# Patient Record
Sex: Female | Born: 1970
Health system: Southern US, Community
[De-identification: ages and names within clinical notes are randomized; demographics above are authoritative.]

## PROBLEM LIST (undated history)

## (undated) ENCOUNTER — Ambulatory Visit: Admission: EM | Source: Home / Self Care

## (undated) ENCOUNTER — Ambulatory Visit: Admission: EM | Payer: 59 | Source: Home / Self Care

## (undated) DIAGNOSIS — J45909 Unspecified asthma, uncomplicated: Secondary | ICD-10-CM

## (undated) DIAGNOSIS — E079 Disorder of thyroid, unspecified: Secondary | ICD-10-CM

## (undated) DIAGNOSIS — M1990 Unspecified osteoarthritis, unspecified site: Secondary | ICD-10-CM

## (undated) DIAGNOSIS — I10 Essential (primary) hypertension: Secondary | ICD-10-CM

## (undated) DIAGNOSIS — J449 Chronic obstructive pulmonary disease, unspecified: Secondary | ICD-10-CM

## (undated) DIAGNOSIS — F32A Depression, unspecified: Secondary | ICD-10-CM

## (undated) DIAGNOSIS — J439 Emphysema, unspecified: Secondary | ICD-10-CM

## (undated) DIAGNOSIS — R7303 Prediabetes: Secondary | ICD-10-CM

## (undated) DIAGNOSIS — E119 Type 2 diabetes mellitus without complications: Secondary | ICD-10-CM

## (undated) DIAGNOSIS — M329 Systemic lupus erythematosus, unspecified: Secondary | ICD-10-CM

## (undated) DIAGNOSIS — Z803 Family history of malignant neoplasm of breast: Secondary | ICD-10-CM

## (undated) DIAGNOSIS — IMO0002 Reserved for concepts with insufficient information to code with codable children: Secondary | ICD-10-CM

## (undated) DIAGNOSIS — M797 Fibromyalgia: Secondary | ICD-10-CM

## (undated) HISTORY — DX: Unspecified asthma, uncomplicated: J45.909

## (undated) HISTORY — DX: Fibromyalgia: M79.7

## (undated) HISTORY — DX: Type 2 diabetes mellitus without complications: E11.9

## (undated) HISTORY — DX: Emphysema, unspecified: J43.9

## (undated) HISTORY — DX: Depression, unspecified: F32.A

## (undated) HISTORY — PX: MOUTH SURGERY: SHX715

## (undated) HISTORY — DX: Family history of malignant neoplasm of breast: Z80.3

---

## 2015-10-11 DIAGNOSIS — E559 Vitamin D deficiency, unspecified: Secondary | ICD-10-CM | POA: Insufficient documentation

## 2018-03-13 DIAGNOSIS — M5412 Radiculopathy, cervical region: Secondary | ICD-10-CM | POA: Insufficient documentation

## 2019-02-10 DIAGNOSIS — M0579 Rheumatoid arthritis with rheumatoid factor of multiple sites without organ or systems involvement: Secondary | ICD-10-CM | POA: Insufficient documentation

## 2019-02-10 DIAGNOSIS — M797 Fibromyalgia: Secondary | ICD-10-CM | POA: Insufficient documentation

## 2019-02-10 DIAGNOSIS — M359 Systemic involvement of connective tissue, unspecified: Secondary | ICD-10-CM | POA: Insufficient documentation

## 2019-02-14 DIAGNOSIS — Z79899 Other long term (current) drug therapy: Secondary | ICD-10-CM | POA: Insufficient documentation

## 2019-04-29 ENCOUNTER — Emergency Department
Admission: EM | Admit: 2019-04-29 | Discharge: 2019-04-29 | Disposition: A | Discharge status: Home / Self Care | Payer: Medicare Other | Attending: Student in an Organized Health Care Education/Training Program | Admitting: Student in an Organized Health Care Education/Training Program

## 2019-04-29 ENCOUNTER — Other Ambulatory Visit: Payer: Self-pay

## 2019-04-29 ENCOUNTER — Encounter: Payer: Self-pay | Admitting: Emergency Medicine

## 2019-04-29 ENCOUNTER — Emergency Department: Payer: Medicare Other

## 2019-04-29 DIAGNOSIS — I10 Essential (primary) hypertension: Secondary | ICD-10-CM

## 2019-04-29 DIAGNOSIS — M9981 Other biomechanical lesions of cervical region: Secondary | ICD-10-CM | POA: Insufficient documentation

## 2019-04-29 DIAGNOSIS — J449 Chronic obstructive pulmonary disease, unspecified: Secondary | ICD-10-CM | POA: Diagnosis not present

## 2019-04-29 DIAGNOSIS — M79602 Pain in left arm: Secondary | ICD-10-CM | POA: Diagnosis not present

## 2019-04-29 DIAGNOSIS — F17200 Nicotine dependence, unspecified, uncomplicated: Secondary | ICD-10-CM | POA: Diagnosis not present

## 2019-04-29 DIAGNOSIS — R202 Paresthesia of skin: Secondary | ICD-10-CM | POA: Insufficient documentation

## 2019-04-29 DIAGNOSIS — M4802 Spinal stenosis, cervical region: Secondary | ICD-10-CM

## 2019-04-29 HISTORY — DX: Disorder of thyroid, unspecified: E07.9

## 2019-04-29 HISTORY — DX: Unspecified osteoarthritis, unspecified site: M19.90

## 2019-04-29 HISTORY — DX: Chronic obstructive pulmonary disease, unspecified: J44.9

## 2019-04-29 HISTORY — DX: Systemic lupus erythematosus, unspecified: M32.9

## 2019-04-29 HISTORY — DX: Reserved for concepts with insufficient information to code with codable children: IMO0002

## 2019-04-29 HISTORY — DX: Essential (primary) hypertension: I10

## 2019-04-29 LAB — CBC
HCT: 42.8 % (ref 36.0–46.0)
Hemoglobin: 13.9 g/dL (ref 12.0–15.0)
MCH: 30.8 pg (ref 26.0–34.0)
MCHC: 32.5 g/dL (ref 30.0–36.0)
MCV: 94.9 fL (ref 80.0–100.0)
Platelets: 345 10*3/uL (ref 150–400)
RBC: 4.51 MIL/uL (ref 3.87–5.11)
RDW: 14.1 % (ref 11.5–15.5)
WBC: 7.3 10*3/uL (ref 4.0–10.5)
nRBC: 0 % (ref 0.0–0.2)

## 2019-04-29 LAB — BASIC METABOLIC PANEL
Anion gap: 7 (ref 5–15)
BUN: 14 mg/dL (ref 6–20)
CO2: 24 mmol/L (ref 22–32)
Calcium: 9.1 mg/dL (ref 8.9–10.3)
Chloride: 109 mmol/L (ref 98–111)
Creatinine, Ser: 0.34 mg/dL — ABNORMAL LOW (ref 0.44–1.00)
GFR calc Af Amer: >60 mL/min (ref >60)
GFR calc non Af Amer: >60 mL/min (ref >60)
Glucose, Bld: 120 mg/dL — ABNORMAL HIGH (ref 70–99)
Potassium: 3.4 mmol/L — ABNORMAL LOW (ref 3.5–5.1)
Sodium: 140 mmol/L (ref 135–145)

## 2019-04-29 LAB — TROPONIN I (HIGH SENSITIVITY): Troponin I (High Sensitivity): 4 ng/L (ref <18)

## 2019-04-29 MED ORDER — SODIUM CHLORIDE 0.9% FLUSH: 3.0000 mL | Freq: Once | INTRAVENOUS | Status: DC

## 2019-04-29 MED ORDER — PREDNISONE 10 MG PO TABS: ORAL_TABLET | ORAL | 0 refills | Status: DC

## 2019-04-29 MED ORDER — LORAZEPAM 1 MG PO TABS
1.0000 mg | ORAL_TABLET | Freq: Once | ORAL | Status: AC
  Administered 2019-04-29: 1 mg via ORAL
  Filled 2019-04-29: qty 1

## 2019-04-29 MED ORDER — LORAZEPAM 1 MG PO TABS: 1.0000 mg | ORAL_TABLET | Freq: Three times a day (TID) | ORAL | 0 refills | Status: AC | PRN

## 2019-04-29 NOTE — ED Provider Notes (Signed)
[facility] Provider Note    First MD Initiated Contact with Patient 04/29/19 1143     (approximate)  I have reviewed the triage vital signs and the nursing notes.   HISTORY  Chief Complaint Hypertension    HPI Catherine Giles is a 48 y.o. female the below listed past medical history presents the ER for complaint and concern over elevated blood pressure as well as tingling and pain in her left hand that is been there for over 10 months.  States that she intermittently has some neck pain.  States she was recently started on losartan and has been having intermittent elevated blood pressure levels which is also concerning her..  She is also admitting to being very stressed out just recently having moved from [place]  And feels overwhelmed by her chronic medical conditions and adjusting to new home..  She denies any chest pain or pressure.  Does have some nausea from time to time.  Denies any abdominal pain.  She  Is established with rheumatology.    Past Medical History:  Diagnosis Date  . Arthritis    RA  . COPD (chronic obstructive pulmonary disease) ([place])   . Hypertension   . Lupus ([place])   . Thyroid disease    No family history on file. History reviewed. No pertinent surgical history. There are no active problems to display for this patient.     Prior to Admission medications   Medication Sig Start Date End Date Taking? Authorizing Provider  LORazepam (ATIVAN) 1 MG tablet Take 1 tablet (1 mg total) by mouth every 8 (eight) hours as needed for anxiety. 04/29/19 04/28/20  Merlyn Lot, MD  predniSONE (DELTASONE) 10 MG tablet Day 1-2: Take 50mg (5 pills) Day 3-4: Take 40mg (4) Day 5-6: 30mg (3) Day 7-8: 20mg (2) Day 9:10mg (1 04/29/19   Merlyn Lot, MD    Allergies Penicillins and Shellfish allergy    Social History Social History   Tobacco Use  . Smoking status: Current Every Day Smoker  . Smokeless tobacco: Never Used   Substance Use Topics  . Alcohol use: Not on file  . Drug use: Not on file    Review of Systems Patient denies headaches, rhinorrhea, blurry vision, numbness, shortness of breath, chest pain, edema, cough, abdominal pain, nausea, vomiting, diarrhea, dysuria, fevers, rashes or hallucinations unless otherwise stated above in HPI. ____________________________________________   PHYSICAL EXAM:  VITAL SIGNS: Vitals:   04/29/19 1030  BP: (!) 133/98  Pulse: 90  Resp: 18  Temp: 98.6 F (37 C)  SpO2: 99%    Constitutional: Alert and oriented.  Eyes: Conjunctivae are normal.  Head: Atraumatic. Nose: No congestion/rhinnorhea. Mouth/Throat: Mucous membranes are moist.   Neck: No stridor. Painless ROM.  Cardiovascular: Normal rate, regular rhythm. Grossly normal heart sounds.  Good peripheral circulation in all four extremities Respiratory: Normal respiratory effort.  No retractions. Lungs CTAB. Gastrointestinal: Soft and nontender. No distention. No abdominal bruits. No CVA tenderness. Genitourinary:  Musculoskeletal: No lower extremity tenderness nor edema.  No joint effusions. Neurologic:  CN- intact.  No facial droop, Normal FNF.  Normal heel to shin.  slightly decreased grip strenght on the left.Sensation intact bilaterally. Normal speech and language. No gross focal neurologic deficits are appreciated. No gait instability. Skin:  Skin is warm, dry and intact. No rash noted. Psychiatric: tearful and anxious appearing. Speech and behavior are normal.  ____________________________________________   LABS (all labs ordered are listed, but only abnormal results are displayed)  Results for orders  placed or performed during the Giles encounter of 04/29/19 (from the past 24 hour(s))  Basic metabolic panel     Status: Abnormal   Collection Time: 04/29/19 10:36 AM  Result Value Ref Range   Sodium 140 135 - 145 mmol/L   Potassium 3.4 (L) 3.5 - 5.1 mmol/L   Chloride 109 98 - 111 mmol/L    CO2 24 22 - 32 mmol/L   Glucose, Bld 120 (H) 70 - 99 mg/dL   BUN 14 6 - 20 mg/dL   Creatinine, Ser 0.34 (L) 0.44 - 1.00 mg/dL   Calcium 9.1 8.9 - 10.3 mg/dL   GFR calc non Af Amer >60 >60 mL/min   GFR calc Af Amer >60 >60 mL/min   Anion gap 7 5 - 15  CBC     Status: None   Collection Time: 04/29/19 10:36 AM  Result Value Ref Range   WBC 7.3 4.0 - 10.5 K/uL   RBC 4.51 3.87 - 5.11 MIL/uL   Hemoglobin 13.9 12.0 - 15.0 g/dL   HCT 42.8 36.0 - 46.0 %   MCV 94.9 80.0 - 100.0 fL   MCH 30.8 26.0 - 34.0 pg   MCHC 32.5 30.0 - 36.0 g/dL   RDW 14.1 11.5 - 15.5 %   Platelets 345 150 - 400 K/uL   nRBC 0.0 0.0 - 0.2 %  Troponin I (High Sensitivity)     Status: None   Collection Time: 04/29/19 10:36 AM  Result Value Ref Range   Troponin I (High Sensitivity) 4 <18 ng/L   ____________________________________________  EKG My review and personal interpretation at Time: 10:37   Indication: htn  Rate: 90  Rhythm: sinus Axis: normal Other: normal intervals, no stemi ____________________________________________  RADIOLOGY  I personally reviewed all radiographic images ordered to evaluate for the above acute complaints and reviewed radiology reports and findings.  These findings were personally discussed with the patient.  Please see medical record for radiology report.  ____________________________________________   PROCEDURES  Procedure(s) performed:  Procedures    Critical Care performed: no ____________________________________________   INITIAL IMPRESSION / ASSESSMENT AND PLAN / ED COURSE  Pertinent labs & imaging results that were available during my care of the patient were reviewed by me and considered in my medical decision making (see chart for details).   DDX: Radiculopathy, cervical strain, fibromyalgia, ACS, dissection, arthritis, hypertensive urgency, anxiety  Catherine Giles is a 48 y.o. who presents to the ED with symptoms as described above.  Patient well-appearing in  no acute distress but is very anxious and tearful.  Seems to be having quite a great deal of underlying stress at home.  EKG does not show any evidence of acute ischemia and her troponin is negative.  The symptoms all seem to be quite chronic in nature reporting symptoms started over 10 months ago but does not have a local PCP or primary care.  Is not consistent with dissection.  Given the radicular nature will order CT imaging to exclude any evidence of fracture as she did have intermittent neck pain and also evaluate for any significant stenosis.  Symptoms are otherwise fairly mild and would seem appropriate for outpatient follow-up assuming CT is reassuring.  Clinical Course as of Apr 29 1315  Tue Apr 29, 2019  1305 Reassessed.  Discussed the findings of imaging and results of her blood work.  Given duration of symptoms I believe she is appropriate for further work-up as an outpatient.  Does not have any evidence of acute  pathology.  We discussed signs and symptoms for which she should return to the ER.  Discussed appropriate follow-up.  Will start on steroid taper.  Had significant improvement with Ativan therefore will give small prescription for this.   [PR]    Clinical Course User Index [PR] Merlyn Lot, MD    The patient was evaluated in Emergency Department today for the symptoms described in the history of present illness. He/she was evaluated in the context of the global COVID-19 pandemic, which necessitated consideration that the patient might be at risk for infection with the SARS-CoV-2 virus that causes COVID-19. Institutional protocols and algorithms that pertain to the evaluation of patients at risk for COVID-19 are in a state of rapid change based on information released by regulatory bodies including the CDC and federal and state organizations. These policies and algorithms were followed during the patient's care in the ED.   As part of my medical decision making, I reviewed the  following data within the [facility] notes reviewed and incorporated, Labs reviewed, notes from prior ED visits and [place] Controlled Substance Database   ____________________________________________   FINAL CLINICAL IMPRESSION(S) / ED DIAGNOSES  Final diagnoses:  Neural foraminal stenosis of cervical spine  Hypertension, unspecified type  Left arm pain      NEW MEDICATIONS STARTED DURING THIS VISIT:  New Prescriptions   LORAZEPAM (ATIVAN) 1 MG TABLET    Take 1 tablet (1 mg total) by mouth every 8 (eight) hours as needed for anxiety.   PREDNISONE (DELTASONE) 10 MG TABLET    Day 1-2: Take 50mg (5 pills) Day 3-4: Take 40mg (4) Day 5-6: 30mg (3) Day 7-8: 20mg (2) Day 9:10mg (1     Note:  This document was prepared using Dragon voice recognition software and may include unintentional dictation errors.    Merlyn Lot, MD 04/29/19 1316

## 2019-04-29 NOTE — ED Triage Notes (Signed)
C/O hypertension 'for months"  Currently takes BP medication.  Last taken at 0500.   Patient is anxious.  AAOx3.  MAE equally and strong.    Also c/o left hand numbness and tingling x several years.

## 2019-04-29 NOTE — ED Notes (Signed)
Pt a/o, NAD. In 1H

## 2019-05-05 DIAGNOSIS — G8929 Other chronic pain: Secondary | ICD-10-CM | POA: Insufficient documentation

## 2019-05-05 DIAGNOSIS — M4722 Other spondylosis with radiculopathy, cervical region: Secondary | ICD-10-CM | POA: Insufficient documentation

## 2019-05-05 DIAGNOSIS — R200 Anesthesia of skin: Secondary | ICD-10-CM | POA: Insufficient documentation

## 2019-05-05 DIAGNOSIS — R202 Paresthesia of skin: Secondary | ICD-10-CM | POA: Insufficient documentation

## 2019-05-20 NOTE — Progress Notes (Signed)
Patient not available in online evisit platform at appt time, or for 20 minutes afterward.  Not able to reach her by phone either. No show.

## 2019-05-21 ENCOUNTER — Encounter: Payer: Medicare Other | Admitting: Family Medicine

## 2019-05-21 ENCOUNTER — Other Ambulatory Visit: Payer: Self-pay

## 2019-05-21 DIAGNOSIS — Z91199 Patient's noncompliance with other medical treatment and regimen due to unspecified reason: Secondary | ICD-10-CM

## 2019-05-21 DIAGNOSIS — Z5329 Procedure and treatment not carried out because of patient's decision for other reasons: Secondary | ICD-10-CM

## 2019-05-22 ENCOUNTER — Inpatient Hospital Stay: Payer: Medicare Other | Attending: Oncology | Admitting: Oncology

## 2019-05-28 ENCOUNTER — Encounter: Payer: Self-pay | Admitting: Oncology

## 2019-05-28 ENCOUNTER — Inpatient Hospital Stay: Payer: Medicare Other | Attending: Oncology | Admitting: Oncology

## 2019-05-28 ENCOUNTER — Other Ambulatory Visit: Payer: Self-pay

## 2019-05-28 ENCOUNTER — Inpatient Hospital Stay: Payer: Medicare Other

## 2019-05-28 VITALS — BP 160/104 | HR 80 | Temp 98.4°F | Resp 18 | Wt 167.1 lb

## 2019-05-28 DIAGNOSIS — Z809 Family history of malignant neoplasm, unspecified: Secondary | ICD-10-CM | POA: Diagnosis not present

## 2019-05-28 DIAGNOSIS — R1310 Dysphagia, unspecified: Secondary | ICD-10-CM | POA: Insufficient documentation

## 2019-05-28 DIAGNOSIS — M1990 Unspecified osteoarthritis, unspecified site: Secondary | ICD-10-CM

## 2019-05-28 DIAGNOSIS — M059 Rheumatoid arthritis with rheumatoid factor, unspecified: Secondary | ICD-10-CM | POA: Diagnosis not present

## 2019-05-28 DIAGNOSIS — Z880 Allergy status to penicillin: Secondary | ICD-10-CM | POA: Diagnosis not present

## 2019-05-28 DIAGNOSIS — R5382 Chronic fatigue, unspecified: Secondary | ICD-10-CM | POA: Insufficient documentation

## 2019-05-28 DIAGNOSIS — R779 Abnormality of plasma protein, unspecified: Secondary | ICD-10-CM | POA: Insufficient documentation

## 2019-05-28 DIAGNOSIS — Z801 Family history of malignant neoplasm of trachea, bronchus and lung: Secondary | ICD-10-CM | POA: Diagnosis not present

## 2019-05-28 DIAGNOSIS — Z833 Family history of diabetes mellitus: Secondary | ICD-10-CM | POA: Diagnosis not present

## 2019-05-28 DIAGNOSIS — M4050 Lordosis, unspecified, site unspecified: Secondary | ICD-10-CM | POA: Diagnosis not present

## 2019-05-28 DIAGNOSIS — I10 Essential (primary) hypertension: Secondary | ICD-10-CM | POA: Insufficient documentation

## 2019-05-28 DIAGNOSIS — Z79899 Other long term (current) drug therapy: Secondary | ICD-10-CM

## 2019-05-28 DIAGNOSIS — M542 Cervicalgia: Secondary | ICD-10-CM | POA: Insufficient documentation

## 2019-05-28 DIAGNOSIS — M2578 Osteophyte, vertebrae: Secondary | ICD-10-CM | POA: Insufficient documentation

## 2019-05-28 DIAGNOSIS — M2550 Pain in unspecified joint: Secondary | ICD-10-CM | POA: Insufficient documentation

## 2019-05-28 DIAGNOSIS — F17210 Nicotine dependence, cigarettes, uncomplicated: Secondary | ICD-10-CM | POA: Diagnosis not present

## 2019-05-28 DIAGNOSIS — D472 Monoclonal gammopathy: Secondary | ICD-10-CM | POA: Diagnosis not present

## 2019-05-28 DIAGNOSIS — Z8041 Family history of malignant neoplasm of ovary: Secondary | ICD-10-CM

## 2019-05-28 DIAGNOSIS — M797 Fibromyalgia: Secondary | ICD-10-CM

## 2019-05-28 DIAGNOSIS — R200 Anesthesia of skin: Secondary | ICD-10-CM | POA: Diagnosis not present

## 2019-05-28 DIAGNOSIS — Z8249 Family history of ischemic heart disease and other diseases of the circulatory system: Secondary | ICD-10-CM | POA: Diagnosis not present

## 2019-05-28 DIAGNOSIS — M069 Rheumatoid arthritis, unspecified: Secondary | ICD-10-CM | POA: Diagnosis not present

## 2019-05-28 DIAGNOSIS — R202 Paresthesia of skin: Secondary | ICD-10-CM

## 2019-05-28 NOTE — Progress Notes (Signed)
New patient in for abnormal plasma proteins.

## 2019-05-29 LAB — KAPPA/LAMBDA LIGHT CHAINS
Kappa free light chain: 14.4 mg/L (ref 3.3–19.4)
Kappa, lambda light chain ratio: 1.48 (ref 0.26–1.65)
Lambda free light chains: 9.7 mg/L (ref 5.7–26.3)

## 2019-05-29 LAB — BETA 2 MICROGLOBULIN, SERUM: Beta-2 Microglobulin: 1 mg/L (ref 0.6–2.4)

## 2019-05-29 NOTE — Progress Notes (Signed)
Hematology/Oncology Consult note [facility] [phone] [phone]   Patient Care Team: Patient, No Pcp Per as PCP - General (General Practice)  REFERRING PROVIDER: Behalal-Bock, Christele*  CHIEF COMPLAINTS/REASON FOR VISIT:  Evaluation of abnormalities of plasm protein.   HISTORY OF PRESENTING ILLNESS:  Catherine Giles is a  48 y.o.  female with PMH listed below was seen in consultation at the request of  Behalal-Bock, [place]ille*  for evaluation of abnormalities of plasm protein She followed up with Dr.Bock for rheumatoid arthritis, fibromyalgia, She has elevated CCP 62, elevated rheumatoid factor 123.  She was advised to start [place].   Negative CCP, ANA, normal C3,  Work up showed abnormal protein electrophoresis, IgA monoclonal protein with light chain. Normal urine protein electrophoresis. She was referred to heme-onc for further evaluation.  She reports having ongoing pain everywhere. She reports having ongoing pain everywhere.  Moved to [place] this year. Single mom, lives with kids.    Review of Systems  Constitutional: Positive for fatigue. Negative for appetite change, chills, fever and unexpected weight change.  HENT:   Negative for hearing loss and voice change.   Eyes: Negative for eye problems.  Respiratory: Negative for chest tightness, cough and shortness of breath.   Cardiovascular: Negative for chest pain.  Gastrointestinal: Negative for abdominal distention, abdominal pain and blood in stool.  Endocrine: Negative for hot flashes.  Genitourinary: Negative for difficulty urinating and frequency.   Musculoskeletal: Positive for arthralgias and myalgias.  Skin: Negative for itching and rash.  Neurological: Negative for extremity weakness.  Hematological: Negative for adenopathy.  Psychiatric/Behavioral: Negative for confusion and depression. The patient is nervous/anxious.     MEDICAL HISTORY:  Past Medical History:   Diagnosis Date  . Arthritis    RA  . COPD (chronic obstructive pulmonary disease) ([place])   . Emphysema of lung ([place])   . Hypertension   . Lupus ([place])   . Thyroid disease     SURGICAL HISTORY: History reviewed. No pertinent surgical history.  SOCIAL HISTORY: Social History   Socioeconomic History  . Marital status: Divorced    Spouse name: Not on file  . Number of children: Not on file  . Years of education: Not on file  . Highest education level: Not on file  Occupational History  . Not on file  Social Needs  . Financial resource strain: Not on file  . Food insecurity    Worry: Not on file    Inability: Not on file  . Transportation needs    Medical: Not on file    Non-medical: Not on file  Tobacco Use  . Smoking status: Current Every Day Smoker    Packs/day: 1.00    Years: 34.00    Pack years: 34.00    Types: Cigarettes  . Smokeless tobacco: Never Used  Substance and Sexual Activity  . Alcohol use: Yes  . Drug use: Never  . Sexual activity: Yes  Lifestyle  . Physical activity    Days per week: Not on file    Minutes per session: Not on file  . Stress: Not on file  Relationships  . Social Herbalist on phone: Not on file    Gets together: Not on file    Attends religious service: Not on file    Active member of club or organization: Not on file    Attends meetings of clubs or organizations: Not on file    Relationship status: Not on file  .  Intimate partner violence    Fear of current or ex partner: Not on file    Emotionally abused: Not on file    Physically abused: Not on file    Forced sexual activity: Not on file  Other Topics Concern  . Not on file  Social History Narrative  . Not on file    FAMILY HISTORY: Family History  Problem Relation Age of Onset  . Cancer Mother   . Lung cancer Mother   . Diabetes Mother   . Hypertension Father   . Cancer Maternal Aunt   . Ovarian cancer Maternal Aunt   . Diabetes Maternal Aunt   .  Diabetes Maternal Uncle   . Hypertension Maternal Grandmother   . Diabetes Maternal Grandmother     ALLERGIES:  is allergic to penicillins and shellfish allergy.  MEDICATIONS:  Current Outpatient Medications  Medication Sig Dispense Refill  . albuterol (VENTOLIN HFA) 108 (90 Base) MCG/ACT inhaler Inhale into the lungs.    . cyclobenzaprine (FLEXERIL) 10 MG tablet Take by mouth.    . hydroxychloroquine (PLAQUENIL) 200 MG tablet 1 tab BID, 90 days, #180 tabs    . ibuprofen (ADVIL) 600 MG tablet Take by mouth.    . leflunomide (ARAVA) 20 MG tablet Take by mouth.    . levothyroxine (SYNTHROID) 50 MCG tablet Take by mouth.    Marland Kitchen LORazepam (ATIVAN) 1 MG tablet Take 1 tablet (1 mg total) by mouth every 8 (eight) hours as needed for anxiety. 10 tablet 0  . losartan (COZAAR) 100 MG tablet Take by mouth.    . predniSONE (DELTASONE) 20 MG tablet Take 20 mg by mouth daily with breakfast.    . tiZANidine (ZANAFLEX) 4 MG tablet Take by mouth.    . umeclidinium-vilanterol (ANORO ELLIPTA) 62.5-25 MCG/INH AEPB Inhale into the lungs.     No current facility-administered medications for this visit.      PHYSICAL EXAMINATION: ECOG PERFORMANCE STATUS: 1 - Symptomatic but completely ambulatory Vitals:   05/28/19 1523  BP: (!) 160/104  Pulse: 80  Resp: 18  Temp: 98.4 F (36.9 C)  SpO2: 97%   Filed Weights   05/28/19 1523  Weight: 167 lb 1.6 oz (75.8 kg)    Physical Exam Constitutional:      General: She is not in acute distress. HENT:     Head: Normocephalic and atraumatic.  Eyes:     General: No scleral icterus.    Pupils: Pupils are equal, round, and reactive to light.  Neck:     Musculoskeletal: Normal range of motion and neck supple.  Cardiovascular:     Rate and Rhythm: Normal rate and regular rhythm.     Heart sounds: Normal heart sounds.  Pulmonary:     Effort: Pulmonary effort is normal. No respiratory distress.     Breath sounds: No wheezing.  Abdominal:     General: Bowel  sounds are normal. There is no distension.     Palpations: Abdomen is soft. There is no mass.     Tenderness: There is no abdominal tenderness.  Musculoskeletal: Normal range of motion.        General: No deformity.  Skin:    General: Skin is warm and dry.     Findings: No erythema or rash.  Neurological:     Mental Status: She is alert and oriented to person, place, and time.     Cranial Nerves: No cranial nerve deficit.     Coordination: Coordination normal.  Psychiatric:  Behavior: Behavior normal.        Thought Content: Thought content normal.     LABORATORY DATA:  I have reviewed the data as listed Lab Results  Component Value Date   WBC 7.3 04/29/2019   HGB 13.9 04/29/2019   HCT 42.8 04/29/2019   MCV 94.9 04/29/2019   PLT 345 04/29/2019   Recent Labs    04/29/19 1036  NA 140  K 3.4*  CL 109  CO2 24  GLUCOSE 120*  BUN 14  CREATININE 0.34*  CALCIUM 9.1  GFRNONAA >60  GFRAA >60   Iron/TIBC/Ferritin/ %Sat No results found for: IRON, TIBC, FERRITIN, IRONPCTSAT    RADIOGRAPHIC STUDIES: I have personally reviewed the radiological images as listed and agreed with the findings in the report. Dg Chest 2 View  Result Date: 04/29/2019 CLINICAL DATA:  Hypertension EXAM: CHEST - 2 VIEW COMPARISON:  None. FINDINGS: The heart size and mediastinal contours are within normal limits. Both lungs are clear. The visualized skeletal structures are unremarkable. IMPRESSION: No active cardiopulmonary disease. Electronically Signed   By: Kathreen Devoid   On: 04/29/2019 11:18   Ct Cervical Spine Wo Contrast  Result Date: 04/29/2019 CLINICAL DATA:  Neck pain and left hand tingling with some numbness at times. EXAM: CT CERVICAL SPINE WITHOUT CONTRAST TECHNIQUE: Multidetector CT imaging of the cervical spine was performed without intravenous contrast. Multiplanar CT image reconstructions were also generated. COMPARISON:  None. FINDINGS: Alignment: Reversal of cervical lordosis. Skull  base and vertebrae: No acute fracture. No primary bone lesion or focal pathologic process. Soft tissues and spinal canal: No prevertebral fluid or swelling. No visible canal hematoma. Disc levels: Mild multilevel osteoarthritic changes with disc space narrowing, remodeling of vertebral bodies and small disc osteophyte complexes. Mild bilateral osseous neural foraminal narrowing at C4-C5, C5-C6 and C6-C7. Upper chest: Mild paraseptal emphysema in the right lung apex. Other: None. IMPRESSION: 1. No evidence of acute traumatic injury to the cervical spine. 2. Reversal of cervical lordosis, likely positional. 3. Mild multilevel osteoarthritic changes with mild bilateral osseous neural foraminal narrowing at C4-C5, C5-C6 and C6-C7. Electronically Signed   By: Fidela Salisbury M.D.   On: 04/29/2019 12:35     ASSESSMENT & PLAN:  1. Abnormal plasma protein test    Labs are reviewed and discussed with patient. No M protein detected. IFE showed monoclonal IgA protein. ? MGUS, need to confirm. I suggest to repeat multiple myeloma panel with serum protein electrolyte pheresis, light chain ratio, beta 2 microglobulin. Patient does not have primary care provider.  Will refer her to establish care with PCP.  Orders Placed This Encounter  Procedures  . Kappa/lambda light chains    Standing Status:   Future    Number of Occurrences:   1    Standing Expiration Date:   05/27/2020  . Beta 2 microglobulin, serum    Standing Status:   Future    Number of Occurrences:   1    Standing Expiration Date:   05/27/2020  . Multiple Myeloma Panel (SPEP&IFE w/QIG)    Standing Status:   Future    Number of Occurrences:   1    Standing Expiration Date:   05/27/2020    All questions were answered. The patient knows to call the clinic with any problems questions or concerns.  cc Behalal-Bock, Christele*    Return of visit:  Thank you for this kind referral and the opportunity to participate in the care of this patient.  A copy of  today's note is routed to referring provider  Total face to face encounter time for this patient visit was 45 min. >50% of the time was  spent in counseling and coordination of care.    Earlie Server, MD, PhD Hematology Oncology [facility] at [facility] Pager- [phone] 05/29/2019

## 2019-05-30 LAB — MULTIPLE MYELOMA PANEL, SERUM
Albumin SerPl Elph-Mcnc: 3.6 g/dL (ref 2.9–4.4)
Albumin/Glob SerPl: 1.3 (ref 0.7–1.7)
Alpha 1: 0.2 g/dL (ref 0.0–0.4)
Alpha2 Glob SerPl Elph-Mcnc: 0.8 g/dL (ref 0.4–1.0)
B-Globulin SerPl Elph-Mcnc: 1.2 g/dL (ref 0.7–1.3)
Gamma Glob SerPl Elph-Mcnc: 0.8 g/dL (ref 0.4–1.8)
Globulin, Total: 3 g/dL (ref 2.2–3.9)
IgA: 350 mg/dL (ref 87–352)
IgG (Immunoglobin G), Serum: 908 mg/dL (ref 586–1602)
IgM (Immunoglobulin M), Srm: 65 mg/dL (ref 26–217)
Total Protein ELP: 6.6 g/dL (ref 6.0–8.5)

## 2019-06-13 ENCOUNTER — Encounter: Payer: Self-pay | Admitting: Oncology

## 2019-06-13 ENCOUNTER — Inpatient Hospital Stay (HOSPITAL_BASED_OUTPATIENT_CLINIC_OR_DEPARTMENT_OTHER): Payer: Medicare Other | Admitting: Oncology

## 2019-06-13 ENCOUNTER — Ambulatory Visit: Payer: Medicare Other | Admitting: Oncology

## 2019-06-13 ENCOUNTER — Other Ambulatory Visit: Payer: Self-pay

## 2019-06-13 VITALS — BP 135/87 | HR 90 | Temp 96.9°F | Wt 164.1 lb

## 2019-06-13 DIAGNOSIS — M059 Rheumatoid arthritis with rheumatoid factor, unspecified: Secondary | ICD-10-CM | POA: Diagnosis not present

## 2019-06-13 DIAGNOSIS — Z0000 Encounter for general adult medical examination without abnormal findings: Secondary | ICD-10-CM | POA: Diagnosis not present

## 2019-06-13 DIAGNOSIS — D472 Monoclonal gammopathy: Secondary | ICD-10-CM | POA: Diagnosis not present

## 2019-06-13 DIAGNOSIS — R1310 Dysphagia, unspecified: Secondary | ICD-10-CM

## 2019-06-13 NOTE — Progress Notes (Signed)
Hematology/Oncology Consult note [facility] Telephone:([phone] [phone]   Patient Care Team: Patient, No Pcp Per as PCP - General (General Practice)  REFERRING PROVIDER: Behalal-Bock, Christele*  CHIEF COMPLAINTS/REASON FOR VISIT:  Evaluation of abnormalities of plasm protein.   HISTORY OF PRESENTING ILLNESS:  Catherine Giles is a  48 y.o.  female with PMH listed below was seen in consultation at the request of  Behalal-Bock, [place]*  for evaluation of abnormalities of plasm protein She followed up with Dr.Bock for rheumatoid arthritis, fibromyalgia, She has elevated CCP 62, elevated rheumatoid factor 123.  She was advised to start J[place]uld.   Negative CCP, ANA, normal C3,  Work up showed abnormal protein electrophoresis, IgA monoclonal protein with light chain. Normal urine protein electrophoresis. She was referred to heme-onc for further evaluation.  She reports having ongoing pain everywhere. She reports having ongoing pain everywhere.  Moved to [place] this year. Single mom, lives with kids.   INTERVAL HISTORY Catherine Giles is a 48 y.o. female who has above history reviewed by me today presents for follow up visit for management of abnormal protein electrophoresis results. Problems and complaints are listed below: Chronic fatigue, joint pain and muscle pain, unchanged. She also reported having pain when she swallows, mid chest spasm pain.  No exacerbating or alleviating factors. She desires to establish care with primary care physician in [place] and we referred her at last visit.  She reports that she lost her contact number and has not called.   Review of Systems  Constitutional: Positive for fatigue. Negative for appetite change, chills, fever and unexpected weight change.  HENT:   Negative for hearing loss and voice change.   Eyes: Negative for eye problems.  Respiratory: Negative for chest tightness, cough and shortness of breath.    Cardiovascular: Negative for chest pain.  Gastrointestinal: Negative for abdominal distention, abdominal pain and blood in stool.  Endocrine: Negative for hot flashes.  Genitourinary: Negative for difficulty urinating and frequency.   Musculoskeletal: Positive for arthralgias and myalgias.  Skin: Negative for itching and rash.  Neurological: Negative for extremity weakness.  Hematological: Negative for adenopathy.  Psychiatric/Behavioral: Negative for confusion and depression. The patient is nervous/anxious.     MEDICAL HISTORY:  Past Medical History:  Diagnosis Date  . Arthritis    RA  . COPD (chronic obstructive pulmonary disease) ([place])   . Emphysema of lung ([place])   . Hypertension   . Lupus ([place])   . Thyroid disease     SURGICAL HISTORY: History reviewed. No pertinent surgical history.  SOCIAL HISTORY: Social History   Socioeconomic History  . Marital status: Divorced    Spouse name: Not on file  . Number of children: Not on file  . Years of education: Not on file  . Highest education level: Not on file  Occupational History  . Not on file  Social Needs  . Financial resource strain: Not on file  . Food insecurity    Worry: Not on file    Inability: Not on file  . Transportation needs    Medical: Not on file    Non-medical: Not on file  Tobacco Use  . Smoking status: Current Every Day Smoker    Packs/day: 1.00    Years: 34.00    Pack years: 34.00    Types: Cigarettes  . Smokeless tobacco: Never Used  Substance and Sexual Activity  . Alcohol use: Yes  . Drug use: Never  . Sexual activity: Yes  Lifestyle  .  Physical activity    Days per week: Not on file    Minutes per session: Not on file  . Stress: Not on file  Relationships  . Social Herbalist on phone: Not on file    Gets together: Not on file    Attends religious service: Not on file    Active member of club or organization: Not on file    Attends meetings of clubs or organizations:  Not on file    Relationship status: Not on file  . Intimate partner violence    Fear of current or ex partner: Not on file    Emotionally abused: Not on file    Physically abused: Not on file    Forced sexual activity: Not on file  Other Topics Concern  . Not on file  Social History Narrative  . Not on file    FAMILY HISTORY: Family History  Problem Relation Age of Onset  . Cancer Mother   . Lung cancer Mother   . Diabetes Mother   . Hypertension Father   . Cancer Maternal Aunt   . Ovarian cancer Maternal Aunt   . Diabetes Maternal Aunt   . Diabetes Maternal Uncle   . Hypertension Maternal Grandmother   . Diabetes Maternal Grandmother     ALLERGIES:  is allergic to penicillins and shellfish allergy.  MEDICATIONS:  Current Outpatient Medications  Medication Sig Dispense Refill  . albuterol (VENTOLIN HFA) 108 (90 Base) MCG/ACT inhaler Inhale into the lungs.    . cyclobenzaprine (FLEXERIL) 10 MG tablet Take by mouth.    . hydroxychloroquine (PLAQUENIL) 200 MG tablet 1 tab BID, 90 days, #180 tabs    . ibuprofen (ADVIL) 600 MG tablet Take by mouth.    . leflunomide (ARAVA) 20 MG tablet Take by mouth.    . levothyroxine (SYNTHROID) 50 MCG tablet Take by mouth.    Marland Kitchen LORazepam (ATIVAN) 1 MG tablet Take 1 tablet (1 mg total) by mouth every 8 (eight) hours as needed for anxiety. 10 tablet 0  . losartan (COZAAR) 100 MG tablet Take by mouth.    . predniSONE (DELTASONE) 20 MG tablet Take 20 mg by mouth daily with breakfast.    . tiZANidine (ZANAFLEX) 4 MG tablet Take by mouth.    . umeclidinium-vilanterol (ANORO ELLIPTA) 62.5-25 MCG/INH AEPB Inhale into the lungs.     No current facility-administered medications for this visit.      PHYSICAL EXAMINATION: ECOG PERFORMANCE STATUS: 1 - Symptomatic but completely ambulatory Vitals:   06/13/19 1503  BP: 135/87  Pulse: 90  Temp: (!) 96.9 F (36.1 C)   Filed Weights   06/13/19 1503  Weight: 164 lb 2 oz (74.4 kg)    Physical  Exam Constitutional:      General: She is not in acute distress. HENT:     Head: Normocephalic and atraumatic.  Eyes:     General: No scleral icterus.    Pupils: Pupils are equal, round, and reactive to light.  Neck:     Musculoskeletal: Normal range of motion and neck supple.  Cardiovascular:     Rate and Rhythm: Normal rate and regular rhythm.     Heart sounds: Normal heart sounds.  Pulmonary:     Effort: Pulmonary effort is normal. No respiratory distress.     Breath sounds: No wheezing.  Abdominal:     General: Bowel sounds are normal. There is no distension.     Palpations: Abdomen is soft. There is no  mass.     Tenderness: There is no abdominal tenderness.  Musculoskeletal: Normal range of motion.        General: No deformity.  Skin:    General: Skin is warm and dry.     Findings: No erythema or rash.  Neurological:     Mental Status: She is alert and oriented to person, place, and time.     Cranial Nerves: No cranial nerve deficit.     Coordination: Coordination normal.  Psychiatric:        Behavior: Behavior normal.        Thought Content: Thought content normal.     LABORATORY DATA:  I have reviewed the data as listed Lab Results  Component Value Date   WBC 7.3 04/29/2019   HGB 13.9 04/29/2019   HCT 42.8 04/29/2019   MCV 94.9 04/29/2019   PLT 345 04/29/2019   Recent Labs    04/29/19 1036  NA 140  K 3.4*  CL 109  CO2 24  GLUCOSE 120*  BUN 14  CREATININE 0.34*  CALCIUM 9.1  GFRNONAA >60  GFRAA >60   Iron/TIBC/Ferritin/ %Sat No results found for: IRON, TIBC, FERRITIN, IRONPCTSAT    RADIOGRAPHIC STUDIES: I have personally reviewed the radiological images as listed and agreed with the findings in the report. Dg Chest 2 View  Result Date: 04/29/2019 CLINICAL DATA:  Hypertension EXAM: CHEST - 2 VIEW COMPARISON:  None. FINDINGS: The heart size and mediastinal contours are within normal limits. Both lungs are clear. The visualized skeletal structures  are unremarkable. IMPRESSION: No active cardiopulmonary disease. Electronically Signed   By: Kathreen Devoid   On: 04/29/2019 11:18   Ct Cervical Spine Wo Contrast  Result Date: 04/29/2019 CLINICAL DATA:  Neck pain and left hand tingling with some numbness at times. EXAM: CT CERVICAL SPINE WITHOUT CONTRAST TECHNIQUE: Multidetector CT imaging of the cervical spine was performed without intravenous contrast. Multiplanar CT image reconstructions were also generated. COMPARISON:  None. FINDINGS: Alignment: Reversal of cervical lordosis. Skull base and vertebrae: No acute fracture. No primary bone lesion or focal pathologic process. Soft tissues and spinal canal: No prevertebral fluid or swelling. No visible canal hematoma. Disc levels: Mild multilevel osteoarthritic changes with disc space narrowing, remodeling of vertebral bodies and small disc osteophyte complexes. Mild bilateral osseous neural foraminal narrowing at C4-C5, C5-C6 and C6-C7. Upper chest: Mild paraseptal emphysema in the right lung apex. Other: None. IMPRESSION: 1. No evidence of acute traumatic injury to the cervical spine. 2. Reversal of cervical lordosis, likely positional. 3. Mild multilevel osteoarthritic changes with mild bilateral osseous neural foraminal narrowing at C4-C5, C5-C6 and C6-C7. Electronically Signed   By: Fidela Salisbury M.D.   On: 04/29/2019 12:35     ASSESSMENT & PLAN:  1. IgA monoclonal gammopathy of uncertain significance   2. Odynophagia   3. Rheumatoid arthritis with positive rheumatoid factor, involving unspecified site ([place])   4. Routine adult health maintenance    Labs reviewed and discussed with patient again. Confirmed monoclonal IgA protein with kappa light chain restriction. No detectable M protein.  Free light chain ratio is also normal. Possible very early phase of MGUS.  Other etiology includes lymphoma. Recommend continue observation. Repeat blood work in 1 year.  Patient does not have primary  care provider.  Will refer her to establish care with PCP. PCP information was provided to patient again.  Odynophagia, etiology unknown.  Refer to GI for further evaluation. She is 48 years old, need to also start  with screening colonoscopy.  Refer to GI Rheumatoid arthritis, continue follow-up with rheumatology.  Currently taking Plaquenil and Arava.  Fibromyalgia, on Cymbalta.  Managed by primary care physician.  Orders Placed This Encounter  Procedures  . CBC with Differential/Platelet    Standing Status:   Future    Standing Expiration Date:   06/12/2020  . Comprehensive metabolic panel    Standing Status:   Future    Standing Expiration Date:   06/12/2020  . Kappa/lambda light chains    Standing Status:   Future    Standing Expiration Date:   06/12/2020  . Multiple Myeloma Panel (SPEP&IFE w/QIG)    Standing Status:   Future    Standing Expiration Date:   06/12/2020  . Ambulatory referral to Gastroenterology    Referral Priority:   Routine    Referral Type:   Consultation    Referral Reason:   Specialty Services Required    Referred to Provider:   Jonathon Bellows, MD    Number of Visits Requested:   1    All questions were answered. The patient knows to call the clinic with any problems questions or concerns.  cc Behalal-Bock, Christele*    Return of visit: 1 year  Earlie Server, MD, PhD Hematology Oncology [facility] at [facility] Pager- [phone] 06/13/2019

## 2019-06-13 NOTE — Progress Notes (Signed)
Patient here today to follow up on initial consult visit and labs from 2 weeks ago.

## 2019-06-27 ENCOUNTER — Ambulatory Visit: Payer: Medicare Other | Admitting: Family Medicine

## 2019-07-09 ENCOUNTER — Other Ambulatory Visit: Payer: Self-pay | Admitting: Internal Medicine

## 2019-07-09 DIAGNOSIS — M5416 Radiculopathy, lumbar region: Secondary | ICD-10-CM

## 2019-07-16 ENCOUNTER — Ambulatory Visit: Payer: Medicare Other | Admitting: Family Medicine

## 2019-07-18 ENCOUNTER — Ambulatory Visit: Admission: RE | Admit: 2019-07-18 | Payer: Medicare Other | Source: Ambulatory Visit

## 2019-07-27 ENCOUNTER — Other Ambulatory Visit: Payer: Self-pay

## 2019-07-27 ENCOUNTER — Ambulatory Visit
Admission: RE | Admit: 2019-07-27 | Discharge: 2019-07-27 | Disposition: A | Discharge status: Home / Self Care | Payer: Medicare Other | Source: Ambulatory Visit | Attending: Internal Medicine | Admitting: Internal Medicine

## 2019-07-27 DIAGNOSIS — M5416 Radiculopathy, lumbar region: Secondary | ICD-10-CM | POA: Diagnosis not present

## 2019-07-31 ENCOUNTER — Ambulatory Visit: Payer: Medicare Other | Admitting: Gastroenterology

## 2019-07-31 NOTE — Progress Notes (Deleted)
Jonathon Bellows MD, MRCP(U.K) [address]  [place], [place] [address]  Main: [phone]  Fax: [phone]   Gastroenterology Consultation  Referring Provider:     Earlie Server, MD Primary Care Physician:  Patient, No Pcp Per Primary Gastroenterologist:  Dr. Jonathon Bellows  Reason for Consultation:     Odynophagia        HPI:   Catherine Giles is a 48 y.o. y/o female referred for odynophagia    Past Medical History:  Diagnosis Date  . Arthritis    RA  . COPD (chronic obstructive pulmonary disease) ([place])   . Emphysema of lung ([place])   . Hypertension   . Lupus ([place])   . Thyroid disease     No past surgical history on file.  Prior to Admission medications   Medication Sig Start Date End Date Taking? Authorizing Provider  albuterol (VENTOLIN HFA) 108 (90 Base) MCG/ACT inhaler Inhale into the lungs. 02/12/19 02/12/20  [provider]  cyclobenzaprine (FLEXERIL) 10 MG tablet Take by mouth.    [provider]  hydroxychloroquine (PLAQUENIL) 200 MG tablet 1 tab BID, 90 days, #180 tabs 02/14/19   [provider]  ibuprofen (ADVIL) 600 MG tablet Take by mouth.    [provider]  leflunomide (ARAVA) 20 MG tablet Take by mouth. 05/05/19 05/04/20  [provider]  levothyroxine (SYNTHROID) 50 MCG tablet Take by mouth.    [provider]  LORazepam (ATIVAN) 1 MG tablet Take 1 tablet (1 mg total) by mouth every 8 (eight) hours as needed for anxiety. 04/29/19 04/28/20  Merlyn Lot, MD  losartan (COZAAR) 100 MG tablet Take by mouth.    [provider]  predniSONE (DELTASONE) 20 MG tablet Take 20 mg by mouth daily with breakfast.    [provider]  tiZANidine (ZANAFLEX) 4 MG tablet Take by mouth.    [provider]  umeclidinium-vilanterol (ANORO ELLIPTA) 62.5-25 MCG/INH AEPB Inhale into the lungs. 02/12/19   [provider]    Family History  Problem Relation Age of Onset  . Cancer Mother    . Lung cancer Mother   . Diabetes Mother   . Hypertension Father   . Cancer Maternal Aunt   . Ovarian cancer Maternal Aunt   . Diabetes Maternal Aunt   . Diabetes Maternal Uncle   . Hypertension Maternal Grandmother   . Diabetes Maternal Grandmother      Social History   Tobacco Use  . Smoking status: Current Every Day Smoker    Packs/day: 1.00    Years: 34.00    Pack years: 34.00    Types: Cigarettes  . Smokeless tobacco: Never Used  Substance Use Topics  . Alcohol use: Yes  . Drug use: Never    Allergies as of 07/31/2019 - Review Complete 06/13/2019  Allergen Reaction Noted  . Penicillins  04/29/2019  . Shellfish allergy  04/29/2019    Review of Systems:    All systems reviewed and negative except where noted in HPI.   Physical Exam:  There were no vitals taken for this visit. No LMP recorded. (Menstrual status: Perimenopausal). Psych:  Alert and cooperative. Normal mood and affect. General:   Alert,  Well-developed, well-nourished, pleasant and cooperative in NAD Head:  Normocephalic and atraumatic. Eyes:  Sclera clear, no icterus.   Conjunctiva pink. Ears:  Normal auditory acuity. Nose:  No deformity, discharge, or lesions. Mouth:  No deformity or lesions,oropharynx pink & moist. Neck:  Supple; no masses or  thyromegaly. Lungs:  Respirations even and unlabored.  Clear throughout to auscultation.   No wheezes, crackles, or rhonchi. No acute distress. Heart:  Regular rate and rhythm; no murmurs, clicks, rubs, or gallops. Abdomen:  Normal bowel sounds.  No bruits.  Soft, non-tender and non-distended without masses, hepatosplenomegaly or hernias noted.  No guarding or rebound tenderness.    Neurologic:  Alert and oriented x3;  grossly normal neurologically. Skin:  Intact without significant lesions or rashes. No jaundice. Lymph Nodes:  No significant cervical adenopathy. Psych:  Alert and cooperative. Normal mood and affect.  Imaging Studies: Mr Lumbar Spine Wo  Contrast  Result Date: 07/28/2019 CLINICAL DATA:  Low back pain radiating to the left hip and left leg EXAM: MRI LUMBAR SPINE WITHOUT CONTRAST TECHNIQUE: Multiplanar, multisequence MR imaging of the lumbar spine was performed. No intravenous contrast was administered. COMPARISON:  None. FINDINGS: Segmentation:  Standard. Alignment:  Grade 1 L5-S1 anterolisthesis Vertebrae:  No fracture, evidence of discitis, or bone lesion. Conus medullaris and cauda equina: Conus extends to the L2 level. Conus and cauda equina appear normal. Paraspinal and other soft tissues: Negative. Disc levels: The T12-L5 disc spaces are normal. L5-S1: Moderate facet hypertrophy with small left subarticular disc protrusion that narrows the left lateral recess and causes mild posterior displacement of the descending left S1 nerve root. No central spinal canal stenosis or neural foraminal stenosis. IMPRESSION: 1. L5-S1 left subarticular disc protrusion narrowing the left lateral recess and causing mild posterior displacement of the descending left S1 nerve root. Correlate for left S1 radiculopathy. 2. Otherwise normal lumbar spine. Electronically Signed   By: Ulyses Jarred M.D.   On: 07/28/2019 03:08    Assessment and Plan:   Catherine Giles is a 48 y.o. y/o female has been referred for odynophagia  Follow up in ***  Dr Jonathon Bellows MD,MRCP(U.K)

## 2019-08-14 DIAGNOSIS — E042 Nontoxic multinodular goiter: Secondary | ICD-10-CM | POA: Insufficient documentation

## 2019-08-26 DIAGNOSIS — M5136 Other intervertebral disc degeneration, lumbar region: Secondary | ICD-10-CM | POA: Insufficient documentation

## 2019-09-17 ENCOUNTER — Ambulatory Visit: Payer: Medicare Other | Admitting: Gastroenterology

## 2019-09-22 ENCOUNTER — Encounter: Payer: Self-pay | Admitting: Gastroenterology

## 2019-11-05 ENCOUNTER — Ambulatory Visit: Payer: Medicare Other | Admitting: Family Medicine

## 2019-11-07 ENCOUNTER — Other Ambulatory Visit: Payer: Self-pay

## 2019-11-07 ENCOUNTER — Emergency Department
Admission: EM | Admit: 2019-11-07 | Discharge: 2019-11-07 | Disposition: A | Discharge status: Home / Self Care | Payer: Medicare Other | Attending: Emergency Medicine | Admitting: Emergency Medicine

## 2019-11-07 DIAGNOSIS — Z5321 Procedure and treatment not carried out due to patient leaving prior to being seen by health care provider: Secondary | ICD-10-CM | POA: Insufficient documentation

## 2019-11-07 DIAGNOSIS — R109 Unspecified abdominal pain: Secondary | ICD-10-CM | POA: Diagnosis not present

## 2019-11-07 DIAGNOSIS — R112 Nausea with vomiting, unspecified: Secondary | ICD-10-CM | POA: Diagnosis not present

## 2019-11-07 LAB — CBC
HCT: 41.3 % (ref 36.0–46.0)
Hemoglobin: 13.4 g/dL (ref 12.0–15.0)
MCH: 31.1 pg (ref 26.0–34.0)
MCHC: 32.4 g/dL (ref 30.0–36.0)
MCV: 95.8 fL (ref 80.0–100.0)
Platelets: 352 10*3/uL (ref 150–400)
RBC: 4.31 MIL/uL (ref 3.87–5.11)
RDW: 14.5 % (ref 11.5–15.5)
WBC: 10.5 10*3/uL (ref 4.0–10.5)
nRBC: 0 % (ref 0.0–0.2)

## 2019-11-07 LAB — COMPREHENSIVE METABOLIC PANEL
ALT: 17 U/L (ref 0–44)
AST: 16 U/L (ref 15–41)
Albumin: 3.6 g/dL (ref 3.5–5.0)
Alkaline Phosphatase: 66 U/L (ref 38–126)
Anion gap: 6 (ref 5–15)
BUN: 16 mg/dL (ref 6–20)
CO2: 24 mmol/L (ref 22–32)
Calcium: 8.4 mg/dL — ABNORMAL LOW (ref 8.9–10.3)
Chloride: 111 mmol/L (ref 98–111)
Creatinine, Ser: 0.55 mg/dL (ref 0.44–1.00)
GFR calc Af Amer: >60 mL/min (ref >60)
GFR calc non Af Amer: >60 mL/min (ref >60)
Glucose, Bld: 137 mg/dL — ABNORMAL HIGH (ref 70–99)
Potassium: 3.8 mmol/L (ref 3.5–5.1)
Sodium: 141 mmol/L (ref 135–145)
Total Bilirubin: 0.4 mg/dL (ref 0.3–1.2)
Total Protein: 7.2 g/dL (ref 6.5–8.1)

## 2019-11-07 LAB — LIPASE, BLOOD: Lipase: 23 U/L (ref 11–51)

## 2019-11-07 LAB — TROPONIN I (HIGH SENSITIVITY): Troponin I (High Sensitivity): 2 ng/L (ref <18)

## 2019-11-07 MED ORDER — ONDANSETRON 4 MG PO TBDP
4.0000 mg | ORAL_TABLET | Freq: Once | ORAL | Status: AC | PRN
  Administered 2019-11-07: 4 mg via ORAL

## 2019-11-07 MED ORDER — ONDANSETRON 4 MG PO TBDP
ORAL_TABLET | ORAL | Status: AC
  Filled 2019-11-07: qty 1

## 2019-11-07 MED ORDER — ONDANSETRON HCL 4 MG/2ML IJ SOLN
4.0000 mg | Freq: Once | INTRAMUSCULAR | Status: AC
  Administered 2019-11-07: 4 mg via INTRAVENOUS
  Filled 2019-11-07: qty 2

## 2019-11-07 NOTE — ED Notes (Signed)
Pt offered GI cocktail for pain, pt refused.

## 2019-11-07 NOTE — ED Notes (Signed)
Pt to ED lobby via w/c, brought in by EMS from home; c/o abd pain after eating chinese food for dinner accomp by N/V

## 2019-11-07 NOTE — ED Notes (Signed)
Pt ambulatory to STAT desk to inquire over wait time, no difficulty or distress noted; updated on such and voices understanding

## 2019-11-07 NOTE — ED Notes (Addendum)
Pt stated that she is "being ignored" because she is not getting pain meds.  Pt is being loud and disruptive at this moment.  Pt feels that she is being treated unfairly compared to the rest of the pts.  Says that "others are going back before me" referring to those going back for triage.  This tech has explained that there are no available beds at this time, and the first nurse has been made aware of her pain

## 2019-11-07 NOTE — ED Notes (Signed)
After re-eval of patient , medicated pt with anti-nausea med Zofran , pt has now decided to leave with out seeing a EDP. Pt does state she is feeling better and does not wish to stay any longer.  Pt is calm /cooperative , no signs of distress

## 2019-11-07 NOTE — ED Triage Notes (Signed)
Pt in with co right sided pain that started today after eating. Pt states radiates from epigastric area to right abd. Pt did not take any otc meds at home. Pt denies any n.v.d. or dysuria.

## 2019-11-07 NOTE — ED Notes (Signed)
Pt ambulatory to STAT desk without difficulty or distress noted; pt is upset over wait time; pt informed that she will go to an exam room as soon as one is available; pt cont to be upset st "you gotta walk in here and act like you crazy in order to get straight back to a room!"

## 2019-11-07 NOTE — ED Notes (Signed)
Pt resting quietly in lobby with no distress noted

## 2019-12-02 ENCOUNTER — Ambulatory Visit: Payer: Self-pay | Admitting: *Deleted

## 2019-12-02 ENCOUNTER — Ambulatory Visit: Payer: Medicare Other

## 2019-12-02 ENCOUNTER — Other Ambulatory Visit: Payer: Self-pay

## 2019-12-02 ENCOUNTER — Ambulatory Visit
Admission: EM | Admit: 2019-12-02 | Discharge: 2019-12-02 | Disposition: A | Discharge status: Home / Self Care | Payer: Medicare Other | Attending: Family Medicine | Admitting: Family Medicine

## 2019-12-02 DIAGNOSIS — J069 Acute upper respiratory infection, unspecified: Secondary | ICD-10-CM | POA: Diagnosis not present

## 2019-12-02 DIAGNOSIS — Z7189 Other specified counseling: Secondary | ICD-10-CM | POA: Diagnosis present

## 2019-12-02 DIAGNOSIS — Z20822 Contact with and (suspected) exposure to covid-19: Secondary | ICD-10-CM | POA: Diagnosis present

## 2019-12-02 MED ORDER — HYDROCOD POLST-CPM POLST ER 10-8 MG/5ML PO SUER: 5.0000 mL | Freq: Two times a day (BID) | ORAL | 0 refills | Status: DC | PRN

## 2019-12-02 MED ORDER — IPRATROPIUM-ALBUTEROL 0.5-2.5 (3) MG/3ML IN SOLN: 3.0000 mL | Freq: Four times a day (QID) | RESPIRATORY_TRACT | 0 refills | Status: DC | PRN

## 2019-12-02 NOTE — ED Triage Notes (Signed)
Patient complains of cough, congestion, chills, body aches x 4 days.

## 2019-12-02 NOTE — Telephone Encounter (Signed)
Patient is calling for NP appointment- patient states he BP keeps going up- she has BP medication- but when her BP gets normal she stops it because she is afraid her BP is going to bottom out. Explained to patient- the BP medication bring her BP down to normal level and will keep it there if she continues to take it- she can't keep stopping and starting. Patient is also coughing very bad and she states EMS can and gave her breathing treatments last night. Advised patient to go to UC today and then call office tomorrow to schedule a NP/UC follow up appointment.  Reason for Disposition . Systolic BP  >= 0000000 OR Diastolic >= [id number]  Answer Assessment - Initial Assessment Questions 1. BLOOD PRESSURE: "What is the blood pressure?" "Did you take at least two measurements 5 minutes apart?"     168/100 P 77, 113/100 P 91 2. ONSET: "When did you take your blood pressure?"     1:55 3. HOW: "How did you obtain the blood pressure?" (e.g., visiting nurse, automatic home BP monitor)     automatic cuff- arm 4. HISTORY: "Do you have a history of high blood pressure?"     yes 5. MEDICATIONS: "Are you taking any medications for blood pressure?" "Have you missed any doses recently?"     Yes, yes- patient takes irregularly 6. OTHER SYMPTOMS: "Do you have any symptoms?" (e.g., headache, chest pain, blurred vision, difficulty breathing, weakness)     Patient gets headaches and nausea 7. PREGNANCY: "Is there any chance you are pregnant?" "When was your last menstrual period?"     n/a  Protocols used: HIGH BLOOD PRESSURE-A-AH

## 2019-12-02 NOTE — Discharge Instructions (Addendum)
It was very nice seeing you today in clinic. Thank you for entrusting me with your care.   Rest and stay HYDRATED. Water and electrolyte containing beverages (Gatorade, Pedialyte) are best to prevent dehydration and electrolyte abnormalities.   May use Tylenol and/or Ibuprofen as needed for pain/fever.   You were tested for SARS-CoV-2 (novel coronavirus) today. Testing is performed by an outside lab ([facility]) and has variable turn around times ranging between 2-5 days. Current recommendations from the the [facility] and [facility] DHHS require that you remain at home until negative test results are have been received. In the event that your test results are positive, you will be contacted with further directives. These measures are being implemented out of an abundance of caution to prevent transmission and spread during the current SARS-CoV-2 pandemic.  Make arrangements to follow up with your regular doctor in 1 week for re-evaluation if not improving. If your symptoms/condition worsens, please seek follow up care either here or in the ER. Please remember, our [facility] providers are "right here with you" when you need [place].   Again, it was my pleasure to take care of you today. Thank you for choosing our clinic. I hope that you start to feel better quickly.   Honor Loh, MSN, APRN, FNP-C, CEN Advanced Practice Provider [facility]

## 2019-12-03 LAB — NOVEL CORONAVIRUS, NAA (HOSP ORDER, SEND-OUT TO REF LAB; TAT 18-24 HRS): SARS-CoV-2, NAA: NOT DETECTED

## 2019-12-04 NOTE — Telephone Encounter (Signed)
LVM for pt to call back to schedule NP appt.

## 2019-12-04 NOTE — ED Provider Notes (Signed)
[place], [place]   Name: Catherine Giles DOB: September 24, 1971 MRN: [id number] CSN: [id number] PCP: Patient, No Pcp Per  Arrival date and time:  12/02/19 1638  Chief Complaint:  Cough   NOTE: Prior to seeing the patient today, I have reviewed the triage nursing documentation and vital signs. Clinical staff has updated patient's PMH/PSHx, current medication list, and drug allergies/intolerances to ensure comprehensive history available to assist in medical decision making.   History:   HPI: Catherine Giles is a 49 y.o. female who presents today with complaints of fatigue, cough, chills, shortness of breath, and a generalized headache that started approximately 4 days ago. Patient denies fevers. Cough has been non-productive with (+) associated wheezing. PMH (+) for asthma and COPD. Patient is a daily smoker. She is having pleuritic chest pain associated with her forceful cough; pain mainly posterior aspect of LEFT chest wall. EMS was called to her home last night due to severe symptoms. She was given a Duoneb SVN, which markedly improved her symptoms. She was no transported to the hospital.  She denies that she has experienced any nausea, vomiting, diarrhea, or abdominal pain. She is eating and drinking well. Patient denies any perceived alterations to her sense of taste or smell. Patient presents out of concerns for her personal health after being exposed to several family members who are ill; no one has tested positive for SARS-CoV-2. She presents today with her two daughters who are will with a similar symptom constellation. She has not been tested for SARS-CoV-2 (novel coronavirus) in the past 14 days; last tested negative about a month per her report. Patient has been vaccinated for influenza this season. In efforts to conservatively manage her symptoms at home, the patient notes that she has used her prescribed inhalers, hot teas, and cough drops, which have only minimally helped to improve her symptoms.  Of  note, patient is on hydroxychlorquine and daily prednisone for her rheumatoid arthritis diagnosis.   Past Medical History:  Diagnosis Date  . Arthritis    RA  . COPD (chronic obstructive pulmonary disease) ([place])   . Emphysema of lung ([place])   . Hypertension   . Lupus ([place])   . Thyroid disease     History reviewed. No pertinent surgical history.  Family History  Problem Relation Age of Onset  . Cancer Mother   . Lung cancer Mother   . Diabetes Mother   . Hypertension Father   . Cancer Maternal Aunt   . Ovarian cancer Maternal Aunt   . Diabetes Maternal Aunt   . Diabetes Maternal Uncle   . Hypertension Maternal Grandmother   . Diabetes Maternal Grandmother     Social History   Tobacco Use  . Smoking status: Current Every Day Smoker    Packs/day: 1.00    Years: 34.00    Pack years: 34.00    Types: Cigarettes  . Smokeless tobacco: Never Used  Substance Use Topics  . Alcohol use: Yes  . Drug use: Never    Patient Active Problem List   Diagnosis Date Noted  . Chronic left hip pain 05/05/2019  . Numbness and tingling in left arm 05/05/2019  . Osteoarthritis of spine with radiculopathy, cervical region 05/05/2019  . Encounter for long-term (current) use of high-risk medication 02/14/2019  . Fibromyalgia 02/10/2019  . Rheumatoid arthritis involving multiple sites with positive rheumatoid factor ([place]) 02/10/2019  . Undifferentiated connective tissue disease ([place]) 02/10/2019    Home Medications:    Current Meds  Medication Sig  .  albuterol (VENTOLIN HFA) 108 (90 Base) MCG/ACT inhaler Inhale into the lungs.  . cyclobenzaprine (FLEXERIL) 10 MG tablet Take by mouth.  . hydroxychloroquine (PLAQUENIL) 200 MG tablet 1 tab BID, 90 days, #180 tabs  . ibuprofen (ADVIL) 600 MG tablet Take by mouth.  . leflunomide (ARAVA) 20 MG tablet Take by mouth.  . levothyroxine (SYNTHROID) 50 MCG tablet Take by mouth.  Marland Kitchen LORazepam (ATIVAN) 1 MG tablet Take 1 tablet (1 mg total) by mouth  every 8 (eight) hours as needed for anxiety.  . predniSONE (DELTASONE) 20 MG tablet Take 20 mg by mouth daily with breakfast.  . tiZANidine (ZANAFLEX) 4 MG tablet Take by mouth.  . umeclidinium-vilanterol (ANORO ELLIPTA) 62.5-25 MCG/INH AEPB Inhale into the lungs.    Allergies:   Penicillins and Shellfish allergy  Review of Systems (ROS): Review of Systems  Constitutional: Positive for fatigue. Negative for fever.  HENT: Negative for congestion, ear pain, postnasal drip, rhinorrhea, sinus pressure, sinus pain, sneezing and sore throat.   Eyes: Negative for pain, discharge and redness.  Respiratory: Positive for cough, chest tightness, shortness of breath and wheezing.        PMH (+) asthma and COPD. (+) daily smoker.  Cardiovascular: Positive for chest pain (pleuritic). Negative for palpitations.  Gastrointestinal: Negative for abdominal pain, diarrhea, nausea and vomiting.  Musculoskeletal: Negative for arthralgias, back pain, myalgias and neck pain.  Skin: Negative for color change, pallor and rash.  Neurological: Positive for headaches. Negative for dizziness, syncope and weakness.  Hematological: Negative for adenopathy.  Psychiatric/Behavioral: The patient is nervous/anxious.      Vital Signs: Today's Vitals   12/02/19 1705 12/02/19 1706 12/02/19 1709 12/02/19 1816  BP:   116/86   Pulse:   90   Resp:   16   Temp:   98.2 F (36.8 C)   TempSrc:   Oral   SpO2:   98%   Weight:  167 lb 1.7 oz (75.8 kg)    Height:  5\' 3"  (1.6 m)    PainSc: 8    8     Physical Exam: Physical Exam  Constitutional: She is oriented to person, place, and time and well-developed, well-nourished, and in no distress.  HENT:  Head: Normocephalic and atraumatic.  Right Ear: Tympanic membrane normal.  Left Ear: Tympanic membrane normal.  Nose: Nose normal.  Mouth/Throat: Uvula is midline, oropharynx is clear and moist and mucous membranes are normal.  Eyes: Pupils are equal, round, and reactive  to light.  Neck: No tracheal deviation present.  Cardiovascular: Normal rate, regular rhythm, normal heart sounds and intact distal pulses.  Pulmonary/Chest: Effort normal. She has wheezes (very mild (faint); scattered expiratory). She has rhonchi (upper airways; clears with cough). She exhibits tenderness (LEFT posterior).  Significant cough noted in clinic. No SOB or increased WOB. No distress. Able to speak in complete sentences without difficulties. SPO2 98% on RA.  Musculoskeletal:     Cervical back: Normal range of motion and neck supple.  Lymphadenopathy:    She has no cervical adenopathy.  Neurological: She is alert and oriented to person, place, and time. Gait normal.  Skin: Skin is warm and dry. No rash noted. She is not diaphoretic.  Psychiatric: Mood, memory, affect and judgment normal.  Nursing note and vitals reviewed.   Urgent Care Treatments / Results:   Orders Placed This Encounter  Procedures  . Novel Coronavirus, NAA (Hosp order, Send-out to Ref Lab; TAT 18-24 hrs  . DG Chest 2 View  LABS: PLEASE NOTE: all labs that were ordered this encounter are listed, however only abnormal results are displayed. Labs Reviewed  NOVEL CORONAVIRUS, NAA (HOSP ORDER, SEND-OUT TO REF LAB; TAT 18-24 HRS)    EKG: -None  RADIOLOGY: DG Chest 2 View  Result Date: 12/02/2019 CLINICAL DATA:  Cough, shortness of breath EXAM: CHEST - 2 VIEW COMPARISON:  04/29/2019 FINDINGS: Linear scarring at the left base. Right lung clear. Heart is normal size. No effusions. No acute bony abnormality. IMPRESSION: No active cardiopulmonary disease. Electronically Signed   By: Rolm Baptise M.D.   On: 12/02/2019 17:52    PROCEDURES: Procedures  MEDICATIONS RECEIVED THIS VISIT: Medications - No data to display  PERTINENT CLINICAL COURSE NOTES/UPDATES:   Initial Impression / Assessment and Plan / Urgent Care Course:  Pertinent labs & imaging results that were available during my care of the  patient were personally reviewed by me and considered in my medical decision making (see lab/imaging section of note for values and interpretations).  Catherine Giles is a 49 y.o. female who presents to [facility] today with complaints of Cough  Patient overall well appearing and in no acute distress today in clinic. Presenting symptoms (see HPI) and exam as documented above. She presents with symptoms associated with SARS-CoV-2 (novel coronavirus). Discussed typical symptom constellation. Reviewed potential for infection and need for testing. Patient amenable to being tested. SARS-CoV-2 swab collected by certified clinical staff. Discussed variable turn around times associated with testing, as swabs are being processed at [facility], and have been taking between 24-48 hours to come back. She was advised to self quarantine, per [facility] DHHS guidelines, until negative results received. These measures are being implemented out of an abundance of caution to prevent transmission and spread during the current SARS-CoV-2 pandemic.  Radiographs of the chest performed today revealed no acute cardiopulmonary process; no evidence of peribronchial thickening, areas of consolidation, or focal infiltrates. Presenting symptoms consistent with acute viral illness. Until ruled out with confirmatory lab testing, SARS-CoV-2 remains part of the differential. Her testing is pending at this time. I discussed with her that her symptoms are felt to be viral in nature, thus antibiotics would not offer her any relief or improve her symptoms any faster than conservative symptomatic management. Will send in prescriptions for DuoNeb SVN solutions for patient to use in place of her Albuterol SVN at home. She will continue her Albuterol MDI on a PRN basis. Cough is significant and preventing sleep. Will send in a supply of Tussionex for PRN use. She was educated on the indications and associated side effects of this medication. Discussed  supportive care measures at home during acute phase of illness. Patient to rest as much as possible. She was encouraged to ensure adequate hydration (water and ORS) to prevent dehydration and electrolyte derangements. Patient may use APAP and/or IBU on an as needed basis for pain/fever.    Discussed follow up with primary care physician in 1 week for re-evaluation. I have reviewed the follow up and strict return precautions for any new or worsening symptoms. Patient is aware of symptoms that would be deemed urgent/emergent, and would thus require further evaluation either here or in the emergency department. At the time of discharge, she verbalized understanding and consent with the discharge plan as it was reviewed with her. All questions were fielded by provider and/or clinic staff prior to patient discharge.    Final Clinical Impressions / Urgent Care Diagnoses:   Final diagnoses:  Viral URI with  cough  Encounter for laboratory testing for COVID-19 virus  Advice given about COVID-19 virus infection    New Prescriptions:  [place] Controlled Substance Registry consulted? Not Applicable  Meds ordered this encounter  Medications  . ipratropium-albuterol (DUONEB) 0.5-2.5 (3) MG/3ML SOLN    Sig: Take 3 mLs by nebulization every 6 (six) hours as needed.    Dispense:  75 mL    Refill:  0  . chlorpheniramine-HYDROcodone (TUSSIONEX PENNKINETIC ER) 10-8 MG/5ML SUER    Sig: Take 5 mLs by mouth every 12 (twelve) hours as needed for cough. Will causes drowsiness; NO DRIVING.    Dispense:  70 mL    Refill:  0    Recommended Follow up Care:  Patient encouraged to follow up with the following provider within the specified time frame, or sooner as dictated by the severity of her symptoms. As always, she was instructed that for any urgent/emergent care needs, she should seek care either here or in the emergency department for more immediate evaluation.  Follow-up Information    PCP In 1 week.   Why:  General reassessment of symptoms if not improving        NOTE: This note was prepared using Lobbyist along with smaller Company secretary. Despite my best ability to proofread, there is the potential that transcriptional errors may still occur from this process, and are completely unintentional.     Karen Kitchens, NP 12/04/19 1130

## 2019-12-08 ENCOUNTER — Emergency Department: Payer: Medicare Other

## 2019-12-08 ENCOUNTER — Encounter: Payer: Self-pay | Admitting: Emergency Medicine

## 2019-12-08 ENCOUNTER — Emergency Department
Admission: EM | Admit: 2019-12-08 | Discharge: 2019-12-08 | Disposition: A | Discharge status: Home / Self Care | Payer: Medicare Other | Attending: Emergency Medicine | Admitting: Emergency Medicine

## 2019-12-08 ENCOUNTER — Other Ambulatory Visit: Payer: Self-pay

## 2019-12-08 DIAGNOSIS — L03114 Cellulitis of left upper limb: Secondary | ICD-10-CM

## 2019-12-08 DIAGNOSIS — F17210 Nicotine dependence, cigarettes, uncomplicated: Secondary | ICD-10-CM | POA: Insufficient documentation

## 2019-12-08 DIAGNOSIS — M79602 Pain in left arm: Secondary | ICD-10-CM | POA: Diagnosis present

## 2019-12-08 DIAGNOSIS — J449 Chronic obstructive pulmonary disease, unspecified: Secondary | ICD-10-CM | POA: Insufficient documentation

## 2019-12-08 DIAGNOSIS — I10 Essential (primary) hypertension: Secondary | ICD-10-CM | POA: Insufficient documentation

## 2019-12-08 DIAGNOSIS — M069 Rheumatoid arthritis, unspecified: Secondary | ICD-10-CM | POA: Insufficient documentation

## 2019-12-08 DIAGNOSIS — Z79899 Other long term (current) drug therapy: Secondary | ICD-10-CM | POA: Insufficient documentation

## 2019-12-08 LAB — PROTIME-INR
INR: 1 (ref 0.8–1.2)
Prothrombin Time: 13.3 seconds (ref 11.4–15.2)

## 2019-12-08 LAB — CBC WITH DIFFERENTIAL/PLATELET
Abs Immature Granulocytes: 0.04 10*3/uL (ref 0.00–0.07)
Basophils Absolute: 0.1 10*3/uL (ref 0.0–0.1)
Basophils Relative: 1 %
Eosinophils Absolute: 0.1 10*3/uL (ref 0.0–0.5)
Eosinophils Relative: 1 %
HCT: 42.4 % (ref 36.0–46.0)
Hemoglobin: 14 g/dL (ref 12.0–15.0)
Immature Granulocytes: 0 %
Lymphocytes Relative: 48 %
Lymphs Abs: 6.2 10*3/uL — ABNORMAL HIGH (ref 0.7–4.0)
MCH: 31.4 pg (ref 26.0–34.0)
MCHC: 33 g/dL (ref 30.0–36.0)
MCV: 95.1 fL (ref 80.0–100.0)
Monocytes Absolute: 0.7 10*3/uL (ref 0.1–1.0)
Monocytes Relative: 5 %
Neutro Abs: 5.8 10*3/uL (ref 1.7–7.7)
Neutrophils Relative %: 45 %
Platelets: 418 10*3/uL — ABNORMAL HIGH (ref 150–400)
RBC: 4.46 MIL/uL (ref 3.87–5.11)
RDW: 13.9 % (ref 11.5–15.5)
WBC: 12.9 10*3/uL — ABNORMAL HIGH (ref 4.0–10.5)
nRBC: 0 % (ref 0.0–0.2)

## 2019-12-08 LAB — BASIC METABOLIC PANEL
Anion gap: 9 (ref 5–15)
BUN: 10 mg/dL (ref 6–20)
CO2: 25 mmol/L (ref 22–32)
Calcium: 8.7 mg/dL — ABNORMAL LOW (ref 8.9–10.3)
Chloride: 105 mmol/L (ref 98–111)
Creatinine, Ser: 0.47 mg/dL (ref 0.44–1.00)
GFR calc Af Amer: >60 mL/min (ref >60)
GFR calc non Af Amer: >60 mL/min (ref >60)
Glucose, Bld: 96 mg/dL (ref 70–99)
Potassium: 3.6 mmol/L (ref 3.5–5.1)
Sodium: 139 mmol/L (ref 135–145)

## 2019-12-08 LAB — APTT: aPTT: 27 seconds (ref 24–36)

## 2019-12-08 MED ORDER — LORAZEPAM 2 MG/ML IJ SOLN
2.0000 mg | Freq: Once | INTRAMUSCULAR | Status: AC
  Administered 2019-12-08: 2 mg via INTRAVENOUS
  Filled 2019-12-08: qty 1

## 2019-12-08 MED ORDER — MORPHINE SULFATE (PF) 4 MG/ML IV SOLN
4.0000 mg | Freq: Once | INTRAVENOUS | Status: AC
  Administered 2019-12-08: 4 mg via INTRAVENOUS
  Filled 2019-12-08: qty 1

## 2019-12-08 MED ORDER — HYDROMORPHONE HCL 1 MG/ML IJ SOLN
1.0000 mg | Freq: Once | INTRAMUSCULAR | Status: AC
  Administered 2019-12-08: 1 mg via INTRAVENOUS
  Filled 2019-12-08: qty 1

## 2019-12-08 MED ORDER — DOXYCYCLINE HYCLATE 100 MG PO CAPS: 100.0000 mg | ORAL_CAPSULE | Freq: Two times a day (BID) | ORAL | 0 refills | Status: AC

## 2019-12-08 MED ORDER — PREGABALIN 75 MG PO CAPS
75.0000 mg | ORAL_CAPSULE | ORAL | Status: AC
  Administered 2019-12-08: 09:00:00 75 mg via ORAL
  Filled 2019-12-08: qty 1

## 2019-12-08 NOTE — ED Triage Notes (Signed)
Patient states she had a dental procedure done on Friday at the office and had an IV in her left hand and states she told them it hurt and didn't feel right and states it has hurt and she has been in pain since then and the pain and burning has only gotten worse. Patient states it is a 10/10 and states she can barely move it now. No issues prior to Friday.

## 2019-12-08 NOTE — ED Notes (Signed)
Pt given crackers and gingerale.

## 2019-12-08 NOTE — ED Provider Notes (Signed)
[facility] Emergency Department Provider Note   ____________________________________________   First MD Initiated Contact with Patient 12/08/19 [phone]     (approximate)  I have reviewed the triage vital signs and the nursing notes.   HISTORY  Chief Complaint Arm Pain    HPI Catherine Giles is a 49 y.o. female with past medical history of hypertension, lupus, COPD, rheumatoid arthritis, and chronic pain presents to the ED complaining of left arm pain.  Patient reports that she had a dental extraction performed 3 days ago at her dentist office and had an IV placed in her left hand for the procedure.  She reports pain and discomfort with IV placement, has continued to have worsening pain spreading up her left arm since then.  She states her left arm feels swollen with pain over her dorsal left hand and extending into her forearm.  She has not noticed any overlying redness or rash, denies any fevers or chills.  She denies any history of DVT/PE, is not anticoagulated.  She has been taking oxycodone at home without relief, denies prior issues with this left arm.        Past Medical History:  Diagnosis Date  . Arthritis    RA  . COPD (chronic obstructive pulmonary disease) ([place])   . Emphysema of lung ([place])   . Hypertension   . Lupus ([place])   . Thyroid disease     Patient Active Problem List   Diagnosis Date Noted  . Chronic left hip pain 05/05/2019  . Numbness and tingling in left arm 05/05/2019  . Osteoarthritis of spine with radiculopathy, cervical region 05/05/2019  . Encounter for long-term (current) use of high-risk medication 02/14/2019  . Fibromyalgia 02/10/2019  . Rheumatoid arthritis involving multiple sites with positive rheumatoid factor ([place]) 02/10/2019  . Undifferentiated connective tissue disease ([place]) 02/10/2019    History reviewed. No pertinent surgical history.  Prior to Admission medications   Medication Sig Start Date End Date Taking?  Authorizing Provider  albuterol (VENTOLIN HFA) 108 (90 Base) MCG/ACT inhaler Inhale into the lungs. 02/12/19 02/12/20  [provider]  chlorpheniramine-HYDROcodone (TUSSIONEX PENNKINETIC ER) 10-8 MG/5ML SUER Take 5 mLs by mouth every 12 (twelve) hours as needed for cough. Will causes drowsiness; NO DRIVING. [id number]   Karen Kitchens, NP  cyclobenzaprine (FLEXERIL) 10 MG tablet Take by mouth.    [provider]  doxycycline (VIBRAMYCIN) 100 MG capsule Take 1 capsule (100 mg total) by mouth 2 (two) times daily for 7 days. 12/08/19 12/15/19  Blake Divine, MD  hydroxychloroquine (PLAQUENIL) 200 MG tablet 1 tab BID, 90 days, #180 tabs 02/14/19   [provider]  ibuprofen (ADVIL) 600 MG tablet Take by mouth.    [provider]  ipratropium-albuterol (DUONEB) 0.5-2.5 (3) MG/3ML SOLN Take 3 mLs by nebulization every 6 (six) hours as needed. 12/02/19   Karen Kitchens, NP  leflunomide (ARAVA) 20 MG tablet Take by mouth. 05/05/19 05/04/20  [provider]  levothyroxine (SYNTHROID) 50 MCG tablet Take by mouth.    [provider]  LORazepam (ATIVAN) 1 MG tablet Take 1 tablet (1 mg total) by mouth every 8 (eight) hours as needed for anxiety. 04/29/19 04/28/20  Merlyn Lot, MD  losartan (COZAAR) 100 MG tablet Take by mouth.    [provider]  predniSONE (DELTASONE) 20 MG tablet Take 20 mg by mouth daily with breakfast.    [provider]  tiZANidine (ZANAFLEX) 4 MG tablet Take by mouth.  [provider]  umeclidinium-vilanterol (ANORO ELLIPTA) 62.5-25 MCG/INH AEPB Inhale into the lungs. 02/12/19   [provider]    Allergies Penicillins and Shellfish allergy  Family History  Problem Relation Age of Onset  . Cancer Mother   . Lung cancer Mother   . Diabetes Mother   . Hypertension Father   . Cancer Maternal Aunt   . Ovarian cancer Maternal Aunt   . Diabetes Maternal Aunt   . Diabetes Maternal Uncle   . Hypertension  Maternal Grandmother   . Diabetes Maternal Grandmother     Social History Social History   Tobacco Use  . Smoking status: Current Every Day Smoker    Packs/day: 1.00    Years: 34.00    Pack years: 34.00    Types: Cigarettes  . Smokeless tobacco: Never Used  Substance Use Topics  . Alcohol use: Yes  . Drug use: Never    Review of Systems  Constitutional: No fever/chills Eyes: No visual changes. ENT: No sore throat. Cardiovascular: Denies chest pain. Respiratory: Denies shortness of breath. Gastrointestinal: No abdominal pain.  No nausea, no vomiting.  No diarrhea.  No constipation. Genitourinary: Negative for dysuria. Musculoskeletal: Negative for back pain.  Positive for left arm pain. Skin: Negative for rash. Neurological: Negative for headaches, focal weakness or numbness.  ____________________________________________   PHYSICAL EXAM:  VITAL SIGNS: ED Triage Vitals  Enc Vitals Group     BP 12/08/19 0549 (!) 144/120     Pulse Rate 12/08/19 0549 (!) 110     Resp 12/08/19 0549 18     Temp 12/08/19 0549 98.3 F (36.8 C)     Temp src --      SpO2 12/08/19 0549 95 %     Weight 12/08/19 0550 166 lb (75.3 kg)     Height 12/08/19 0550 5\' 3"  (1.6 m)     Head Circumference --      Peak Flow --      Pain Score 12/08/19 0550 10     Pain Loc --      Pain Edu? --      Excl. in [place]? --     Constitutional: Alert and oriented. Eyes: Conjunctivae are normal. Head: Atraumatic. Nose: No congestion/rhinnorhea. Mouth/Throat: Mucous membranes are moist. Neck: Normal ROM Cardiovascular: Normal rate, regular rhythm. Grossly normal heart sounds. Respiratory: Normal respiratory effort.  No retractions. Lungs CTAB. Gastrointestinal: Soft and nontender. No distention. Genitourinary: deferred Musculoskeletal: No lower extremity tenderness nor edema.  Diffuse tenderness to distal left arm with mild edema to dorsum of left hand, no significant erythema or warmth.  2+ radial pulses  bilaterally, cap refill less than 2 seconds in digits. Neurologic:  Normal speech and language. No gross focal neurologic deficits are appreciated. Skin:  Skin is warm, dry and intact. No rash noted. Psychiatric: Mood and affect are normal. Speech and behavior are normal.  ____________________________________________   LABS (all labs ordered are listed, but only abnormal results are displayed)  Labs Reviewed  CBC WITH DIFFERENTIAL/PLATELET - Abnormal; Notable for the following components:      Result Value   WBC 12.9 (*)    Platelets 418 (*)    All other components within normal limits     PROCEDURES  Procedure(s) performed (including Critical Care):  Procedures   ____________________________________________   INITIAL IMPRESSION / ASSESSMENT AND PLAN / ED COURSE       49 year old female with history of lupus and rheumatoid arthritis presents to the ED complaining of pain  and swelling in her distal left arm ever since she had an IV placed for dental procedure 3 days ago.  She appears neurovascularly intact to her left upper extremity with strong pulse and good cap refill.  She primarily has tenderness over the dorsum of her left hand with some mild edema but no evidence of significant cellulitis.  We will further assess with ultrasound for possible DVT versus superficial thrombophlebitis.  Arthritis seems less likely given the location of her pain, range of motion is intact and I doubt septic arthritis.  Patient turned over to oncoming provider pending lab results and ultrasound.      ____________________________________________   FINAL CLINICAL IMPRESSION(S) / ED DIAGNOSES  Final diagnoses:  Left arm pain  Cellulitis of left upper extremity     ED Discharge Orders         Ordered    doxycycline (VIBRAMYCIN) 100 MG capsule  2 times daily     12/08/19 [id number]           Note:  This document was prepared using Dragon voice recognition software and may include  unintentional dictation errors.   Blake Divine, MD 12/08/19 [phone]

## 2019-12-08 NOTE — ED Provider Notes (Addendum)
Procedures ----------------------------------------- 8:41 AM on 12/08/2019 -----------------------------------------  Assumed care from Dr. Charna Archer awaiting ultrasound of left upper extremity.  Unfortunately patient was unable to tolerate the study due to her left upper extremity pain.  I reassessed, she complains of severe pain in the left dorsal hand that radiates up to the elbow.  There is very slight discoloration within 1 cm of an apparent IV puncture site from her recent dental procedure.  She confirms again that the pain all started after the dental procedure and IV and that she could tell when the IV was inserted that "something was not right."  Normal radial and ulnar pulses.  Normal capillary refill and skin color.  Warm hand and fingers.  Compartments soft.  No crepitus.  This seems to be neuropathic pain, possibly from a cutaneous nerve that could have been encountered with peripheral IV insertion.  I will give Ativan and Lyrica to help with neuropathic pain and calming her nerves to hopefully facilitate the ultrasound study.  I do not think she has compartment syndrome necrotizing fasciitis osteomyelitis abscess arterial occlusion severe carpal tunnel or stroke.  Clinical Course as of Dec 07 1513  Mon Dec 08, 2019  1048 Still complaining of 7/10 pain after Ativan and Lyrica though she does feel more calm and relaxed.  I will give a milligram of IV Dilaudid to facilitate the ultrasound study.   [PS]    Clinical Course User Index [PS] Carrie Mew, MD    ----------------------------------------- 12:36 PM on 12/08/2019 -----------------------------------------  Ultrasound normal.  Vital signs unremarkable.  Patient is calm.  Stable for discharge.      Carrie Mew, MD 12/08/19 [place]    Carrie Mew, MD 12/08/19 1515

## 2019-12-08 NOTE — ED Notes (Signed)
Introduced self to pt. Pt with 10/10 pain, tears, rocking. Dr Joni Fears notified. meds ordered.

## 2019-12-08 NOTE — ED Notes (Signed)
Pt sleeping. 

## 2019-12-08 NOTE — Discharge Instructions (Addendum)
Your arm ultrasound was normal.  Take doxycycline as prescribed to protect against skin infection. Use a gentle heating pad or warm compresses intermittently on your left hand, which can alleviate pain and speed up healing.

## 2019-12-08 NOTE — ED Notes (Signed)
ED Provider at bedside. 

## 2019-12-09 ENCOUNTER — Telehealth: Payer: Self-pay | Admitting: Medical Oncology

## 2019-12-09 NOTE — Telephone Encounter (Signed)
Pt lost RX of doxycycline, [facility] called to obtain verbal. This RN gave verbal to pharmacist. Doxy 100mg  cap twice daily x 7 days.

## 2019-12-09 NOTE — Telephone Encounter (Signed)
LVM for pt to call back.

## 2019-12-13 ENCOUNTER — Encounter: Payer: Self-pay | Admitting: Nurse Practitioner

## 2019-12-13 DIAGNOSIS — E118 Type 2 diabetes mellitus with unspecified complications: Secondary | ICD-10-CM | POA: Insufficient documentation

## 2019-12-13 DIAGNOSIS — I10 Essential (primary) hypertension: Secondary | ICD-10-CM | POA: Insufficient documentation

## 2019-12-13 DIAGNOSIS — E669 Obesity, unspecified: Secondary | ICD-10-CM | POA: Insufficient documentation

## 2019-12-13 DIAGNOSIS — R7309 Other abnormal glucose: Secondary | ICD-10-CM | POA: Insufficient documentation

## 2019-12-13 DIAGNOSIS — J432 Centrilobular emphysema: Secondary | ICD-10-CM | POA: Insufficient documentation

## 2019-12-13 DIAGNOSIS — I152 Hypertension secondary to endocrine disorders: Secondary | ICD-10-CM | POA: Insufficient documentation

## 2019-12-13 DIAGNOSIS — E039 Hypothyroidism, unspecified: Secondary | ICD-10-CM | POA: Insufficient documentation

## 2019-12-13 DIAGNOSIS — E1169 Type 2 diabetes mellitus with other specified complication: Secondary | ICD-10-CM | POA: Insufficient documentation

## 2019-12-13 DIAGNOSIS — E063 Autoimmune thyroiditis: Secondary | ICD-10-CM | POA: Insufficient documentation

## 2019-12-13 DIAGNOSIS — E1159 Type 2 diabetes mellitus with other circulatory complications: Secondary | ICD-10-CM | POA: Insufficient documentation

## 2019-12-16 ENCOUNTER — Encounter: Payer: Self-pay | Admitting: Nurse Practitioner

## 2019-12-16 ENCOUNTER — Other Ambulatory Visit: Payer: Self-pay

## 2019-12-16 ENCOUNTER — Ambulatory Visit (INDEPENDENT_AMBULATORY_CARE_PROVIDER_SITE_OTHER): Payer: Medicare Other | Admitting: Nurse Practitioner

## 2019-12-16 VITALS — BP 131/85 | HR 96 | Temp 98.1°F | Ht 62.0 in | Wt 170.4 lb

## 2019-12-16 DIAGNOSIS — M79642 Pain in left hand: Secondary | ICD-10-CM | POA: Diagnosis not present

## 2019-12-16 MED ORDER — GABAPENTIN 300 MG PO CAPS: 300.0000 mg | ORAL_CAPSULE | Freq: Three times a day (TID) | ORAL | 3 refills | Status: DC

## 2019-12-16 MED ORDER — KETOROLAC TROMETHAMINE 60 MG/2ML IM SOLN
60.0000 mg | Freq: Once | INTRAMUSCULAR | Status: AC
  Administered 2019-12-16: 60 mg via INTRAMUSCULAR

## 2019-12-16 NOTE — Patient Instructions (Signed)
Hand Pain Many things can cause hand pain. Some common causes are:  An injury.  Repeating the same movement with your hand over and over (overuse).  Osteoporosis.  Arthritis.  Lumps in the tendons or joints of the hand and wrist (ganglion cysts).  Nerve compression syndromes (carpal tunnel syndrome).  Inflammation of the tendons (tendinitis).  Infection. Follow these instructions at home: Pay attention to any changes in your symptoms. Take these actions to help with your discomfort: Managing pain, stiffness, and swelling   Take over-the-counter and prescription medicines only as told by your health care provider.  Wear a hand splint or support as told by your health care provider.  If directed, put ice on the affected area: ? Put ice in a plastic bag. ? Place a towel between your skin and the bag. ? Leave the ice on for 20 minutes, 2-3 times a day. Activity  Take breaks from repetitive activity often.  Avoid activities that make your pain worse.  Minimize stress on your hands and wrists as much as possible.  Do stretches or exercises as told by your health care provider.  Do not do activities that make your pain worse. Contact a health care provider if:  Your pain does not get better after a few days of self-care.  Your pain gets worse.  Your pain affects your ability to do your daily activities. Get help right away if:  Your hand becomes warm, red, or swollen.  Your hand is numb or tingling.  Your hand is extremely swollen or deformed.  Your hand or fingers turn white or blue.  You cannot move your hand, wrist, or fingers. Summary  Many things can cause hand pain.  Contact your health care provider if your pain does not get better after a few days of self care.  Minimize stress on your hands and wrists as much as possible.  Do not do activities that make your pain worse. This information is not intended to replace advice given to you by your  health care provider. Make sure you discuss any questions you have with your health care provider. Document Revised: 07/05/2018 Document Reviewed: 07/05/2018 Elsevier Patient Education  2020 [employer] Inc[employer]  

## 2019-12-16 NOTE — Assessment & Plan Note (Addendum)
Post IV infiltration, ongoing.  Significant pain and decreased ROM.  Recent doppler negative for DVT.  Continue Doxycycline as ordered in ER, continue until complete.  Toradol in office today.  Pain appears neuropathic in nature, will increase her Gabapentin to 300 MG TID (current 200 MG BID), reduce back down one pain improves.  Obtain CBC and CMP + uric acid level today. Urgent referral to ortho for hand specialist, would benefit from further assessment and recommendations.  Consider physical therapy if not ordered by ortho.  Return in 2 weeks for follow-up and physical.

## 2019-12-16 NOTE — Progress Notes (Signed)
New Patient Office Visit  Subjective:  Patient ID: Catherine Giles, female    DOB: 1970/12/09  Age: 49 y.o. MRN: [id number]  CC:  Chief Complaint  Patient presents with  . Establish Care    Left hand pain from an infiltrated IV    HPI [name] presents for new patient visit to establish care.  Introduced to Designer, jewellery role and practice setting.  All questions answered.  Presents today for main concern of left hand pain -- due to current pain level will address this today and chronic diseases next visit, which she agrees with.  IV INFILTRATION LEFT HAND: Went for dental procedure 12/05/2019, oral surgeon in [place].  Presented for tooth extraction.  She reports telling them her hand hurt prior to procedure but she reports they continued to use IV, has had pain in hand ever since.  Was seen in ER on 12/08/2019 and doppler ultrasound performed with no DVT noted.  Labs showed mild elevation in WBC at 12.9, normal neutrophil. Was provided Lyrica in ER and sent home with Doxycycline, which she is still taking.  Continues to have ongoing pain to dorsal aspect hand and decreased mobility, describes this as her hand being on fire into her fingers.  She is right hand dominant. At baseline has underlying RA, Lupus, and fibromyalgia.  Is followed by rheumatology, last seen on 08/26/2019 by Dr. Annalee Genta.  Currently on multiple medications for this to include: Plaquenil, Arava, Tizanidine, Flexeril, Prednisone, and Gabapentin. Duration: weeks Involved hand: left Mechanism of injury: trauma Location: diffuse Onset: gradual Severity: 10/10  Quality: burning, shooting and stabbing, burning like a torch going through her hand Frequency: constant Radiation: no Aggravating factors:  Alleviating factors:  Treatments attempted:  Relief with NSAIDs?: takes 800 MG Ibuprofen for pain Weakness: yes Numbness: yes Redness: no Swelling:no Bruising: no Fevers: no   Past Medical History:    Diagnosis Date  . Arthritis    RA  . COPD (chronic obstructive pulmonary disease) ([place])   . Emphysema of lung ([place])   . Hypertension   . Lupus ([place])   . Thyroid disease     History reviewed. No pertinent surgical history.  Family History  Problem Relation Age of Onset  . Cancer Mother   . Lung cancer Mother   . Diabetes Mother   . Hypertension Father   . Cancer Maternal Aunt   . Ovarian cancer Maternal Aunt   . Diabetes Maternal Aunt   . Diabetes Maternal Uncle   . Hypertension Maternal Grandmother   . Diabetes Maternal Grandmother     Social History   Socioeconomic History  . Marital status: Divorced    Spouse name: Not on file  . Number of children: Not on file  . Years of education: Not on file  . Highest education level: Not on file  Occupational History  . Not on file  Tobacco Use  . Smoking status: Current Every Day Smoker    Packs/day: 1.00    Years: 34.00    Pack years: 34.00    Types: Cigarettes  . Smokeless tobacco: Never Used  Substance and Sexual Activity  . Alcohol use: Yes  . Drug use: Never  . Sexual activity: Yes  Other Topics Concern  . Not on file  Social History Narrative  . Not on file   Social Determinants of Health   Financial Resource Strain:   . Difficulty of Paying Living Expenses: Not on file  Food Insecurity:   . Worried About  Running Out of Food in the Last Year: Not on file  . Ran Out of Food in the Last Year: Not on file  Transportation Needs:   . Lack of Transportation (Medical): Not on file  . Lack of Transportation (Non-Medical): Not on file  Physical Activity:   . Days of Exercise per Week: Not on file  . Minutes of Exercise per Session: Not on file  Stress:   . Feeling of Stress : Not on file  Social Connections:   . Frequency of Communication with Friends and Family: Not on file  . Frequency of Social Gatherings with Friends and Family: Not on file  . Attends Religious Services: Not on file  . Active Member  of Clubs or Organizations: Not on file  . Attends Archivist Meetings: Not on file  . Marital Status: Not on file  Intimate Partner Violence:   . Fear of Current or Ex-Partner: Not on file  . Emotionally Abused: Not on file  . Physically Abused: Not on file  . Sexually Abused: Not on file    ROS Review of Systems  Constitutional: Negative for activity change, appetite change, diaphoresis, fatigue and fever.  Respiratory: Negative for cough, chest tightness, shortness of breath and wheezing.   Cardiovascular: Negative for chest pain, palpitations and leg swelling.  Musculoskeletal: Positive for arthralgias.  Psychiatric/Behavioral: Negative.     Objective:   Today's Vitals: BP 131/85 (BP Location: Right Arm, Patient Position: Sitting, Cuff Size: Normal)   Pulse 96   Temp 98.1 F (36.7 C) (Oral)   Ht 5\' 2"  (1.575 m)   Wt 170 lb 6.4 oz (77.3 kg)   SpO2 94%   BMI 31.17 kg/m   Physical Exam Vitals and nursing note reviewed.  Constitutional:      General: She is awake. She is not in acute distress.    Appearance: She is well-developed and overweight. She is not ill-appearing.     Comments: Very tearful and holding left hand in right hand.  HENT:     Head: Normocephalic.     Right Ear: Hearing normal.     Left Ear: Hearing normal.  Eyes:     General: Lids are normal.        Right eye: No discharge.        Left eye: No discharge.     Conjunctiva/sclera: Conjunctivae normal.     Pupils: Pupils are equal, round, and reactive to light.  Neck:     Vascular: No carotid bruit.  Cardiovascular:     Rate and Rhythm: Normal rate and regular rhythm.     Heart sounds: Normal heart sounds. No murmur. No gallop.   Pulmonary:     Effort: Pulmonary effort is normal. No accessory muscle usage or respiratory distress.     Breath sounds: Normal breath sounds.  Abdominal:     General: Bowel sounds are normal.     Palpations: Abdomen is soft.  Musculoskeletal:     Right hand:  Normal.     Left hand: Swelling (mild to dorsal aspect) and tenderness present. No deformity or lacerations. Decreased range of motion. Decreased strength of finger abduction, thumb/finger opposition and wrist extension. Normal sensation. There is no disruption of two-point discrimination. Normal capillary refill. Normal pulse.     Cervical back: Normal range of motion and neck supple.     Right lower leg: No edema.     Left lower leg: No edema.     Comments: Decreased ROM of  left hand with decrease strength and mild tenderness to palpation noted.  No warmth or erythema.  Small pinpoint healed IV insertion point to lateral aspect.    Skin:    General: Skin is warm and dry.  Neurological:     Mental Status: She is alert and oriented to person, place, and time.  Psychiatric:        Attention and Perception: Attention normal.        Mood and Affect: Affect is tearful.        Speech: Speech normal.        Behavior: Behavior normal. Behavior is cooperative.     Assessment & Plan:   Problem List Items Addressed This Visit      Other   Pain of left hand - Primary    Post IV infiltration, ongoing.  Significant pain and decreased ROM.  Recent doppler negative for DVT.  Continue Doxycycline as ordered in ER, continue until complete.  Toradol in office today.  Pain appears neuropathic in nature, will increase her Gabapentin to 300 MG TID (current 200 MG BID), reduce back down one pain improves.  Obtain CBC and CMP + uric acid level today. Urgent referral to ortho for hand specialist, would benefit from further assessment and recommendations.  Consider physical therapy if not ordered by ortho.  Return in 2 weeks for follow-up and physical.      Relevant Orders   CBC with Differential/Platelet   Comprehensive metabolic panel   Ambulatory referral to Orthopedics   Uric acid      Outpatient Encounter Medications as of 12/16/2019  Medication Sig  . albuterol (VENTOLIN HFA) 108 (90 Base) MCG/ACT  inhaler Inhale into the lungs.  . busPIRone (BUSPAR) 10 MG tablet Take by mouth.  . chlorpheniramine-HYDROcodone (TUSSIONEX PENNKINETIC ER) 10-8 MG/5ML SUER Take 5 mLs by mouth every 12 (twelve) hours as needed for cough. Will causes drowsiness; NO DRIVING.  . cyclobenzaprine (FLEXERIL) 10 MG tablet Take by mouth.  . DOXYCYCLINE PO Take 100 mg by mouth 2 (two) times daily.  . fluticasone furoate-vilanterol (BREO ELLIPTA) 200-25 MCG/INH AEPB Inhale into the lungs.  . hydroxychloroquine (PLAQUENIL) 200 MG tablet 1 tab BID, 90 days, #180 tabs  . ibuprofen (ADVIL) 600 MG tablet Take by mouth.  Marland Kitchen ipratropium-albuterol (DUONEB) 0.5-2.5 (3) MG/3ML SOLN Take 3 mLs by nebulization every 6 (six) hours as needed.  . leflunomide (ARAVA) 20 MG tablet Take by mouth.  . levothyroxine (SYNTHROID) 50 MCG tablet Take by mouth.  Marland Kitchen LORazepam (ATIVAN) 1 MG tablet Take 1 tablet (1 mg total) by mouth every 8 (eight) hours as needed for anxiety.  Marland Kitchen losartan (COZAAR) 100 MG tablet Take by mouth.  . predniSONE (DELTASONE) 20 MG tablet Take 20 mg by mouth daily with breakfast.  . tiZANidine (ZANAFLEX) 4 MG tablet Take by mouth.  . umeclidinium-vilanterol (ANORO ELLIPTA) 62.5-25 MCG/INH AEPB Inhale into the lungs.  . [DISCONTINUED] gabapentin (NEURONTIN) 100 MG capsule TAKE 2 CAPSULES BY MOUTH TWICE DAILY  . gabapentin (NEURONTIN) 300 MG capsule Take 1 capsule (300 mg total) by mouth 3 (three) times daily.  . [EXPIRED] ketorolac (TORADOL) injection 60 mg    No facility-administered encounter medications on file as of 12/16/2019.    Follow-up: Return in about 2 weeks (around 12/30/2019) for Annual physical.   Venita Lick, NP

## 2019-12-17 DIAGNOSIS — G90519 Complex regional pain syndrome I of unspecified upper limb: Secondary | ICD-10-CM | POA: Insufficient documentation

## 2019-12-17 LAB — COMPREHENSIVE METABOLIC PANEL
ALT: 29 IU/L (ref 0–32)
AST: 26 IU/L (ref 0–40)
Albumin/Globulin Ratio: 1.6 (ref 1.2–2.2)
Albumin: 4.2 g/dL (ref 3.8–4.8)
Alkaline Phosphatase: 86 IU/L (ref 39–117)
BUN/Creatinine Ratio: 26 — ABNORMAL HIGH (ref 9–23)
BUN: 17 mg/dL (ref 6–24)
Bilirubin Total: 0.2 mg/dL (ref 0.0–1.2)
CO2: 21 mmol/L (ref 20–29)
Calcium: 9.7 mg/dL (ref 8.7–10.2)
Chloride: 105 mmol/L (ref 96–106)
Creatinine, Ser: 0.66 mg/dL (ref 0.57–1.00)
GFR calc Af Amer: 119 mL/min/{1.73_m2} (ref >59)
GFR calc non Af Amer: 103 mL/min/{1.73_m2} (ref >59)
Globulin, Total: 2.7 g/dL (ref 1.5–4.5)
Glucose: 79 mg/dL (ref 65–99)
Potassium: 4.3 mmol/L (ref 3.5–5.2)
Sodium: 141 mmol/L (ref 134–144)
Total Protein: 6.9 g/dL (ref 6.0–8.5)

## 2019-12-17 LAB — CBC WITH DIFFERENTIAL/PLATELET
Basophils Absolute: 0.1 10*3/uL (ref 0.0–0.2)
Basos: 1 %
EOS (ABSOLUTE): 0.2 10*3/uL (ref 0.0–0.4)
Eos: 1 %
Hematocrit: 44.2 % (ref 34.0–46.6)
Hemoglobin: 14.8 g/dL (ref 11.1–15.9)
Immature Grans (Abs): 0 10*3/uL (ref 0.0–0.1)
Immature Granulocytes: 0 %
Lymphocytes Absolute: 6.2 10*3/uL — ABNORMAL HIGH (ref 0.7–3.1)
Lymphs: 51 %
MCH: 31.6 pg (ref 26.6–33.0)
MCHC: 33.5 g/dL (ref 31.5–35.7)
MCV: 94 fL (ref 79–97)
Monocytes Absolute: 0.9 10*3/uL (ref 0.1–0.9)
Monocytes: 8 %
Neutrophils Absolute: 4.8 10*3/uL (ref 1.4–7.0)
Neutrophils: 39 %
Platelets: 437 10*3/uL (ref 150–450)
RBC: 4.69 x10E6/uL (ref 3.77–5.28)
RDW: 12.6 % (ref 11.7–15.4)
WBC: 12.2 10*3/uL — ABNORMAL HIGH (ref 3.4–10.8)

## 2019-12-17 LAB — URIC ACID: Uric Acid: 4.8 mg/dL (ref 2.6–6.2)

## 2019-12-17 NOTE — Progress Notes (Signed)
Good morning.  Please let Catherine Giles know her labs have returned.  CBC continues to show mild increase in white blood cell, but downward trend.  Continue the antibiotic ER prescribed until complete.  All other labs, including kidney and liver function, are in normal range.  When is she scheduled to see hand specialist?  Tell her to keep me up to date on changes or any needs.  Have a good day!!

## 2019-12-29 ENCOUNTER — Ambulatory Visit: Payer: Medicare Other | Admitting: Nurse Practitioner

## 2020-01-05 ENCOUNTER — Ambulatory Visit: Payer: Medicare Other | Admitting: Nurse Practitioner

## 2020-01-05 ENCOUNTER — Telehealth (INDEPENDENT_AMBULATORY_CARE_PROVIDER_SITE_OTHER): Payer: Medicare Other | Admitting: Nurse Practitioner

## 2020-01-05 ENCOUNTER — Encounter: Payer: Self-pay | Admitting: Nurse Practitioner

## 2020-01-05 VITALS — BP 168/99 | HR 93 | Ht 62.0 in | Wt 170.0 lb

## 2020-01-05 DIAGNOSIS — M0579 Rheumatoid arthritis with rheumatoid factor of multiple sites without organ or systems involvement: Secondary | ICD-10-CM | POA: Diagnosis not present

## 2020-01-05 DIAGNOSIS — F5104 Psychophysiologic insomnia: Secondary | ICD-10-CM

## 2020-01-05 DIAGNOSIS — I10 Essential (primary) hypertension: Secondary | ICD-10-CM

## 2020-01-05 DIAGNOSIS — M79642 Pain in left hand: Secondary | ICD-10-CM | POA: Diagnosis not present

## 2020-01-05 DIAGNOSIS — M359 Systemic involvement of connective tissue, unspecified: Secondary | ICD-10-CM | POA: Diagnosis not present

## 2020-01-05 DIAGNOSIS — Z13220 Encounter for screening for lipoid disorders: Secondary | ICD-10-CM

## 2020-01-05 DIAGNOSIS — L84 Corns and callosities: Secondary | ICD-10-CM

## 2020-01-05 DIAGNOSIS — E039 Hypothyroidism, unspecified: Secondary | ICD-10-CM

## 2020-01-05 DIAGNOSIS — E559 Vitamin D deficiency, unspecified: Secondary | ICD-10-CM

## 2020-01-05 DIAGNOSIS — R7309 Other abnormal glucose: Secondary | ICD-10-CM

## 2020-01-05 DIAGNOSIS — G4700 Insomnia, unspecified: Secondary | ICD-10-CM | POA: Insufficient documentation

## 2020-01-05 MED ORDER — TRAZODONE HCL 50 MG PO TABS: 50.0000 mg | ORAL_TABLET | Freq: Every day | ORAL | 3 refills | Status: DC

## 2020-01-05 NOTE — Assessment & Plan Note (Addendum)
Recheck this on outpatient labs. Initiate treatment as needed.

## 2020-01-05 NOTE — Assessment & Plan Note (Signed)
Ongoing, continue collaboration with ortho and pain management.  Encouraged patient not to miss upcoming pain management visit, as this will be beneficial for overall pain.

## 2020-01-05 NOTE — Assessment & Plan Note (Addendum)
Chronic, ongoing.  Elevation today noted, but patient very anxious and in pain.  Continue Losartan 100 MG daily and adjust regimen as needed.  Will obtain outpatient labs.  Consider addition of Amlodipine if ongoing elevations once pain under control, reassess after pain management visits.  Return in 3 weeks.

## 2020-01-05 NOTE — Progress Notes (Signed)
BP (!) 168/99   Pulse 93   Ht 5\' 2"  (1.575 m)   Wt 170 lb (77.1 kg)   BMI 31.09 kg/m    Subjective:    Patient ID: Catherine Giles, female    DOB: January 26, 1971, 49 y.o.   MRN: [id number]  HPI: Catherine Giles is a 49 y.o. female  Chief Complaint  Patient presents with  . Referral    to dermatogy and podiatry   . Labs Only    pt requested blood work to check sugars and vitamin D    . This visit was completed via telephone due to the restrictions of the COVID-19 pandemic. All issues as above were discussed and addressed but no physical exam was performed. If it was felt that the patient should be evaluated in the office, they were directed there. The patient verbally consented to this visit. Patient was unable to complete an audio/visual visit due to Technical difficulties,Lack of internet. Due to the catastrophic nature of the COVID-19 pandemic, this visit was done through audio contact only. . Location of the patient: home . Location of the provider: work . Those involved with this call:  . Provider: Marnee Guarneri, DNP . CMA: Yvonna Alanis, CMA . Front Desk/Registration: Don Perking  . Time spent on call: 15 minutes on the phone discussing health concerns. 10 minutes total spent in review of patient's record and preparation of their chart. I verified patient identity using two factors (patient name and date of birth). Patient consents verbally to being seen via telemedicine visit today.    PAIN IN LEFT HAND Went for dental procedure 12/05/2019, oral surgeon in [place].  Presented for tooth extraction.  She saw orthopedics and was to see pain clinic this morning at 0800, but missed appointment and is rescheduled for Wednesday. Currently taking Gabapentin, but not beneficial.  Continues to have ongoing pain to dorsal aspect hand and decreased mobility, describes this as her hand being on fire into her fingers.  She is right hand dominant.  Can not perform any ADLs -- burning  pain.  Reports he mental health has been affected by the pain and can not sleep.  Has Buspar on her medication list, takes 10 MG daily. At baseline has underlying RA, Lupus, and fibromyalgia.  Is followed by rheumatology, last seen on 08/26/2019 by Dr. Annalee Genta.  Currently on multiple medications for this to include: Plaquenil, Arava, Tizanidine, Flexeril, Prednisone, and Gabapentin. Duration: weeks Involved hand: left Mechanism of injury: trauma Location: diffuse Onset: gradual Severity: 10/10  Quality: burning, shooting and stabbing, burning like a torch going through her hand Frequency: constant Radiation: no Aggravating factors:  Alleviating factors:  Treatments attempted:  Relief with NSAIDs?: takes 800 MG Ibuprofen for pain Weakness: yes Numbness: yes Redness: no Swelling:no Bruising: no Fevers: no  REFERRAL NEEDS AND LABS: She reports she needs a referral to dermatology due to skin issues with lupus + podiatry for a callus on right foot that is hurting her, has been present for years and she has not been able to treat it at home.  She missed physical exam visit last week, reports she had called office to confirm but was told she did not have an appointment, would like labs obtain and is agreeable to a physical in a few weeks.  Relevant past medical, surgical, family and social history reviewed and updated as indicated. Interim medical history since our last visit reviewed. Allergies and medications reviewed and updated.  Review of Systems  Constitutional: Negative for  activity change, appetite change, diaphoresis, fatigue and fever.  Respiratory: Negative for cough, chest tightness, shortness of breath and wheezing.   Cardiovascular: Negative for chest pain, palpitations and leg swelling.  Gastrointestinal: Negative.   Musculoskeletal: Positive for arthralgias.  Neurological: Negative.   Psychiatric/Behavioral: Positive for decreased concentration and sleep disturbance.  Negative for self-injury and suicidal ideas. The patient is nervous/anxious.     Per HPI unless specifically indicated above     Objective:    BP (!) 168/99   Pulse 93   Ht 5\' 2"  (1.575 m)   Wt 170 lb (77.1 kg)   BMI 31.09 kg/m   Wt Readings from Last 3 Encounters:  01/05/20 170 lb (77.1 kg)  12/16/19 170 lb 6.4 oz (77.3 kg)  12/08/19 166 lb (75.3 kg)    Physical Exam   Unable to perform due to telephone visit only  Results for orders placed or performed in visit on 12/16/19  CBC with Differential/Platelet  Result Value Ref Range   WBC 12.2 (H) 3.4 - 10.8 x10E3/uL   RBC 4.69 3.77 - 5.28 x10E6/uL   Hemoglobin 14.8 11.1 - 15.9 g/dL   Hematocrit 44.2 34.0 - 46.6 %   MCV 94 79 - 97 fL   MCH 31.6 26.6 - 33.0 pg   MCHC 33.5 31.5 - 35.7 g/dL   RDW 12.6 11.7 - 15.4 %   Platelets 437 150 - 450 x10E3/uL   Neutrophils 39 Not Estab. %   Lymphs 51 Not Estab. %   Monocytes 8 Not Estab. %   Eos 1 Not Estab. %   Basos 1 Not Estab. %   Neutrophils Absolute 4.8 1.4 - 7.0 x10E3/uL   Lymphocytes Absolute 6.2 (H) 0.7 - 3.1 x10E3/uL   Monocytes Absolute 0.9 0.1 - 0.9 x10E3/uL   EOS (ABSOLUTE) 0.2 0.0 - 0.4 x10E3/uL   Basophils Absolute 0.1 0.0 - 0.2 x10E3/uL   Immature Granulocytes 0 Not Estab. %   Immature Grans (Abs) 0.0 0.0 - 0.1 x10E3/uL  Comprehensive metabolic panel  Result Value Ref Range   Glucose 79 65 - 99 mg/dL   BUN 17 6 - 24 mg/dL   Creatinine, Ser 0.66 0.57 - 1.00 mg/dL   GFR calc non Af Amer 103 >59 mL/min/1.73   GFR calc Af Amer 119 >59 mL/min/1.73   BUN/Creatinine Ratio 26 (H) 9 - 23   Sodium 141 134 - 144 mmol/L   Potassium 4.3 3.5 - 5.2 mmol/L   Chloride 105 96 - 106 mmol/L   CO2 21 20 - 29 mmol/L   Calcium 9.7 8.7 - 10.2 mg/dL   Total Protein 6.9 6.0 - 8.5 g/dL   Albumin 4.2 3.8 - 4.8 g/dL   Globulin, Total 2.7 1.5 - 4.5 g/dL   Albumin/Globulin Ratio 1.6 1.2 - 2.2   Bilirubin Total 0.2 0.0 - 1.2 mg/dL   Alkaline Phosphatase 86 39 - 117 IU/L   AST 26 0 -  40 IU/L   ALT 29 0 - 32 IU/L  Uric acid  Result Value Ref Range   Uric Acid 4.8 2.6 - 6.2 mg/dL      Assessment & Plan:   Problem List Items Addressed This Visit      Cardiovascular and Mediastinum   Essential hypertension    Chronic, ongoing.  Elevation today noted, but patient very anxious and in pain.  Continue Losartan 100 MG daily and adjust regimen as needed.  Will obtain outpatient labs.  Consider addition of Amlodipine if ongoing elevations once  pain under control, reassess after pain management visits.  Return in 3 weeks.        Endocrine   Hypothyroid   Relevant Orders   TSH     Musculoskeletal and Integument   Rheumatoid arthritis involving multiple sites with positive rheumatoid factor (HCC)   Relevant Orders   Comprehensive metabolic panel     Other   Undifferentiated connective tissue disease ([place]) - Primary   Relevant Orders   Ambulatory referral to Dermatology   Vitamin D deficiency    Chronic, check Vit D level outpatient and continue supplement.      Relevant Orders   VITAMIN D 25 Hydroxy (Vit-D Deficiency, Fractures)   Elevated hemoglobin A1c    Recheck this on outpatient labs. Initiate treatment as needed.      Relevant Orders   HgB A1c   Pain of left hand    Ongoing, continue collaboration with ortho and pain management.  Encouraged patient not to miss upcoming pain management visit, as this will be beneficial for overall pain.      Relevant Orders   Comprehensive metabolic panel   CBC with Differential/Platelet   Insomnia    New, related to hand injury and loss of function.  Will start Trazodone 50 MG QHS and adjust as needed, this may be beneficial for mood and sleep pattern.  Recommend patient attend upcoming pain management visit to assist with overall pain and quality of life.  Return in 3 weeks.       Other Visit Diagnoses    Callus       Referral to podiatry per patient request.   Relevant Orders   Ambulatory referral to Podiatry     Screening cholesterol level       Check lipid panel outpatient.   Relevant Orders   Lipid Panel w/o Chol/HDL Ratio      I discussed the assessment and treatment plan with the patient. The patient was provided an opportunity to ask questions and all were answered. The patient agreed with the plan and demonstrated an understanding of the instructions.   The patient was advised to call back or seek an in-person evaluation if the symptoms worsen or if the condition fails to improve as anticipated.   I provided 15+ minutes of time during this encounter.  Follow up plan: Return in about 3 weeks (around 01/26/2020) for Annual physical.

## 2020-01-05 NOTE — Assessment & Plan Note (Signed)
Chronic, check Vit D level outpatient and continue supplement.

## 2020-01-05 NOTE — Patient Instructions (Signed)
Neuropathic Pain Neuropathic pain is pain caused by damage to the nerves that are responsible for certain sensations in your body (sensory nerves). The pain can be caused by:  Damage to the sensory nerves that send signals to your spinal cord and brain (peripheral nervous system).  Damage to the sensory nerves in your brain or spinal cord (central nervous system). Neuropathic pain can make you more sensitive to pain. Even a minor sensation can feel very painful. This is usually a long-term condition that can be difficult to treat. The type of pain differs from person to person. It may:  Start suddenly (acute), or it may develop slowly and last for a long time (chronic).  Come and go as damaged nerves heal, or it may stay at the same level for years.  Cause emotional distress, loss of sleep, and a lower quality of life. What are the causes? The most common cause of this condition is diabetes. Many other diseases and conditions can also cause neuropathic pain. Causes of neuropathic pain can be classified as:  Toxic. This is caused by medicines and chemicals. The most common cause of toxic neuropathic pain is damage from cancer treatments (chemotherapy).  Metabolic. This can be caused by: ? Diabetes. This is the most common disease that damages the nerves. ? Lack of vitamin B from long-term alcohol abuse.  Traumatic. Any injury that cuts, crushes, or stretches a nerve can cause damage and pain. A common example is feeling pain after losing an arm or leg (phantom limb pain).  Compression-related. If a sensory nerve gets trapped or compressed for a long period of time, the blood supply to the nerve can be cut off.  Vascular. Many blood vessel diseases can cause neuropathic pain by decreasing blood supply and oxygen to nerves.  Autoimmune. This type of pain results from diseases in which the body's defense system (immune system) mistakenly attacks sensory nerves. Examples of autoimmune diseases  that can cause neuropathic pain include lupus and multiple sclerosis.  Infectious. Many types of viral infections can damage sensory nerves and cause pain. Shingles infection is a common cause of this type of pain.  Inherited. Neuropathic pain can be a symptom of many diseases that are passed down through families (genetic). What increases the risk? You are more likely to develop this condition if:  You have diabetes.  You smoke.  You drink too much alcohol.  You are taking certain medicines, including medicines that kill cancer cells (chemotherapy) or that treat immune system disorders. What are the signs or symptoms? The main symptom is pain. Neuropathic pain is often described as:  Burning.  Shock-like.  Stinging.  Hot or cold.  Itching. How is this diagnosed? No single test can diagnose neuropathic pain. It is diagnosed based on:  Physical exam and your symptoms. Your health care provider will ask you about your pain. You may be asked to use a pain scale to describe how bad your pain is.  Tests. These may be done to see if you have a high sensitivity to pain and to help find the cause and location of any sensory nerve damage. They include: ? Nerve conduction studies to test how well nerve signals travel through your sensory nerves (electrodiagnostic testing). ? Stimulating your sensory nerves through electrodes on your skin and measuring the response in your spinal cord and brain (somatosensory evoked potential).  Imaging studies, such as: ? X-rays. ? CT scan. ? MRI. How is this treated? Treatment for neuropathic pain may change   over time. You may need to try different treatment options or a combination of treatments. Some options include:  Treating the underlying cause of the neuropathy, such as diabetes, kidney disease, or vitamin deficiencies.  Stopping medicines that can cause neuropathy, such as chemotherapy.  Medicine to relieve pain. Medicines may  include: ? Prescription or over-the-counter pain medicine. ? Anti-seizure medicine. ? Antidepressant medicines. ? Pain-relieving patches that are applied to painful areas of skin. ? A medicine to numb the area (local anesthetic), which can be injected as a nerve block.  Transcutaneous nerve stimulation. This uses electrical currents to block painful nerve signals. The treatment is painless.  Alternative treatments, such as: ? Acupuncture. ? Meditation. ? Massage. ? Physical therapy. ? Pain management programs. ? Counseling. Follow these instructions at home: Medicines   Take over-the-counter and prescription medicines only as told by your health care provider.  Do not drive or use heavy machinery while taking prescription pain medicine.  If you are taking prescription pain medicine, take actions to prevent or treat constipation. Your health care provider may recommend that you: ? Drink enough fluid to keep your urine pale yellow. ? Eat foods that are high in fiber, such as fresh fruits and vegetables, whole grains, and beans. ? Limit foods that are high in fat and processed sugars, such as fried or sweet foods. ? Take an over-the-counter or prescription medicine for constipation. Lifestyle   Have a good support system at home.  Consider joining a chronic pain support group.  Do not use any products that contain nicotine or tobacco, such as cigarettes and e-cigarettes. If you need help quitting, ask your health care provider.  Do not drink alcohol. General instructions  Learn as much as you can about your condition.  Work closely with all your health care providers to find the treatment plan that works best for you.  Ask your health care provider what activities are safe for you.  Keep all follow-up visits as told by your health care provider. This is important. Contact a health care provider if:  Your pain treatments are not working.  You are having side effects  from your medicines.  You are struggling with tiredness (fatigue), mood changes, depression, or anxiety. Summary  Neuropathic pain is pain caused by damage to the nerves that are responsible for certain sensations in your body (sensory nerves).  Neuropathic pain may come and go as damaged nerves heal, or it may stay at the same level for years.  Neuropathic pain is usually a long-term condition that can be difficult to treat. Consider joining a chronic pain support group. This information is not intended to replace advice given to you by your health care provider. Make sure you discuss any questions you have with your health care provider. Document Revised: 01/30/2019 Document Reviewed: 10/26/2017 Elsevier Patient Education  2020 [employer] Inc[employer]  

## 2020-01-05 NOTE — Assessment & Plan Note (Signed)
New, related to hand injury and loss of function.  Will start Trazodone 50 MG QHS and adjust as needed, this may be beneficial for mood and sleep pattern.  Recommend patient attend upcoming pain management visit to assist with overall pain and quality of life.  Return in 3 weeks.

## 2020-01-12 ENCOUNTER — Other Ambulatory Visit: Payer: Medicare Other

## 2020-01-22 ENCOUNTER — Other Ambulatory Visit: Payer: Self-pay

## 2020-01-22 ENCOUNTER — Ambulatory Visit (INDEPENDENT_AMBULATORY_CARE_PROVIDER_SITE_OTHER): Payer: Medicare Other | Admitting: Podiatry

## 2020-01-22 DIAGNOSIS — M79671 Pain in right foot: Secondary | ICD-10-CM

## 2020-01-22 DIAGNOSIS — Q828 Other specified congenital malformations of skin: Secondary | ICD-10-CM

## 2020-01-26 ENCOUNTER — Encounter: Payer: Self-pay | Admitting: Podiatry

## 2020-01-26 NOTE — Progress Notes (Signed)
Subjective:  Patient ID: Catherine Giles, female    DOB: 1971-04-15,  MRN: [id number]  Chief Complaint  Patient presents with  . Callouses    pt is here for a possible callous trim of the right foot.    49 y.o. female presents with the above complaint.  Patient presents with right submetatarsal 5 porokeratosis.  Patient states that this has been going on for quite some time has progressively gotten worse.  She has not tried any treatment option aside from over-the-counter.  She has tried some pumice stone to help debride this down.  She would like to know if there is something else that could be done.  She denies any other acute complaints.  She has not tried offloading this lesion as well.   Review of Systems: Negative except as noted in the HPI. Denies N/V/F/Ch.  Past Medical History:  Diagnosis Date  . Arthritis    RA  . COPD (chronic obstructive pulmonary disease) ([place])   . Emphysema of lung ([place])   . Hypertension   . Lupus ([place])   . Thyroid disease     Current Outpatient Medications:  .  albuterol (VENTOLIN HFA) 108 (90 Base) MCG/ACT inhaler, Inhale into the lungs., Disp: , Rfl:  .  busPIRone (BUSPAR) 10 MG tablet, Take by mouth., Disp: , Rfl:  .  cyclobenzaprine (FLEXERIL) 10 MG tablet, Take by mouth., Disp: , Rfl:  .  fluticasone furoate-vilanterol (BREO ELLIPTA) 200-25 MCG/INH AEPB, Inhale into the lungs., Disp: , Rfl:  .  gabapentin (NEURONTIN) 300 MG capsule, Take 1 capsule (300 mg total) by mouth 3 (three) times daily., Disp: 90 capsule, Rfl: 3 .  hydroxychloroquine (PLAQUENIL) 200 MG tablet, 1 tab BID, 90 days, #180 tabs, Disp: , Rfl:  .  ibuprofen (ADVIL) 600 MG tablet, Take by mouth., Disp: , Rfl:  .  ipratropium-albuterol (DUONEB) 0.5-2.5 (3) MG/3ML SOLN, Take 3 mLs by nebulization every 6 (six) hours as needed., Disp: 75 mL, Rfl: 0 .  leflunomide (ARAVA) 20 MG tablet, Take by mouth., Disp: , Rfl:  .  levothyroxine (SYNTHROID) 50 MCG tablet, Take by mouth., Disp: ,  Rfl:  .  LORazepam (ATIVAN) 1 MG tablet, Take 1 tablet (1 mg total) by mouth every 8 (eight) hours as needed for anxiety., Disp: 10 tablet, Rfl: 0 .  losartan (COZAAR) 100 MG tablet, Take by mouth., Disp: , Rfl:  .  predniSONE (DELTASONE) 20 MG tablet, Take 20 mg by mouth daily with breakfast., Disp: , Rfl:  .  tiZANidine (ZANAFLEX) 4 MG tablet, Take by mouth., Disp: , Rfl:  .  traZODone (DESYREL) 50 MG tablet, Take 1 tablet (50 mg total) by mouth at bedtime., Disp: 30 tablet, Rfl: 3 .  umeclidinium-vilanterol (ANORO ELLIPTA) 62.5-25 MCG/INH AEPB, Inhale into the lungs., Disp: , Rfl:   Social History   Tobacco Use  Smoking Status Current Every Day Smoker  . Packs/day: 1.00  . Years: 34.00  . Pack years: 34.00  . Types: Cigarettes  Smokeless Tobacco Never Used    Allergies  Allergen Reactions  . Azithromycin Anaphylaxis  . Penicillins   . Shellfish Allergy    Objective:  There were no vitals filed for this visit. There is no height or weight on file to calculate BMI. Constitutional Well developed. Well nourished.  Vascular Dorsalis pedis pulses palpable bilaterally. Posterior tibial pulses palpable bilaterally. Capillary refill normal to all digits.  No cyanosis or clubbing noted. Pedal hair growth normal.  Neurologic Normal speech. Oriented to person,  place, and time. Epicritic sensation to light touch grossly present bilaterally.  Dermatologic  right submetatarsal 5 porokeratosis/benign skin lesion.  Pain on palpation to the lesion.  Central nucleated core noted.  Upon debridement no pinpoint bleeding noted  Orthopedic: Normal joint ROM without pain or crepitus bilaterally. No visible deformities. No bony tenderness.   Radiographs: None Assessment:   1. Porokeratosis   2. Pain in right foot    Plan:  Patient was evaluated and treated and all questions answered.  Right submet 5 porokeratosis/benign skin lesion -I explained to the patient the etiology of  porokeratosis/skin lesion and various treatment options were discussed.  I explained to the patient that the pain is associated with the weightbearing pressure on a reactive submetatarsal 5 lesion.  I believe patient will benefit from aggressive debridement with offloading. -Using a chisel blade and a handle, the lesion was debrided down to healthy striated tissue.  No pinpoint bleeding noted no complication noted. -Offloading pad was dispensed.  If patient has adequate relief we will consider making orthotics or discuss orthotics for the patient  No follow-ups on file.

## 2020-02-02 ENCOUNTER — Encounter: Payer: Medicare Other | Admitting: Nurse Practitioner

## 2020-02-13 ENCOUNTER — Other Ambulatory Visit: Payer: Self-pay | Admitting: Student

## 2020-02-13 DIAGNOSIS — M5412 Radiculopathy, cervical region: Secondary | ICD-10-CM

## 2020-02-17 ENCOUNTER — Encounter: Payer: Medicare Other | Admitting: Nurse Practitioner

## 2020-03-01 ENCOUNTER — Ambulatory Visit: Payer: Medicare Other

## 2020-03-11 ENCOUNTER — Ambulatory Visit: Payer: Medicare Other

## 2020-03-11 ENCOUNTER — Other Ambulatory Visit: Payer: Self-pay

## 2020-03-11 ENCOUNTER — Ambulatory Visit
Admission: RE | Admit: 2020-03-11 | Discharge: 2020-03-11 | Disposition: A | Discharge status: Home / Self Care | Payer: Medicare Other | Source: Ambulatory Visit | Attending: Student | Admitting: Student

## 2020-03-11 DIAGNOSIS — M5412 Radiculopathy, cervical region: Secondary | ICD-10-CM | POA: Diagnosis not present

## 2020-03-15 ENCOUNTER — Telehealth: Payer: Self-pay | Admitting: Nurse Practitioner

## 2020-03-15 NOTE — Telephone Encounter (Signed)
Please see if we can schedule for office visit prior to referral so we can discuss issue first.  Thank you.

## 2020-03-15 NOTE — Telephone Encounter (Signed)
Copied from [place] [id number]. Topic: Referral - Request for Referral >> Mar 15, 2020  8:23 AM Keene Breath wrote: Has patient seen PCP for this complaint? yes *If NO, is insurance requiring patient see PCP for this issue before PCP can refer them? Referral for which specialty: Gynecologist Preferred provider/office: Patient stated she would like whoever doctor recommends Reason for referral: Patient having pain and did not state any other specific symptoms

## 2020-03-15 NOTE — Telephone Encounter (Signed)
Routing to provider  

## 2020-03-15 NOTE — Telephone Encounter (Signed)
Called patient, no answer, unable to leave a message, will try again.   

## 2020-03-16 ENCOUNTER — Ambulatory Visit: Payer: Medicare Other | Admitting: Podiatry

## 2020-03-16 NOTE — Telephone Encounter (Signed)
Called patient and LVM for patient to return call to the office.

## 2020-03-17 NOTE — Telephone Encounter (Signed)
Please advise 

## 2020-03-17 NOTE — Telephone Encounter (Signed)
That referral was for pain in her left hand, not for GYN referral.  We will discuss this more at visit Friday and I can then place referral at that time.

## 2020-03-17 NOTE — Telephone Encounter (Signed)
Pt scheduled for this Friday but Pt stated she has already seen Jolene for a referral in march. /please advise

## 2020-03-19 ENCOUNTER — Encounter: Payer: Self-pay | Admitting: Nurse Practitioner

## 2020-03-19 ENCOUNTER — Other Ambulatory Visit: Payer: Self-pay

## 2020-03-19 ENCOUNTER — Ambulatory Visit (INDEPENDENT_AMBULATORY_CARE_PROVIDER_SITE_OTHER): Payer: Medicare Other | Admitting: Nurse Practitioner

## 2020-03-19 VITALS — BP 134/92 | HR 94 | Temp 98.3°F | Wt 167.6 lb

## 2020-03-19 DIAGNOSIS — N895 Stricture and atresia of vagina: Secondary | ICD-10-CM | POA: Insufficient documentation

## 2020-03-19 DIAGNOSIS — N898 Other specified noninflammatory disorders of vagina: Secondary | ICD-10-CM | POA: Insufficient documentation

## 2020-03-19 DIAGNOSIS — Z6830 Body mass index (BMI) 30.0-30.9, adult: Secondary | ICD-10-CM

## 2020-03-19 DIAGNOSIS — E669 Obesity, unspecified: Secondary | ICD-10-CM | POA: Insufficient documentation

## 2020-03-19 DIAGNOSIS — E6609 Other obesity due to excess calories: Secondary | ICD-10-CM | POA: Diagnosis not present

## 2020-03-19 LAB — WET PREP FOR TRICH, YEAST, CLUE
Clue Cell Exam: POSITIVE — AB
Trichomonas Exam: NEGATIVE
Yeast Exam: NEGATIVE

## 2020-03-19 MED ORDER — CLINDAMYCIN HCL 300 MG PO CAPS: 300.0000 mg | ORAL_CAPSULE | Freq: Two times a day (BID) | ORAL | 0 refills | Status: AC

## 2020-03-19 NOTE — Assessment & Plan Note (Signed)
New onset with a reported history of vaginal stenosis.  She wishes to return to GYN, will place this referral.  Wet prep today + clue cells, will treat with Clindamycin -- script sent.  Educated patient on this and medication.  Recommend to return to office for worsening or ongoing symptoms.

## 2020-03-19 NOTE — Progress Notes (Signed)
BP (!) 134/92   Pulse 94   Temp 98.3 F (36.8 C) (Oral)   Wt 167 lb 9.6 oz (76 kg)   SpO2 98%   BMI 30.65 kg/m    Subjective:    Patient ID: Catherine Giles, female    DOB: 1970/10/26, 49 y.o.   MRN: [id number]  HPI: Catherine Giles is a 49 y.o. female  Chief Complaint  Patient presents with  . Vaginal Stenosis    pt states she needs a referral to [facility] [place] Diagnosed years, > 10 years ago.  Previously saw Dr. Claiborne Billings in [facility].  Reports discomfort and odor which will present with flares and has to go to GYN, as a lot of medications do not work for her.  Has only been able to use Clindamycin in past with benefit and states Flagyl makes her sick.  Reports no discomfort with intercourse, not sexually active at this time.  Reports feeling nauseous today and overall drained.  She is a G9 P5 (she lost one child 9 years ago).  All of her births were vaginal.  She reports being told she had some sort of prolapse years ago as well and reports periods of urgency and frequency + dribbling.  Requests to return to GYN for further assessment and work-up.  Relevant past medical, surgical, family and social history reviewed and updated as indicated. Interim medical history since our last visit reviewed. Allergies and medications reviewed and updated.  Review of Systems  Constitutional: Negative for activity change, appetite change, diaphoresis, fatigue and fever.  Respiratory: Negative for cough, chest tightness and shortness of breath.   Cardiovascular: Negative for chest pain, palpitations and leg swelling.  Gastrointestinal: Negative for abdominal distention, abdominal pain, constipation, diarrhea, nausea and vomiting.  Genitourinary: Positive for vaginal discharge. Negative for pelvic pain and vaginal bleeding.  Neurological: Negative.   Psychiatric/Behavioral: Negative.     Per HPI unless specifically indicated above     Objective:    BP (!) 134/92   Pulse 94   Temp  98.3 F (36.8 C) (Oral)   Wt 167 lb 9.6 oz (76 kg)   SpO2 98%   BMI 30.65 kg/m   Wt Readings from Last 3 Encounters:  03/19/20 167 lb 9.6 oz (76 kg)  01/05/20 170 lb (77.1 kg)  12/16/19 170 lb 6.4 oz (77.3 kg)    Physical Exam Vitals and nursing note reviewed.  Constitutional:      General: She is awake. She is not in acute distress.    Appearance: She is well-developed and well-groomed. She is obese. She is not ill-appearing.  HENT:     Head: Normocephalic.     Right Ear: Hearing normal.     Left Ear: Hearing normal.     Mouth/Throat:     Mouth: Mucous membranes are moist.  Eyes:     General: Lids are normal.        Right eye: No discharge.        Left eye: No discharge.     Conjunctiva/sclera: Conjunctivae normal.     Pupils: Pupils are equal, round, and reactive to light.  Neck:     Thyroid: No thyromegaly.     Vascular: No carotid bruit.  Cardiovascular:     Rate and Rhythm: Normal rate and regular rhythm.     Heart sounds: Normal heart sounds. No murmur. No gallop.   Pulmonary:     Effort: Pulmonary effort is normal. No accessory muscle usage  or respiratory distress.     Breath sounds: Normal breath sounds.  Abdominal:     General: Bowel sounds are normal. There is no distension.     Palpations: Abdomen is soft.     Tenderness: There is no abdominal tenderness.  Genitourinary:    Comments: Deferred pelvic per patient request, self swab performed. Musculoskeletal:     Cervical back: Normal range of motion and neck supple.     Right lower leg: No edema.     Left lower leg: No edema.  Skin:    General: Skin is warm and dry.  Neurological:     Mental Status: She is alert and oriented to person, place, and time.  Psychiatric:        Attention and Perception: Attention normal.        Mood and Affect: Mood normal.        Speech: Speech normal.        Behavior: Behavior normal. Behavior is cooperative.        Thought Content: Thought content normal.      Results for orders placed or performed in visit on 12/16/19  CBC with Differential/Platelet  Result Value Ref Range   WBC 12.2 (H) 3.4 - 10.8 x10E3/uL   RBC 4.69 3.77 - 5.28 x10E6/uL   Hemoglobin 14.8 11.1 - 15.9 g/dL   Hematocrit 44.2 34.0 - 46.6 %   MCV 94 79 - 97 fL   MCH 31.6 26.6 - 33.0 pg   MCHC 33.5 31.5 - 35.7 g/dL   RDW 12.6 11.7 - 15.4 %   Platelets 437 150 - 450 x10E3/uL   Neutrophils 39 Not Estab. %   Lymphs 51 Not Estab. %   Monocytes 8 Not Estab. %   Eos 1 Not Estab. %   Basos 1 Not Estab. %   Neutrophils Absolute 4.8 1.4 - 7.0 x10E3/uL   Lymphocytes Absolute 6.2 (H) 0.7 - 3.1 x10E3/uL   Monocytes Absolute 0.9 0.1 - 0.9 x10E3/uL   EOS (ABSOLUTE) 0.2 0.0 - 0.4 x10E3/uL   Basophils Absolute 0.1 0.0 - 0.2 x10E3/uL   Immature Granulocytes 0 Not Estab. %   Immature Grans (Abs) 0.0 0.0 - 0.1 x10E3/uL  Comprehensive metabolic panel  Result Value Ref Range   Glucose 79 65 - 99 mg/dL   BUN 17 6 - 24 mg/dL   Creatinine, Ser 0.66 0.57 - 1.00 mg/dL   GFR calc non Af Amer 103 >59 mL/min/1.73   GFR calc Af Amer 119 >59 mL/min/1.73   BUN/Creatinine Ratio 26 (H) 9 - 23   Sodium 141 134 - 144 mmol/L   Potassium 4.3 3.5 - 5.2 mmol/L   Chloride 105 96 - 106 mmol/L   CO2 21 20 - 29 mmol/L   Calcium 9.7 8.7 - 10.2 mg/dL   Total Protein 6.9 6.0 - 8.5 g/dL   Albumin 4.2 3.8 - 4.8 g/dL   Globulin, Total 2.7 1.5 - 4.5 g/dL   Albumin/Globulin Ratio 1.6 1.2 - 2.2   Bilirubin Total 0.2 0.0 - 1.2 mg/dL   Alkaline Phosphatase 86 39 - 117 IU/L   AST 26 0 - 40 IU/L   ALT 29 0 - 32 IU/L  Uric acid  Result Value Ref Range   Uric Acid 4.8 2.6 - 6.2 mg/dL      Assessment & Plan:   Problem List Items Addressed This Visit      Genitourinary   Vaginal stenosis    Patient reports she was diagnosed 10 years  ago with this, would like to return to GYN for further assessment of this and a "prolapse" she once had.  Referral placed.      Relevant Orders   Ambulatory referral to  Gynecology     Other   Vaginal discharge - Primary    New onset with a reported history of vaginal stenosis.  She wishes to return to GYN, will place this referral.  Wet prep today + clue cells, will treat with Clindamycin -- script sent.  Educated patient on this and medication.  Recommend to return to office for worsening or ongoing symptoms.      Relevant Orders   WET PREP FOR [place], YEAST, CLUE   Obesity    Recommended eating smaller high protein, low fat meals more frequently and exercising 30 mins a day 5 times a week with a goal of 10-15lb weight loss in the next 3 months. Patient voiced their understanding and motivation to adhere to these recommendations.           Follow up plan: Return if symptoms worsen or fail to improve.

## 2020-03-19 NOTE — Assessment & Plan Note (Signed)
Recommended eating smaller high protein, low fat meals more frequently and exercising 30 mins a day 5 times a week with a goal of 10-15lb weight loss in the next 3 months. Patient voiced their understanding and motivation to adhere to these recommendations.  

## 2020-03-19 NOTE — Assessment & Plan Note (Signed)
Patient reports she was diagnosed 10 years ago with this, would like to return to GYN for further assessment of this and a "prolapse" she once had.  Referral placed.

## 2020-03-19 NOTE — Patient Instructions (Signed)

## 2020-03-23 ENCOUNTER — Other Ambulatory Visit: Payer: Self-pay

## 2020-03-23 ENCOUNTER — Ambulatory Visit (INDEPENDENT_AMBULATORY_CARE_PROVIDER_SITE_OTHER): Payer: Medicare Other | Admitting: Dermatology

## 2020-03-23 DIAGNOSIS — Z872 Personal history of diseases of the skin and subcutaneous tissue: Secondary | ICD-10-CM

## 2020-03-23 DIAGNOSIS — L281 Prurigo nodularis: Secondary | ICD-10-CM | POA: Diagnosis not present

## 2020-03-23 DIAGNOSIS — S40861A Insect bite (nonvenomous) of right upper arm, initial encounter: Secondary | ICD-10-CM

## 2020-03-23 DIAGNOSIS — W57XXXA Bitten or stung by nonvenomous insect and other nonvenomous arthropods, initial encounter: Secondary | ICD-10-CM | POA: Diagnosis not present

## 2020-03-23 DIAGNOSIS — L810 Postinflammatory hyperpigmentation: Secondary | ICD-10-CM

## 2020-03-23 MED ORDER — TRIAMCINOLONE ACETONIDE 0.1 % EX CREA: 1.0000 "application " | TOPICAL_CREAM | Freq: Every day | CUTANEOUS | 1 refills | Status: DC | PRN

## 2020-03-23 NOTE — Progress Notes (Signed)
   New Patient Visit  Subjective  Catherine Giles is a 49 y.o. female who presents for the following: New Patient (Initial Visit) (spots on Right forearm and left axilla  since friday, itch/ and irritated, Dark spot on back, B/L  arms and right leghave been ther for over year. ).  No new lesions but spots stay dark.  No itching.  Pt has h/o SLE.  The following portions of the chart were reviewed this encounter and updated as appropriate:      Review of Systems:  No other skin or systemic complaints except as noted in HPI or Assessment and Plan.  Objective  Well appearing patient in no apparent distress; mood and affect are within normal limits.  All skin waist up examined.  Objective  Bilateral Upper Arm and Right forearm: Pink edematous papules  Objective  Back, B/L arms, shoulders and R lower leg: Numerous hyperpigmented macules  Images        Objective  Left forearm: Hyperpigmented excoriated papules   Assessment & Plan  Insect bite of multiple sites of right upper arm with local reaction, initial encounter Bilateral Upper Arm and Right forearm  Can use TMC cream bid to aas prn itch.  Postinflammatory hyperpigmentation Back, B/L arms, shoulders and R lower leg  No active rash present today. Start TMC 0.1% cream Apply to affected areas twice daily to treat possible underlying mild inflammation. Avoid Face,Groin and underarms.  Discussed fade cream on f/up to persistent dark spots.  Recommend sunscreen/photoprotection when outdoors.  Discussed if new, active rash appears, will biopsy.  Topical steroids (such as triamcinolone, fluocinolone, fluocinonide, mometasone, clobetasol, halobetasol, betamethasone, hydrocortisone) can cause thinning and lightening of the skin if they are used for too long in the same area. Your physician has selected the right strength medicine for your problem and area affected on the body. Please use your medication only as directed by your  physician to prevent side effects.    triamcinolone cream (KENALOG) 0.1 % - Back, B/L arms, shoulders and R lower leg  Prurigo nodularis Left forearm  Start TMC 0.1% cream bid Avoid picking/scratching  Return for 4 to 6 weeks .  Marene Lenz, CMA, am acting as scribe for Brendolyn Patty, MD .  Documentation: I have reviewed the above documentation for accuracy and completeness, and I agree with the above.  Brendolyn Patty MD

## 2020-03-23 NOTE — Patient Instructions (Addendum)
Recommend daily broad spectrum sunscreen SPF 30+ to sun-exposed areas, reapply every 2 hours as needed. Call for new or changing lesions.  Topical steroids (such as triamcinolone, fluocinolone, fluocinonide, mometasone, clobetasol, halobetasol, betamethasone, hydrocortisone) can cause thinning and lightening of the skin if they are used for too long in the same area. Your physician has selected the right strength medicine for your problem and area affected on the body. Please use your medication only as directed by your physician to prevent side effects.   Gentle Skin Care Guide  1. Bathe no more than once a day.  2. Avoid bathing in hot water  3. Use a mild soap like Dove, Vanicream, Cetaphil, CeraVe. Can use Lever 2000 or Cetaphil antibacterial soap  4. Use soap only where you need it. On most days, use it under your arms, between your legs, and on your feet. Let the water rinse other areas unless visibly dirty.  5. When you get out of the bath/shower, use a towel to gently blot your skin dry, don't rub it.  6. While your skin is still a little damp, apply a moisturizing cream such as Vanicream, CeraVe, Cetaphil, Eucerin, Sarna lotion or plain Vaseline Jelly. For hands apply Neutrogena Norwegian Hand Cream or Excipial Hand Cream.  7. Reapply moisturizer any time you start to itch or feel dry.  8. Sometimes using free and clear laundry detergents can be helpful. Fabric softener sheets should be avoided. Downy Free & Gentle liquid, or any liquid fabric softener that is free of dyes and perfumes, it acceptable to use  9. If your doctor has given you prescription creams you may apply moisturizers over them     

## 2020-03-28 ENCOUNTER — Ambulatory Visit
Admission: EM | Admit: 2020-03-28 | Discharge: 2020-03-28 | Disposition: A | Discharge status: Home / Self Care | Payer: Medicare Other | Attending: Family Medicine | Admitting: Family Medicine

## 2020-03-28 ENCOUNTER — Other Ambulatory Visit: Payer: Self-pay

## 2020-03-28 DIAGNOSIS — Z20822 Contact with and (suspected) exposure to covid-19: Secondary | ICD-10-CM | POA: Diagnosis not present

## 2020-03-28 DIAGNOSIS — Z881 Allergy status to other antibiotic agents status: Secondary | ICD-10-CM | POA: Insufficient documentation

## 2020-03-28 DIAGNOSIS — Z7951 Long term (current) use of inhaled steroids: Secondary | ICD-10-CM | POA: Diagnosis not present

## 2020-03-28 DIAGNOSIS — Z880 Allergy status to penicillin: Secondary | ICD-10-CM | POA: Insufficient documentation

## 2020-03-28 DIAGNOSIS — J439 Emphysema, unspecified: Secondary | ICD-10-CM | POA: Insufficient documentation

## 2020-03-28 DIAGNOSIS — Z79899 Other long term (current) drug therapy: Secondary | ICD-10-CM | POA: Diagnosis not present

## 2020-03-28 DIAGNOSIS — I10 Essential (primary) hypertension: Secondary | ICD-10-CM | POA: Diagnosis not present

## 2020-03-28 DIAGNOSIS — E039 Hypothyroidism, unspecified: Secondary | ICD-10-CM | POA: Diagnosis not present

## 2020-03-28 DIAGNOSIS — R05 Cough: Secondary | ICD-10-CM | POA: Diagnosis present

## 2020-03-28 DIAGNOSIS — M329 Systemic lupus erythematosus, unspecified: Secondary | ICD-10-CM | POA: Diagnosis not present

## 2020-03-28 DIAGNOSIS — F17210 Nicotine dependence, cigarettes, uncomplicated: Secondary | ICD-10-CM | POA: Insufficient documentation

## 2020-03-28 DIAGNOSIS — Z79890 Hormone replacement therapy (postmenopausal): Secondary | ICD-10-CM | POA: Insufficient documentation

## 2020-03-28 DIAGNOSIS — Z7952 Long term (current) use of systemic steroids: Secondary | ICD-10-CM | POA: Insufficient documentation

## 2020-03-28 DIAGNOSIS — M0579 Rheumatoid arthritis with rheumatoid factor of multiple sites without organ or systems involvement: Secondary | ICD-10-CM | POA: Diagnosis not present

## 2020-03-28 DIAGNOSIS — M797 Fibromyalgia: Secondary | ICD-10-CM | POA: Insufficient documentation

## 2020-03-28 MED ORDER — HYDROCOD POLST-CPM POLST ER 10-8 MG/5ML PO SUER: 5.0000 mL | Freq: Two times a day (BID) | ORAL | 0 refills | Status: DC | PRN

## 2020-03-28 MED ORDER — DOXYCYCLINE HYCLATE 100 MG PO TABS
100.0000 mg | ORAL_TABLET | Freq: Two times a day (BID) | ORAL | 0 refills | Status: DC
Start: 2020-03-28 — End: 2020-04-05

## 2020-03-28 NOTE — ED Triage Notes (Signed)
Patient complains of cough, congestion, weakness x 3-4 days ago. Patient states that cough is worse at night and she would like to be tested for Covid.

## 2020-03-28 NOTE — ED Provider Notes (Signed)
[facility]    CSN: [id number] Arrival date & time: 03/28/20  0843      History   Chief Complaint Chief Complaint  Patient presents with  . Cough    HPI Catherine Giles is a 49 y.o. female.   49 yo female with a c/o 5 days of productive cough, congestion, and fatigue. Patients states she has emphysema and also takes immunosuppressive medications for RA, lupus. Denies any fevers, chills, states she's using her inhalers and taking some over the counter medications for symptoms. States she'd like to get tested for covid. She has not been vaccinated for covid yet.    Cough   Past Medical History:  Diagnosis Date  . Arthritis    RA  . COPD (chronic obstructive pulmonary disease) ([place])   . Emphysema of lung ([place])   . Hypertension   . Lupus ([place])   . Thyroid disease     Patient Active Problem List   Diagnosis Date Noted  . Vaginal discharge 03/19/2020  . Vaginal stenosis 03/19/2020  . Obesity 03/19/2020  . Insomnia 01/05/2020  . Complex regional pain syndrome I of upper limb 12/17/2019  . Pain of left hand 12/16/2019  . Essential hypertension 12/13/2019  . Centrilobular emphysema ([place]) 12/13/2019  . Elevated hemoglobin A1c 12/13/2019  . Hypothyroid 12/13/2019  . DDD (degenerative disc disease), lumbar 08/26/2019  . Multinodular goiter 08/14/2019  . Chronic left hip pain 05/05/2019  . Osteoarthritis of spine with radiculopathy, cervical region 05/05/2019  . Encounter for long-term (current) use of high-risk medication 02/14/2019  . Fibromyalgia 02/10/2019  . Rheumatoid arthritis involving multiple sites with positive rheumatoid factor ([place]) 02/10/2019  . Undifferentiated connective tissue disease ([place]) 02/10/2019  . Cervical radiculopathy 03/13/2018  . Vitamin D deficiency 10/11/2015    History reviewed. No pertinent surgical history.  OB History   No obstetric history on file.      Home Medications    Prior to Admission medications   Medication  Sig Start Date End Date Taking? Authorizing Provider  albuterol (VENTOLIN HFA) 108 (90 Base) MCG/ACT inhaler Inhale into the lungs. 02/12/19 03/28/20 Yes [provider]  busPIRone (BUSPAR) 10 MG tablet Take by mouth.   Yes [provider]  cyclobenzaprine (FLEXERIL) 10 MG tablet Take by mouth.   Yes [provider]  fluticasone furoate-vilanterol (BREO ELLIPTA) 200-25 MCG/INH AEPB Inhale into the lungs.   Yes [provider]  gabapentin (NEURONTIN) 300 MG capsule Take 1 capsule (300 mg total) by mouth 3 (three) times daily. 12/16/19  Yes Cannady, Jolene T, NP  ibuprofen (ADVIL) 600 MG tablet Take by mouth.   Yes [provider]  ipratropium-albuterol (DUONEB) 0.5-2.5 (3) MG/3ML SOLN Take 3 mLs by nebulization every 6 (six) hours as needed. 12/02/19  Yes Karen Kitchens, NP  leflunomide (ARAVA) 20 MG tablet Take by mouth. 05/05/19 05/04/20 Yes [provider]  levothyroxine (SYNTHROID) 50 MCG tablet Take by mouth.   Yes [provider]  LORazepam (ATIVAN) 1 MG tablet Take 1 tablet (1 mg total) by mouth every 8 (eight) hours as needed for anxiety. 04/29/19 04/28/20 Yes Merlyn Lot, MD  losartan (COZAAR) 100 MG tablet Take by mouth.   Yes [provider]  predniSONE (DELTASONE) 20 MG tablet Take 20 mg by mouth daily with breakfast.   Yes [provider]  tiZANidine (ZANAFLEX) 4 MG tablet Take by mouth.   Yes [provider]  traZODone (DESYREL) 50 MG tablet Take 1 tablet (50 mg total)  by mouth at bedtime. 01/05/20  Yes Cannady, Jolene T, NP  triamcinolone cream (KENALOG) 0.1 % Apply 1 application topically daily as needed. Apply to affected areas twice daily as needed. Avoid Face,Groin and underarms 03/23/20  Yes Brendolyn Patty, MD  umeclidinium-vilanterol [facility] ELLIPTA) 62.5-25 MCG/INH AEPB Inhale into the lungs. 02/12/19  Yes [provider]  chlorpheniramine-HYDROcodone (TUSSIONEX PENNKINETIC ER) 10-8 MG/5ML SUER  Take 5 mLs by mouth every 12 (twelve) hours as needed. 03/28/20   Norval Gable, MD  doxycycline (VIBRA-TABS) 100 MG tablet Take 1 tablet (100 mg total) by mouth 2 (two) times daily. 03/28/20   Norval Gable, MD    Family History Family History  Problem Relation Age of Onset  . Cancer Mother   . Lung cancer Mother   . Diabetes Mother   . Hypertension Father   . Cancer Maternal Aunt   . Ovarian cancer Maternal Aunt   . Diabetes Maternal Aunt   . Diabetes Maternal Uncle   . Hypertension Maternal Grandmother   . Diabetes Maternal Grandmother     Social History Social History   Tobacco Use  . Smoking status: Current Every Day Smoker    Packs/day: 1.00    Years: 34.00    Pack years: 34.00    Types: Cigarettes  . Smokeless tobacco: Never Used  Substance Use Topics  . Alcohol use: Yes  . Drug use: Never     Allergies   Azithromycin, Penicillins, and Shellfish allergy   Review of Systems Review of Systems  Respiratory: Positive for cough.      Physical Exam Triage Vital Signs ED Triage Vitals  Enc Vitals Group     BP 03/28/20 0915 (!) 163/99     Pulse Rate 03/28/20 0915 75     Resp 03/28/20 0915 18     Temp 03/28/20 0915 97.6 F (36.4 C)     Temp Source 03/28/20 0915 Oral     SpO2 03/28/20 0915 100 %     Weight 03/28/20 0912 171 lb (77.6 kg)     Height 03/28/20 0912 5\' 3"  (1.6 m)     Head Circumference --      Peak Flow --      Pain Score 03/28/20 0912 0     Pain Loc --      Pain Edu? --      Excl. in [place]? --    No data found.  Updated Vital Signs BP (!) 163/99 (BP Location: Left Arm)   Pulse 75   Temp 97.6 F (36.4 C) (Oral)   Resp 18   Ht 5\' 3"  (1.6 m)   Wt 77.6 kg   SpO2 100%   BMI 30.29 kg/m   Visual Acuity Right Eye Distance:   Left Eye Distance:   Bilateral Distance:    Right Eye Near:   Left Eye Near:    Bilateral Near:     Physical Exam Vitals and nursing note reviewed.  Constitutional:      General: She is not in acute  distress.    Appearance: She is not toxic-appearing or diaphoretic.  HENT:     Nose: Congestion present.  Cardiovascular:     Rate and Rhythm: Normal rate.     Heart sounds: Normal heart sounds.  Pulmonary:     Effort: Pulmonary effort is normal. No respiratory distress.     Breath sounds: No stridor. Rhonchi (few, diffuse) present. No wheezing or rales.  Musculoskeletal:     Cervical back: Neck supple.  Neurological:     Mental Status: She is alert.      UC Treatments / Results  Labs (all labs ordered are listed, but only abnormal results are displayed) Labs Reviewed  SARS CORONAVIRUS 2 (TAT 6-24 HRS)    EKG   Radiology No results found.  Procedures Procedures (including critical care time)  Medications Ordered in UC Medications - No data to display  Initial Impression / Assessment and Plan / UC Course  I have reviewed the triage vital signs and the nursing notes.  Pertinent labs & imaging results that were available during my care of the patient were reviewed by me and considered in my medical decision making (see chart for details).      Final Clinical Impressions(s) / UC Diagnoses   Final diagnoses:  Acute exacerbation of emphysema [facility])    ED Prescriptions    Medication Sig Dispense Auth. Provider   doxycycline (VIBRA-TABS) 100 MG tablet Take 1 tablet (100 mg total) by mouth 2 (two) times daily. 20 tablet Norval Gable, MD   chlorpheniramine-HYDROcodone [facility] ER) 10-8 MG/5ML SUER Take 5 mLs by mouth every 12 (twelve) hours as needed. 60 mL Norval Gable, MD      1. diagnosis reviewed with patient 2. rx as per orders above; reviewed possible side effects, interactions, risks and benefits  3. Recommend continue inhalers and otc medications as needed 4. Follow-up prn if symptoms worsen or don't improve   I have reviewed the PDMP during this encounter.   Norval Gable, MD 03/28/20 1025

## 2020-03-29 LAB — SARS CORONAVIRUS 2 (TAT 6-24 HRS): SARS Coronavirus 2: NEGATIVE

## 2020-04-01 ENCOUNTER — Ambulatory Visit (INDEPENDENT_AMBULATORY_CARE_PROVIDER_SITE_OTHER): Payer: Medicare Other | Admitting: Obstetrics and Gynecology

## 2020-04-01 ENCOUNTER — Other Ambulatory Visit: Payer: Self-pay

## 2020-04-01 ENCOUNTER — Encounter: Payer: Self-pay | Admitting: Obstetrics and Gynecology

## 2020-04-01 ENCOUNTER — Other Ambulatory Visit (HOSPITAL_COMMUNITY)
Admission: RE | Admit: 2020-04-01 | Discharge: 2020-04-01 | Disposition: A | Discharge status: Home / Self Care | Payer: Medicare Other | Source: Ambulatory Visit | Attending: Obstetrics and Gynecology | Admitting: Obstetrics and Gynecology

## 2020-04-01 VITALS — BP 157/103 | HR 96 | Ht 63.0 in | Wt 165.0 lb

## 2020-04-01 DIAGNOSIS — Z113 Encounter for screening for infections with a predominantly sexual mode of transmission: Secondary | ICD-10-CM

## 2020-04-01 DIAGNOSIS — B9689 Other specified bacterial agents as the cause of diseases classified elsewhere: Secondary | ICD-10-CM | POA: Diagnosis not present

## 2020-04-01 DIAGNOSIS — N760 Acute vaginitis: Secondary | ICD-10-CM

## 2020-04-01 DIAGNOSIS — Z124 Encounter for screening for malignant neoplasm of cervix: Secondary | ICD-10-CM | POA: Diagnosis present

## 2020-04-01 DIAGNOSIS — Z1151 Encounter for screening for human papillomavirus (HPV): Secondary | ICD-10-CM | POA: Diagnosis not present

## 2020-04-01 MED ORDER — HYLAFEM PH VA SUPP: 1.0000 | Freq: Every evening | VAGINAL | 3 refills | Status: AC

## 2020-04-01 MED ORDER — METRONIDAZOLE 500 MG PO TABS
500.0000 mg | ORAL_TABLET | Freq: Two times a day (BID) | ORAL | 0 refills | Status: DC
Start: 2020-04-01 — End: 2020-05-02

## 2020-04-01 NOTE — Progress Notes (Signed)
Obstetrics & Gynecology Office Visit   Chief Complaint:  Chief Complaint  Patient presents with  . VAGINAL STENOSIS    referral [facility]    History of Present Illness: [name] is a 49 y.o. [phone] who LMP was No LMP recorded. (Menstrual status: Perimenopausal)., presents today for a problem visit referred by Marnee Guarneri, NP at [facility] for recurrent bacterial vaginosis.   Patient complains of an abnormal vaginal discharge that has been treated multiple time over that past few years.  Discharge described as: thin and malodorous. Vaginal symptoms include none.   Other associated symptoms: none. She denies recent antibiotic exposure, denies changes in soaps, detergents coinciding with the onset of her symptoms.  She has previously self treated or been under treatment by another provider for these symptoms (reports use of flagyl, clindamycin, as well as vaginal preparations).    Her past medical history is notable for Lupus and she is on chronic prednisone.    Review of Systems:  Review of Systems  Constitutional: Negative.   Genitourinary: Negative.   Skin: Negative.      Past Medical History:  Past Medical History:  Diagnosis Date  . Arthritis    RA  . COPD (chronic obstructive pulmonary disease) ([place])   . Emphysema of lung ([place])   . Hypertension   . Lupus ([place])   . Thyroid disease     Past Surgical History:  Past Surgical History:  Procedure Laterality Date  . MOUTH SURGERY      Gynecologic History: No LMP recorded. (Menstrual status: Perimenopausal).  Obstetric History: [id number]  Family History:  Family History  Problem Relation Age of Onset  . Cancer Mother   . Lung cancer Mother   . Diabetes Mother   . Hypertension Father   . Cancer Maternal Aunt   . Ovarian cancer Maternal Aunt   . Diabetes Maternal Aunt   . Diabetes Maternal Uncle   . Hypertension Maternal Grandmother   . Diabetes Maternal Grandmother     Social History:    Social History   Socioeconomic History  . Marital status: Divorced    Spouse name: Not on file  . Number of children: Not on file  . Years of education: Not on file  . Highest education level: Not on file  Occupational History  . Not on file  Tobacco Use  . Smoking status: Current Every Day Smoker    Packs/day: 1.00    Years: 34.00    Pack years: 34.00    Types: Cigarettes  . Smokeless tobacco: Never Used  Vaping Use  . Vaping Use: Never used  Substance and Sexual Activity  . Alcohol use: Yes    Comment: Occ  . Drug use: Not Currently  . Sexual activity: Yes  Other Topics Concern  . Not on file  Social History Narrative  . Not on file   Social Determinants of Health   Financial Resource Strain:   . Difficulty of Paying Living Expenses:   Food Insecurity:   . Worried About Charity fundraiser in the Last Year:   . Arboriculturist in the Last Year:   Transportation Needs:   . Film/video editor (Medical):   Marland Kitchen Lack of Transportation (Non-Medical):   Physical Activity:   . Days of Exercise per Week:   . Minutes of Exercise per Session:   Stress:   . Feeling of Stress :   Social Connections:   . Frequency of  Communication with Friends and Family:   . Frequency of Social Gatherings with Friends and Family:   . Attends Religious Services:   . Active Member of Clubs or Organizations:   . Attends Archivist Meetings:   Marland Kitchen Marital Status:   Intimate Partner Violence:   . Fear of Current or Ex-Partner:   . Emotionally Abused:   Marland Kitchen Physically Abused:   . Sexually Abused:     Allergies:  Allergies  Allergen Reactions  . Azithromycin Anaphylaxis  . Penicillins   . Shellfish Allergy     Medications: Prior to Admission medications   Medication Sig Start Date End Date Taking? Authorizing Provider  busPIRone (BUSPAR) 10 MG tablet Take by mouth.   Yes [provider]  chlorpheniramine-HYDROcodone (TUSSIONEX PENNKINETIC ER) 10-8 MG/5ML SUER  Take 5 mLs by mouth every 12 (twelve) hours as needed. 03/28/20  Yes Norval Gable, MD  cyclobenzaprine (FLEXERIL) 10 MG tablet Take by mouth.   Yes [provider]  doxycycline (VIBRA-TABS) 100 MG tablet Take 1 tablet (100 mg total) by mouth 2 (two) times daily. 03/28/20  Yes Conty, Linward Foster, MD  fluticasone furoate-vilanterol (BREO ELLIPTA) 200-25 MCG/INH AEPB Inhale into the lungs.   Yes [provider]  gabapentin (NEURONTIN) 300 MG capsule Take 1 capsule (300 mg total) by mouth 3 (three) times daily. 12/16/19  Yes Cannady, Jolene T, NP  ibuprofen (ADVIL) 600 MG tablet Take by mouth.   Yes [provider]  ipratropium-albuterol (DUONEB) 0.5-2.5 (3) MG/3ML SOLN Take 3 mLs by nebulization every 6 (six) hours as needed. 12/02/19  Yes Karen Kitchens, NP  leflunomide (ARAVA) 20 MG tablet Take by mouth. 05/05/19 05/04/20 Yes [provider]  levothyroxine (SYNTHROID) 50 MCG tablet Take by mouth.   Yes [provider]  LORazepam (ATIVAN) 1 MG tablet Take 1 tablet (1 mg total) by mouth every 8 (eight) hours as needed for anxiety. 04/29/19 04/28/20 Yes Merlyn Lot, MD  losartan (COZAAR) 100 MG tablet Take by mouth.   Yes [provider]  predniSONE (DELTASONE) 20 MG tablet Take 20 mg by mouth daily with breakfast.   Yes [provider]  tiZANidine (ZANAFLEX) 4 MG tablet Take by mouth.   Yes [provider]  traZODone (DESYREL) 50 MG tablet Take 1 tablet (50 mg total) by mouth at bedtime. 01/05/20  Yes Cannady, Jolene T, NP  triamcinolone cream (KENALOG) 0.1 % Apply 1 application topically daily as needed. Apply to affected areas twice daily as needed. Avoid Face,Groin and underarms 03/23/20  Yes Brendolyn Patty, MD  umeclidinium-vilanterol [facility] ELLIPTA) 62.5-25 MCG/INH AEPB Inhale into the lungs. 02/12/19  Yes [provider]  albuterol (VENTOLIN HFA) 108 (90 Base) MCG/ACT inhaler Inhale into the lungs. 02/12/19 03/28/20  [provider]    Physical Exam Vitals:  Vitals:   04/01/20 0946  BP: (!) 157/103  Pulse: 96   No LMP recorded. (Menstrual status: Perimenopausal).  General: NAD HEENT: normocephalic, anicteric Pulmonary: No increased work of breathing Cardiovascular: RRR, distal pulses 2+ Genitourinary:  External: Normal external female genitalia.  Normal urethral meatus, normal Bartholin's and Skene's glands.    Vagina: Normal vaginal mucosa, no evidence of prolapse.  There is some dark discoloration on the anterior vaginal fornix approximately 1.5 x 1.5cm  Cervix: Grossly normal in appearance, no bleeding  Uterus: Non-enlarged, mobile, normal contour.  No CMT  Adnexa: ovaries non-enlarged, no adnexal masses  Rectal: deferred  Lymphatic: no evidence of inguinal lymphadenopathy Extremities: no edema, erythema,  or tenderness Neurologic: Grossly intact Psychiatric: mood appropriate, affect full  Female chaperone present for pelvic  portions of the physical exam  Assessment: 49 y.o. [phone] with recurrent bacterial vaginosis  Plan: Problem List Items Addressed This Visit    None    Visit Diagnoses    Screening for malignant neoplasm of cervix    -  Primary   Relevant Orders   Cytology - PAP   Bacterial vaginosis       Relevant Medications   metroNIDAZOLE (FLAGYL) 500 MG tablet   Other Relevant Orders   NuSwab Vaginitis Plus (VG+)   Recurrent vaginitis       Relevant Orders   NuSwab Vaginitis Plus (VG+)   Routine screening for STI (sexually transmitted infection)       Relevant Orders   NuSwab Vaginitis Plus (VG+)      1) Risk factors for bacterial vaginosis and candida infections discussed.  We discussed normal vaginal flora/microbiome.  Any factors that may alter the microbiome increase the risk of these opportunistic infections.  These include changes in pH, antibiotic exposures, diabetes, wet bathing suits etc.  We discussed that treatment is aimed at eradicating abnormal  bacterial overgrowth and or yeast.  There may be some role for vaginal probiotics in restoring normal vaginal flora.   - risk factors for patient include prednisone use/immunosuppression with Lupus - Rx flagyl and hylafem (3 samples given) - pap brought up to date - nuwab collected  - Dark discoloration anterior fornix noted and has been present for sometime per patient.  2) A total of 15 minutes were spent in face-to-face contact with the patient during this encounter with over half of that time devoted to counseling and coordination of care.  3) A copy of this note will be sent to the patient's referring provider  4) Return in about 1 year (around 04/01/2021), or if symptoms worsen or fail to improve, for annual.   Malachy Mood, MD, [place], [place] 04/01/2020, 10:08 AM

## 2020-04-01 NOTE — Patient Instructions (Signed)
Bacterial Vaginosis  Bacterial vaginosis is a vaginal infection that occurs when the normal balance of bacteria in the vagina is disrupted. It results from an overgrowth of certain bacteria. This is the most common vaginal infection among women ages 15-44. Because bacterial vaginosis increases your risk for STIs (sexually transmitted infections), getting treated can help reduce your risk for chlamydia, gonorrhea, herpes, and HIV (human immunodeficiency virus). Treatment is also important for preventing complications in pregnant women, because this condition can cause an early (premature) delivery. What are the causes? This condition is caused by an increase in harmful bacteria that are normally present in small amounts in the vagina. However, the reason that the condition develops is not fully understood. What increases the risk? The following factors may make you more likely to develop this condition:  Having a new sexual partner or multiple sexual partners.  Having unprotected sex.  Douching.  Having an intrauterine device (IUD).  Smoking.  Drug and alcohol abuse.  Taking certain antibiotic medicines.  Being pregnant. You cannot get bacterial vaginosis from toilet seats, bedding, swimming pools, or contact with objects around you. What are the signs or symptoms? Symptoms of this condition include:  Grey or white vaginal discharge. The discharge can also be watery or foamy.  A fish-like odor with discharge, especially after sexual intercourse or during menstruation.  Itching in and around the vagina.  Burning or pain with urination. Some women with bacterial vaginosis have no signs or symptoms. How is this diagnosed? This condition is diagnosed based on:  Your medical history.  A physical exam of the vagina.  Testing a sample of vaginal fluid under a microscope to look for a large amount of bad bacteria or abnormal cells. Your health care provider may use a cotton swab or  a small wooden spatula to collect the sample. How is this treated? This condition is treated with antibiotics. These may be given as a pill, a vaginal cream, or a medicine that is put into the vagina (suppository). If the condition comes back after treatment, a second round of antibiotics may be needed. Follow these instructions at home: Medicines  Take over-the-counter and prescription medicines only as told by your health care provider.  Take or use your antibiotic as told by your health care provider. Do not stop taking or using the antibiotic even if you start to feel better. General instructions  If you have a female sexual partner, tell her that you have a vaginal infection. She should see her health care provider and be treated if she has symptoms. If you have a female sexual partner, he does not need treatment.  During treatment: ? Avoid sexual activity until you finish treatment. ? Do not douche. ? Avoid alcohol as directed by your health care provider. ? Avoid breastfeeding as directed by your health care provider.  Drink enough water and fluids to keep your urine clear or pale yellow.  Keep the area around your vagina and rectum clean. ? Wash the area daily with warm water. ? Wipe yourself from front to back after using the toilet.  Keep all follow-up visits as told by your health care provider. This is important. How is this prevented?  Do not douche.  Wash the outside of your vagina with warm water only.  Use protection when having sex. This includes latex condoms and dental dams.  Limit how many sexual partners you have. To help prevent bacterial vaginosis, it is best to have sex with just one partner (  monogamous).  Make sure you and your sexual partner are tested for STIs.  Wear cotton or cotton-lined underwear.  Avoid wearing tight pants and pantyhose, especially during summer.  Limit the amount of alcohol that you drink.  Do not use any products that contain  nicotine or tobacco, such as cigarettes and e-cigarettes. If you need help quitting, ask your health care provider.  Do not use illegal drugs. Where to find more information  Centers for Disease Control and Prevention: www.cdc.gov/std  American Sexual Health Association (ASHA): www.ashastd.org  U.S. Department of Health and Human Services, Office on Women's Health: www.womenshealth.gov/ or https://www.womenshealth.gov/a-z-topics/bacterial-vaginosis Contact a health care provider if:  Your symptoms do not improve, even after treatment.  You have more discharge or pain when urinating.  You have a fever.  You have pain in your abdomen.  You have pain during sex.  You have vaginal bleeding between periods. Summary  Bacterial vaginosis is a vaginal infection that occurs when the normal balance of bacteria in the vagina is disrupted.  Because bacterial vaginosis increases your risk for STIs (sexually transmitted infections), getting treated can help reduce your risk for chlamydia, gonorrhea, herpes, and HIV (human immunodeficiency virus). Treatment is also important for preventing complications in pregnant women, because the condition can cause an early (premature) delivery.  This condition is treated with antibiotic medicines. These may be given as a pill, a vaginal cream, or a medicine that is put into the vagina (suppository). This information is not intended to replace advice given to you by your health care provider. Make sure you discuss any questions you have with your health care provider. Document Revised: 09/21/2017 Document Reviewed: 06/24/2016 Elsevier Patient Education  2020 [employer]  

## 2020-04-05 ENCOUNTER — Other Ambulatory Visit: Payer: Self-pay

## 2020-04-05 ENCOUNTER — Encounter: Payer: Self-pay | Admitting: Emergency Medicine

## 2020-04-05 ENCOUNTER — Ambulatory Visit (INDEPENDENT_AMBULATORY_CARE_PROVIDER_SITE_OTHER)
Admission: EM | Admit: 2020-04-05 | Discharge: 2020-04-05 | Disposition: A | Discharge status: Home / Self Care | Payer: Medicare Other | Source: Home / Self Care | Attending: Family Medicine | Admitting: Family Medicine

## 2020-04-05 ENCOUNTER — Emergency Department
Admission: EM | Admit: 2020-04-05 | Discharge: 2020-04-05 | Disposition: A | Discharge status: Home / Self Care | Payer: Medicare Other | Attending: Student | Admitting: Student

## 2020-04-05 ENCOUNTER — Emergency Department: Payer: Medicare Other

## 2020-04-05 DIAGNOSIS — M0589 Other rheumatoid arthritis with rheumatoid factor of multiple sites: Secondary | ICD-10-CM | POA: Insufficient documentation

## 2020-04-05 DIAGNOSIS — J441 Chronic obstructive pulmonary disease with (acute) exacerbation: Secondary | ICD-10-CM | POA: Insufficient documentation

## 2020-04-05 DIAGNOSIS — R079 Chest pain, unspecified: Secondary | ICD-10-CM

## 2020-04-05 DIAGNOSIS — Z79899 Other long term (current) drug therapy: Secondary | ICD-10-CM | POA: Diagnosis not present

## 2020-04-05 DIAGNOSIS — F17210 Nicotine dependence, cigarettes, uncomplicated: Secondary | ICD-10-CM | POA: Diagnosis not present

## 2020-04-05 DIAGNOSIS — I10 Essential (primary) hypertension: Secondary | ICD-10-CM | POA: Insufficient documentation

## 2020-04-05 DIAGNOSIS — R059 Cough, unspecified: Secondary | ICD-10-CM

## 2020-04-05 DIAGNOSIS — E039 Hypothyroidism, unspecified: Secondary | ICD-10-CM | POA: Diagnosis not present

## 2020-04-05 LAB — NUSWAB VAGINITIS PLUS (VG+)
Candida albicans, NAA: NEGATIVE
Candida glabrata, NAA: NEGATIVE
Chlamydia trachomatis, NAA: NEGATIVE
Neisseria gonorrhoeae, NAA: NEGATIVE
Trich vag by NAA: NEGATIVE

## 2020-04-05 LAB — BASIC METABOLIC PANEL
Anion gap: 9 (ref 5–15)
BUN: 19 mg/dL (ref 6–20)
CO2: 24 mmol/L (ref 22–32)
Calcium: 8.9 mg/dL (ref 8.9–10.3)
Chloride: 107 mmol/L (ref 98–111)
Creatinine, Ser: 0.67 mg/dL (ref 0.44–1.00)
GFR calc Af Amer: >60 mL/min (ref >60)
GFR calc non Af Amer: >60 mL/min (ref >60)
Glucose, Bld: 101 mg/dL — ABNORMAL HIGH (ref 70–99)
Potassium: 3.5 mmol/L (ref 3.5–5.1)
Sodium: 140 mmol/L (ref 135–145)

## 2020-04-05 LAB — CYTOLOGY - PAP
Adequacy: ABSENT
Comment: NEGATIVE
Diagnosis: NEGATIVE
High risk HPV: NEGATIVE

## 2020-04-05 LAB — CBC
HCT: 43.1 % (ref 36.0–46.0)
Hemoglobin: 14.2 g/dL (ref 12.0–15.0)
MCH: 30.8 pg (ref 26.0–34.0)
MCHC: 32.9 g/dL (ref 30.0–36.0)
MCV: 93.5 fL (ref 80.0–100.0)
Platelets: 360 10*3/uL (ref 150–400)
RBC: 4.61 MIL/uL (ref 3.87–5.11)
RDW: 14.1 % (ref 11.5–15.5)
WBC: 9 10*3/uL (ref 4.0–10.5)
nRBC: 0 % (ref 0.0–0.2)

## 2020-04-05 LAB — TROPONIN I (HIGH SENSITIVITY)
Troponin I (High Sensitivity): 5 ng/L (ref <18)
Troponin I (High Sensitivity): 4 ng/L (ref <18)

## 2020-04-05 MED ORDER — PREDNISONE 20 MG PO TABS
40.0000 mg | ORAL_TABLET | Freq: Once | ORAL | Status: AC
  Administered 2020-04-05: 40 mg via ORAL
  Filled 2020-04-05: qty 2

## 2020-04-05 MED ORDER — NITROGLYCERIN 2 % TD OINT
0.5000 [in_us] | TOPICAL_OINTMENT | Freq: Once | TRANSDERMAL | Status: AC
  Administered 2020-04-05: 0.5 [in_us] via TOPICAL

## 2020-04-05 MED ORDER — IPRATROPIUM-ALBUTEROL 0.5-2.5 (3) MG/3ML IN SOLN
3.0000 mL | Freq: Once | RESPIRATORY_TRACT | Status: AC
  Administered 2020-04-05: 3 mL via RESPIRATORY_TRACT
  Filled 2020-04-05: qty 3

## 2020-04-05 MED ORDER — SODIUM CHLORIDE 0.9% FLUSH: 3.0000 mL | Freq: Once | INTRAVENOUS | Status: DC

## 2020-04-05 MED ORDER — DOXYCYCLINE HYCLATE 100 MG PO CAPS
100.0000 mg | ORAL_CAPSULE | Freq: Two times a day (BID) | ORAL | 0 refills | Status: AC
Start: 2020-04-05 — End: 2020-04-12

## 2020-04-05 MED ORDER — PREDNISONE 20 MG PO TABS: 40.0000 mg | ORAL_TABLET | Freq: Every day | ORAL | 0 refills | Status: AC

## 2020-04-05 MED ORDER — ASPIRIN 81 MG PO CHEW
324.0000 mg | CHEWABLE_TABLET | Freq: Once | ORAL | Status: AC
  Administered 2020-04-05: 324 mg via ORAL

## 2020-04-05 NOTE — ED Provider Notes (Signed)
Patient states  [facility] Emergency Department Provider Note  ____________________________________________   First MD Initiated Contact with Patient 04/05/20 1938     (approximate)  I have reviewed the triage vital signs and the nursing notes.  History  Chief Complaint Chest Pain    HPI Catherine Giles is a 49 y.o. female past medical history as below, who presents to the emergency department for chest pain and cough.  She received her first COVID vaccine on Thursday.  She is not sure if her symptoms are related to this, but starting on Thursday evening she developed some left-sided body pain.  No weakness, numbness, tingling.  On Friday she developed central and left-sided chest pain.  Pain is sharp, steady, moderate in severity.  No radiation.  No alleviating or aggravating components.  Also reports an increased cough above her baseline COPD.  Not particularly productive, but more than her normal.  No nausea or vomiting.  No history of CAD.  Initially seen in urgent care where she was noted to have T wave inversions on her EKG and therefore referred to the ER for further cardiac evaluation.  Given ASA and nitroglycerin.   Past Medical Hx Past Medical History:  Diagnosis Date  . Arthritis    RA  . COPD (chronic obstructive pulmonary disease) ([place])   . Emphysema of lung ([place])   . Hypertension   . Lupus ([place])   . Thyroid disease     Problem List Patient Active Problem List   Diagnosis Date Noted  . Vaginal discharge 03/19/2020  . Vaginal stenosis 03/19/2020  . Obesity 03/19/2020  . Insomnia 01/05/2020  . Complex regional pain syndrome I of upper limb 12/17/2019  . Pain of left hand 12/16/2019  . Essential hypertension 12/13/2019  . Centrilobular emphysema ([place]) 12/13/2019  . Elevated hemoglobin A1c 12/13/2019  . Hypothyroid 12/13/2019  . DDD (degenerative disc disease), lumbar 08/26/2019  . Multinodular goiter 08/14/2019  . Chronic left hip pain  05/05/2019  . Osteoarthritis of spine with radiculopathy, cervical region 05/05/2019  . Encounter for long-term (current) use of high-risk medication 02/14/2019  . Fibromyalgia 02/10/2019  . Rheumatoid arthritis involving multiple sites with positive rheumatoid factor ([place]) 02/10/2019  . Undifferentiated connective tissue disease ([place]) 02/10/2019  . Cervical radiculopathy 03/13/2018  . Vitamin D deficiency 10/11/2015    Past Surgical Hx Past Surgical History:  Procedure Laterality Date  . MOUTH SURGERY      Medications Prior to Admission medications   Medication Sig Start Date End Date Taking? Authorizing Provider  albuterol (VENTOLIN HFA) 108 (90 Base) MCG/ACT inhaler Inhale into the lungs. 02/12/19 04/05/20  [provider]  busPIRone (BUSPAR) 10 MG tablet Take by mouth.    [provider]  chlorpheniramine-HYDROcodone (TUSSIONEX PENNKINETIC ER) 10-8 MG/5ML SUER Take 5 mLs by mouth every 12 (twelve) hours as needed. 03/28/20   Norval Gable, MD  cyclobenzaprine (FLEXERIL) 10 MG tablet Take by mouth.    [provider]  doxycycline (VIBRA-TABS) 100 MG tablet Take 1 tablet (100 mg total) by mouth 2 (two) times daily. 03/28/20   Norval Gable, MD  fluticasone furoate-vilanterol (BREO ELLIPTA) 200-25 MCG/INH AEPB Inhale into the lungs.    [provider]  gabapentin (NEURONTIN) 300 MG capsule Take 1 capsule (300 mg total) by mouth 3 (three) times daily. 12/16/19   Cannady, Henrine Screws T, NP  ibuprofen (ADVIL) 600 MG tablet Take by mouth.    [provider]  ipratropium-albuterol (DUONEB) 0.5-2.5 (3) MG/3ML SOLN Take 3  mLs by nebulization every 6 (six) hours as needed. 12/02/19   Karen Kitchens, NP  leflunomide (ARAVA) 20 MG tablet Take by mouth. 05/05/19 05/04/20  [provider]  levothyroxine (SYNTHROID) 50 MCG tablet Take by mouth.    [provider]  LORazepam (ATIVAN) 1 MG tablet Take 1 tablet (1 mg total) by mouth every 8 (eight) hours  as needed for anxiety. 04/29/19 04/28/20  Merlyn Lot, MD  losartan (COZAAR) 100 MG tablet Take by mouth.    [provider]  metroNIDAZOLE (FLAGYL) 500 MG tablet Take 1 tablet (500 mg total) by mouth 2 (two) times daily. 04/01/20   Malachy Mood, MD  predniSONE (DELTASONE) 20 MG tablet Take 20 mg by mouth daily with breakfast.    [provider]  tiZANidine (ZANAFLEX) 4 MG tablet Take by mouth.    [provider]  traZODone (DESYREL) 50 MG tablet Take 1 tablet (50 mg total) by mouth at bedtime. 01/05/20   Cannady, Henrine Screws T, NP  triamcinolone cream (KENALOG) 0.1 % Apply 1 application topically daily as needed. Apply to affected areas twice daily as needed. Avoid Face,Groin and underarms 03/23/20   Brendolyn Patty, MD  umeclidinium-vilanterol [facility] ELLIPTA) 62.5-25 MCG/INH AEPB Inhale into the lungs. 02/12/19   [provider]    Allergies Azithromycin, Penicillins, and Shellfish allergy  Family Hx Family History  Problem Relation Age of Onset  . Cancer Mother   . Lung cancer Mother   . Diabetes Mother   . Hypertension Father   . Cancer Maternal Aunt   . Ovarian cancer Maternal Aunt   . Diabetes Maternal Aunt   . Diabetes Maternal Uncle   . Hypertension Maternal Grandmother   . Diabetes Maternal Grandmother     Social Hx Social History   Tobacco Use  . Smoking status: Current Every Day Smoker    Packs/day: 1.00    Years: 34.00    Pack years: 34.00    Types: Cigarettes  . Smokeless tobacco: Never Used  Vaping Use  . Vaping Use: Never used  Substance Use Topics  . Alcohol use: Yes    Comment: Occ  . Drug use: Not Currently     Review of Systems  Constitutional: Negative for fever. Negative for chills. Eyes: Negative for visual changes. ENT: Negative for sore throat. Cardiovascular: + for chest pain. Respiratory: + cough Gastrointestinal: Negative for nausea. Negative for vomiting.  Genitourinary: Negative for  dysuria. Musculoskeletal: Negative for leg swelling. Skin: Negative for rash. Neurological: Negative for headaches.   Physical Exam  Vital Signs: ED Triage Vitals  Enc Vitals Group     BP 04/05/20 1647 133/73     Pulse Rate 04/05/20 1647 95     Resp 04/05/20 1647 18     Temp 04/05/20 1647 98.9 F (37.2 C)     Temp Source 04/05/20 1647 Oral     SpO2 04/05/20 1647 97 %     Weight 04/05/20 1647 170 lb (77.1 kg)     Height 04/05/20 1647 5\' 3"  (1.6 m)     Head Circumference --      Peak Flow --      Pain Score 04/05/20 1652 10     Pain Loc --      Pain Edu? --      Excl. in [place]? --     Constitutional: Alert and oriented. Well appearing. NAD.  Head: Normocephalic. Atraumatic. Eyes: Conjunctivae clear. Sclera anicteric. Pupils equal and symmetric. Nose: No masses or lesions.  No congestion or rhinorrhea. Mouth/Throat: Wearing mask.  Neck: No stridor. Trachea midline.  Cardiovascular: Normal rate, regular rhythm. Extremities well perfused. Respiratory: Normal respiratory effort.  Slightly prolonged expiratory phase with faint scattered expiratory wheezing. Gastrointestinal: Soft. Non-distended. Non-tender.  Genitourinary: Deferred. Musculoskeletal: No lower extremity edema. No deformities. Neurologic:  Normal speech and language. No gross focal or lateralizing neurologic deficits are appreciated.  Skin: Skin is warm, dry and intact. No rash noted. Psychiatric: Mood and affect are appropriate for situation.  EKG  Personally reviewed and interpreted by myself.   Date: 04/05/20 Time: 1528 - urgent care EKG Rate: 84 Rhythm: sinus Axis: normal Intervals: WNL TWI inferior, Q waves V1, V2 No STEMI  2026 Rate: 89 Rhythm: Sinus Axis: Normal Intervals: QTc 503 ms Nonspecific T wave abnormalities T wave inversions less prominent No STEMI    Radiology  Personally reviewed available imaging myself.   CXR -  IMPRESSION:  Mild lingular subsegmental atelectasis.     Procedures  Procedure(s) performed (including critical care):  Procedures   Initial Impression / Assessment and Plan / MDM / ED Course  49 y.o. female who presents to the ED for chest pain, cough.  Symptoms started after her first COVID vaccine, unsure if it is related.  Ddx: ACS, pleurisy, bronchitis, COPD exacerbation.  Took full dose ASA prior to arrival.  Exam seems most consistent with a mild COPD exacerbation with slightly prolonged expiratory phase and faint expiratory wheezing.  No respiratory distress or accessory muscle use.  No hypoxia.  Neurological exam is nonfocal.  Will plan for labs, imaging, nebulizer treatment and reassess  CXR with some lingular subsegmental atelectasis, no other focal consolidations.  Troponin and delta troponin negative.  EKG as above.  Given negative work-up, feel patient is stable for discharge.  We will plan to treat as COPD exacerbation with short course of steroids and doxycycline (allergy to azithromycin).  Will refer for outpatient cardiology follow-up.  Patient agreeable with the plan.  Given return precautions.   _______________________________   As part of my medical decision making I have reviewed available labs, radiology tests, reviewed old records/performed chart review.    Final Clinical Impression(s) / ED Diagnosis  Chest pain in adult Cough COPD exacerbation   Note:  This document was prepared using Dragon voice recognition software and may include unintentional dictation errors.   Lilia Pro., MD 04/05/20 2128

## 2020-04-05 NOTE — ED Triage Notes (Signed)
Patient c/o chest pain that started Thursday. She states the pain is constant and she is having SOB. She does report that she had the [place] vaccine on Thursday.

## 2020-04-05 NOTE — ED Notes (Signed)
BIB EMS from [facility] for CP since Friday, sharp and to left side. Given 325 ASA with EMS, 1 inch nitro paste  And 1 nitro spray with EMS. 20G to L AC started by EMS. EKG obtained on arrival.

## 2020-04-05 NOTE — ED Notes (Signed)
E-signature not working at this time. Pt verbalized understanding of D/C instructions, prescriptions and follow up care with no further questions at this time. Pt in NAD and ambulatory at time of D/C.  

## 2020-04-05 NOTE — ED Provider Notes (Signed)
[facility]    CSN: [id number] Arrival date & time: 04/05/20  1507      History   Chief Complaint Chief Complaint  Patient presents with   Chest Pain   Shortness of Breath    HPI Catherine Giles is a 49 y.o. female.   49 yo female with multiple chronic medical problems including hypertension, COPD, rheumatoid arthritis presents with a c/o left sided chest pains for the past 4 days but worse today. States pain is now radiating to her left arm and associated with shortness of breath. Denies any jaw pain, fevers, chills, cough.   Patient states chest pain started 1 day after receiving her 1st covid vaccine shot last Thursday.    Chest Pain Associated symptoms: shortness of breath   Shortness of Breath Associated symptoms: chest pain     Past Medical History:  Diagnosis Date   Arthritis    RA   COPD (chronic obstructive pulmonary disease) (H[facility])    Emphysema of lung ([place])    Hypertension    Lupus ([place])    Thyroid disease     Patient Active Problem List   Diagnosis Date Noted   Vaginal discharge 03/19/2020   Vaginal stenosis 03/19/2020   Obesity 03/19/2020   Insomnia 01/05/2020   Complex regional pain syndrome I of upper limb 12/17/2019   Pain of left hand 12/16/2019   Essential hypertension 12/13/2019   Centrilobular emphysema ([place]) 12/13/2019   Elevated hemoglobin A1c 12/13/2019   Hypothyroid 12/13/2019   DDD (degenerative disc disease), lumbar 08/26/2019   Multinodular goiter 08/14/2019   Chronic left hip pain 05/05/2019   Osteoarthritis of spine with radiculopathy, cervical region 05/05/2019   Encounter for long-term (current) use of high-risk medication 02/14/2019   Fibromyalgia 02/10/2019   Rheumatoid arthritis involving multiple sites with positive rheumatoid factor ([place]) 02/10/2019   Undifferentiated connective tissue disease ([place]) 02/10/2019   Cervical radiculopathy 03/13/2018   Vitamin D deficiency 10/11/2015     Past Surgical History:  Procedure Laterality Date   MOUTH SURGERY      OB History    Gravida  9   Para  6   Term      Preterm      AB  3   Living        SAB  2   TAB      Ectopic      Multiple      Live Births               Home Medications    Prior to Admission medications   Medication Sig Start Date End Date Taking? Authorizing Provider  albuterol (VENTOLIN HFA) 108 (90 Base) MCG/ACT inhaler Inhale into the lungs. 02/12/19 04/05/20 Yes [provider]  busPIRone (BUSPAR) 10 MG tablet Take by mouth.   Yes [provider]  cyclobenzaprine (FLEXERIL) 10 MG tablet Take by mouth.   Yes [provider]  fluticasone furoate-vilanterol (BREO ELLIPTA) 200-25 MCG/INH AEPB Inhale into the lungs.   Yes [provider]  gabapentin (NEURONTIN) 300 MG capsule Take 1 capsule (300 mg total) by mouth 3 (three) times daily. 12/16/19  Yes Cannady, Jolene T, NP  ibuprofen (ADVIL) 600 MG tablet Take by mouth.   Yes [provider]  ipratropium-albuterol (DUONEB) 0.5-2.5 (3) MG/3ML SOLN Take 3 mLs by nebulization every 6 (six) hours as needed. 12/02/19  Yes Karen Kitchens, NP  levothyroxine (SYNTHROID) 50 MCG tablet Take by mouth.   Yes [provider]  LORazepam (ATIVAN) 1 MG tablet Take 1 tablet (1 mg total) by mouth every 8 (eight) hours as needed for anxiety. 04/29/19 04/28/20 Yes Merlyn Lot, MD  losartan (COZAAR) 100 MG tablet Take by mouth.   Yes [provider]  predniSONE (DELTASONE) 20 MG tablet Take 20 mg by mouth daily with breakfast.   Yes [provider]  tiZANidine (ZANAFLEX) 4 MG tablet Take by mouth.   Yes [provider]  traZODone (DESYREL) 50 MG tablet Take 1 tablet (50 mg total) by mouth at bedtime. 01/05/20  Yes Cannady, Jolene T, NP  triamcinolone cream (KENALOG) 0.1 % Apply 1 application topically daily as needed. Apply to affected areas twice daily as needed. Avoid Face,Groin and  underarms 03/23/20  Yes Brendolyn Patty, MD  umeclidinium-vilanterol [facility] ELLIPTA) 62.5-25 MCG/INH AEPB Inhale into the lungs. 02/12/19  Yes [provider]  chlorpheniramine-HYDROcodone (TUSSIONEX PENNKINETIC ER) 10-8 MG/5ML SUER Take 5 mLs by mouth every 12 (twelve) hours as needed. 03/28/20   Norval Gable, MD  doxycycline (VIBRA-TABS) 100 MG tablet Take 1 tablet (100 mg total) by mouth 2 (two) times daily. 03/28/20   Norval Gable, MD  leflunomide (ARAVA) 20 MG tablet Take by mouth. 05/05/19 05/04/20  [provider]  metroNIDAZOLE (FLAGYL) 500 MG tablet Take 1 tablet (500 mg total) by mouth 2 (two) times daily. 04/01/20   Malachy Mood, MD    Family History Family History  Problem Relation Age of Onset   Cancer Mother    Lung cancer Mother    Diabetes Mother    Hypertension Father    Cancer Maternal Aunt    Ovarian cancer Maternal Aunt    Diabetes Maternal Aunt    Diabetes Maternal Uncle    Hypertension Maternal Grandmother    Diabetes Maternal Grandmother     Social History Social History   Tobacco Use   Smoking status: Current Every Day Smoker    Packs/day: 1.00    Years: 34.00    Pack years: 34.00    Types: Cigarettes   Smokeless tobacco: Never Used  Scientific laboratory technician Use: Never used  Substance Use Topics   Alcohol use: Yes    Comment: Occ   Drug use: Not Currently     Allergies   Azithromycin, Penicillins, and Shellfish allergy   Review of Systems Review of Systems  Respiratory: Positive for shortness of breath.   Cardiovascular: Positive for chest pain.     Physical Exam Triage Vital Signs ED Triage Vitals  Enc Vitals Group     BP 04/05/20 1522 136/83     Pulse Rate 04/05/20 1522 90     Resp 04/05/20 1522 18     Temp 04/05/20 1522 98.2 F (36.8 C)     Temp Source 04/05/20 1522 Oral     SpO2 04/05/20 1522 97 %     Weight 04/05/20 1520 170 lb (77.1 kg)     Height 04/05/20 1520 5\' 3"  (1.6 m)     Head Circumference  --      Peak Flow --      Pain Score 04/05/20 1520 8     Pain Loc --      Pain Edu? --      Excl. in [place]? --    No data found.  Updated Vital Signs BP 136/83 (BP Location: Right Arm)    Pulse 90    Temp 98.2 F (36.8 C) (Oral)    Resp 18    Ht 5\' 3"  (  1.6 m)    Wt 77.1 kg    SpO2 97%    BMI 30.11 kg/m   Visual Acuity Right Eye Distance:   Left Eye Distance:   Bilateral Distance:    Right Eye Near:   Left Eye Near:    Bilateral Near:     Physical Exam Vitals and nursing note reviewed.  Constitutional:      General: She is not in acute distress.    Appearance: She is not toxic-appearing or diaphoretic.  Cardiovascular:     Rate and Rhythm: Normal rate and regular rhythm.     Heart sounds: Normal heart sounds.  Pulmonary:     Effort: Pulmonary effort is normal.     Breath sounds: Normal breath sounds.  Neurological:     Mental Status: She is alert.      UC Treatments / Results  Labs (all labs ordered are listed, but only abnormal results are displayed) Labs Reviewed - No data to display  EKG   Radiology No results found.  Procedures ED EKG  Date/Time: 04/05/2020 4:07 PM Performed by: Norval Gable, MD Authorized by: Norval Gable, MD   ECG reviewed by ED Physician in the absence of a cardiologist: yes   Previous ECG:    Previous ECG:  Compared to current   Similarity:  Changes noted Interpretation:    Interpretation: abnormal   Rate:    ECG rate:  84   ECG rate assessment: normal   Rhythm:    Rhythm: sinus rhythm   Ectopy:    Ectopy: none   QRS:    QRS axis:  Normal   QRS intervals:  Normal Conduction:    Conduction: normal   ST segments:    ST segments:  Normal T waves:    T waves: inverted   Q waves:    Q waves:  V4 and V5   (including critical care time)  Medications Ordered in UC Medications  aspirin chewable tablet 324 mg (324 mg Oral Given 04/05/20 1606)  nitroGLYCERIN (NITROGLYN) 2 % ointment 0.5 inch (0.5 inches Topical Given  04/05/20 1606)    Initial Impression / Assessment and Plan / UC Course  I have reviewed the triage vital signs and the nursing notes.  Pertinent labs & imaging results that were available during my care of the patient were reviewed by me and considered in my medical decision making (see chart for details).      Final Clinical Impressions(s) / UC Diagnoses   Final diagnoses:  Chest pain, unspecified type     1. diagnosis reviewed with patient; recommend patient go to Emergency Department for further evaluation and management due to multiple cardiac risk factors/chronic medical problems and symptom presentation. Patient given ASA 325mg  po and 1/2 in. Nitroglycerin paste; iv lock. Patient in stable condition transported by EMS. Report called to charge RN at [facility] ED.     ED Prescriptions    None     PDMP not reviewed this encounter.   Norval Gable, MD 04/05/20 [id number]

## 2020-04-05 NOTE — ED Triage Notes (Signed)
Pt comes via [facility] with c/o chest pain that started following her 1st  COVID shot on Friday. Pt states since it has gotten worse.

## 2020-04-05 NOTE — Discharge Instructions (Addendum)
Thank you for letting us take care of you in the emergency department today.   Please continue to take any regular, prescribed medications.   New medications we have prescribed:  Doxycycline, antibiotic Prednisone, steroid  These medications will help treat COPD exacerbation  Please follow up with: Your primary care doctor to review your ER visit and follow up on your symptoms.  Cardiology, referral and information is below   Please return to the ER for any new or worsening symptoms.

## 2020-04-14 ENCOUNTER — Encounter: Payer: Self-pay | Admitting: Obstetrics and Gynecology

## 2020-04-14 NOTE — Telephone Encounter (Signed)
This encounter was created in error - please disregard.

## 2020-04-22 HISTORY — PX: TRANSTHORACIC ECHOCARDIOGRAM: SHX275

## 2020-04-29 ENCOUNTER — Inpatient Hospital Stay
Admission: EM | Admit: 2020-04-29 | Discharge: 2020-05-02 | DRG: 193 | Disposition: A | Discharge status: Home / Self Care | Payer: Medicare Other | Attending: Internal Medicine | Admitting: Internal Medicine

## 2020-04-29 ENCOUNTER — Inpatient Hospital Stay: Payer: Medicare Other

## 2020-04-29 ENCOUNTER — Emergency Department: Payer: Medicare Other

## 2020-04-29 ENCOUNTER — Other Ambulatory Visit: Payer: Self-pay

## 2020-04-29 DIAGNOSIS — E063 Autoimmune thyroiditis: Secondary | ICD-10-CM | POA: Diagnosis present

## 2020-04-29 DIAGNOSIS — E039 Hypothyroidism, unspecified: Secondary | ICD-10-CM | POA: Diagnosis present

## 2020-04-29 DIAGNOSIS — J9601 Acute respiratory failure with hypoxia: Secondary | ICD-10-CM

## 2020-04-29 DIAGNOSIS — J209 Acute bronchitis, unspecified: Secondary | ICD-10-CM | POA: Diagnosis present

## 2020-04-29 DIAGNOSIS — I152 Hypertension secondary to endocrine disorders: Secondary | ICD-10-CM | POA: Diagnosis present

## 2020-04-29 DIAGNOSIS — I10 Essential (primary) hypertension: Secondary | ICD-10-CM | POA: Diagnosis present

## 2020-04-29 DIAGNOSIS — Z880 Allergy status to penicillin: Secondary | ICD-10-CM | POA: Diagnosis not present

## 2020-04-29 DIAGNOSIS — F419 Anxiety disorder, unspecified: Secondary | ICD-10-CM | POA: Diagnosis present

## 2020-04-29 DIAGNOSIS — E041 Nontoxic single thyroid nodule: Secondary | ICD-10-CM | POA: Diagnosis present

## 2020-04-29 DIAGNOSIS — E876 Hypokalemia: Secondary | ICD-10-CM | POA: Diagnosis not present

## 2020-04-29 DIAGNOSIS — Z6830 Body mass index (BMI) 30.0-30.9, adult: Secondary | ICD-10-CM | POA: Diagnosis not present

## 2020-04-29 DIAGNOSIS — J441 Chronic obstructive pulmonary disease with (acute) exacerbation: Secondary | ICD-10-CM | POA: Diagnosis present

## 2020-04-29 DIAGNOSIS — R222 Localized swelling, mass and lump, trunk: Secondary | ICD-10-CM | POA: Diagnosis present

## 2020-04-29 DIAGNOSIS — Z8249 Family history of ischemic heart disease and other diseases of the circulatory system: Secondary | ICD-10-CM

## 2020-04-29 DIAGNOSIS — M0579 Rheumatoid arthritis with rheumatoid factor of multiple sites without organ or systems involvement: Secondary | ICD-10-CM | POA: Diagnosis present

## 2020-04-29 DIAGNOSIS — J189 Pneumonia, unspecified organism: Secondary | ICD-10-CM | POA: Diagnosis present

## 2020-04-29 DIAGNOSIS — Z20822 Contact with and (suspected) exposure to covid-19: Secondary | ICD-10-CM | POA: Diagnosis present

## 2020-04-29 DIAGNOSIS — Z79899 Other long term (current) drug therapy: Secondary | ICD-10-CM | POA: Diagnosis not present

## 2020-04-29 DIAGNOSIS — J440 Chronic obstructive pulmonary disease with acute lower respiratory infection: Secondary | ICD-10-CM | POA: Diagnosis present

## 2020-04-29 DIAGNOSIS — F17210 Nicotine dependence, cigarettes, uncomplicated: Secondary | ICD-10-CM | POA: Diagnosis present

## 2020-04-29 DIAGNOSIS — J9859 Other diseases of mediastinum, not elsewhere classified: Secondary | ICD-10-CM | POA: Diagnosis not present

## 2020-04-29 DIAGNOSIS — M329 Systemic lupus erythematosus, unspecified: Secondary | ICD-10-CM | POA: Diagnosis present

## 2020-04-29 DIAGNOSIS — E1159 Type 2 diabetes mellitus with other circulatory complications: Secondary | ICD-10-CM | POA: Diagnosis present

## 2020-04-29 DIAGNOSIS — R1310 Dysphagia, unspecified: Secondary | ICD-10-CM | POA: Diagnosis present

## 2020-04-29 DIAGNOSIS — Z888 Allergy status to other drugs, medicaments and biological substances status: Secondary | ICD-10-CM | POA: Diagnosis not present

## 2020-04-29 DIAGNOSIS — Z91013 Allergy to seafood: Secondary | ICD-10-CM

## 2020-04-29 DIAGNOSIS — R079 Chest pain, unspecified: Secondary | ICD-10-CM | POA: Diagnosis not present

## 2020-04-29 LAB — COMPREHENSIVE METABOLIC PANEL
ALT: 21 U/L (ref 0–44)
AST: 22 U/L (ref 15–41)
Albumin: 3.5 g/dL (ref 3.5–5.0)
Alkaline Phosphatase: 64 U/L (ref 38–126)
Anion gap: 12 (ref 5–15)
BUN: 12 mg/dL (ref 6–20)
CO2: 23 mmol/L (ref 22–32)
Calcium: 9 mg/dL (ref 8.9–10.3)
Chloride: 107 mmol/L (ref 98–111)
Creatinine, Ser: 0.56 mg/dL (ref 0.44–1.00)
GFR calc Af Amer: >60 mL/min (ref >60)
GFR calc non Af Amer: >60 mL/min (ref >60)
Glucose, Bld: 127 mg/dL — ABNORMAL HIGH (ref 70–99)
Potassium: 4 mmol/L (ref 3.5–5.1)
Sodium: 142 mmol/L (ref 135–145)
Total Bilirubin: 0.6 mg/dL (ref 0.3–1.2)
Total Protein: 7.4 g/dL (ref 6.5–8.1)

## 2020-04-29 LAB — CBC WITH DIFFERENTIAL/PLATELET
Abs Immature Granulocytes: 0.03 10*3/uL (ref 0.00–0.07)
Basophils Absolute: 0.1 10*3/uL (ref 0.0–0.1)
Basophils Relative: 1 %
Eosinophils Absolute: 0.2 10*3/uL (ref 0.0–0.5)
Eosinophils Relative: 2 %
HCT: 42.2 % (ref 36.0–46.0)
Hemoglobin: 14 g/dL (ref 12.0–15.0)
Immature Granulocytes: 0 %
Lymphocytes Relative: 47 %
Lymphs Abs: 4.1 10*3/uL — ABNORMAL HIGH (ref 0.7–4.0)
MCH: 30.9 pg (ref 26.0–34.0)
MCHC: 33.2 g/dL (ref 30.0–36.0)
MCV: 93.2 fL (ref 80.0–100.0)
Monocytes Absolute: 0.6 10*3/uL (ref 0.1–1.0)
Monocytes Relative: 7 %
Neutro Abs: 3.7 10*3/uL (ref 1.7–7.7)
Neutrophils Relative %: 43 %
Platelets: 328 10*3/uL (ref 150–400)
RBC: 4.53 MIL/uL (ref 3.87–5.11)
RDW: 14.3 % (ref 11.5–15.5)
Smear Review: NORMAL
WBC: 8.7 10*3/uL (ref 4.0–10.5)
nRBC: 0 % (ref 0.0–0.2)

## 2020-04-29 LAB — SARS CORONAVIRUS 2 BY RT PCR (HOSPITAL ORDER, PERFORMED IN ~~LOC~~ HOSPITAL LAB): SARS Coronavirus 2: NEGATIVE

## 2020-04-29 MED ORDER — METHYLPREDNISOLONE SODIUM SUCC 40 MG IJ SOLR
40.0000 mg | Freq: Four times a day (QID) | INTRAMUSCULAR | Status: AC
  Administered 2020-04-29 – 2020-04-30 (×4): 40 mg via INTRAVENOUS
  Filled 2020-04-29 (×5): qty 1

## 2020-04-29 MED ORDER — LEVOFLOXACIN IN D5W 750 MG/150ML IV SOLN
750.0000 mg | INTRAVENOUS | Status: DC
  Administered 2020-04-30 – 2020-05-01 (×2): 750 mg via INTRAVENOUS
  Filled 2020-04-29 (×2): qty 150

## 2020-04-29 MED ORDER — LOSARTAN POTASSIUM 50 MG PO TABS
100.0000 mg | ORAL_TABLET | Freq: Every day | ORAL | Status: DC
  Administered 2020-04-29 – 2020-05-02 (×4): 100 mg via ORAL
  Filled 2020-04-29 (×4): qty 2

## 2020-04-29 MED ORDER — GABAPENTIN 300 MG PO CAPS
300.0000 mg | ORAL_CAPSULE | Freq: Three times a day (TID) | ORAL | Status: DC
  Administered 2020-04-29 – 2020-05-02 (×9): 300 mg via ORAL
  Filled 2020-04-29 (×9): qty 1

## 2020-04-29 MED ORDER — TIZANIDINE HCL 4 MG PO TABS
4.0000 mg | ORAL_TABLET | Freq: Four times a day (QID) | ORAL | Status: DC | PRN
  Filled 2020-04-29: qty 1

## 2020-04-29 MED ORDER — IPRATROPIUM-ALBUTEROL 0.5-2.5 (3) MG/3ML IN SOLN: 3.0000 mL | Freq: Four times a day (QID) | RESPIRATORY_TRACT | Status: DC | PRN

## 2020-04-29 MED ORDER — UMECLIDINIUM-VILANTEROL 62.5-25 MCG/INH IN AEPB: 1.0000 | INHALATION_SPRAY | Freq: Every day | RESPIRATORY_TRACT | Status: DC

## 2020-04-29 MED ORDER — LEVOFLOXACIN IN D5W 750 MG/150ML IV SOLN
750.0000 mg | Freq: Once | INTRAVENOUS | Status: AC
  Administered 2020-04-29: 750 mg via INTRAVENOUS
  Filled 2020-04-29: qty 150

## 2020-04-29 MED ORDER — IPRATROPIUM-ALBUTEROL 0.5-2.5 (3) MG/3ML IN SOLN
3.0000 mL | Freq: Once | RESPIRATORY_TRACT | Status: AC
  Administered 2020-04-29: 3 mL via RESPIRATORY_TRACT
  Filled 2020-04-29: qty 3

## 2020-04-29 MED ORDER — HYDRALAZINE HCL 20 MG/ML IJ SOLN
10.0000 mg | Freq: Four times a day (QID) | INTRAMUSCULAR | Status: DC | PRN
  Administered 2020-05-01: 04:00:00 10 mg via INTRAVENOUS
  Filled 2020-04-29: qty 1

## 2020-04-29 MED ORDER — METHYLPREDNISOLONE SODIUM SUCC 125 MG IJ SOLR
125.0000 mg | Freq: Once | INTRAMUSCULAR | Status: AC
  Administered 2020-04-29: 125 mg via INTRAVENOUS
  Filled 2020-04-29: qty 2

## 2020-04-29 MED ORDER — ALBUTEROL SULFATE (2.5 MG/3ML) 0.083% IN NEBU
2.5000 mg | INHALATION_SOLUTION | Freq: Once | RESPIRATORY_TRACT | Status: AC
  Administered 2020-04-29: 2.5 mg via RESPIRATORY_TRACT
  Filled 2020-04-29: qty 3

## 2020-04-29 MED ORDER — LORAZEPAM 0.5 MG PO TABS
0.5000 mg | ORAL_TABLET | Freq: Once | ORAL | Status: AC
  Administered 2020-04-29: 0.5 mg via ORAL
  Filled 2020-04-29: qty 1

## 2020-04-29 MED ORDER — ENOXAPARIN SODIUM 40 MG/0.4ML ~~LOC~~ SOLN
40.0000 mg | SUBCUTANEOUS | Status: DC
  Filled 2020-04-29 (×2): qty 0.4

## 2020-04-29 MED ORDER — SODIUM CHLORIDE 0.9 % IV SOLN: INTRAVENOUS | Status: DC

## 2020-04-29 MED ORDER — TRAZODONE HCL 50 MG PO TABS
50.0000 mg | ORAL_TABLET | Freq: Every day | ORAL | Status: DC
  Administered 2020-04-30 – 2020-05-01 (×3): 50 mg via ORAL
  Filled 2020-04-29 (×3): qty 1

## 2020-04-29 MED ORDER — UMECLIDINIUM-VILANTEROL 62.5-25 MCG/INH IN AEPB
1.0000 | INHALATION_SPRAY | Freq: Every day | RESPIRATORY_TRACT | Status: DC
  Administered 2020-04-30 – 2020-05-01 (×2): 1 via RESPIRATORY_TRACT
  Filled 2020-04-29: qty 14

## 2020-04-29 MED ORDER — CYCLOBENZAPRINE HCL 10 MG PO TABS
10.0000 mg | ORAL_TABLET | Freq: Three times a day (TID) | ORAL | Status: DC | PRN
  Administered 2020-04-29 – 2020-05-01 (×3): 10 mg via ORAL
  Filled 2020-04-29 (×3): qty 1

## 2020-04-29 MED ORDER — LEVOTHYROXINE SODIUM 50 MCG PO TABS
50.0000 ug | ORAL_TABLET | Freq: Every day | ORAL | Status: DC
  Administered 2020-04-30 – 2020-05-02 (×3): 50 ug via ORAL
  Filled 2020-04-29 (×3): qty 1

## 2020-04-29 MED ORDER — IOHEXOL 350 MG/ML SOLN
75.0000 mL | Freq: Once | INTRAVENOUS | Status: AC | PRN
  Administered 2020-04-29: 75 mL via INTRAVENOUS

## 2020-04-29 MED ORDER — KETOROLAC TROMETHAMINE 30 MG/ML IJ SOLN
30.0000 mg | Freq: Once | INTRAMUSCULAR | Status: AC
  Administered 2020-04-29: 30 mg via INTRAVENOUS
  Filled 2020-04-29: qty 1

## 2020-04-29 MED ORDER — PREDNISONE 20 MG PO TABS
40.0000 mg | ORAL_TABLET | Freq: Every day | ORAL | Status: DC
  Administered 2020-05-01 – 2020-05-02 (×2): 40 mg via ORAL
  Filled 2020-04-29 (×2): qty 2

## 2020-04-29 MED ORDER — IPRATROPIUM-ALBUTEROL 0.5-2.5 (3) MG/3ML IN SOLN
3.0000 mL | Freq: Four times a day (QID) | RESPIRATORY_TRACT | Status: DC
  Administered 2020-04-29 – 2020-04-30 (×5): 3 mL via RESPIRATORY_TRACT
  Filled 2020-04-29 (×4): qty 3

## 2020-04-29 MED ORDER — IPRATROPIUM-ALBUTEROL 0.5-2.5 (3) MG/3ML IN SOLN
3.0000 mL | RESPIRATORY_TRACT | Status: DC | PRN
  Administered 2020-05-01: 3 mL via RESPIRATORY_TRACT
  Filled 2020-04-29: qty 3

## 2020-04-29 NOTE — ED Notes (Signed)
Pt in xray

## 2020-04-29 NOTE — ED Notes (Signed)
Pt has ripped off BP cuff and pulse ox. Placed back on pt at this time and informed of importance of keeping it on.

## 2020-04-29 NOTE — ED Notes (Signed)
One set of blood cultures drawn by Minette Brine RN and sent to lab in case ordered later. Drawn from R wrist.

## 2020-04-29 NOTE — Progress Notes (Signed)
PHARMACY -  BRIEF ANTIBIOTIC NOTE   Pharmacy has received consult(s) for Levaquin from an ED provider.  The patient's profile has been reviewed for ht/wt/allergies/indication/available labs.    One time order(s) placed by MD for Levaquin 750 mg  Further antibiotics/pharmacy consults should be ordered by admitting physician if indicated.                       Thank you, Lodema Parma A 04/29/2020  10:18 AM

## 2020-04-29 NOTE — ED Notes (Signed)
Pt transported to room 106

## 2020-04-29 NOTE — ED Notes (Signed)
Pt provided lunch tray.

## 2020-04-29 NOTE — ED Notes (Signed)
Admitting MD at bedside.

## 2020-04-29 NOTE — Progress Notes (Signed)
Patient experienced mild coughing episode during breathing treatment. Patient seems anxious and fearful, stating she cant breathe. Patient calmed and instructed on deep breathing exercises. SATs maintained 94% and above. RN aware. Will continue to monitor.

## 2020-04-29 NOTE — ED Provider Notes (Addendum)
[facility] Emergency Department Provider Note    First MD Initiated Contact with Patient 04/29/20 0920     (approximate)  I have reviewed the triage vital signs and the nursing notes.   HISTORY  Chief Complaint Shortness of Breath and Back Pain    HPI Catherine Giles is a 49 y.o. female the below listed past medical history not on home oxygen presents to the ER for evaluation of shortness of breath and pleuritic pain with productive cough.  States that she does feel like she is having trouble clearing her lungs does feel rattle and worsening wheezing.  Feels like she is not getting much improvement with her inhalers at home.  Is on chronic steroids.  No recent antibiotics.  States that she felt similar when she has been diagnosed with pneumonia in the past.  She denies any abdominal pain.  No nausea or vomiting.    Past Medical History:  Diagnosis Date  . Arthritis    RA  . COPD (chronic obstructive pulmonary disease) ([place])   . Emphysema of lung ([place])   . Family history of breast cancer    6/21 cancer genetic testing letter sent  . Hypertension   . Lupus ([place])   . Thyroid disease    Family History  Problem Relation Age of Onset  . Lung cancer Mother   . Diabetes Mother   . Breast cancer Mother 74  . Hypertension Father   . Cancer Maternal Aunt   . Ovarian cancer Maternal Aunt   . Diabetes Maternal Aunt   . Diabetes Maternal Uncle   . Hypertension Maternal Grandmother   . Diabetes Maternal Grandmother    Past Surgical History:  Procedure Laterality Date  . MOUTH SURGERY     Patient Active Problem List   Diagnosis Date Noted  . Vaginal discharge 03/19/2020  . Vaginal stenosis 03/19/2020  . Obesity 03/19/2020  . Insomnia 01/05/2020  . Complex regional pain syndrome I of upper limb 12/17/2019  . Pain of left hand 12/16/2019  . Essential hypertension 12/13/2019  . Centrilobular emphysema ([place]) 12/13/2019  . Elevated hemoglobin A1c  12/13/2019  . Hypothyroid 12/13/2019  . DDD (degenerative disc disease), lumbar 08/26/2019  . Multinodular goiter 08/14/2019  . Chronic left hip pain 05/05/2019  . Osteoarthritis of spine with radiculopathy, cervical region 05/05/2019  . Encounter for long-term (current) use of high-risk medication 02/14/2019  . Fibromyalgia 02/10/2019  . Rheumatoid arthritis involving multiple sites with positive rheumatoid factor ([place]) 02/10/2019  . Undifferentiated connective tissue disease ([place]) 02/10/2019  . Cervical radiculopathy 03/13/2018  . Vitamin D deficiency 10/11/2015      Prior to Admission medications   Medication Sig Start Date End Date Taking? Authorizing Provider  albuterol (VENTOLIN HFA) 108 (90 Base) MCG/ACT inhaler Inhale into the lungs. 02/12/19 04/05/20  [provider]  chlorpheniramine-HYDROcodone (TUSSIONEX PENNKINETIC ER) 10-8 MG/5ML SUER Take 5 mLs by mouth every 12 (twelve) hours as needed. 03/28/20   Norval Gable, MD  cyclobenzaprine (FLEXERIL) 10 MG tablet Take by mouth.    [provider]  fluticasone furoate-vilanterol (BREO ELLIPTA) 200-25 MCG/INH AEPB Inhale into the lungs.    [provider]  gabapentin (NEURONTIN) 300 MG capsule Take 1 capsule (300 mg total) by mouth 3 (three) times daily. 12/16/19   Cannady, Henrine Screws T, NP  ipratropium-albuterol (DUONEB) 0.5-2.5 (3) MG/3ML SOLN Take 3 mLs by nebulization every 6 (six) hours as needed. 12/02/19   Karen Kitchens, NP  levothyroxine (SYNTHROID) 50 MCG  tablet Take by mouth.    [provider]  losartan (COZAAR) 100 MG tablet Take by mouth.    [provider]  metroNIDAZOLE (FLAGYL) 500 MG tablet Take 1 tablet (500 mg total) by mouth 2 (two) times daily. 04/01/20   Malachy Mood, MD  tiZANidine (ZANAFLEX) 4 MG tablet Take by mouth.    [provider]  traZODone (DESYREL) 50 MG tablet Take 1 tablet (50 mg total) by mouth at bedtime. 01/05/20   Cannady, Henrine Screws T, NP    triamcinolone cream (KENALOG) 0.1 % Apply 1 application topically daily as needed. Apply to affected areas twice daily as needed. Avoid Face,Groin and underarms 03/23/20   Brendolyn Patty, MD  umeclidinium-vilanterol [facility] ELLIPTA) 62.5-25 MCG/INH AEPB Inhale into the lungs. 02/12/19   [provider]    Allergies Azithromycin, Penicillins, and Shellfish allergy    Social History Social History   Tobacco Use  . Smoking status: Current Every Day Smoker    Packs/day: 1.00    Years: 34.00    Pack years: 34.00    Types: Cigarettes  . Smokeless tobacco: Never Used  Vaping Use  . Vaping Use: Never used  Substance Use Topics  . Alcohol use: Yes    Comment: Occ  . Drug use: Not Currently    Review of Systems Patient denies headaches, rhinorrhea, blurry vision, numbness, shortness of breath, chest pain, edema, cough, abdominal pain, nausea, vomiting, diarrhea, dysuria, fevers, rashes or hallucinations unless otherwise stated above in HPI. ____________________________________________   PHYSICAL EXAM:  VITAL SIGNS: Vitals:   04/29/20 1015 04/29/20 1034  BP:  (!) 157/92  Pulse: 94 87  Resp: 18 20  Temp:    SpO2: (!) 89% 95%    Constitutional: Alert and oriented. Anxious appearing Eyes: Conjunctivae are normal.  Head: Atraumatic. Nose: No congestion/rhinnorhea. Mouth/Throat: Mucous membranes are moist.   Neck: No stridor. Painless ROM.  Cardiovascular: Normal rate, regular rhythm. Grossly normal heart sounds.  Good peripheral circulation. Respiratory: tachypnea with coarse bilateral expiratory wheeze and scattered rhonchi Gastrointestinal: Soft and nontender. No distention. No abdominal bruits. No CVA tenderness. Genitourinary:  Musculoskeletal: No lower extremity tenderness nor edema.  No joint effusions. Neurologic:  Normal speech and language. No gross focal neurologic deficits are appreciated. No facial droop Skin:  Skin is warm, dry and intact. No rash  noted. Psychiatric: Mood and affect are normal. Speech and behavior are normal.  ____________________________________________   LABS (all labs ordered are listed, but only abnormal results are displayed)  Results for orders placed or performed during the hospital encounter of 04/29/20 (from the past 24 hour(s))  CBC with Differential/Platelet     Status: Abnormal   Collection Time: 04/29/20  9:34 AM  Result Value Ref Range   WBC 8.7 4.0 - 10.5 K/uL   RBC 4.53 3.87 - 5.11 MIL/uL   Hemoglobin 14.0 12.0 - 15.0 g/dL   HCT 42.2 36 - 46 %   MCV 93.2 80.0 - 100.0 fL   MCH 30.9 26.0 - 34.0 pg   MCHC 33.2 30.0 - 36.0 g/dL   RDW 14.3 11.5 - 15.5 %   Platelets 328 150 - 400 K/uL   nRBC 0.0 0.0 - 0.2 %   Neutrophils Relative % 43 %   Neutro Abs 3.7 1.7 - 7.7 K/uL   Lymphocytes Relative 47 %   Lymphs Abs 4.1 (H) 0.7 - 4.0 K/uL   Monocytes Relative 7 %   Monocytes Absolute 0.6 0 - 1 K/uL   Eosinophils  Relative 2 %   Eosinophils Absolute 0.2 0 - 0 K/uL   Basophils Relative 1 %   Basophils Absolute 0.1 0 - 0 K/uL   WBC Morphology MORPHOLOGY UNREMARKABLE    RBC Morphology MORPHOLOGY UNREMARKABLE    Smear Review Normal platelet morphology    Immature Granulocytes 0 %   Abs Immature Granulocytes 0.03 0.00 - 0.07 K/uL  Comprehensive metabolic panel     Status: Abnormal   Collection Time: 04/29/20  9:34 AM  Result Value Ref Range   Sodium 142 135 - 145 mmol/L   Potassium 4.0 3.5 - 5.1 mmol/L   Chloride 107 98 - 111 mmol/L   CO2 23 22 - 32 mmol/L   Glucose, Bld 127 (H) 70 - 99 mg/dL   BUN 12 6 - 20 mg/dL   Creatinine, Ser 0.56 0.44 - 1.00 mg/dL   Calcium 9.0 8.9 - 10.3 mg/dL   Total Protein 7.4 6.5 - 8.1 g/dL   Albumin 3.5 3.5 - 5.0 g/dL   AST 22 15 - 41 U/L   ALT 21 0 - 44 U/L   Alkaline Phosphatase 64 38 - 126 U/L   Total Bilirubin 0.6 0.3 - 1.2 mg/dL   GFR calc non Af Amer >60 >60 mL/min   GFR calc Af Amer >60 >60 mL/min   Anion gap 12 5 - 15    ____________________________________________  EKG My review and personal interpretation at Time: 9:27   Indication: sob  Rate: 95  Rhythm: sinus Axis: normal Other: normal intervals, no stemi, lvh criteria ____________________________________________  RADIOLOGY  I personally reviewed all radiographic images ordered to evaluate for the above acute complaints and reviewed radiology reports and findings.  These findings were personally discussed with the patient.  Please see medical record for radiology report.  ____________________________________________   PROCEDURES  Procedure(s) performed:  .Critical Care Performed by: Merlyn Lot, MD Authorized by: Merlyn Lot, MD   Critical care provider statement:    Critical care time (minutes):  35   Critical care time was exclusive of:  Separately billable procedures and treating other patients   Critical care was necessary to treat or prevent imminent or life-threatening deterioration of the following conditions:  Respiratory failure   Critical care was time spent personally by me on the following activities:  Development of treatment plan with patient or surrogate, discussions with consultants, evaluation of patient's response to treatment, examination of patient, obtaining history from patient or surrogate, ordering and performing treatments and interventions, ordering and review of laboratory studies, ordering and review of radiographic studies, pulse oximetry, re-evaluation of patient's condition and review of old charts      Critical Care performed: yes ____________________________________________   INITIAL IMPRESSION / [place] / ED COURSE  Pertinent labs & imaging results that were available during my care of the patient were reviewed by me and considered in my medical decision making (see chart for details).   DDX: Asthma, copd, CHF, pna, ptx, malignancy, Pe, anemia   Catherine Giles is a 49 y.o. who  presents to the ED with symptoms as described above.  Patient does appear dyspneic but protecting her airway.  Satting well on 2 L.  Will hold oxygen and see if she is actually desaturating.  Does sound clinically consistent with COPD exacerbation therefore will give nebs and steroids.  I will lower suspicion for CHF or PE.  The patient will be placed on continuous pulse oximetry and telemetry for monitoring.  Laboratory evaluation will be sent  to evaluate for the above complaints.     Clinical Course as of Apr 29 1121  Thu Apr 29, 2020  1050 Patient did desaturate to 86% on room air was placed back on nasal cannula. She does not wear home oxygen. Anticipation patient will require hospitalization as she does have evidence of infiltrates on chest x-ray. Has multiple allergies therefore will treat with Levaquin. Tested for Covid but patient just recently vaccinated.   [PR]    Clinical Course User Index [PR] Merlyn Lot, MD   Case discussed with Dr. Francine Graven hospitalist service.  Agree with plan for admission.  Have requested CTA to exclude PE or complicated pneumonia.  Have discussed with the patient and available family all diagnostics and treatments performed thus far and all questions were answered to the best of my ability. The patient demonstrates understanding and agreement with plan.   The patient was evaluated in Emergency Department today for the symptoms described in the history of present illness. He/she was evaluated in the context of the global COVID-19 pandemic, which necessitated consideration that the patient might be at risk for infection with the SARS-CoV-2 virus that causes COVID-19. Institutional protocols and algorithms that pertain to the evaluation of patients at risk for COVID-19 are in a state of rapid change based on information released by regulatory bodies including the CDC and federal and state organizations. These policies and algorithms were followed during the  patient's care in the ED.  As part of my medical decision making, I reviewed the following data within the [place] notes reviewed and incorporated, Labs reviewed, notes from prior ED visits and [place] Controlled Substance Database   ____________________________________________   FINAL CLINICAL IMPRESSION(S) / ED DIAGNOSES  Final diagnoses:  Acute respiratory failure with hypoxia ([place])  Community acquired pneumonia, unspecified laterality  COPD with acute exacerbation ([place])      NEW MEDICATIONS STARTED DURING THIS VISIT:  New Prescriptions   No medications on file     Note:  This document was prepared using Dragon voice recognition software and may include unintentional dictation errors.       Merlyn Lot, MD 04/29/20 1122

## 2020-04-29 NOTE — H&P (Signed)
History and Physical    Catherine Giles LAG:[id number] DOB: 06-Feb-1971 DOA: 04/29/2020  PCP: Catherine Maple, MD   Patient coming from: Home  I have personally briefly reviewed patient's old medical records in [place]  Chief Complaint: Shortness of breath  HPI: Catherine Giles is a 49 y.o. female with medical history significant for COPD, history of lupus, arthritis and hypertension who presents to the emergency room for evaluation shortness of breath, cough productive of occasional yellow phlegm as well as diffuse wheezes.  She denies having any fever.  Patient states that her symptoms started after she received a second dose of the COVID 19 vaccine.  She initially developed what she thought was a common cold and has not fully recovered from that.  Her pulse oximetry on room air at rest was 90% and with exertion she dropped to 86%. She requires oxygen supplementation at 2 L to maintain pulse oximetry greater than 92%.  Shortness of breath is also associated with pleuritic chest pain.   She denies having any nausea, vomiting, abdominal pain or any changes in her bowel habits, she denies having any urinary symptoms Patient had use of bronchodilators at home without any improvement in her symptoms Chest x-ray showed increased basilar opacity at the RIGHT lung base compared to the prior imaging study, suspicious for developing pneumonia, perhaps multifocal given new nodular opacities, quite subtle in the upper lobes. Suggest follow-up to ensure resolution. Lingular scarring and question of bronchiectasis along the RIGHT heart border raising the question of chronic component. Twelve-lead EKG shows no acute findings   ED Course: Patient is a 49 year old female with a history of COPD who presents to the emergency room for evaluation of worsening shortness of breath from her baseline associated with a cough productive of yellow phlegm and wheezing. CXR shows a right lower lobe infiltrate.   Patient was hypoxic at rest she had a pulse oximetry of 90% but dropped to the low 80s with exertion and is currently on 2 L of oxygen.  She will be admitted to the hospital for further evaluation.  Review of Systems: As per HPI otherwise 10 point review of systems negative.    Past Medical History:  Diagnosis Date  . Arthritis    RA  . COPD (chronic obstructive pulmonary disease) ([place])   . Emphysema of lung ([place])   . Family history of breast cancer    6/21 cancer genetic testing letter sent  . Hypertension   . Lupus ([place])   . Thyroid disease     Past Surgical History:  Procedure Laterality Date  . MOUTH SURGERY       reports that she has been smoking cigarettes. She has a 34.00 pack-year smoking history. She has never used smokeless tobacco. She reports current alcohol use. She reports previous drug use.  Allergies  Allergen Reactions  . Azithromycin Anaphylaxis  . Penicillins     Did it involve swelling of the face/tongue/throat, SOB, or low BP? Yes Did it involve sudden or severe rash/hives, skin peeling, or any reaction on the inside of your mouth or nose? No Did you need to seek medical attention at a hospital or doctor's office? No When did it last happen? If all above answers are "NO", may proceed with cephalosporin use.   . Shellfish Allergy     Family History  Problem Relation Age of Onset  . Lung cancer Mother   . Diabetes Mother   . Breast cancer Mother 55  . Hypertension  Father   . Cancer Maternal Aunt   . Ovarian cancer Maternal Aunt   . Diabetes Maternal Aunt   . Diabetes Maternal Uncle   . Hypertension Maternal Grandmother   . Diabetes Maternal Grandmother      Prior to Admission medications   Medication Sig Start Date End Date Taking? Authorizing Provider  albuterol (VENTOLIN HFA) 108 (90 Base) MCG/ACT inhaler Inhale into the lungs. 02/12/19 04/05/20  [provider]  chlorpheniramine-HYDROcodone (TUSSIONEX PENNKINETIC ER) 10-8  MG/5ML SUER Take 5 mLs by mouth every 12 (twelve) hours as needed. 03/28/20   Norval Gable, MD  cyclobenzaprine (FLEXERIL) 10 MG tablet Take by mouth.    [provider]  fluticasone furoate-vilanterol (BREO ELLIPTA) 200-25 MCG/INH AEPB Inhale into the lungs.    [provider]  gabapentin (NEURONTIN) 300 MG capsule Take 1 capsule (300 mg total) by mouth 3 (three) times daily. 12/16/19   Cannady, Henrine Screws T, NP  ipratropium-albuterol (DUONEB) 0.5-2.5 (3) MG/3ML SOLN Take 3 mLs by nebulization every 6 (six) hours as needed. 12/02/19   Karen Kitchens, NP  leflunomide (ARAVA) 20 MG tablet Take by mouth. 05/05/19 05/04/20  [provider]  levothyroxine (SYNTHROID) 50 MCG tablet Take by mouth.    [provider]  losartan (COZAAR) 100 MG tablet Take by mouth.    [provider]  metroNIDAZOLE (FLAGYL) 500 MG tablet Take 1 tablet (500 mg total) by mouth 2 (two) times daily. 04/01/20   Malachy Mood, MD  tiZANidine (ZANAFLEX) 4 MG tablet Take by mouth.    [provider]  traZODone (DESYREL) 50 MG tablet Take 1 tablet (50 mg total) by mouth at bedtime. 01/05/20   Cannady, Henrine Screws T, NP  triamcinolone cream (KENALOG) 0.1 % Apply 1 application topically daily as needed. Apply to affected areas twice daily as needed. Avoid Face,Groin and underarms 03/23/20   Brendolyn Patty, MD  umeclidinium-vilanterol [facility] ELLIPTA) 62.5-25 MCG/INH AEPB Inhale into the lungs. 02/12/19   [provider]    Physical Exam: Vitals:   04/29/20 0928 04/29/20 0929 04/29/20 1015 04/29/20 1034  BP:  (!) 166/148  (!) 157/92  Pulse:  (!) 105 94 87  Resp:  (!) 22 18 20   Temp:  98.2 F (36.8 C)    TempSrc:  Oral    SpO2:  97% (!) 89% 95%  Weight: 77.1 kg     Height: 5\' 3"  (1.6 m)        Vitals:   04/29/20 0928 04/29/20 0929 04/29/20 1015 04/29/20 1034  BP:  (!) 166/148  (!) 157/92  Pulse:  (!) 105 94 87  Resp:  (!) 22 18 20   Temp:  98.2 F (36.8 C)    TempSrc:  Oral     SpO2:  97% (!) 89% 95%  Weight: 77.1 kg     Height: 5\' 3"  (1.6 m)       Constitutional: NAD, alert and oriented x 3.  Acutely ill-appearing Eyes: PERRL, lids and conjunctivae normal ENMT: Mucous membranes are moist.  Neck: normal, supple, no masses, no thyromegaly Respiratory: Scattered rhonchi at the bases, diffuse wheezing, no crackles. Normal respiratory effort. No accessory muscle use.  Cardiovascular: Regular rate and rhythm, no murmurs / rubs / gallops. No extremity edema. 2+ pedal pulses. No carotid bruits.  Abdomen: no tenderness, no masses palpated. No hepatosplenomegaly. Bowel sounds positive.  Musculoskeletal: no clubbing / cyanosis. No joint deformity upper and lower extremities.  Skin: no rashes, lesions, ulcers.  Neurologic: No gross focal neurologic  deficit. Psychiatric: Normal mood and affect.   Labs on Admission: I have personally reviewed following labs and imaging studies  CBC: Recent Labs  Lab 04/29/20 0934  WBC 8.7  NEUTROABS 3.7  HGB 14.0  HCT 42.2  MCV 93.2  PLT 035   Basic Metabolic Panel: Recent Labs  Lab 04/29/20 0934  NA 142  K 4.0  CL 107  CO2 23  GLUCOSE 127*  BUN 12  CREATININE 0.56  CALCIUM 9.0   GFR: Estimated Creatinine Clearance: 81.8 mL/min (by C-G formula based on SCr of 0.56 mg/dL). Liver Function Tests: Recent Labs  Lab 04/29/20 0934  AST 22  ALT 21  ALKPHOS 64  BILITOT 0.6  PROT 7.4  ALBUMIN 3.5   No results for input(s): LIPASE, AMYLASE in the last 168 hours. No results for input(s): AMMONIA in the last 168 hours. Coagulation Profile: No results for input(s): INR, PROTIME in the last 168 hours. Cardiac Enzymes: No results for input(s): CKTOTAL, CKMB, CKMBINDEX, TROPONINI in the last 168 hours. BNP (last 3 results) No results for input(s): PROBNP in the last 8760 hours. HbA1C: No results for input(s): HGBA1C in the last 72 hours. CBG: No results for input(s): GLUCAP in the last 168 hours. Lipid  Profile: No results for input(s): CHOL, HDL, LDLCALC, TRIG, CHOLHDL, LDLDIRECT in the last 72 hours. Thyroid Function Tests: No results for input(s): TSH, T4TOTAL, FREET4, T3FREE, THYROIDAB in the last 72 hours. Anemia Panel: No results for input(s): VITAMINB12, FOLATE, FERRITIN, TIBC, IRON, RETICCTPCT in the last 72 hours. Urine analysis: No results found for: COLORURINE, APPEARANCEUR, [place], [place], GLUCOSEU, HGBUR, BILIRUBINUR, KETONESUR, PROTEINUR, UROBILINOGEN, NITRITE, LEUKOCYTESUR  Radiological Exams on Admission: DG Chest Portable 1 View  Result Date: 04/29/2020 CLINICAL DATA:  Shortness of breath, cough. EXAM: PORTABLE CHEST 1 VIEW COMPARISON:  April 05, 2020 FINDINGS: Increased basilar opacity at the RIGHT lung base compared to the prior imaging study. Similar appearance of basilar opacities the LEFT lung base. Trachea is midline. Cardiomediastinal contours and hilar structures are normal. Subtle nodular density projectsa over the RIGHT upper chest, new compared to the prior study along with subtle opacity in the LEFT upper lobe as well. No overt signs of effusion. Question of scarring and or bronchiectasis along the RIGHT heart border. Visualized skeletal structures on limited assessment are unremarkable. IMPRESSION: 1. Increased basilar opacity at the RIGHT lung base compared to the prior imaging study, suspicious for developing pneumonia, perhaps multifocal given new nodular opacities, quite subtle in the upper lobes. Suggest follow-up to ensure resolution. 2. Lingular scarring and question of bronchiectasis along the RIGHT heart border raising the question of chronic component. Electronically Signed   By: Zetta Bills M.D.   On: 04/29/2020 09:57    EKG: Independently reviewed.  Normal sinus rhythm  Assessment/Plan Principal Problem:   CAP (community acquired pneumonia) Active Problems:   Rheumatoid arthritis involving multiple sites with positive rheumatoid factor ([place])    Essential hypertension   Hypothyroid   COPD with acute bronchitis (HCC)     Community-acquired pneumonia Patient presents for evaluation of worsening shortness of breath from baseline associated with productive cough and pleuritic chest pain Imaging is suggestive of increased basilar opacity at the RIGHT lung base compared to the prior imaging study, suspicious for developing pneumonia, perhaps multifocal given new nodular opacities, quite subtle in the upper lobes. Suggest follow-up to ensure resolution. Will obtain CT scan of the chest for further evaluation Place patient on Levaquin 750 mg IV daily Follow-up results of  blood and sputum culture    COPD with acute bronchitis Place patient on inhaled and systemic steroids Continue scheduled and as needed bronchodilator therapy Continue oxygen supplementation to maintain pulse oximetry greater than 92%   Hypothyroidism Continue Synthroid   Hypertension Poorly controlled Continue Cozaar Add IV hydralazine for SBP > 144mmHg   DVT prophylaxis: Lovenox Code Status: Full code Family Communication: Greater than 50% of time was spent discussing patient's condition and plan of care with her at the bedside.  All questions and concerns have been addressed.  She verbalizes understanding and agrees with the plan. Disposition Plan: Back to previous home environment Consults called: None    Anslie Spadafora MD [facility]     04/29/2020, 11:33 AM

## 2020-04-29 NOTE — ED Triage Notes (Signed)
Pt arrives [facility] from home for SOB, low back pain. Hx COPD. ST 100, wheezing, took breathing treatments at home this AM. 97.2. 98% on 2 L, not on O2 at home, was 90% RA. States cough but denies fever. Hx pneumonia. Had 2nd COVID shot about a week or two ago and has been feeling bad since.

## 2020-04-29 NOTE — ED Notes (Signed)
Pt had ambulated to bathroom. Upon return had de-satted on RA into the upper 80's. Provided breathing treatment at this time.

## 2020-04-30 ENCOUNTER — Inpatient Hospital Stay (HOSPITAL_COMMUNITY)
Admit: 2020-04-30 | Discharge: 2020-04-30 | Disposition: A | Discharge status: Home / Self Care | Payer: Medicare Other | Attending: Internal Medicine | Admitting: Internal Medicine

## 2020-04-30 ENCOUNTER — Inpatient Hospital Stay: Payer: Medicare Other

## 2020-04-30 ENCOUNTER — Inpatient Hospital Stay: Admit: 2020-04-30 | Payer: Medicare Other

## 2020-04-30 DIAGNOSIS — I10 Essential (primary) hypertension: Secondary | ICD-10-CM

## 2020-04-30 DIAGNOSIS — J189 Pneumonia, unspecified organism: Principal | ICD-10-CM

## 2020-04-30 DIAGNOSIS — J209 Acute bronchitis, unspecified: Secondary | ICD-10-CM

## 2020-04-30 DIAGNOSIS — J440 Chronic obstructive pulmonary disease with acute lower respiratory infection: Secondary | ICD-10-CM

## 2020-04-30 DIAGNOSIS — J9859 Other diseases of mediastinum, not elsewhere classified: Secondary | ICD-10-CM

## 2020-04-30 DIAGNOSIS — R079 Chest pain, unspecified: Secondary | ICD-10-CM

## 2020-04-30 LAB — CBC
HCT: 38.9 % (ref 36.0–46.0)
Hemoglobin: 13.3 g/dL (ref 12.0–15.0)
MCH: 31.6 pg (ref 26.0–34.0)
MCHC: 34.2 g/dL (ref 30.0–36.0)
MCV: 92.4 fL (ref 80.0–100.0)
Platelets: 333 K/uL (ref 150–400)
RBC: 4.21 MIL/uL (ref 3.87–5.11)
RDW: 14.4 % (ref 11.5–15.5)
WBC: 13.8 K/uL — ABNORMAL HIGH (ref 4.0–10.5)
nRBC: 0 % (ref 0.0–0.2)

## 2020-04-30 LAB — BASIC METABOLIC PANEL
Anion gap: 9 (ref 5–15)
BUN: 16 mg/dL (ref 6–20)
CO2: 23 mmol/L (ref 22–32)
Calcium: 8.9 mg/dL (ref 8.9–10.3)
Chloride: 107 mmol/L (ref 98–111)
Creatinine, Ser: 0.54 mg/dL (ref 0.44–1.00)
GFR calc Af Amer: >60 mL/min (ref >60)
GFR calc non Af Amer: >60 mL/min (ref >60)
Glucose, Bld: 233 mg/dL — ABNORMAL HIGH (ref 70–99)
Potassium: 3.9 mmol/L (ref 3.5–5.1)
Sodium: 139 mmol/L (ref 135–145)

## 2020-04-30 LAB — ECHOCARDIOGRAM COMPLETE
Height: 63 in
Weight: 2720 [oz_av]

## 2020-04-30 LAB — HIV ANTIBODY (ROUTINE TESTING W REFLEX): HIV Screen 4th Generation wRfx: NONREACTIVE

## 2020-04-30 LAB — TSH: TSH: 0.491 u[IU]/mL (ref 0.350–4.500)

## 2020-04-30 MED ORDER — ADULT MULTIVITAMIN W/MINERALS CH
1.0000 | ORAL_TABLET | Freq: Every day | ORAL | Status: DC
  Administered 2020-04-30 – 2020-05-02 (×3): 1 via ORAL
  Filled 2020-04-30 (×3): qty 1

## 2020-04-30 MED ORDER — IPRATROPIUM-ALBUTEROL 0.5-2.5 (3) MG/3ML IN SOLN
3.0000 mL | Freq: Three times a day (TID) | RESPIRATORY_TRACT | Status: DC
  Administered 2020-04-30: 19:00:00 3 mL via RESPIRATORY_TRACT
  Filled 2020-04-30: qty 3

## 2020-04-30 MED ORDER — LORAZEPAM 2 MG/ML IJ SOLN
1.0000 mg | Freq: Once | INTRAMUSCULAR | Status: AC
  Administered 2020-04-30: 1 mg via INTRAVENOUS
  Filled 2020-04-30: qty 1

## 2020-04-30 MED ORDER — GADOBUTROL 1 MMOL/ML IV SOLN
7.0000 mL | Freq: Once | INTRAVENOUS | Status: AC | PRN
  Administered 2020-04-30: 7 mL via INTRAVENOUS

## 2020-04-30 MED ORDER — ENSURE ENLIVE PO LIQD
237.0000 mL | ORAL | Status: DC
  Administered 2020-04-30 – 2020-05-01 (×2): 237 mL via ORAL

## 2020-04-30 MED ORDER — ACETAMINOPHEN 325 MG PO TABS
650.0000 mg | ORAL_TABLET | Freq: Four times a day (QID) | ORAL | Status: DC | PRN
  Administered 2020-04-30 – 2020-05-02 (×3): 650 mg via ORAL
  Filled 2020-04-30 (×3): qty 2

## 2020-04-30 MED ORDER — HYDROCOD POLST-CPM POLST ER 10-8 MG/5ML PO SUER
5.0000 mL | Freq: Two times a day (BID) | ORAL | Status: DC | PRN
  Administered 2020-04-30 – 2020-05-01 (×3): 5 mL via ORAL
  Filled 2020-04-30 (×3): qty 5

## 2020-04-30 NOTE — Progress Notes (Signed)
*  PRELIMINARY RESULTS* Echocardiogram 2D Echocardiogram has been performed.  Catherine Giles 04/30/2020, 2:11 PM

## 2020-04-30 NOTE — Consult Note (Signed)
Hematology/Oncology Consult note [facility] [phone] [phone]  Patient Care Team: Guadalupe Maple, MD as PCP - General (Family Medicine)   Name of the patient: Catherine Giles  [id number]  1971/10/09    Reason for consult: abnormal CT chest   Referring physician- Dr. Manuella Ghazi  Date of visit: 04/30/2020    History of presenting illness- Patient is a 49 year old female with a past medical history significant for lupus, hypertension, COPD who was admitted to the hospital for acute hypoxic respiratory failure.  She underwent a CT chest which incidentally showed borderline enlarged bilateral axillary lymph nodes up to 11 mm.  2.5 x 1.9 cm mass in the superior mediastinum between the brachiocephalic and left common carotid arteries.  The mass is separate from and below the thyroid gland.  Thyroid gland appears normal.  Subtle geographic hypodensity involving the right liver.  2 cm enhancing lesion in the caudate lobe.  Oncology was therefore consulted.  She subsequently had an MRI abdomen which showed caudate lobe lesion was consistent with focal nodular hyperplasia and repeat CT scan in 6 months was recommended.  1.8 cm mid estimates nodule noted on ultrasound thyroid along with 3.1 cm superior mediastinal indeterminate mass.  Patient is somewhat drowsy at the time of my visit today.  She reports feeling fatigued and short of breath  ECOG PS- 2  Pain scale- 4   Review of systems- Review of Systems  Constitutional: Positive for malaise/fatigue. Negative for chills, fever and weight loss.  HENT: Negative for congestion, ear discharge and nosebleeds.   Eyes: Negative for blurred vision.  Respiratory: Positive for shortness of breath. Negative for cough, hemoptysis, sputum production and wheezing.   Cardiovascular: Negative for chest pain, palpitations, orthopnea and claudication.  Gastrointestinal: Negative for abdominal pain, blood in stool,  constipation, diarrhea, heartburn, melena, nausea and vomiting.  Genitourinary: Negative for dysuria, flank pain, frequency, hematuria and urgency.  Musculoskeletal: Negative for back pain, joint pain and myalgias.  Skin: Negative for rash.  Neurological: Negative for dizziness, tingling, focal weakness, seizures, weakness and headaches.  Endo/Heme/Allergies: Does not bruise/bleed easily.  Psychiatric/Behavioral: Negative for depression and suicidal ideas. The patient does not have insomnia.     Allergies  Allergen Reactions  . Azithromycin Anaphylaxis  . Penicillins     Did it involve swelling of the face/tongue/throat, SOB, or low BP? Yes Did it involve sudden or severe rash/hives, skin peeling, or any reaction on the inside of your mouth or nose? No Did you need to seek medical attention at a hospital or doctor's office? No When did it last happen? If all above answers are "NO", may proceed with cephalosporin use.   Marland Kitchen Shellfish Allergy     Patient Active Problem List   Diagnosis Date Noted  . CAP (community acquired pneumonia) 04/29/2020  . COPD with acute bronchitis ([place]) 04/29/2020  . Vaginal discharge 03/19/2020  . Vaginal stenosis 03/19/2020  . Obesity 03/19/2020  . Insomnia 01/05/2020  . Complex regional pain syndrome I of upper limb 12/17/2019  . Pain of left hand 12/16/2019  . Essential hypertension 12/13/2019  . Centrilobular emphysema ([place]) 12/13/2019  . Elevated hemoglobin A1c 12/13/2019  . Hypothyroid 12/13/2019  . DDD (degenerative disc disease), lumbar 08/26/2019  . Multinodular goiter 08/14/2019  . Chronic left hip pain 05/05/2019  . Osteoarthritis of spine with radiculopathy, cervical region 05/05/2019  . Encounter for long-term (current) use of high-risk medication 02/14/2019  . Fibromyalgia 02/10/2019  . Rheumatoid arthritis involving multiple  sites with positive rheumatoid factor ([place]) 02/10/2019  . Undifferentiated connective tissue disease ([place])  02/10/2019  . Cervical radiculopathy 03/13/2018  . Vitamin D deficiency 10/11/2015     Past Medical History:  Diagnosis Date  . Arthritis    RA  . COPD (chronic obstructive pulmonary disease) ([place])   . Emphysema of lung ([place])   . Family history of breast cancer    6/21 cancer genetic testing letter sent  . Hypertension   . Lupus ([place])   . Thyroid disease      Past Surgical History:  Procedure Laterality Date  . MOUTH SURGERY      Social History   Socioeconomic History  . Marital status: Divorced    Spouse name: Not on file  . Number of children: Not on file  . Years of education: Not on file  . Highest education level: Not on file  Occupational History  . Not on file  Tobacco Use  . Smoking status: Current Every Day Smoker    Packs/day: 1.00    Years: 34.00    Pack years: 34.00    Types: Cigarettes  . Smokeless tobacco: Never Used  Vaping Use  . Vaping Use: Never used  Substance and Sexual Activity  . Alcohol use: Yes    Comment: Occ  . Drug use: Not Currently  . Sexual activity: Yes  Other Topics Concern  . Not on file  Social History Narrative  . Not on file   Social Determinants of Health   Financial Resource Strain:   . Difficulty of Paying Living Expenses:   Food Insecurity:   . Worried About Charity fundraiser in the Last Year:   . Arboriculturist in the Last Year:   Transportation Needs:   . Film/video editor (Medical):   Marland Kitchen Lack of Transportation (Non-Medical):   Physical Activity:   . Days of Exercise per Week:   . Minutes of Exercise per Session:   Stress:   . Feeling of Stress :   Social Connections:   . Frequency of Communication with Friends and Family:   . Frequency of Social Gatherings with Friends and Family:   . Attends Religious Services:   . Active Member of Clubs or Organizations:   . Attends Archivist Meetings:   Marland Kitchen Marital Status:   Intimate Partner Violence:   . Fear of Current or Ex-Partner:   .  Emotionally Abused:   Marland Kitchen Physically Abused:   . Sexually Abused:      Family History  Problem Relation Age of Onset  . Lung cancer Mother   . Diabetes Mother   . Breast cancer Mother 8  . Hypertension Father   . Cancer Maternal Aunt   . Ovarian cancer Maternal Aunt   . Diabetes Maternal Aunt   . Diabetes Maternal Uncle   . Hypertension Maternal Grandmother   . Diabetes Maternal Grandmother      Current Facility-Administered Medications:  .  0.9 %  sodium chloride infusion, , Intravenous, Continuous, Agbata, Tochukwu, MD, Last Rate: 100 mL/hr at 04/30/20 1238, New Bag at 04/30/20 1238 .  acetaminophen (TYLENOL) tablet 650 mg, 650 mg, Oral, Q6H PRN, Max Sane, MD, 650 mg at 04/30/20 1240 .  chlorpheniramine-HYDROcodone (TUSSIONEX) 10-8 MG/5ML suspension 5 mL, 5 mL, Oral, Q12H PRN, Lang Snow, NP, 5 mL at 04/30/20 1018 .  cyclobenzaprine (FLEXERIL) tablet 10 mg, 10 mg, Oral, TID PRN, Agbata, Tochukwu, MD, 10 mg at 04/30/20 1017 .  enoxaparin (LOVENOX) injection 40 mg, 40 mg, Subcutaneous, Q24H, Agbata, Tochukwu, MD .  feeding supplement (ENSURE ENLIVE) (ENSURE ENLIVE) liquid 237 mL, 237 mL, Oral, Q24H, Manuella Ghazi, Vipul, MD, 237 mL at 04/30/20 1240 .  gabapentin (NEURONTIN) capsule 300 mg, 300 mg, Oral, TID, Agbata, Tochukwu, MD, 300 mg at 04/30/20 1017 .  hydrALAZINE (APRESOLINE) injection 10 mg, 10 mg, Intravenous, Q6H PRN, Agbata, Tochukwu, MD .  ipratropium-albuterol (DUONEB) 0.5-2.5 (3) MG/3ML nebulizer solution 3 mL, 3 mL, Nebulization, Q6H, Agbata, Tochukwu, MD, 3 mL at 04/30/20 1358 .  ipratropium-albuterol (DUONEB) 0.5-2.5 (3) MG/3ML nebulizer solution 3 mL, 3 mL, Nebulization, Q4H PRN, Agbata, Tochukwu, MD .  levofloxacin (LEVAQUIN) IVPB 750 mg, 750 mg, Intravenous, Q24H, Agbata, Tochukwu, MD, Last Rate: 100 mL/hr at 04/30/20 1240, 750 mg at 04/30/20 1240 .  levothyroxine (SYNTHROID) tablet 50 mcg, 50 mcg, Oral, QAC breakfast, Agbata, Tochukwu, MD, 50 mcg at 04/30/20  0636 .  losartan (COZAAR) tablet 100 mg, 100 mg, Oral, Daily, Agbata, Tochukwu, MD, 100 mg at 04/30/20 1017 .  multivitamin with minerals tablet 1 tablet, 1 tablet, Oral, Daily, Manuella Ghazi, Vipul, MD .  Margrett Rud methylPREDNISolone sodium succinate (SOLU-MEDROL) 40 mg/mL injection 40 mg, 40 mg, Intravenous, Q6H, 40 mg at 04/30/20 1240 **FOLLOWED BY** predniSONE (DELTASONE) tablet 40 mg, 40 mg, Oral, Q breakfast, Agbata, Tochukwu, MD .  tiZANidine (ZANAFLEX) tablet 4 mg, 4 mg, Oral, Q6H PRN, Agbata, Tochukwu, MD .  traZODone (DESYREL) tablet 50 mg, 50 mg, Oral, QHS, Agbata, Tochukwu, MD, 50 mg at 04/30/20 0030 .  umeclidinium-vilanterol (ANORO ELLIPTA) 62.5-25 MCG/INH 1 puff, 1 puff, Inhalation, Q1500, Oswald Hillock [facility]   Physical exam:  Vitals:   04/30/20 0813 04/30/20 1231 04/30/20 1249 04/30/20 1428  BP: (!) 168/84 (!) 156/86 (!) 160/82 (!) 153/76  Pulse: 80 (!) 101 (!) 108 (!) 107  Resp: (!) 22 (!) 24 (!) 24 20  Temp: 98 F (36.7 C) 98.5 F (36.9 C) 98.7 F (37.1 C) 98.5 F (36.9 C)  TempSrc: Oral Oral Oral Oral  SpO2: 96% 94% 96% 97%  Weight:      Height:       Physical Exam Constitutional:      Comments: Appears drowsy  HENT:     Head: Normocephalic and atraumatic.  Eyes:     Pupils: Pupils are equal, round, and reactive to light.  Cardiovascular:     Rate and Rhythm: Normal rate and regular rhythm.     Heart sounds: Normal heart sounds.  Pulmonary:     Effort: Pulmonary effort is normal.     Breath sounds: Normal breath sounds.  Abdominal:     General: Bowel sounds are normal.     Palpations: Abdomen is soft.  Musculoskeletal:     Cervical back: Normal range of motion.  Skin:    General: Skin is warm and dry.  Neurological:     Mental Status: She is alert and oriented to person, place, and time.        CMP Latest Ref Rng & Units 04/30/2020  Glucose 70 - 99 mg/dL 233(H)  BUN 6 - 20 mg/dL 16  Creatinine 0.44 - 1.00 mg/dL 0.54  Sodium 135 - 145 mmol/L 139    Potassium 3.5 - 5.1 mmol/L 3.9  Chloride 98 - 111 mmol/L 107  CO2 22 - 32 mmol/L 23  Calcium 8.9 - 10.3 mg/dL 8.9  Total Protein 6.5 - 8.1 g/dL -  Total Bilirubin 0.3 - 1.2 mg/dL -  Alkaline Phos 38 - 126  U/L -  AST 15 - 41 U/L -  ALT 0 - 44 U/L -   CBC Latest Ref Rng & Units 04/30/2020  WBC 4.0 - 10.5 K/uL 13.8(H)  Hemoglobin 12.0 - 15.0 g/dL 13.3  Hematocrit 36 - 46 % 38.9  Platelets 150 - 400 K/uL 333    @IMAGES @  DG Chest 2 View  Result Date: 04/05/2020 CLINICAL DATA:  Chest pain. EXAM: CHEST - 2 VIEW COMPARISON:  December 02, 2019. FINDINGS: The heart size and mediastinal contours are within normal limits. No pneumothorax or pleural effusion is noted. Mild lingular subsegmental atelectasis is noted. Right lung is clear. The visualized skeletal structures are unremarkable. IMPRESSION: Mild lingular subsegmental atelectasis. Electronically Signed   By: Marijo Conception M.D.   On: 04/05/2020 17:28   CT Angio Chest PE W and/or Wo Contrast  Result Date: 04/29/2020 CLINICAL DATA:  Shortness of breath and pleuritic chest pain with productive cough. EXAM: CT ANGIOGRAPHY CHEST WITH CONTRAST TECHNIQUE: Multidetector CT imaging of the chest was performed using the standard protocol during bolus administration of intravenous contrast. Multiplanar CT image reconstructions and MIPs were obtained to evaluate the vascular anatomy. CONTRAST:  34mL OMNIPAQUE IOHEXOL 350 MG/ML SOLN COMPARISON:  Chest x-ray from same day. FINDINGS: Cardiovascular: Satisfactory opacification of the pulmonary arteries to the segmental level. No evidence of pulmonary embolism. Normal heart size. No pericardial effusion. No thoracic aortic aneurysm or dissection. Mediastinum/Nodes: There are a few borderline enlarged bilateral axillary lymph nodes measuring up to 11 mm in short axis (series 4, image 6). No enlarged mediastinal or hilar lymph nodes. 2.5 x 1.9 cm mass in the superior mediastinum between the brachiocephalic and left  common carotid arteries (series 4, image 19). The mass is separate from, but just below the thyroid gland, which is normal in appearance. The trachea and esophagus demonstrate no significant findings. Lungs/Pleura: No focal consolidation, pleural effusion, or pneumothorax. Mild paraseptal emphysema. Mild mosaic attenuation. Scattered areas of linear scarring throughout both lungs. Small calcified granuloma in the right lower lobe. No suspicious pulmonary nodule. Upper Abdomen: No acute abnormality. Subtle geographic hypodensity involving the right liver as compared to the left liver is likely perfusional. 2.0 cm enhancing lesion in the caudate lobe (series 4, image 81). Small bilateral renal cysts. Musculoskeletal: No chest wall abnormality. No acute or significant osseous findings. Review of the MIP images confirms the above findings. IMPRESSION: 1. No evidence of pulmonary embolism. No acute intrathoracic process. 2. Mosaic attenuation may reflect small airways disease. 3. Indeterminate 2.5 x 1.9 cm mass in the superior mediastinum. The mass is separate from, but just below the thyroid gland, which is normal in appearance. Differential considerations include lymphoma, thymoma, nodal metastasis, or accessory thyroid. Recommend further evaluation with outpatient thyroid ultrasound with attention on the infrathyroid region. Tissue sampling may ultimately be needed for definitive diagnosis. 4. Indeterminate 2.0 cm enhancing lesion in the caudate lobe of the liver. Recommend further evaluation with outpatient liver protocol MRI of the abdomen with and without contrast. 5. Few borderline enlarged bilateral axillary lymph nodes measuring up to 11 mm in short axis, nonspecific. 6. Emphysema (ICD10-J43.9). Electronically Signed   By: Titus Dubin M.D.   On: 04/29/2020 14:10   MR ABDOMEN W WO CONTRAST  Result Date: 04/30/2020 CLINICAL DATA:  Indeterminate liver lesion on recent chest CT. EXAM: MRI ABDOMEN WITHOUT AND  WITH CONTRAST TECHNIQUE: Multiplanar multisequence MR imaging of the abdomen was performed both before and after the administration of intravenous contrast. CONTRAST:  17mL GADAVIST GADOBUTROL 1 MMOL/ML IV SOLN COMPARISON:  Chest CTA on 04/29/2020 FINDINGS: Lower chest: No acute findings. Hepatobiliary: Image degradation by motion artifact noted particularly the dynamic postcontrast sequences. A lesion is seen in the caudate lobe which shows arterial phase hyperenhancement, and measures 1.8 cm on image 35/12. This is isointense with liver on delayed sequences and also shows T2 isointensity. This is most consistent with focal nodular hyperplasia. No other liver masses are identified. A 1.6 cm gallstone is seen. No evidence of cholecystitis or biliary ductal dilatation. Pancreas:  No mass or inflammatory changes. Spleen:  Within normal limits in size and appearance. Adrenals/Urinary Tract: Multiple tiny renal cysts are seen bilaterally. No masses identified. No evidence of hydronephrosis. Stomach/Bowel: Visualized portion unremarkable. Vascular/Lymphatic: No pathologically enlarged lymph nodes identified. No abdominal aortic aneurysm. Other:  None. Musculoskeletal:  No suspicious bone lesions identified. IMPRESSION: Image degradation by motion artifact noted. 1.8 cm lesion in the caudate lobe is most consistent with focal nodular hyperplasia. Recommend follow-up imaging in 6 months to confirm stability, preferably with abdomen CT with contrast due to motion artifact seen on today's exam. Cholelithiasis. No radiographic evidence of cholecystitis or biliary ductal dilatation. Electronically Signed   By: Marlaine Hind M.D.   On: 04/30/2020 14:09   DG Chest Portable 1 View  Result Date: 04/29/2020 CLINICAL DATA:  Shortness of breath, cough. EXAM: PORTABLE CHEST 1 VIEW COMPARISON:  April 05, 2020 FINDINGS: Increased basilar opacity at the RIGHT lung base compared to the prior imaging study. Similar appearance of basilar  opacities the LEFT lung base. Trachea is midline. Cardiomediastinal contours and hilar structures are normal. Subtle nodular density projectsa over the RIGHT upper chest, new compared to the prior study along with subtle opacity in the LEFT upper lobe as well. No overt signs of effusion. Question of scarring and or bronchiectasis along the RIGHT heart border. Visualized skeletal structures on limited assessment are unremarkable. IMPRESSION: 1. Increased basilar opacity at the RIGHT lung base compared to the prior imaging study, suspicious for developing pneumonia, perhaps multifocal given new nodular opacities, quite subtle in the upper lobes. Suggest follow-up to ensure resolution. 2. Lingular scarring and question of bronchiectasis along the RIGHT heart border raising the question of chronic component. Electronically Signed   By: Zetta Bills M.D.   On: 04/29/2020 09:57   US THYROID  Result Date: 04/30/2020 CLINICAL DATA:  Thyroid nodule EXAM: THYROID ULTRASOUND TECHNIQUE: Ultrasound examination of the thyroid gland and adjacent soft tissues was performed. COMPARISON:  04/29/2020 FINDINGS: Parenchymal Echotexture: Mildly heterogenous Isthmus: 0.9 cm Right lobe: 3.4 x 2.5 x 1.3 cm Left lobe: 3.7 x 1.9 x 1 4 cm _________________________________________________________ Estimated total number of nodules >/= 1 cm: 1 Number of spongiform nodules >/=  2 cm not described below (TR1): 0 Number of mixed cystic and solid nodules >/= 1.5 cm not described below ([place]): 0 _________________________________________________________ Nodule # 1: Location: Isthmus; Mid Maximum size: 1.8 cm; Other 2 dimensions: 1.8 x 1.1 cm Composition: solid/almost completely solid (2) Echogenicity: isoechoic (1) Shape: not taller-than-wide (0) Margins: ill-defined (0) Echogenic foci: none (0) ACR TI-RADS total points: 3. ACR TI-RADS risk category: TR3 (3 points). ACR TI-RADS recommendations: *Given size (>/= 1.5 - 2.4 cm) and appearance, a  follow-up ultrasound in 1 year should be considered based on TI-RADS criteria. _________________________________________________________ Inferior and separate from the thyroid extending into the superior mediastinum there is a round mildly heterogeneous hypoechoic solid mass measuring 3.1 x 2.3 x 2.3 cm. This does have  similar echogenicity to thyroid tissue and may represent accessory or ectopic thyroid. Additional considerations including lymphoma, thymoma, or nodal metastasis, all felt to be less likely. IMPRESSION: 1.8 cm mid isthmus TR 3 nodule meets criteria for follow-up in 1 year. 3.1 cm superior mediastinal indeterminate mass. See above comment. Lesion does appear to be approachable for ultrasound biopsy. The above is in keeping with the ACR TI-RADS recommendations - J Am Coll Radiol 2017;14:587-595. Electronically Signed   By: Jerilynn Mages.  Shick M.D.   On: 04/30/2020 15:44    Assessment and plan- Patient is a 49 y.o. female admitted for acute hypoxic respiratory failure possibly secondary to healthcare associated pneumonia incidentally found to have a superior mediastinal mass as well as caudate lobe liver lesion  1.  Caudate lobe liver lesion was consistent with focal nodular hyperplasia on MRI and I will plan to repeat his CT abdomen and pelvis with contrast in 6 months  2.  Superior mediastinal mass.  This appears to be distinct from her thyroid gland and etiology is unclear. She will need an ultrasound-guided biopsy which can be pursued as an inpatient or outpatient.  If she gets discharged over the weekend I will plan to arrange this as an outpatient and based on etiology decide about further management   Visit Diagnosis 1. Acute respiratory failure with hypoxia ([place])   2. Community acquired pneumonia, unspecified laterality   3. COPD with acute exacerbation ([place])   4. Nodule of anterior chest wall     Dr. Randa Evens, MD, MPH [facility] [phone] 04/30/2020 4:05 PM

## 2020-04-30 NOTE — Progress Notes (Signed)
1        [place] [facility] [place] NAME: Catherine Giles    MR#:  [id number]  DATE OF BIRTH:  [birth year]  SUBJECTIVE:  CHIEF COMPLAINT:   Chief Complaint  Patient presents with  . Shortness of Breath  . Back Pain  Complains of left sided back pain, shortness of breath, anxiety REVIEW OF SYSTEMS:  Review of Systems  Constitutional: Negative for diaphoresis, fever, malaise/fatigue and weight loss.  HENT: Negative for ear discharge, ear pain, hearing loss, nosebleeds, sore throat and tinnitus.   Eyes: Negative for blurred vision and pain.  Respiratory: Positive for shortness of breath and wheezing. Negative for cough and hemoptysis.   Cardiovascular: Negative for chest pain, palpitations, orthopnea and leg swelling.  Gastrointestinal: Negative for abdominal pain, blood in stool, constipation, diarrhea, heartburn, nausea and vomiting.  Genitourinary: Negative for dysuria, frequency and urgency.  Musculoskeletal: Positive for back pain. Negative for myalgias.  Skin: Negative for itching and rash.  Neurological: Negative for dizziness, tingling, tremors, focal weakness, seizures, weakness and headaches.  Psychiatric/Behavioral: Negative for depression. The patient is nervous/anxious.    DRUG ALLERGIES:   Allergies  Allergen Reactions  . Azithromycin Anaphylaxis  . Penicillins     Did it involve swelling of the face/tongue/throat, SOB, or low BP? Yes Did it involve sudden or severe rash/hives, skin peeling, or any reaction on the inside of your mouth or nose? No Did you need to seek medical attention at a hospital or doctor's office? No When did it last happen? If all above answers are "NO", may proceed with cephalosporin use.   . Shellfish Allergy    VITALS:  Blood pressure (!) 153/76, pulse (!) 107, temperature 98.5 F (36.9 C), temperature source Oral, resp. rate 20, height 5\' 3"  (1.6 m), weight 77.1 kg, SpO2 97 %. PHYSICAL EXAMINATION:  Physical  Exam HENT:     Head: Normocephalic and atraumatic.  Eyes:     Conjunctiva/sclera: Conjunctivae normal.     Pupils: Pupils are equal, round, and reactive to light.  Neck:     Thyroid: No thyromegaly.     Trachea: No tracheal deviation.  Cardiovascular:     Rate and Rhythm: Normal rate and regular rhythm.     Heart sounds: Normal heart sounds.  Pulmonary:     Effort: Pulmonary effort is normal. No respiratory distress.     Breath sounds: Examination of the right-lower field reveals wheezing and rhonchi. Examination of the left-lower field reveals wheezing and rhonchi. Wheezing and rhonchi present. No rales.  Chest:     Chest wall: No tenderness.  Abdominal:     General: Bowel sounds are normal. There is no distension.     Palpations: Abdomen is soft.     Tenderness: There is no abdominal tenderness.  Musculoskeletal:        General: Normal range of motion.     Cervical back: Normal range of motion and neck supple.  Skin:    General: Skin is warm and dry.     Findings: No rash.  Neurological:     Mental Status: She is alert and oriented to person, place, and time.     Cranial Nerves: No cranial nerve deficit.    LABORATORY PANEL:  Female CBC Recent Labs  Lab 04/30/20 0541  WBC 13.8*  HGB 13.3  HCT 38.9  PLT 333   ------------------------------------------------------------------------------------------------------------------ Chemistries  Recent Labs  Lab 04/29/20 0934 04/29/20 0934 04/30/20 0541  NA 142   < >  139  K 4.0   < > 3.9  CL 107   < > 107  CO2 23   < > 23  GLUCOSE 127*   < > 233*  BUN 12   < > 16  CREATININE 0.56   < > 0.54  CALCIUM 9.0   < > 8.9  AST 22  --   --   ALT 21  --   --   ALKPHOS 64  --   --   BILITOT 0.6  --   --    < > = values in this interval not displayed.   RADIOLOGY:  MR ABDOMEN W WO CONTRAST  Result Date: 04/30/2020 CLINICAL DATA:  Indeterminate liver lesion on recent chest CT. EXAM: MRI ABDOMEN WITHOUT AND WITH CONTRAST  TECHNIQUE: Multiplanar multisequence MR imaging of the abdomen was performed both before and after the administration of intravenous contrast. CONTRAST:  60mL GADAVIST GADOBUTROL 1 MMOL/ML IV SOLN COMPARISON:  Chest CTA on 04/29/2020 FINDINGS: Lower chest: No acute findings. Hepatobiliary: Image degradation by motion artifact noted particularly the dynamic postcontrast sequences. A lesion is seen in the caudate lobe which shows arterial phase hyperenhancement, and measures 1.8 cm on image 35/12. This is isointense with liver on delayed sequences and also shows T2 isointensity. This is most consistent with focal nodular hyperplasia. No other liver masses are identified. A 1.6 cm gallstone is seen. No evidence of cholecystitis or biliary ductal dilatation. Pancreas:  No mass or inflammatory changes. Spleen:  Within normal limits in size and appearance. Adrenals/Urinary Tract: Multiple tiny renal cysts are seen bilaterally. No masses identified. No evidence of hydronephrosis. Stomach/Bowel: Visualized portion unremarkable. Vascular/Lymphatic: No pathologically enlarged lymph nodes identified. No abdominal aortic aneurysm. Other:  None. Musculoskeletal:  No suspicious bone lesions identified. IMPRESSION: Image degradation by motion artifact noted. 1.8 cm lesion in the caudate lobe is most consistent with focal nodular hyperplasia. Recommend follow-up imaging in 6 months to confirm stability, preferably with abdomen CT with contrast due to motion artifact seen on today's exam. Cholelithiasis. No radiographic evidence of cholecystitis or biliary ductal dilatation. Electronically Signed   By: Marlaine Hind M.D.   On: 04/30/2020 14:09   US THYROID  Result Date: 04/30/2020 CLINICAL DATA:  Thyroid nodule EXAM: THYROID ULTRASOUND TECHNIQUE: Ultrasound examination of the thyroid gland and adjacent soft tissues was performed. COMPARISON:  04/29/2020 FINDINGS: Parenchymal Echotexture: Mildly heterogenous Isthmus: 0.9 cm Right  lobe: 3.4 x 2.5 x 1.3 cm Left lobe: 3.7 x 1.9 x 1 4 cm _________________________________________________________ Estimated total number of nodules >/= 1 cm: 1 Number of spongiform nodules >/=  2 cm not described below (TR1): 0 Number of mixed cystic and solid nodules >/= 1.5 cm not described below ([place]): 0 _________________________________________________________ Nodule # 1: Location: Isthmus; Mid Maximum size: 1.8 cm; Other 2 dimensions: 1.8 x 1.1 cm Composition: solid/almost completely solid (2) Echogenicity: isoechoic (1) Shape: not taller-than-wide (0) Margins: ill-defined (0) Echogenic foci: none (0) ACR TI-RADS total points: 3. ACR TI-RADS risk category: TR3 (3 points). ACR TI-RADS recommendations: *Given size (>/= 1.5 - 2.4 cm) and appearance, a follow-up ultrasound in 1 year should be considered based on TI-RADS criteria. _________________________________________________________ Inferior and separate from the thyroid extending into the superior mediastinum there is a round mildly heterogeneous hypoechoic solid mass measuring 3.1 x 2.3 x 2.3 cm. This does have similar echogenicity to thyroid tissue and may represent accessory or ectopic thyroid. Additional considerations including lymphoma, thymoma, or nodal metastasis, all felt to be less likely.  IMPRESSION: 1.8 cm mid isthmus TR 3 nodule meets criteria for follow-up in 1 year. 3.1 cm superior mediastinal indeterminate mass. See above comment. Lesion does appear to be approachable for ultrasound biopsy. The above is in keeping with the ACR TI-RADS recommendations - J Am Coll Radiol 2017;14:587-595. Electronically Signed   By: Jerilynn Mages.  Shick M.D.   On: 04/30/2020 15:44   ASSESSMENT AND PLAN:  49 year old female with a known history of COPD, lupus, arthritis, hypertension is admitted for community-acquired pneumonia and COPD exacerbation.  Community-acquired pneumonia Seen on chest x-ray and confirmed on CT chest Continue antibiotics, pulmonary  consultation  COPD exacerbation Continue inhaled and systemic steroids Pulmonary consultation.  Patient sees Dr. Raul Del as an outpatient will request evaluation by him  Superior mediastinal mass and thyroid nodule 3.1 cm Possible differentials include lymphoma, thymoma, accessory thyroid tissue, nodal metastasis We will set up outpatient follow-up with cancer center and [facility] endocrine for further evaluation Leukemia panel ordered, appreciate Dr. Janese Banks and Dr. Gust Brooms input  Hypothyroidism Continue Synthroid Check TSH  Morbid obesity Body mass index is 30.11 kg/m. Counseled for weight loss     Status is: Inpatient  Remains inpatient appropriate because:IV treatments appropriate due to intensity of illness or inability to take PO   Dispo: The patient is from: Home              Anticipated d/c is to: Home              Anticipated d/c date is: 2 days              Patient currently is not medically stable to d/c.    DVT prophylaxis:            enoxaparin (LOVENOX) injection 40 mg Start: 04/29/20 2200     Family Communication: Discussed with patient   All the records are reviewed and case discussed with Care Management/Social Worker. Management plans discussed with the patient, family and they are in agreement.  CODE STATUS: Full Code  TOTAL TIME TAKING CARE OF THIS PATIENT: 35 minutes.   More than 50% of the time was spent in counseling/coordination of care: YES  POSSIBLE D/C IN 1-2 DAYS, DEPENDING ON CLINICAL CONDITION.   Max Sane M.D on 04/30/2020 at 3:53 PM  [facility]   CC: Primary care physician; Guadalupe Maple, MD  Note: This dictation was prepared with Dragon dictation along with smaller phrase technology. Any transcriptional errors that result from this process are unintentional.

## 2020-04-30 NOTE — Progress Notes (Signed)
   04/30/20 1249  Assess: MEWS Score  Temp 98.7 F (37.1 C)  BP (!) 160/82  Pulse Rate (!) 108  Resp (!) 24  SpO2 96 %  O2 Device Nasal Cannula  O2 Flow Rate (L/min) 2 L/min  Assess: MEWS Score  MEWS Temp 0  MEWS Systolic 0  MEWS Pulse 1  MEWS RR 1  MEWS LOC 0  MEWS Score 2  MEWS Score Color Yellow  Assess: if the MEWS score is Yellow or Red  Were vital signs taken at a resting state? Yes  Focused Assessment Documented focused assessment  Early Detection of Sepsis Score *See Row Information* Medium  MEWS guidelines implemented *See Row Information* Yes  Treat  MEWS Interventions Administered prn meds/treatments (Dr. notified)  Take Vital Signs  Increase Vital Sign Frequency  Yellow: Q 2hr X 2 then Q 4hr X 2, if remains yellow, continue Q 4hrs  Escalate  MEWS: Escalate Yellow: discuss with charge nurse/RN and consider discussing with provider and RRT  Notify: Charge Nurse/RN  Name of Charge Nurse/RN Notified Christina, RN  Date Charge Nurse/RN Notified 04/30/20  Time Charge Nurse/RN Notified 1252  Notify: Provider  Provider Name/Title Dr. Manuella Ghazi  Date Provider Notified 04/30/20  Time Provider Notified 1253  Notification Type Rounds  Notification Reason Change in status  Response No new orders  Date of Provider Response 04/30/20  Time of Provider Response [id number]  Document  Patient Outcome Other (Comment) (continue to monitor)

## 2020-04-30 NOTE — Progress Notes (Addendum)
Initial Nutrition Assessment  DOCUMENTATION CODES:   Obesity unspecified  INTERVENTION:  Ensure Enlive po daily, each supplement provides 350 kcal and 20 grams of protein (strawberry) MVI with minerals daily  NUTRITION DIAGNOSIS:   Inadequate oral intake related to acute illness, chronic illness (CAP, COPD) as evidenced by per patient/family report (decreased po intake at home due to worsening SOB).    GOAL:   Patient will meet greater than or equal to 90% of their needs    MONITOR:   Labs, I & O's, Supplement acceptance, PO intake, Weight trends  REASON FOR ASSESSMENT:   Malnutrition Screening Tool    ASSESSMENT:  RD working remotely.   49 year old woman admitted for CAP with past medical history of COPD, emphysema, HTN, Lupus, thyroid disease presented with worsening SOB from baseline associated with cough productive of yellow sputum and wheezing.  RD able to speak with patient via phone. Reports ongoing shortness of breath, feels she needs to increase breathing treatments, states she has spoke with RN about concerns. She is not on supplemental oxygen at home, reports chronically SOB, PCP is trying to make arrangements for home O2. She reports improving appetite since admission, per flowsheets she consumed 100% of dinner last night. says po intake has been sporadic for the past 2 weeks, unable to prepare meals due to shortness of breath, recalls eating cups of soup and peanut butter and jelly sandwiches. RD educated on small frequent meals/snacks, recommended drinking nutrition supplement with poor meal intake. Patient would like to try strawberry Ensure, will order daily as well as MVI.   Patient endorses weight loss over the past 2 weeks, recalls usually weighing 170-175 lb. Per chart weights have been stable 166-171 lb over the past 5 months.  Medications reviewed and include: Gabapentin, Methylprednisolone IVPB: Levaquin  Labs: BG 233 (H) on steroid, WBC 13.8  (H)  NUTRITION - FOCUSED PHYSICAL EXAM: Unable to complete at this time,RD working remotely.  Diet Order:   Diet Order            Diet 2 gram sodium Room service appropriate? Yes; Fluid consistency: Thin  Diet effective now                 EDUCATION NEEDS:   Education needs have been addressed  Skin:  Skin Assessment: Reviewed RN Assessment  Last BM:  7/08  Height:   Ht Readings from Last 1 Encounters:  04/29/20 5\' 3"  (1.6 m)    Weight:   Wt Readings from Last 1 Encounters:  04/29/20 77.1 kg    Ideal Body Weight:  52.3 kg  BMI:  Body mass index is 30.11 kg/m.  Estimated Nutritional Needs:   Kcal:  1800-2000  Protein:  90-100  Fluid:  1.9 L   Lajuan Lines, RD, LDN Clinical Nutrition After Hours/Weekend Pager # in [place]

## 2020-04-30 NOTE — Consult Note (Signed)
Date: 04/30/2020,   MRN# [id number] Catherine Giles Feb 05, 1971 Code Status:     Code Status Orders  (From admission, onward)         Start     Ordered   04/29/20 1158  Full code  Continuous        04/29/20 1203        Code Status History    This patient has a current code status but no historical code status.   Advance Care Planning Activity     Hosp day:@LENGTHOFSTAYDAYS @ Referring MD: @ATDPROV @     PCP:      AdmissionWeight: 77.1 kg                 CurrentWeight: 77.1 kg   CC: sob and left posterior chest pain  HPI: This is a 49 year old lady, smoker presented with sob, left posterior chest pain, wheezing and coughing. See is well known to [place]. W/u was in progress. See [place] notes. Presently on oxygen, still smoking, c/o dyspnea mild wheezing and cough. Nom fever or hemoptysis, no calf pain. Sleepy. No headache or stiff neck, no hx of choking spells. Chart reviewed and xrays.    PMHX:   Past Medical History:  Diagnosis Date  . Arthritis    RA  . COPD (chronic obstructive pulmonary disease) ([place])   . Emphysema of lung ([place])   . Family history of breast cancer    6/21 cancer genetic testing letter sent  . Hypertension   . Lupus ([place])   . Thyroid disease    Surgical Hx:  Past Surgical History:  Procedure Laterality Date  . MOUTH SURGERY     Family Hx:  Family History  Problem Relation Age of Onset  . Lung cancer Mother   . Diabetes Mother   . Breast cancer Mother 70  . Hypertension Father   . Cancer Maternal Aunt   . Ovarian cancer Maternal Aunt   . Diabetes Maternal Aunt   . Diabetes Maternal Uncle   . Hypertension Maternal Grandmother   . Diabetes Maternal Grandmother    Social Hx:   Social History   Tobacco Use  . Smoking status: Current Every Day Smoker    Packs/day: 1.00    Years: 34.00    Pack years: 34.00    Types: Cigarettes  . Smokeless tobacco: Never Used  Vaping Use  . Vaping Use: Never used  Substance Use Topics  . Alcohol use: Yes     Comment: Occ  . Drug use: Not Currently   Medication:    Home Medication:    Current Medication: @CURMEDTAB @   Allergies:  Azithromycin, Penicillins, and Shellfish allergy  Review of Systems: Gen:  Denies  fever, sweats, chills HEENT: Denies blurred vision, double vision, ear pain, eye pain, hearing loss, nose bleeds, sore throat Cvc:  No dizziness, chest pain or heaviness Resp:    Gi: Denies swallowing difficulty, stomach pain, nausea or vomiting, diarrhea, constipation, bowel incontinence Gu:  Denies bladder incontinence, burning urine Ext:   No Joint pain, stiffness or swelling Skin: No skin rash, easy bruising or bleeding or hives Endoc:  No polyuria, polydipsia , polyphagia or weight change Psych: No depression, insomnia or hallucinations  Other:  All other systems negative  Physical Examination:   VS: BP (!) 160/82   Pulse (!) 108   Temp 98.7 F (37.1 C) (Oral)   Resp (!) 24   Ht 5\' 3"  (1.6 m)   Wt 77.1 kg   SpO2 96%  BMI 30.11 kg/m   General Appearance: No distress, sleeping off and on as we speak. Over weight  Neuro: without focal findings, mental status, speech normal, alert and oriented, cranial nerves 2-12 intact, reflexes normal and symmetric, sensation grossly normal  HEENT: PERRLA, EOM intact, no ptosis, no other lesions noticed,  Pulmonary:.No wheezing, No rales  Sputum Production:   Cardiovascular:  Normal S1,S2.  No m/r/g.  Abdominal aorta pulsation normal.    Abdomen:Benign, Soft, non-tender, No masses, hepatosplenomegaly, No lymphadenopathy Endoc: No evident thyromegaly, no signs of acromegaly or Cushing features Skin:   warm, no rashes, no ecchymosis  Extremities: normal, no cyanosis, clubbing, no edema, warm with normal capillary refill. Labs results:   Recent Labs    04/29/20 0934 04/30/20 0541  HGB 14.0 13.3  HCT 42.2 38.9  MCV 93.2 92.4  WBC 8.7 13.8*  BUN 12 16  CREATININE 0.56 0.54  GLUCOSE 127* 233*  CALCIUM 9.0 8.9  ,  Results  for Catherine, Giles (MRN [id number]) as of 04/30/2020 13:20  Ref. Range 04/05/2020 18:52 04/29/2020 09:34 04/29/2020 10:48 04/29/2020 11:06 04/30/2020 41:28  BASIC METABOLIC PANEL Unknown     Rpt (A)  COMPREHENSIVE METABOLIC PANEL Unknown  Rpt (A)     Sodium Latest Ref Range: 135 - 145 mmol/L  142   139  Potassium Latest Ref Range: 3.5 - 5.1 mmol/L  4.0   3.9  Chloride Latest Ref Range: 98 - 111 mmol/L  107   107  CO2 Latest Ref Range: 22 - 32 mmol/L  23   23  Glucose Latest Ref Range: 70 - 99 mg/dL  127 (H)   233 (H)  BUN Latest Ref Range: 6 - 20 mg/dL  12   16  Creatinine Latest Ref Range: 0.44 - 1.00 mg/dL  0.56   0.54  Calcium Latest Ref Range: 8.9 - 10.3 mg/dL  9.0   8.9  Anion gap Latest Ref Range: 5 - 15   12   9   Alkaline Phosphatase Latest Ref Range: 38 - 126 U/L  64     Albumin Latest Ref Range: 3.5 - 5.0 g/dL  3.5     AST Latest Ref Range: 15 - 41 U/L  22     ALT Latest Ref Range: 0 - 44 U/L  21     Total Protein Latest Ref Range: 6.5 - 8.1 g/dL  7.4     Total Bilirubin Latest Ref Range: 0.3 - 1.2 mg/dL  0.6     GFR, Est Non African American Latest Ref Range: >60 mL/min  >60   >60  GFR, Est African American Latest Ref Range: >60 mL/min  >60   >60    CLINICAL DATA:  Shortness of breath, cough.  EXAM: PORTABLE CHEST 1 VIEW  COMPARISON:  April 05, 2020  FINDINGS: Increased basilar opacity at the RIGHT lung base compared to the prior imaging study. Similar appearance of basilar opacities the LEFT lung base.  Trachea is midline. Cardiomediastinal contours and hilar structures are normal.  Subtle nodular density projectsa over the RIGHT upper chest, new compared to the prior study along with subtle opacity in the LEFT upper lobe as well. No overt signs of effusion.  Question of scarring and or bronchiectasis along the RIGHT heart border.  Visualized skeletal structures on limited assessment are unremarkable.  IMPRESSION: 1. Increased basilar opacity at the RIGHT lung  base compared to the prior imaging study, suspicious for developing pneumonia, perhaps multifocal given new nodular opacities, quite subtle in  the upper lobes. Suggest follow-up to ensure resolution. 2. Lingular scarring and question of bronchiectasis along the RIGHT heart border raising the question of chronic component.   Electronically Signed   By: Zetta Bills M.D.   On: 04/29/2020 09:57  IMPRESSION: 1. No evidence of pulmonary embolism. No acute intrathoracic process. 2. Mosaic attenuation may reflect small airways disease. 3. Indeterminate 2.5 x 1.9 cm mass in the superior mediastinum. The mass is separate from, but just below the thyroid gland, which is normal in appearance. Differential considerations include lymphoma, thymoma, nodal metastasis, or accessory thyroid. Recommend further evaluation with outpatient thyroid ultrasound with attention on the infrathyroid region. Tissue sampling may ultimately be needed for definitive diagnosis. 4. Indeterminate 2.0 cm enhancing lesion in the caudate lobe of the liver. Recommend further evaluation with outpatient liver protocol MRI of the abdomen with and without contrast. 5. Few borderline enlarged bilateral axillary lymph nodes measuring up to 11 mm in short axis, nonspecific. 6. Emphysema (ICD10-J43.9).  Electronically Signed   By: Titus Dubin M.D.   On: 04/29/2020 14:10  Echo pending Abdominal MRI pending   Thyroid Stimulating Hormone (TSH) 0.450-5.330 uIU/ml uIU/mL 3.897     Assessment and Plan:  Presents here with sob, left posterior pain, wheezing and coughing. Chest ct showed above showed no pulmonary embolism but mild mediastinal adenoapthy, anterior mediastinal mass and enhancing caudate focus, MRI pending  1 copd exacerbation/bronchitis. Right post pain ? Nm, ?? Pleurisy.? Early pneumonia -agree with present regimen and antibiotic -weight loss -echo   2. Anterior mediastinal mass 2.2 cm. D/dx   include lymphoma, thymoma, nodal metastasis, or accessory thyroid. -thyroid profile (done) -lymphoma screen -angiotensin converting enzyme -thyroid u/s -agree with out patient tissue bx ( will discuss with endo)  3. Caudate enhancing liver focus, ? hemingioma vs something omnuous  LFTS normal, NO ANEMIA -Liver MRI pending -further orders per above  4. Suspect OSA -Home sleep study pending   I have personally obtained a history, examined the patient, evaluated laboratory and imaging results, formulated the assessment and plan and placed orders.  The Patient requires high complexity decision making for assessment and support, frequent evaluation and titration of therapies, application of advanced monitoring technologies and extensive interpretation of multiple databases.   Ori Trejos,M.D. Pulmonary & Critical care Medicine [facility]

## 2020-05-01 DIAGNOSIS — E039 Hypothyroidism, unspecified: Secondary | ICD-10-CM

## 2020-05-01 LAB — CBC
HCT: 38 % (ref 36.0–46.0)
Hemoglobin: 12.4 g/dL (ref 12.0–15.0)
MCH: 31.1 pg (ref 26.0–34.0)
MCHC: 32.6 g/dL (ref 30.0–36.0)
MCV: 95.2 fL (ref 80.0–100.0)
Platelets: 338 10*3/uL (ref 150–400)
RBC: 3.99 MIL/uL (ref 3.87–5.11)
RDW: 14.4 % (ref 11.5–15.5)
WBC: 20.6 10*3/uL — ABNORMAL HIGH (ref 4.0–10.5)
nRBC: 0.1 % (ref 0.0–0.2)

## 2020-05-01 LAB — BASIC METABOLIC PANEL
Anion gap: 6 (ref 5–15)
BUN: 13 mg/dL (ref 6–20)
CO2: 26 mmol/L (ref 22–32)
Calcium: 8.8 mg/dL — ABNORMAL LOW (ref 8.9–10.3)
Chloride: 109 mmol/L (ref 98–111)
Creatinine, Ser: 0.48 mg/dL (ref 0.44–1.00)
GFR calc Af Amer: >60 mL/min (ref >60)
GFR calc non Af Amer: >60 mL/min (ref >60)
Glucose, Bld: 146 mg/dL — ABNORMAL HIGH (ref 70–99)
Potassium: 2.9 mmol/L — ABNORMAL LOW (ref 3.5–5.1)
Sodium: 141 mmol/L (ref 135–145)

## 2020-05-01 LAB — MAGNESIUM: Magnesium: 1.8 mg/dL (ref 1.7–2.4)

## 2020-05-01 LAB — ANGIOTENSIN CONVERTING ENZYME: Angiotensin-Converting Enzyme: 17 U/L (ref 14–82)

## 2020-05-01 MED ORDER — FUROSEMIDE 10 MG/ML IJ SOLN
40.0000 mg | Freq: Once | INTRAMUSCULAR | Status: AC
  Administered 2020-05-01: 40 mg via INTRAVENOUS
  Filled 2020-05-01: qty 4

## 2020-05-01 MED ORDER — IPRATROPIUM-ALBUTEROL 0.5-2.5 (3) MG/3ML IN SOLN
3.0000 mL | Freq: Four times a day (QID) | RESPIRATORY_TRACT | Status: DC
  Administered 2020-05-01: 3 mL via RESPIRATORY_TRACT
  Filled 2020-05-01: qty 3

## 2020-05-01 MED ORDER — POTASSIUM CHLORIDE CRYS ER 20 MEQ PO TBCR
40.0000 meq | EXTENDED_RELEASE_TABLET | Freq: Once | ORAL | Status: AC
  Administered 2020-05-01: 13:00:00 40 meq via ORAL
  Filled 2020-05-01: qty 2

## 2020-05-01 MED ORDER — IPRATROPIUM-ALBUTEROL 0.5-2.5 (3) MG/3ML IN SOLN
3.0000 mL | RESPIRATORY_TRACT | Status: DC
  Administered 2020-05-01 – 2020-05-02 (×5): 3 mL via RESPIRATORY_TRACT
  Filled 2020-05-01 (×7): qty 3

## 2020-05-01 MED ORDER — LEVOFLOXACIN 750 MG PO TABS
750.0000 mg | ORAL_TABLET | Freq: Every day | ORAL | Status: DC
  Administered 2020-05-01 – 2020-05-02 (×2): 750 mg via ORAL
  Filled 2020-05-01 (×2): qty 1

## 2020-05-01 NOTE — Progress Notes (Signed)
Date: 05/01/2020,   MRN# [id number] Mazel Villela [birth year] Code Status:     Code Status Orders  (From admission, onward)         Start     Ordered   04/29/20 1158  Full code  Continuous        04/29/20 1203        Code Status History    This patient has a current code status but no historical code status.   Advance Care Planning Activity     Hosp day:@LENGTHOFSTAYDAYS @ Referring MD: @ATDPROV @     PCP:   HPI: Much more coherent this am. Wheezing some, breathing better, less left post chest pain. No fever or hemoptysis. She is having some difficulty swallowing. Answered her questions.    PMHX:   Past Medical History:  Diagnosis Date  . Arthritis    RA  . COPD (chronic obstructive pulmonary disease) ([place])   . Emphysema of lung ([place])   . Family history of breast cancer    6/21 cancer genetic testing letter sent  . Hypertension   . Lupus ([place])   . Thyroid disease    Surgical Hx:  Past Surgical History:  Procedure Laterality Date  . MOUTH SURGERY     Family Hx:  Family History  Problem Relation Age of Onset  . Lung cancer Mother   . Diabetes Mother   . Breast cancer Mother 63  . Hypertension Father   . Cancer Maternal Aunt   . Ovarian cancer Maternal Aunt   . Diabetes Maternal Aunt   . Diabetes Maternal Uncle   . Hypertension Maternal Grandmother   . Diabetes Maternal Grandmother    Social Hx:   Social History   Tobacco Use  . Smoking status: Current Every Day Smoker    Packs/day: 1.00    Years: 34.00    Pack years: 34.00    Types: Cigarettes  . Smokeless tobacco: Never Used  Vaping Use  . Vaping Use: Never used  Substance Use Topics  . Alcohol use: Yes    Comment: Occ  . Drug use: Not Currently   Medication:    Home Medication:    Current Medication: @CURMEDTAB @   Allergies:  Azithromycin, Penicillins, and Shellfish allergy  Review of Systems: Gen:  Denies  fever, sweats, chills HEENT: Denies blurred vision, double vision, ear pain,  eye pain, hearing loss, nose bleeds, sore throat Cvc:  No dizziness, chest pain or heaviness Resp:  Breathing easier, less wheezing, occasional cough  Gi: Denies swallowing difficulty, stomach pain, nausea or vomiting, diarrhea, constipation, bowel incontinence Gu:  Denies bladder incontinence, burning urine Ext:   No Joint pain, stiffness or swelling Skin: No skin rash, easy bruising or bleeding or hives Endoc:  No polyuria, polydipsia , polyphagia or weight change Psych: No depression, insomnia or hallucinations  Other:  All other systems negative  Physical Examination:   VS: BP (!) 158/89 (BP Location: Left Arm)   Pulse 100   Temp 97.9 F (36.6 C) (Oral)   Resp 19   Ht 5\' 3"  (1.6 m)   Wt 77.1 kg   SpO2 94%   BMI 30.11 kg/m   General Appearance: No distress  Neuro: without focal findings, mental status, speech normal, alert and oriented, cranial nerves 2-12 intact, reflexes normal and symmetric, sensation grossly normal  HEENT: PERRLA, EOM intact, no ptosis, no other lesions noticed FundraisingSteps.no wheezing, No rales  No rub Cardiovascular:  Normal S1,S2.  No m/r/g.  Abdominal aorta pulsation normal.  Abdomen:Benign, Soft, non-tender, No masses, hepatosplenomegaly, No lymphadenopathy Endoc: No evident thyromegaly, no signs of acromegaly or Cushing features Skin:   warm, no rashes, no ecchymosis  Extremities: normal, no cyanosis, clubbing, no edema, warm with normal capillary refill. Other findings:   Labs results:   Recent Labs    04/29/20 0934 04/30/20 0541 05/01/20 0653  HGB 14.0 13.3 12.4  HCT 42.2 38.9 38.0  MCV 93.2 92.4 95.2  WBC 8.7 13.8* 20.6*  BUN 12 16 13   CREATININE 0.56 0.54 0.48  GLUCOSE 127* 233* 146*  CALCIUM 9.0 8.9 8.8*  ,    No results for input(s): PH in the last 72 hours.  Invalid input(s): PCO2, PO2, BASEEXCESS, BASEDEFICITE, TFT  Culture results:     Rad results:   MR ABDOMEN W WO CONTRAST  Result Date: 04/30/2020 CLINICAL DATA:   Indeterminate liver lesion on recent chest CT. EXAM: MRI ABDOMEN WITHOUT AND WITH CONTRAST TECHNIQUE: Multiplanar multisequence MR imaging of the abdomen was performed both before and after the administration of intravenous contrast. CONTRAST:  1mL GADAVIST GADOBUTROL 1 MMOL/ML IV SOLN COMPARISON:  Chest CTA on 04/29/2020 FINDINGS: Lower chest: No acute findings. Hepatobiliary: Image degradation by motion artifact noted particularly the dynamic postcontrast sequences. A lesion is seen in the caudate lobe which shows arterial phase hyperenhancement, and measures 1.8 cm on image 35/12. This is isointense with liver on delayed sequences and also shows T2 isointensity. This is most consistent with focal nodular hyperplasia. No other liver masses are identified. A 1.6 cm gallstone is seen. No evidence of cholecystitis or biliary ductal dilatation. Pancreas:  No mass or inflammatory changes. Spleen:  Within normal limits in size and appearance. Adrenals/Urinary Tract: Multiple tiny renal cysts are seen bilaterally. No masses identified. No evidence of hydronephrosis. Stomach/Bowel: Visualized portion unremarkable. Vascular/Lymphatic: No pathologically enlarged lymph nodes identified. No abdominal aortic aneurysm. Other:  None. Musculoskeletal:  No suspicious bone lesions identified. IMPRESSION: Image degradation by motion artifact noted. 1.8 cm lesion in the caudate lobe is most consistent with focal nodular hyperplasia. Recommend follow-up imaging in 6 months to confirm stability, preferably with abdomen CT with contrast due to motion artifact seen on today's exam. Cholelithiasis. No radiographic evidence of cholecystitis or biliary ductal dilatation. Electronically Signed   By: Marlaine Hind M.D.   On: 04/30/2020 14:09   ECHOCARDIOGRAM COMPLETE  Result Date: 04/30/2020    ECHOCARDIOGRAM REPORT   Patient Name:   CHRISTEN WARDROP Date of Exam: 04/30/2020 Medical Rec #:  [id number]     Height:       63.0 in Accession #:     [id number]    Weight:       170.0 lb Date of Birth:  [birth year]     BSA:          1.805 m Patient Age:    49 years      BP:           160/82 mmHg Patient Gender: F             HR:           108 bpm. Exam Location:  [facility] Procedure: 2D Echo, Cardiac Doppler and Color Doppler Indications:     Chest pain 786.50  History:         Patient has no prior history of Echocardiogram examinations.                  COPD; Risk Factors:Hypertension.  Sonographer:     Ludene Sport  RDCS (AE) Referring Phys:  [id number] LKJZPHXT AGBATA Diagnosing Phys: Ida Rogue MD IMPRESSIONS  1. Left ventricular ejection fraction, by estimation, is 60 to 65%. The left ventricle has normal function. The left ventricle has no regional wall motion abnormalities. Left ventricular diastolic parameters are consistent with Grade I diastolic dysfunction (impaired relaxation).  2. Right ventricular systolic function is normal. The right ventricular size is normal. There is normal pulmonary artery systolic pressure. FINDINGS  Left Ventricle: Left ventricular ejection fraction, by estimation, is 60 to 65%. The left ventricle has normal function. The left ventricle has no regional wall motion abnormalities. The left ventricular internal cavity size was normal in size. There is  no left ventricular hypertrophy. Left ventricular diastolic parameters are consistent with Grade I diastolic dysfunction (impaired relaxation). Right Ventricle: The right ventricular size is normal. No increase in right ventricular wall thickness. Right ventricular systolic function is normal. There is normal pulmonary artery systolic pressure. The tricuspid regurgitant velocity is 1.64 m/s, and  with an assumed right atrial pressure of 10 mmHg, the estimated right ventricular systolic pressure is 05.6 mmHg. Left Atrium: Left atrial size was normal in size. Right Atrium: Right atrial size was normal in size. Pericardium: There is no evidence of pericardial effusion. Mitral Valve: The  mitral valve is normal in structure. Normal mobility of the mitral valve leaflets. No evidence of mitral valve regurgitation. No evidence of mitral valve stenosis. Tricuspid Valve: The tricuspid valve is normal in structure. Tricuspid valve regurgitation is trivial. No evidence of tricuspid stenosis. Aortic Valve: The aortic valve was not well visualized. Aortic valve regurgitation is not visualized. No aortic stenosis is present. Aortic valve mean gradient measures 5.5 mmHg. Aortic valve peak gradient measures 8.1 mmHg. Aortic valve area, by VTI measures 3.47 cm. Pulmonic Valve: The pulmonic valve was normal in structure. Pulmonic valve regurgitation is not visualized. No evidence of pulmonic stenosis. Aorta: The aortic root is normal in size and structure. Venous: The inferior vena cava is normal in size with greater than 50% respiratory variability, suggesting right atrial pressure of 3 mmHg. IAS/Shunts: No atrial level shunt detected by color flow Doppler.  LEFT VENTRICLE PLAX 2D LVIDd:         4.65 cm  Diastology LVIDs:         2.28 cm  LV e' lateral:   7.62 cm/s LV PW:         1.29 cm  LV E/e' lateral: 10.7 LV IVS:        1.25 cm  LV e' medial:    6.53 cm/s LVOT diam:     2.30 cm  LV E/e' medial:  12.5 LV SV:         93 LV SV Index:   52 LVOT Area:     4.15 cm  RIGHT VENTRICLE RV Basal diam:  2.84 cm RV S prime:     12.90 cm/s TAPSE (M-mode): 2.6 cm LEFT ATRIUM             Index       RIGHT ATRIUM           Index LA diam:        3.30 cm 1.83 cm/m  RA Area:     16.00 cm LA Vol (A2C):   31.8 ml 17.62 ml/m RA Volume:   43.60 ml  24.16 ml/m LA Vol (A4C):   21.7 ml 12.02 ml/m LA Biplane Vol: 27.2 ml 15.07 ml/m  AORTIC VALVE  PULMONIC VALVE AV Area (Vmax):    3.63 cm     PV Vmax:        0.80 m/s AV Area (Vmean):   2.82 cm     PV Peak grad:   2.6 mmHg AV Area (VTI):     3.47 cm     RVOT Peak grad: 4 mmHg AV Vmax:           142.00 cm/s AV Vmean:          107.000 cm/s AV VTI:             0.268 m AV Peak Grad:      8.1 mmHg AV Mean Grad:      5.5 mmHg LVOT Vmax:         124.00 cm/s LVOT Vmean:        72.600 cm/s LVOT VTI:          0.224 m LVOT/AV VTI ratio: 0.84  AORTA Ao Root diam: 2.40 cm MITRAL VALVE                TRICUSPID VALVE MV Area (PHT): 6.54 cm     TR Peak grad:   10.8 mmHg MV Decel Time: 116 msec     TR Vmax:        164.00 cm/s MV E velocity: 81.50 cm/s MV A velocity: 129.00 cm/s  SHUNTS MV E/A ratio:  0.63         Systemic VTI:  0.22 m                             Systemic Diam: 2.30 cm Ida Rogue MD Electronically signed by Ida Rogue MD Signature Date/Time: 04/30/2020/4:30:45 PM    Final    US THYROID  Result Date: 04/30/2020 CLINICAL DATA:  Thyroid nodule EXAM: THYROID ULTRASOUND TECHNIQUE: Ultrasound examination of the thyroid gland and adjacent soft tissues was performed. COMPARISON:  04/29/2020 FINDINGS: Parenchymal Echotexture: Mildly heterogenous Isthmus: 0.9 cm Right lobe: 3.4 x 2.5 x 1.3 cm Left lobe: 3.7 x 1.9 x 1 4 cm _________________________________________________________ Estimated total number of nodules >/= 1 cm: 1 Number of spongiform nodules >/=  2 cm not described below (TR1): 0 Number of mixed cystic and solid nodules >/= 1.5 cm not described below ([place]): 0 _________________________________________________________ Nodule # 1: Location: Isthmus; Mid Maximum size: 1.8 cm; Other 2 dimensions: 1.8 x 1.1 cm Composition: solid/almost completely solid (2) Echogenicity: isoechoic (1) Shape: not taller-than-wide (0) Margins: ill-defined (0) Echogenic foci: none (0) ACR TI-RADS total points: 3. ACR TI-RADS risk category: TR3 (3 points). ACR TI-RADS recommendations: *Given size (>/= 1.5 - 2.4 cm) and appearance, a follow-up ultrasound in 1 year should be considered based on TI-RADS criteria. _________________________________________________________ Inferior and separate from the thyroid extending into the superior mediastinum there is a round mildly heterogeneous  hypoechoic solid mass measuring 3.1 x 2.3 x 2.3 cm. This does have similar echogenicity to thyroid tissue and may represent accessory or ectopic thyroid. Additional considerations including lymphoma, thymoma, or nodal metastasis, all felt to be less likely. IMPRESSION: 1.8 cm mid isthmus TR 3 nodule meets criteria for follow-up in 1 year. 3.1 cm superior mediastinal indeterminate mass. See above comment. Lesion does appear to be approachable for ultrasound biopsy. The above is in keeping with the ACR TI-RADS recommendations - J Am Coll Radiol 2017;14:587-595. Electronically Signed   By: Jerilynn Mages.  Shick M.D.   On: 04/30/2020 15:44  Assessment and Plan: 1 copd exacerbation/bronchitis. Right post pain ? Nm, ?? Pleurisy.? Early pneumonia. Overall today she is improving, possible d/c tomorrow -continue present regimen and antibiotic -weight loss -echo reults pending   2. Anterior mediastinal mass 2.2 cm. D/dx  include lymphoma, thymoma, nodal metastasis, or accessory thyroid. Appreciate oncology input -thyroid u/s -out patient tissue bx   3. Caudate enhancing liver focus, ? Etiology. beig followed by oncology -repeat liver ct with contrast in 6 months. -further orders per above  4. Suspect OSA -Home sleep study pending  5. Swallowing difficulty -out patient ba swallow    I have personally obtained a history, examined the patient, evaluated laboratory and imaging results, formulated the assessment and plan and placed orders.  The Patient requires high complexity decision making for assessment and support, frequent evaluation and titration of therapies, application of advanced monitoring technologies and extensive interpretation of multiple databases.   Etola Mull,M.D. Pulmonary & Critical care Medicine [facility]

## 2020-05-01 NOTE — Progress Notes (Signed)
VAST consult r/t restart PIV. Patient prefers not to get stuck rather takes po med. Primary RN will notify MD for further orders.

## 2020-05-01 NOTE — Progress Notes (Addendum)
1        Catherine Giles at [place] NAME: Catherine Giles    MR#:  [id number]  DATE OF BIRTH:  04/19/1971  SUBJECTIVE:  CHIEF COMPLAINT:   Chief Complaint  Patient presents with  . Shortness of Breath  . Back Pain  Having less pain in her back.  Feels swollen.  Requesting nebulizer every 4 hours.  Her shortness of breath has overall improved, potassium 2.9 REVIEW OF SYSTEMS:  Review of Systems  Constitutional: Negative for diaphoresis, fever, malaise/fatigue and weight loss.  HENT: Negative for ear discharge, ear pain, hearing loss, nosebleeds, sore throat and tinnitus.   Eyes: Negative for blurred vision and pain.  Respiratory: Positive for shortness of breath and wheezing. Negative for cough and hemoptysis.   Cardiovascular: Negative for chest pain, palpitations, orthopnea and leg swelling.  Gastrointestinal: Negative for abdominal pain, blood in stool, constipation, diarrhea, heartburn, nausea and vomiting.  Genitourinary: Negative for dysuria, frequency and urgency.  Musculoskeletal: Positive for back pain. Negative for myalgias.  Skin: Negative for itching and rash.  Neurological: Negative for dizziness, tingling, tremors, focal weakness, seizures, weakness and headaches.  Psychiatric/Behavioral: Negative for depression.   DRUG ALLERGIES:   Allergies  Allergen Reactions  . Azithromycin Anaphylaxis  . Penicillins     Did it involve swelling of the face/tongue/throat, SOB, or low BP? Yes Did it involve sudden or severe rash/hives, skin peeling, or any reaction on the inside of your mouth or nose? No Did you need to seek medical attention at a hospital or doctor's office? No When did it last happen? If all above answers are "NO", may proceed with cephalosporin use.   . Shellfish Allergy    VITALS:  Blood pressure (!) 156/94, pulse (!) 102, temperature 98.5 F (36.9 C), temperature source Oral, resp. rate 16, height 5\' 3"  (1.6 m), weight 77.1 kg,  SpO2 96 %. PHYSICAL EXAMINATION:  Physical Exam HENT:     Head: Normocephalic and atraumatic.  Eyes:     Conjunctiva/sclera: Conjunctivae normal.     Pupils: Pupils are equal, round, and reactive to light.  Neck:     Thyroid: No thyromegaly.     Trachea: No tracheal deviation.  Cardiovascular:     Rate and Rhythm: Normal rate and regular rhythm.     Heart sounds: Normal heart sounds.  Pulmonary:     Effort: Pulmonary effort is normal. No respiratory distress.     Breath sounds: Examination of the right-lower field reveals wheezing. Examination of the left-lower field reveals wheezing. Wheezing present. No rales.  Chest:     Chest wall: No tenderness.  Abdominal:     General: Bowel sounds are normal. There is no distension.     Palpations: Abdomen is soft.     Tenderness: There is no abdominal tenderness.  Musculoskeletal:        General: Normal range of motion.     Cervical back: Normal range of motion and neck supple.  Skin:    General: Skin is warm and dry.     Findings: No rash.  Neurological:     Mental Status: She is alert and oriented to person, place, and time.     Cranial Nerves: No cranial nerve deficit.    LABORATORY PANEL:  Female CBC Recent Labs  Lab 05/01/20 0653  WBC 20.6*  HGB 12.4  HCT 38.0  PLT 338   ------------------------------------------------------------------------------------------------------------------ Chemistries  Recent Labs  Lab 04/29/20 0934 04/30/20 0541 05/01/20 0653  NA  142   < > 141  K 4.0   < > 2.9*  CL 107   < > 109  CO2 23   < > 26  GLUCOSE 127*   < > 146*  BUN 12   < > 13  CREATININE 0.56   < > 0.48  CALCIUM 9.0   < > 8.8*  AST 22  --   --   ALT 21  --   --   ALKPHOS 64  --   --   BILITOT 0.6  --   --    < > = values in this interval not displayed.   RADIOLOGY:  ECHOCARDIOGRAM COMPLETE  Result Date: 04/30/2020    ECHOCARDIOGRAM REPORT   Patient Name:   Catherine Giles Date of Exam: 04/30/2020 Medical Rec #:   [id number]     Height:       63.0 in Accession #:    [id number]    Weight:       170.0 lb Date of Birth:  Jul 07, 1971     BSA:          1.805 m Patient Age:    49 years      BP:           160/82 mmHg Patient Gender: F             HR:           108 bpm. Exam Location:  [facility] Procedure: 2D Echo, Cardiac Doppler and Color Doppler Indications:     Chest pain 786.50  History:         Patient has no prior history of Echocardiogram examinations.                  COPD; Risk Factors:Hypertension.  Sonographer:     Draven Sport RDCS (AE) Referring Phys:  [id number] Collier Bullock Diagnosing Phys: Ida Rogue MD IMPRESSIONS  1. Left ventricular ejection fraction, by estimation, is 60 to 65%. The left ventricle has normal function. The left ventricle has no regional wall motion abnormalities. Left ventricular diastolic parameters are consistent with Grade I diastolic dysfunction (impaired relaxation).  2. Right ventricular systolic function is normal. The right ventricular size is normal. There is normal pulmonary artery systolic pressure. FINDINGS  Left Ventricle: Left ventricular ejection fraction, by estimation, is 60 to 65%. The left ventricle has normal function. The left ventricle has no regional wall motion abnormalities. The left ventricular internal cavity size was normal in size. There is  no left ventricular hypertrophy. Left ventricular diastolic parameters are consistent with Grade I diastolic dysfunction (impaired relaxation). Right Ventricle: The right ventricular size is normal. No increase in right ventricular wall thickness. Right ventricular systolic function is normal. There is normal pulmonary artery systolic pressure. The tricuspid regurgitant velocity is 1.64 m/s, and  with an assumed right atrial pressure of 10 mmHg, the estimated right ventricular systolic pressure is 27.0 mmHg. Left Atrium: Left atrial size was normal in size. Right Atrium: Right atrial size was normal in size. Pericardium: There is no  evidence of pericardial effusion. Mitral Valve: The mitral valve is normal in structure. Normal mobility of the mitral valve leaflets. No evidence of mitral valve regurgitation. No evidence of mitral valve stenosis. Tricuspid Valve: The tricuspid valve is normal in structure. Tricuspid valve regurgitation is trivial. No evidence of tricuspid stenosis. Aortic Valve: The aortic valve was not well visualized. Aortic valve regurgitation is not visualized. No aortic stenosis is present. Aortic valve mean gradient  measures 5.5 mmHg. Aortic valve peak gradient measures 8.1 mmHg. Aortic valve area, by VTI measures 3.47 cm. Pulmonic Valve: The pulmonic valve was normal in structure. Pulmonic valve regurgitation is not visualized. No evidence of pulmonic stenosis. Aorta: The aortic root is normal in size and structure. Venous: The inferior vena cava is normal in size with greater than 50% respiratory variability, suggesting right atrial pressure of 3 mmHg. IAS/Shunts: No atrial level shunt detected by color flow Doppler.  LEFT VENTRICLE PLAX 2D LVIDd:         4.65 cm  Diastology LVIDs:         2.28 cm  LV e' lateral:   7.62 cm/s LV PW:         1.29 cm  LV E/e' lateral: 10.7 LV IVS:        1.25 cm  LV e' medial:    6.53 cm/s LVOT diam:     2.30 cm  LV E/e' medial:  12.5 LV SV:         93 LV SV Index:   52 LVOT Area:     4.15 cm  RIGHT VENTRICLE RV Basal diam:  2.84 cm RV S prime:     12.90 cm/s TAPSE (M-mode): 2.6 cm LEFT ATRIUM             Index       RIGHT ATRIUM           Index LA diam:        3.30 cm 1.83 cm/m  RA Area:     16.00 cm LA Vol (A2C):   31.8 ml 17.62 ml/m RA Volume:   43.60 ml  24.16 ml/m LA Vol (A4C):   21.7 ml 12.02 ml/m LA Biplane Vol: 27.2 ml 15.07 ml/m  AORTIC VALVE                    PULMONIC VALVE AV Area (Vmax):    3.63 cm     PV Vmax:        0.80 m/s AV Area (Vmean):   2.82 cm     PV Peak grad:   2.6 mmHg AV Area (VTI):     3.47 cm     RVOT Peak grad: 4 mmHg AV Vmax:           142.00 cm/s AV  Vmean:          107.000 cm/s AV VTI:            0.268 m AV Peak Grad:      8.1 mmHg AV Mean Grad:      5.5 mmHg LVOT Vmax:         124.00 cm/s LVOT Vmean:        72.600 cm/s LVOT VTI:          0.224 m LVOT/AV VTI ratio: 0.84  AORTA Ao Root diam: 2.40 cm MITRAL VALVE                TRICUSPID VALVE MV Area (PHT): 6.54 cm     TR Peak grad:   10.8 mmHg MV Decel Time: 116 msec     TR Vmax:        164.00 cm/s MV E velocity: 81.50 cm/s MV A velocity: 129.00 cm/s  SHUNTS MV E/A ratio:  0.63         Systemic VTI:  0.22 m  Systemic Diam: 2.30 cm Ida Rogue MD Electronically signed by Ida Rogue MD Signature Date/Time: 04/30/2020/4:30:45 PM    Final    US THYROID  Result Date: 04/30/2020 CLINICAL DATA:  Thyroid nodule EXAM: THYROID ULTRASOUND TECHNIQUE: Ultrasound examination of the thyroid gland and adjacent soft tissues was performed. COMPARISON:  04/29/2020 FINDINGS: Parenchymal Echotexture: Mildly heterogenous Isthmus: 0.9 cm Right lobe: 3.4 x 2.5 x 1.3 cm Left lobe: 3.7 x 1.9 x 1 4 cm _________________________________________________________ Estimated total number of nodules >/= 1 cm: 1 Number of spongiform nodules >/=  2 cm not described below (TR1): 0 Number of mixed cystic and solid nodules >/= 1.5 cm not described below ([place]): 0 _________________________________________________________ Nodule # 1: Location: Isthmus; Mid Maximum size: 1.8 cm; Other 2 dimensions: 1.8 x 1.1 cm Composition: solid/almost completely solid (2) Echogenicity: isoechoic (1) Shape: not taller-than-wide (0) Margins: ill-defined (0) Echogenic foci: none (0) ACR TI-RADS total points: 3. ACR TI-RADS risk category: TR3 (3 points). ACR TI-RADS recommendations: *Given size (>/= 1.5 - 2.4 cm) and appearance, a follow-up ultrasound in 1 year should be considered based on TI-RADS criteria. _________________________________________________________ Inferior and separate from the thyroid extending into the superior  mediastinum there is a round mildly heterogeneous hypoechoic solid mass measuring 3.1 x 2.3 x 2.3 cm. This does have similar echogenicity to thyroid tissue and may represent accessory or ectopic thyroid. Additional considerations including lymphoma, thymoma, or nodal metastasis, all felt to be less likely. IMPRESSION: 1.8 cm mid isthmus TR 3 nodule meets criteria for follow-up in 1 year. 3.1 cm superior mediastinal indeterminate mass. See above comment. Lesion does appear to be approachable for ultrasound biopsy. The above is in keeping with the ACR TI-RADS recommendations - J Am Coll Radiol 2017;14:587-595. Electronically Signed   By: Jerilynn Mages.  Shick M.D.   On: 04/30/2020 15:44   ASSESSMENT AND PLAN:  49 year old female with a known history of COPD, lupus, arthritis, hypertension is admitted for community-acquired pneumonia and COPD exacerbation.  Community-acquired pneumonia Seen on chest x-ray and confirmed on CT chest Continue antibiotics, pulmonary input appreciated  COPD exacerbation Continue inhaled and systemic steroids Dr. Raul Del seen.  Nebs every 4 hours  Hypokalemia Potassium 2.9.  Replete and recheck, check magnesium  Superior mediastinal mass and thyroid nodule 3.1 cm Possible differentials include lymphoma, thymoma, accessory thyroid tissue, nodal metastasis outpatient follow-up with cancer center and [facility] endocrine for further evaluation Leukemia panel ordered, appreciate Dr. Janese Banks and Dr. Gust Brooms input  Hypothyroidism Continue Synthroid Normal TSH  Feeling swollen Likely from IV hydration as she was not eating properly We will give her 40 mg of Lasix once IV Echo within normal limit  Morbid obesity Body mass index is 30.11 kg/m. Counseled for weight loss     Status is: Inpatient  Remains inpatient appropriate because:IV treatments appropriate due to intensity of illness or inability to take PO   Dispo: The patient is from: Home              Anticipated d/c is to:  Home              Anticipated d/c date is: 1 day              Patient currently is not medically stable to d/c.    DVT prophylaxis:            enoxaparin (LOVENOX) injection 40 mg Start: 04/29/20 2200     Family Communication: Discussed with patient   All the records are reviewed and case discussed  with Care Management/Social Worker. Management plans discussed with the patient, nursing and they are in agreement.  CODE STATUS: Full Code  TOTAL TIME TAKING CARE OF THIS PATIENT: 35 minutes.   More than 50% of the time was spent in counseling/coordination of care: YES  POSSIBLE D/C IN 1 DAYS, DEPENDING ON CLINICAL CONDITION.   Max Sane M.D on 05/01/2020 at 12:13 PM  [facility]   CC: Primary care physician; Guadalupe Maple, MD  Note: This dictation was prepared with Dragon dictation along with smaller phrase technology. Any transcriptional errors that result from this process are unintentional.

## 2020-05-02 DIAGNOSIS — J9601 Acute respiratory failure with hypoxia: Secondary | ICD-10-CM

## 2020-05-02 LAB — POTASSIUM: Potassium: 3.5 mmol/L (ref 3.5–5.1)

## 2020-05-02 MED ORDER — PREDNISONE 10 MG (21) PO TBPK
ORAL_TABLET | ORAL | 0 refills | Status: DC
Start: 2020-05-02 — End: 2020-07-01

## 2020-05-02 MED ORDER — HYDROCOD POLST-CPM POLST ER 10-8 MG/5ML PO SUER: 5.0000 mL | Freq: Two times a day (BID) | ORAL | 0 refills | Status: DC | PRN

## 2020-05-02 MED ORDER — LEVOFLOXACIN 750 MG PO TABS: 750.0000 mg | ORAL_TABLET | Freq: Every day | ORAL | 0 refills | Status: DC

## 2020-05-02 MED ORDER — HYDROCOD POLST-CPM POLST ER 10-8 MG/5ML PO SUER: 5.0000 mL | Freq: Two times a day (BID) | ORAL | Status: DC | PRN

## 2020-05-03 ENCOUNTER — Ambulatory Visit: Payer: Medicare Other | Admitting: Dermatology

## 2020-05-03 ENCOUNTER — Encounter: Payer: Self-pay | Admitting: *Deleted

## 2020-05-03 DIAGNOSIS — J9859 Other diseases of mediastinum, not elsewhere classified: Secondary | ICD-10-CM

## 2020-05-03 NOTE — Progress Notes (Signed)
  Oncology Nurse Navigator Documentation  Navigator Location: [facility] (05/03/20 1600)   )Navigator Encounter Type: Appt/Treatment Plan Review (05/03/20 1600)   Abnormal Finding Date: 04/29/20 (05/03/20 1600)                   Treatment Phase: Abnormal Scans (05/03/20 1600) Barriers/Navigation Needs: Coordination of Care (05/03/20 1600)   Interventions: Coordination of Care (05/03/20 1600)   Coordination of Care: Appts (05/03/20 1600)       Per Dr. Janese Banks, pt needs US biopsy of the mediastinal mass. Assistance provided to coordinate appts for biopsy and schedule follow up with Dr. Tasia Catchings who has seen patient previously. Will contact pt with appts once scheduled.            Time Spent with Patient: 30 (05/03/20 1600)

## 2020-05-04 LAB — CULTURE, BLOOD (ROUTINE X 2)
Culture: NO GROWTH
Culture: NO GROWTH
Special Requests: ADEQUATE
Special Requests: ADEQUATE

## 2020-05-04 LAB — COMP PANEL: LEUKEMIA/LYMPHOMA

## 2020-05-04 NOTE — Discharge Summary (Signed)
[place] [facility] [place] NAME: Catherine Giles    MR#:  [id number]  DATE OF BIRTH:  [birth year]  DATE OF ADMISSION:  04/29/2020   ADMITTING PHYSICIAN: Collier Bullock, MD  DATE OF DISCHARGE: 05/02/2020 11:38 AM  PRIMARY CARE PHYSICIAN: Guadalupe Maple, MD   ADMISSION DIAGNOSIS:  CAP (community acquired pneumonia) [J18.9] Acute respiratory failure with hypoxia ([place]) [J96.01] COPD with acute exacerbation ([place]) [J44.1] Community acquired pneumonia, unspecified laterality [J18.9] DISCHARGE DIAGNOSIS:  Principal Problem:   CAP (community acquired pneumonia) Active Problems:   Rheumatoid arthritis involving multiple sites with positive rheumatoid factor ([place])   Essential hypertension   Hypothyroid   COPD with acute bronchitis ([place])   Acute respiratory failure with hypoxia ([place])  SECONDARY DIAGNOSIS:   Past Medical History:  Diagnosis Date  . Arthritis    RA  . COPD (chronic obstructive pulmonary disease) ([place])   . Emphysema of lung ([place])   . Family history of breast cancer    6/21 cancer genetic testing letter sent  . Hypertension   . Lupus ([place])   . Thyroid disease    HOSPITAL COURSE:  49 year old female with a known history of COPD, lupus, arthritis, hypertension admitted for community-acquired pneumonia and COPD exacerbation.  Community-acquired pneumonia present on admission Seen on chest x-ray and confirmed on CT chest.  Improved with antibiotics   COPD exacerbation Present on admission treated with nebs and steroids.  Seen by Dr. Raul Del while in the hospital follow-up outpatient with him  Hypokalemia Repleted and resolved  Superior mediastinal mass and thyroid nodule 3.1 cm Likely benign.  Outpatient follow-up with cancer center in case see endocrine for further evaluation.  Dr. Janese Banks is aware   DISCHARGE CONDITIONS:  Stable CONSULTS OBTAINED:  Treatment Team:  Sindy Guadeloupe, MD DRUG ALLERGIES:   Allergies  Allergen Reactions   . Azithromycin Anaphylaxis  . Penicillins     Did it involve swelling of the face/tongue/throat, SOB, or low BP? Yes Did it involve sudden or severe rash/hives, skin peeling, or any reaction on the inside of your mouth or nose? No Did you need to seek medical attention at a hospital or doctor's office? No When did it last happen? If all above answers are "NO", may proceed with cephalosporin use.   . Shellfish Allergy    DISCHARGE MEDICATIONS:   Allergies as of 05/02/2020      Reactions   Azithromycin Anaphylaxis   Penicillins    Did it involve swelling of the face/tongue/throat, SOB, or low BP? Yes Did it involve sudden or severe rash/hives, skin peeling, or any reaction on the inside of your mouth or nose? No Did you need to seek medical attention at a hospital or doctor's office? No When did it last happen? If all above answers are "NO", may proceed with cephalosporin use.   Shellfish Allergy       Medication List    STOP taking these medications   metroNIDAZOLE 500 MG tablet Commonly known as: FLAGYL     TAKE these medications   albuterol 108 (90 Base) MCG/ACT inhaler Commonly known as: VENTOLIN HFA Inhale 2 puffs into the lungs every 4 (four) hours as needed.   chlorpheniramine-HYDROcodone 10-8 MG/5ML Suer Commonly known as: TUSSIONEX Take 5 mLs by mouth every 12 (twelve) hours as needed for cough. What changed: reasons to take this   cyclobenzaprine 10 MG tablet Commonly known as: FLEXERIL Take 10 mg by mouth 3 (  three) times daily as needed for muscle spasms.   gabapentin 300 MG capsule Commonly known as: NEURONTIN Take 1 capsule (300 mg total) by mouth 3 (three) times daily.   ipratropium-albuterol 0.5-2.5 (3) MG/3ML Soln Commonly known as: DUONEB Take 3 mLs by nebulization every 6 (six) hours as needed.   levofloxacin 750 MG tablet Commonly known as: LEVAQUIN Take 1 tablet (750 mg total) by mouth daily.   levothyroxine 50 MCG  tablet Commonly known as: SYNTHROID Take 50 mcg by mouth daily before breakfast.   losartan 100 MG tablet Commonly known as: COZAAR Take 100 mg by mouth daily.   predniSONE 10 MG (21) Tbpk tablet Commonly known as: STERAPRED UNI-PAK 21 TAB Start 60 mg po daily, taper 10 mg daily until done   tiZANidine 4 MG tablet Commonly known as: ZANAFLEX Take 4 mg by mouth every 6 (six) hours as needed.   traZODone 50 MG tablet Commonly known as: DESYREL Take 1 tablet (50 mg total) by mouth at bedtime.   triamcinolone cream 0.1 % Commonly known as: KENALOG Apply 1 application topically daily as needed. Apply to affected areas twice daily as needed. Avoid Face,Groin and underarms   umeclidinium-vilanterol 62.5-25 MCG/INH Aepb Commonly known as: ANORO ELLIPTA Inhale 1 puff into the lungs daily in the afternoon.      DISCHARGE INSTRUCTIONS:   DIET:  Regular diet DISCHARGE CONDITION:  Stable ACTIVITY:  Activity as tolerated OXYGEN:  Home Oxygen: No.  Oxygen Delivery: room air DISCHARGE LOCATION:  home   If you experience worsening of your admission symptoms, develop shortness of breath, life threatening emergency, suicidal or homicidal thoughts you must seek medical attention immediately by calling 911 or calling your MD immediately  if symptoms less severe.  You Must read complete instructions/literature along with all the possible adverse reactions/side effects for all the Medicines you take and that have been prescribed to you. Take any new Medicines after you have completely understood and accpet all the possible adverse reactions/side effects.   Please note  You were cared for by a hospitalist during your hospital stay. If you have any questions about your discharge medications or the care you received while you were in the hospital after you are discharged, you can call the unit and asked to speak with the hospitalist on call if the hospitalist that took care of you is not  available. Once you are discharged, your primary care physician will handle any further medical issues. Please note that NO REFILLS for any discharge medications will be authorized once you are discharged, as it is imperative that you return to your primary care physician (or establish a relationship with a primary care physician if you do not have one) for your aftercare needs so that they can reassess your need for medications and monitor your lab values.    On the day of Discharge:  VITAL SIGNS:  Blood pressure (!) 144/90, pulse 95, temperature 97.8 F (36.6 C), temperature source Oral, resp. rate 14, height 5\' 3"  (1.6 m), weight 77.1 kg, SpO2 96 %. PHYSICAL EXAMINATION:  GENERAL:  49 y.o.-year-old patient lying in the bed with no acute distress.  EYES: Pupils equal, round, reactive to light and accommodation. No scleral icterus. Extraocular muscles intact.  HEENT: Head atraumatic, normocephalic. Oropharynx and nasopharynx clear.  NECK:  Supple, no jugular venous distention. No thyroid enlargement, no tenderness.  LUNGS: Normal breath sounds bilaterally, no wheezing, rales,rhonchi or crepitation. No use of accessory muscles of respiration.  CARDIOVASCULAR: S1, S2  normal. No murmurs, rubs, or gallops.  ABDOMEN: Soft, non-tender, non-distended. Bowel sounds present. No organomegaly or mass.  EXTREMITIES: No pedal edema, cyanosis, or clubbing.  NEUROLOGIC: Cranial nerves II through XII are intact. Muscle strength 5/5 in all extremities. Sensation intact. Gait not checked.  PSYCHIATRIC: The patient is alert and oriented x 3.  SKIN: No obvious rash, lesion, or ulcer.  DATA REVIEW:   CBC Recent Labs  Lab 05/01/20 0653  WBC 20.6*  HGB 12.4  HCT 38.0  PLT 338    Chemistries  Recent Labs  Lab 04/29/20 0934 04/30/20 0541 05/01/20 0653 05/01/20 0653 05/02/20 0759  NA 142   < > 141  --   --   K 4.0   < > 2.9*   < > 3.5  CL 107   < > 109  --   --   CO2 23   < > 26  --   --   GLUCOSE  127*   < > 146*  --   --   BUN 12   < > 13  --   --   CREATININE 0.56   < > 0.48  --   --   CALCIUM 9.0   < > 8.8*  --   --   MG  --   --  1.8  --   --   AST 22  --   --   --   --   ALT 21  --   --   --   --   ALKPHOS 64  --   --   --   --   BILITOT 0.6  --   --   --   --    < > = values in this interval not displayed.     Outpatient follow-up  Follow-up Information    Guadalupe Maple, MD. Schedule an appointment as soon as possible for a visit in 1 week(s).   Specialty: Family Medicine       Erby Pian, MD. Schedule an appointment as soon as possible for a visit in 2 week(s).   Specialty: Specialist Contact information: [place] [address] [phone]        Sindy Guadeloupe, MD. Schedule an appointment as soon as possible for a visit in 1 month(s).   Specialty: Oncology Contact information: [place] [address] [phone]                Management plans discussed with the patient, family and they are in agreement.  CODE STATUS: Prior   TOTAL TIME TAKING CARE OF THIS PATIENT: 45 minutes.    Max Sane M.D on 05/04/2020 at 2:24 PM  [facility]   CC: Primary care physician; Guadalupe Maple, MD   Note: This dictation was prepared with Dragon dictation along with smaller phrase technology. Any transcriptional errors that result from this process are unintentional.

## 2020-05-05 ENCOUNTER — Encounter: Payer: Self-pay | Admitting: *Deleted

## 2020-05-05 NOTE — Progress Notes (Signed)
  Oncology Nurse Navigator Documentation  Navigator Location: [facility] (05/05/20 0900)   )Navigator Encounter Type: Introductory Phone Call (05/05/20 0900)                         Barriers/Navigation Needs: Coordination of Care (05/05/20 0900)   Interventions: Coordination of Care (05/05/20 0900)   Coordination of Care: Appts (05/05/20 0900)           phone call made to patient to review upcoming appts and to introduce to navigator services. Pt informed of biopsy scheduled for 7/21 at 10:30am, arrival at 9:30am at the [facility]. Pt has scheduled follow up with Dr. Tasia Catchings on Friday 7/16 but per Dr. Tasia Catchings if pt feeling well and would like to move to after biopsy then okay to do so. Per pt, she prefers to move her appt on 7/16 to after her biopsy. Pt informed that appt on 7/16 will be rescheduled to 7/28 at 2:45pm. All questions answered during call. Contact info given and instructed to call with any further questions or needs. Pt verbalized understanding. Nothing further needed at this time.        Time Spent with Patient: 30 (05/05/20 0900)

## 2020-05-07 ENCOUNTER — Ambulatory Visit: Payer: Medicare Other | Admitting: Oncology

## 2020-05-11 ENCOUNTER — Inpatient Hospital Stay: Payer: Medicare Other | Admitting: Nurse Practitioner

## 2020-05-11 ENCOUNTER — Other Ambulatory Visit: Payer: Self-pay | Admitting: Radiology

## 2020-05-12 ENCOUNTER — Ambulatory Visit: Admission: RE | Admit: 2020-05-12 | Payer: Medicare Other | Source: Ambulatory Visit

## 2020-05-17 ENCOUNTER — Ambulatory Visit
Admission: RE | Admit: 2020-05-17 | Discharge: 2020-05-17 | Disposition: A | Discharge status: Home / Self Care | Payer: Medicare Other | Source: Ambulatory Visit | Attending: Oncology | Admitting: Oncology

## 2020-05-17 ENCOUNTER — Other Ambulatory Visit: Payer: Self-pay

## 2020-05-17 DIAGNOSIS — Z79899 Other long term (current) drug therapy: Secondary | ICD-10-CM | POA: Diagnosis not present

## 2020-05-17 DIAGNOSIS — Q892 Congenital malformations of other endocrine glands: Secondary | ICD-10-CM | POA: Insufficient documentation

## 2020-05-17 DIAGNOSIS — E079 Disorder of thyroid, unspecified: Secondary | ICD-10-CM | POA: Diagnosis not present

## 2020-05-17 DIAGNOSIS — J9859 Other diseases of mediastinum, not elsewhere classified: Secondary | ICD-10-CM

## 2020-05-17 DIAGNOSIS — F17210 Nicotine dependence, cigarettes, uncomplicated: Secondary | ICD-10-CM | POA: Insufficient documentation

## 2020-05-17 DIAGNOSIS — E118 Type 2 diabetes mellitus with unspecified complications: Secondary | ICD-10-CM | POA: Insufficient documentation

## 2020-05-17 DIAGNOSIS — J439 Emphysema, unspecified: Secondary | ICD-10-CM | POA: Diagnosis not present

## 2020-05-17 DIAGNOSIS — M069 Rheumatoid arthritis, unspecified: Secondary | ICD-10-CM | POA: Insufficient documentation

## 2020-05-17 DIAGNOSIS — M329 Systemic lupus erythematosus, unspecified: Secondary | ICD-10-CM | POA: Insufficient documentation

## 2020-05-17 DIAGNOSIS — Z7901 Long term (current) use of anticoagulants: Secondary | ICD-10-CM | POA: Insufficient documentation

## 2020-05-17 DIAGNOSIS — R222 Localized swelling, mass and lump, trunk: Secondary | ICD-10-CM | POA: Diagnosis not present

## 2020-05-17 DIAGNOSIS — I10 Essential (primary) hypertension: Secondary | ICD-10-CM | POA: Insufficient documentation

## 2020-05-17 HISTORY — DX: Prediabetes: R73.03

## 2020-05-17 LAB — APTT: aPTT: 25 seconds (ref 24–36)

## 2020-05-17 LAB — CBC
HCT: 41.4 % (ref 36.0–46.0)
Hemoglobin: 14.2 g/dL (ref 12.0–15.0)
MCH: 31.7 pg (ref 26.0–34.0)
MCHC: 34.3 g/dL (ref 30.0–36.0)
MCV: 92.4 fL (ref 80.0–100.0)
Platelets: 383 10*3/uL (ref 150–400)
RBC: 4.48 MIL/uL (ref 3.87–5.11)
RDW: 14.7 % (ref 11.5–15.5)
WBC: 13 10*3/uL — ABNORMAL HIGH (ref 4.0–10.5)
nRBC: 0.3 % — ABNORMAL HIGH (ref 0.0–0.2)

## 2020-05-17 LAB — PROTIME-INR
INR: 1 (ref 0.8–1.2)
Prothrombin Time: 12.6 seconds (ref 11.4–15.2)

## 2020-05-17 MED ORDER — OXYCODONE-ACETAMINOPHEN 5-325 MG PO TABS
ORAL_TABLET | ORAL | Status: AC
  Filled 2020-05-17: qty 1

## 2020-05-17 MED ORDER — OXYCODONE-ACETAMINOPHEN 5-325 MG PO TABS
1.0000 | ORAL_TABLET | Freq: Once | ORAL | Status: AC
  Administered 2020-05-17: 1 via ORAL

## 2020-05-17 MED ORDER — MIDAZOLAM HCL 5 MG/5ML IJ SOLN
INTRAMUSCULAR | Status: AC
  Filled 2020-05-17: qty 5

## 2020-05-17 MED ORDER — SODIUM CHLORIDE 0.9 % IV SOLN: INTRAVENOUS | Status: DC

## 2020-05-17 MED ORDER — HYDROCODONE-ACETAMINOPHEN 5-325 MG PO TABS: 1.0000 | ORAL_TABLET | Freq: Four times a day (QID) | ORAL | 0 refills | Status: DC | PRN

## 2020-05-17 MED ORDER — MIDAZOLAM HCL 5 MG/5ML IJ SOLN
INTRAMUSCULAR | Status: AC | PRN
  Administered 2020-05-17: 2 mg via INTRAVENOUS

## 2020-05-17 MED ORDER — FENTANYL CITRATE (PF) 100 MCG/2ML IJ SOLN
INTRAMUSCULAR | Status: AC | PRN
  Administered 2020-05-17: 50 ug via INTRAVENOUS

## 2020-05-17 MED ORDER — FENTANYL CITRATE (PF) 100 MCG/2ML IJ SOLN
INTRAMUSCULAR | Status: AC
  Filled 2020-05-17: qty 2

## 2020-05-17 NOTE — H&P (Signed)
Chief Complaint: Patient was seen in consultation today for mediastinal mass  Referring Physician(s): Yu,Zhou  Supervising Physician: Daryll Brod  Patient Status: [facility] - Out-pt  History of Present Illness: Catherine Giles is a 49 y.o. female with past medical history of arthritis, COPD, emphysema, HTN, lupus, DM, and self reports a history of fibromyalgia and substance today, who presents with recent incidental finding of a mediastinal mass during recent hospitalization for shortness of breath.  She is referred to [facility]iology for biopsy of the mass.   Catherine Giles presents today exhibiting anxiety.  She reports this procedure has been attempted 3 times but she was unable to tolerate the numbing injection and the procedure was terminated.  She has been set up for procedure with sedation today.  After discussion of procedure and process with sedation medicine, she is willing to attempt procedure.  She has been NPO.  She does not take blood thinners.   Past Medical History:  Diagnosis Date  . Arthritis    RA  . COPD (chronic obstructive pulmonary disease) ([place])   . Emphysema of lung ([place])   . Family history of breast cancer    6/21 cancer genetic testing letter sent  . Hypertension   . Lupus ([place])   . Pre-diabetes   . Thyroid disease     Past Surgical History:  Procedure Laterality Date  . MOUTH SURGERY      Allergies: Azithromycin, Penicillins, and Shellfish allergy  Medications: Prior to Admission medications   Medication Sig Start Date End Date Taking? Authorizing Provider  albuterol (VENTOLIN HFA) 108 (90 Base) MCG/ACT inhaler Inhale 2 puffs into the lungs every 4 (four) hours as needed.  02/12/19 05/17/20 Yes [provider]  cyclobenzaprine (FLEXERIL) 10 MG tablet Take 10 mg by mouth 3 (three) times daily as needed for muscle spasms.    Yes [provider]  gabapentin (NEURONTIN) 300 MG capsule Take 1 capsule (300 mg total) by mouth 3 (three) times  daily. 12/16/19  Yes Cannady, Jolene T, NP  ipratropium-albuterol (DUONEB) 0.5-2.5 (3) MG/3ML SOLN Take 3 mLs by nebulization every 6 (six) hours as needed. 12/02/19  Yes Karen Kitchens, NP  levofloxacin (LEVAQUIN) 750 MG tablet Take 1 tablet (750 mg total) by mouth daily. 05/03/20  Yes Max Sane, MD  levothyroxine (SYNTHROID) 50 MCG tablet Take 50 mcg by mouth daily before breakfast.    Yes [provider]  losartan (COZAAR) 100 MG tablet Take 100 mg by mouth daily.    Yes [provider]  predniSONE (STERAPRED UNI-PAK 21 TAB) 10 MG (21) TBPK tablet Start 60 mg po daily, taper 10 mg daily until done 05/02/20  Yes Max Sane, MD  tiZANidine (ZANAFLEX) 4 MG tablet Take 4 mg by mouth every 6 (six) hours as needed.    Yes [provider]  traZODone (DESYREL) 50 MG tablet Take 1 tablet (50 mg total) by mouth at bedtime. 01/05/20  Yes Cannady, Jolene T, NP  triamcinolone cream (KENALOG) 0.1 % Apply 1 application topically daily as needed. Apply to affected areas twice daily as needed. Avoid Face,Groin and underarms 03/23/20  Yes Brendolyn Patty, MD  umeclidinium-vilanterol [facility] ELLIPTA) 62.5-25 MCG/INH AEPB Inhale 1 puff into the lungs daily in the afternoon.  02/12/19  Yes [provider]  chlorpheniramine-HYDROcodone (TUSSIONEX) 10-8 MG/5ML SUER Take 5 mLs by mouth every 12 (twelve) hours as needed for cough. Patient not taking: Reported on 05/17/2020 05/02/20   Max Sane, MD     [facility]  History  Problem Relation Age of Onset  . Lung cancer Mother   . Diabetes Mother   . Breast cancer Mother 51  . Hypertension Father   . Cancer Maternal Aunt   . Ovarian cancer Maternal Aunt   . Diabetes Maternal Aunt   . Diabetes Maternal Uncle   . Hypertension Maternal Grandmother   . Diabetes Maternal Grandmother     Social History   Socioeconomic History  . Marital status: Divorced    Spouse name: Not on file  . Number of children: Not on file  . Years of education: Not  on file  . Highest education level: Not on file  Occupational History  . Not on file  Tobacco Use  . Smoking status: Current Every Day Smoker    Packs/day: 1.00    Years: 34.00    Pack years: 34.00    Types: Cigarettes  . Smokeless tobacco: Never Used  Vaping Use  . Vaping Use: Never used  Substance and Sexual Activity  . Alcohol use: Yes    Comment: Occ  . Drug use: Not Currently  . Sexual activity: Yes  Other Topics Concern  . Not on file  Social History Narrative  . Not on file   Social Determinants of Health   Financial Resource Strain:   . Difficulty of Paying Living Expenses:   Food Insecurity:   . Worried About Charity fundraiser in the Last Year:   . Arboriculturist in the Last Year:   Transportation Needs:   . Film/video editor (Medical):   Marland Kitchen Lack of Transportation (Non-Medical):   Physical Activity:   . Days of Exercise per Week:   . Minutes of Exercise per Session:   Stress:   . Feeling of Stress :   Social Connections:   . Frequency of Communication with Friends and Family:   . Frequency of Social Gatherings with Friends and Family:   . Attends Religious Services:   . Active Member of Clubs or Organizations:   . Attends Archivist Meetings:   Marland Kitchen Marital Status:      Review of Systems: A 12 point ROS discussed and pertinent positives are indicated in the HPI above.  All other systems are negative.  Review of Systems  Constitutional: Negative for fatigue and fever.  HENT: Positive for trouble swallowing (reports painful swallow).   Respiratory: Negative for cough and shortness of breath.   Cardiovascular: Negative for chest pain.  Gastrointestinal: Negative for abdominal pain.  Musculoskeletal: Negative for back pain.  Psychiatric/Behavioral: Negative for behavioral problems and confusion.    Vital Signs: BP (!) 139/91   Pulse 93   Temp 98.3 F (36.8 C) (Oral)   Resp 16   SpO2 97%   Physical Exam Vitals and nursing note  reviewed.  Constitutional:      Appearance: Normal appearance.  HENT:     Mouth/Throat:     Mouth: Mucous membranes are moist.     Pharynx: Oropharynx is clear.  Cardiovascular:     Rate and Rhythm: Normal rate and regular rhythm.  Pulmonary:     Effort: Pulmonary effort is normal. No respiratory distress.     Breath sounds: Normal breath sounds.  Abdominal:     General: Abdomen is flat.     Palpations: Abdomen is soft.  Skin:    General: Skin is warm and dry.  Neurological:     General: No focal deficit present.     Mental Status:  She is alert and oriented to person, place, and time. Mental status is at baseline.  Psychiatric:        Mood and Affect: Mood normal.        Behavior: Behavior normal.        Thought Content: Thought content normal.        Judgment: Judgment normal.      MD Evaluation Airway: WNL Heart: WNL Abdomen: WNL Chest/ Lungs: WNL ASA  Classification: 3 Mallampati/Airway Score: Two   Imaging: CT Angio Chest PE W and/or Wo Contrast  Result Date: 04/29/2020 CLINICAL DATA:  Shortness of breath and pleuritic chest pain with productive cough. EXAM: CT ANGIOGRAPHY CHEST WITH CONTRAST TECHNIQUE: Multidetector CT imaging of the chest was performed using the standard protocol during bolus administration of intravenous contrast. Multiplanar CT image reconstructions and MIPs were obtained to evaluate the vascular anatomy. CONTRAST:  29mL OMNIPAQUE IOHEXOL 350 MG/ML SOLN COMPARISON:  Chest x-ray from same day. FINDINGS: Cardiovascular: Satisfactory opacification of the pulmonary arteries to the segmental level. No evidence of pulmonary embolism. Normal heart size. No pericardial effusion. No thoracic aortic aneurysm or dissection. Mediastinum/Nodes: There are a few borderline enlarged bilateral axillary lymph nodes measuring up to 11 mm in short axis (series 4, image 6). No enlarged mediastinal or hilar lymph nodes. 2.5 x 1.9 cm mass in the superior mediastinum between  the brachiocephalic and left common carotid arteries (series 4, image 19). The mass is separate from, but just below the thyroid gland, which is normal in appearance. The trachea and esophagus demonstrate no significant findings. Lungs/Pleura: No focal consolidation, pleural effusion, or pneumothorax. Mild paraseptal emphysema. Mild mosaic attenuation. Scattered areas of linear scarring throughout both lungs. Small calcified granuloma in the right lower lobe. No suspicious pulmonary nodule. Upper Abdomen: No acute abnormality. Subtle geographic hypodensity involving the right liver as compared to the left liver is likely perfusional. 2.0 cm enhancing lesion in the caudate lobe (series 4, image 81). Small bilateral renal cysts. Musculoskeletal: No chest wall abnormality. No acute or significant osseous findings. Review of the MIP images confirms the above findings. IMPRESSION: 1. No evidence of pulmonary embolism. No acute intrathoracic process. 2. Mosaic attenuation may reflect small airways disease. 3. Indeterminate 2.5 x 1.9 cm mass in the superior mediastinum. The mass is separate from, but just below the thyroid gland, which is normal in appearance. Differential considerations include lymphoma, thymoma, nodal metastasis, or accessory thyroid. Recommend further evaluation with outpatient thyroid ultrasound with attention on the infrathyroid region. Tissue sampling may ultimately be needed for definitive diagnosis. 4. Indeterminate 2.0 cm enhancing lesion in the caudate lobe of the liver. Recommend further evaluation with outpatient liver protocol MRI of the abdomen with and without contrast. 5. Few borderline enlarged bilateral axillary lymph nodes measuring up to 11 mm in short axis, nonspecific. 6. Emphysema (ICD10-J43.9). Electronically Signed   By: Titus Dubin M.D.   On: 04/29/2020 14:10   MR ABDOMEN W WO CONTRAST  Result Date: 04/30/2020 CLINICAL DATA:  Indeterminate liver lesion on recent chest CT.  EXAM: MRI ABDOMEN WITHOUT AND WITH CONTRAST TECHNIQUE: Multiplanar multisequence MR imaging of the abdomen was performed both before and after the administration of intravenous contrast. CONTRAST:  65mL GADAVIST GADOBUTROL 1 MMOL/ML IV SOLN COMPARISON:  Chest CTA on 04/29/2020 FINDINGS: Lower chest: No acute findings. Hepatobiliary: Image degradation by motion artifact noted particularly the dynamic postcontrast sequences. A lesion is seen in the caudate lobe which shows arterial phase hyperenhancement, and measures 1.8 cm on  image 35/12. This is isointense with liver on delayed sequences and also shows T2 isointensity. This is most consistent with focal nodular hyperplasia. No other liver masses are identified. A 1.6 cm gallstone is seen. No evidence of cholecystitis or biliary ductal dilatation. Pancreas:  No mass or inflammatory changes. Spleen:  Within normal limits in size and appearance. Adrenals/Urinary Tract: Multiple tiny renal cysts are seen bilaterally. No masses identified. No evidence of hydronephrosis. Stomach/Bowel: Visualized portion unremarkable. Vascular/Lymphatic: No pathologically enlarged lymph nodes identified. No abdominal aortic aneurysm. Other:  None. Musculoskeletal:  No suspicious bone lesions identified. IMPRESSION: Image degradation by motion artifact noted. 1.8 cm lesion in the caudate lobe is most consistent with focal nodular hyperplasia. Recommend follow-up imaging in 6 months to confirm stability, preferably with abdomen CT with contrast due to motion artifact seen on today's exam. Cholelithiasis. No radiographic evidence of cholecystitis or biliary ductal dilatation. Electronically Signed   By: Marlaine Hind M.D.   On: 04/30/2020 14:09   DG Chest Portable 1 View  Result Date: 04/29/2020 CLINICAL DATA:  Shortness of breath, cough. EXAM: PORTABLE CHEST 1 VIEW COMPARISON:  April 05, 2020 FINDINGS: Increased basilar opacity at the RIGHT lung base compared to the prior imaging study.  Similar appearance of basilar opacities the LEFT lung base. Trachea is midline. Cardiomediastinal contours and hilar structures are normal. Subtle nodular density projectsa over the RIGHT upper chest, new compared to the prior study along with subtle opacity in the LEFT upper lobe as well. No overt signs of effusion. Question of scarring and or bronchiectasis along the RIGHT heart border. Visualized skeletal structures on limited assessment are unremarkable. IMPRESSION: 1. Increased basilar opacity at the RIGHT lung base compared to the prior imaging study, suspicious for developing pneumonia, perhaps multifocal given new nodular opacities, quite subtle in the upper lobes. Suggest follow-up to ensure resolution. 2. Lingular scarring and question of bronchiectasis along the RIGHT heart border raising the question of chronic component. Electronically Signed   By: Zetta Bills M.D.   On: 04/29/2020 09:57   ECHOCARDIOGRAM COMPLETE  Result Date: 04/30/2020    ECHOCARDIOGRAM REPORT   Patient Name:   Catherine Giles Date of Exam: 04/30/2020 Medical Rec #:  [id number]     Height:       63.0 in Accession #:    [id number]    Weight:       170.0 lb Date of Birth:  10/27/1970     BSA:          1.805 m Patient Age:    62 years      BP:           160/82 mmHg Patient Gender: F             HR:           108 bpm. Exam Location:  [facility] Procedure: 2D Echo, Cardiac Doppler and Color Doppler Indications:     Chest pain 786.50  History:         Patient has no prior history of Echocardiogram examinations.                  COPD; Risk Factors:Hypertension.  Sonographer:     Mikelle Sport RDCS (AE) Referring Phys:  [id number] Collier Bullock Diagnosing Phys: Ida Rogue MD IMPRESSIONS  1. Left ventricular ejection fraction, by estimation, is 60 to 65%. The left ventricle has normal function. The left ventricle has no regional wall motion abnormalities. Left ventricular diastolic parameters are consistent with Grade  I diastolic dysfunction  (impaired relaxation).  2. Right ventricular systolic function is normal. The right ventricular size is normal. There is normal pulmonary artery systolic pressure. FINDINGS  Left Ventricle: Left ventricular ejection fraction, by estimation, is 60 to 65%. The left ventricle has normal function. The left ventricle has no regional wall motion abnormalities. The left ventricular internal cavity size was normal in size. There is  no left ventricular hypertrophy. Left ventricular diastolic parameters are consistent with Grade I diastolic dysfunction (impaired relaxation). Right Ventricle: The right ventricular size is normal. No increase in right ventricular wall thickness. Right ventricular systolic function is normal. There is normal pulmonary artery systolic pressure. The tricuspid regurgitant velocity is 1.64 m/s, and  with an assumed right atrial pressure of 10 mmHg, the estimated right ventricular systolic pressure is 16.1 mmHg. Left Atrium: Left atrial size was normal in size. Right Atrium: Right atrial size was normal in size. Pericardium: There is no evidence of pericardial effusion. Mitral Valve: The mitral valve is normal in structure. Normal mobility of the mitral valve leaflets. No evidence of mitral valve regurgitation. No evidence of mitral valve stenosis. Tricuspid Valve: The tricuspid valve is normal in structure. Tricuspid valve regurgitation is trivial. No evidence of tricuspid stenosis. Aortic Valve: The aortic valve was not well visualized. Aortic valve regurgitation is not visualized. No aortic stenosis is present. Aortic valve mean gradient measures 5.5 mmHg. Aortic valve peak gradient measures 8.1 mmHg. Aortic valve area, by VTI measures 3.47 cm. Pulmonic Valve: The pulmonic valve was normal in structure. Pulmonic valve regurgitation is not visualized. No evidence of pulmonic stenosis. Aorta: The aortic root is normal in size and structure. Venous: The inferior vena cava is normal in size with  greater than 50% respiratory variability, suggesting right atrial pressure of 3 mmHg. IAS/Shunts: No atrial level shunt detected by color flow Doppler.  LEFT VENTRICLE PLAX 2D LVIDd:         4.65 cm  Diastology LVIDs:         2.28 cm  LV e' lateral:   7.62 cm/s LV PW:         1.29 cm  LV E/e' lateral: 10.7 LV IVS:        1.25 cm  LV e' medial:    6.53 cm/s LVOT diam:     2.30 cm  LV E/e' medial:  12.5 LV SV:         93 LV SV Index:   52 LVOT Area:     4.15 cm  RIGHT VENTRICLE RV Basal diam:  2.84 cm RV S prime:     12.90 cm/s TAPSE (M-mode): 2.6 cm LEFT ATRIUM             Index       RIGHT ATRIUM           Index LA diam:        3.30 cm 1.83 cm/m  RA Area:     16.00 cm LA Vol (A2C):   31.8 ml 17.62 ml/m RA Volume:   43.60 ml  24.16 ml/m LA Vol (A4C):   21.7 ml 12.02 ml/m LA Biplane Vol: 27.2 ml 15.07 ml/m  AORTIC VALVE                    PULMONIC VALVE AV Area (Vmax):    3.63 cm     PV Vmax:        0.80 m/s AV Area (Vmean):   2.82 cm  PV Peak grad:   2.6 mmHg AV Area (VTI):     3.47 cm     RVOT Peak grad: 4 mmHg AV Vmax:           142.00 cm/s AV Vmean:          107.000 cm/s AV VTI:            0.268 m AV Peak Grad:      8.1 mmHg AV Mean Grad:      5.5 mmHg LVOT Vmax:         124.00 cm/s LVOT Vmean:        72.600 cm/s LVOT VTI:          0.224 m LVOT/AV VTI ratio: 0.84  AORTA Ao Root diam: 2.40 cm MITRAL VALVE                TRICUSPID VALVE MV Area (PHT): 6.54 cm     TR Peak grad:   10.8 mmHg MV Decel Time: 116 msec     TR Vmax:        164.00 cm/s MV E velocity: 81.50 cm/s MV A velocity: 129.00 cm/s  SHUNTS MV E/A ratio:  0.63         Systemic VTI:  0.22 m                             Systemic Diam: 2.30 cm Ida Rogue MD Electronically signed by Ida Rogue MD Signature Date/Time: 04/30/2020/4:30:45 PM    Final    US THYROID  Result Date: 04/30/2020 CLINICAL DATA:  Thyroid nodule EXAM: THYROID ULTRASOUND TECHNIQUE: Ultrasound examination of the thyroid gland and adjacent soft tissues was performed.  COMPARISON:  04/29/2020 FINDINGS: Parenchymal Echotexture: Mildly heterogenous Isthmus: 0.9 cm Right lobe: 3.4 x 2.5 x 1.3 cm Left lobe: 3.7 x 1.9 x 1 4 cm _________________________________________________________ Estimated total number of nodules >/= 1 cm: 1 Number of spongiform nodules >/=  2 cm not described below (TR1): 0 Number of mixed cystic and solid nodules >/= 1.5 cm not described below ([place]): 0 _________________________________________________________ Nodule # 1: Location: Isthmus; Mid Maximum size: 1.8 cm; Other 2 dimensions: 1.8 x 1.1 cm Composition: solid/almost completely solid (2) Echogenicity: isoechoic (1) Shape: not taller-than-wide (0) Margins: ill-defined (0) Echogenic foci: none (0) ACR TI-RADS total points: 3. ACR TI-RADS risk category: TR3 (3 points). ACR TI-RADS recommendations: *Given size (>/= 1.5 - 2.4 cm) and appearance, a follow-up ultrasound in 1 year should be considered based on TI-RADS criteria. _________________________________________________________ Inferior and separate from the thyroid extending into the superior mediastinum there is a round mildly heterogeneous hypoechoic solid mass measuring 3.1 x 2.3 x 2.3 cm. This does have similar echogenicity to thyroid tissue and may represent accessory or ectopic thyroid. Additional considerations including lymphoma, thymoma, or nodal metastasis, all felt to be less likely. IMPRESSION: 1.8 cm mid isthmus TR 3 nodule meets criteria for follow-up in 1 year. 3.1 cm superior mediastinal indeterminate mass. See above comment. Lesion does appear to be approachable for ultrasound biopsy. The above is in keeping with the ACR TI-RADS recommendations - J Am Coll Radiol 2017;14:587-595. Electronically Signed   By: Jerilynn Mages.  Shick M.D.   On: 04/30/2020 15:44    Labs:  CBC: Recent Labs    04/05/20 1648 04/29/20 0934 04/30/20 0541 05/01/20 0653  WBC 9.0 8.7 13.8* 20.6*  HGB 14.2 14.0 13.3 12.4  HCT 43.1 42.2 38.9 38.0  PLT 360 328  333 338     COAGS: Recent Labs    12/08/19 1004  INR 1.0  APTT 27    BMP: Recent Labs    04/05/20 1648 04/05/20 1648 04/29/20 0934 04/30/20 0541 05/01/20 0653 05/02/20 0759  NA 140  --  142 139 141  --   K 3.5   < > 4.0 3.9 2.9* 3.5  CL 107  --  107 107 109  --   CO2 24  --  23 23 26   --   GLUCOSE 101*  --  127* 233* 146*  --   BUN 19  --  12 16 13   --   CALCIUM 8.9  --  9.0 8.9 8.8*  --   CREATININE 0.67  --  0.56 0.54 0.48  --   GFRNONAA >60  --  >60 >60 >60  --   GFRAA >60  --  >60 >60 >60  --    < > = values in this interval not displayed.    LIVER FUNCTION TESTS: Recent Labs    11/07/19 0213 12/16/19 1649 04/29/20 0934  BILITOT 0.4 0.2 0.6  AST 16 26 22   ALT 17 29 21   ALKPHOS 66 86 64  PROT 7.2 6.9 7.4  ALBUMIN 3.6 4.2 3.5    TUMOR MARKERS: No results for input(s): AFPTM, CEA, CA199, CHROMGRNA in the last 8760 hours.  Assessment and Plan: Patient with past medical history of DM, HTN, arthritis presents with complaint of mediastinal mass.  IR consulted for biopsy at the request of Dr. Tasia Catchings. Case reviewed by Dr. Annamaria Boots who approves patient for procedure.  Patient presents today in their usual state of health.  She has been NPO and is not currently on blood thinners.  Extensive discussion held regarding the process and care plan for procedure today.  Provided reassurance of our goal to minimize any pain as much as we are able to safely do. Discussed the nature of sedation medicine and its benefits and limitations compared to anesthesia.  She is agreeable to proceed.  Encouraged her to discuss her report of occasional painful swallow with her providers.   Risks and benefits of biopsy was discussed with the patient and/or patient's family including, but not limited to bleeding, infection, damage to adjacent structures or low yield requiring additional tests.  All of the questions were answered and there is agreement to proceed.  Consent signed and in  chart.  Thank you for this interesting consult.  I greatly enjoyed meeting Catherine Giles and look forward to participating in their care.  A copy of this report was sent to the requesting provider on this date.  Electronically Signed: Docia Barrier, PA 05/17/2020, 11:17 AM   I spent a total of  30 Minutes   in face to face in clinical consultation, greater than 50% of which was counseling/coordinating care for mediastinal mass.

## 2020-05-17 NOTE — Discharge Instructions (Signed)

## 2020-05-17 NOTE — Progress Notes (Signed)
PA paged to bedside for patient with pain post-procedure.  Patient has thus far recovered well in short stay without complaint. She has tolerated her lunch with minimal difficulty, however now has progressively worsening pain in her neck at the site of her procedure.  Discussed with Dr. Annamaria Boots.  No technical complications noted at the time of the procedure.  No proximity to trachea or esophagus.  Upon assessment, patient is tearful stating her pain is worsening.  Physical exam without acute findings with the exception of tenderness to palpation near the procedure site. Discussed the nature of post-procedure pain and that she is likely experiencing discomfort as the sedation medicine wears off.  Patient given a dose of pain medication and provided with ice pack.    Given care instructions.  Prescription for Norco 5/325 sent to [place] to mitigate post-procedure pain in the next 24-48 hrs. Provided reassurance that this pain is expected to improve.   Brynda Greathouse, MS RD PA-C 1:42 PM

## 2020-05-17 NOTE — Procedures (Signed)
Interventional Radiology Procedure Note  Procedure: [place] substernal mediastinal mass bx    Complications: None  Estimated Blood Loss:  min  Findings: 18 and 16 g cores attempted, samples fragmented/scant    [name], MD

## 2020-05-18 LAB — SURGICAL PATHOLOGY

## 2020-05-19 ENCOUNTER — Ambulatory Visit: Payer: Medicare Other | Admitting: Oncology

## 2020-05-24 ENCOUNTER — Encounter: Payer: Self-pay | Admitting: *Deleted

## 2020-05-24 ENCOUNTER — Other Ambulatory Visit: Payer: Self-pay

## 2020-05-24 ENCOUNTER — Inpatient Hospital Stay: Payer: Medicare Other | Attending: Oncology | Admitting: Oncology

## 2020-05-24 ENCOUNTER — Encounter: Payer: Self-pay | Admitting: Oncology

## 2020-05-24 VITALS — BP 124/82 | HR 103 | Temp 97.7°F | Resp 18 | Wt 171.4 lb

## 2020-05-24 DIAGNOSIS — J9859 Other diseases of mediastinum, not elsewhere classified: Secondary | ICD-10-CM

## 2020-05-24 DIAGNOSIS — I10 Essential (primary) hypertension: Secondary | ICD-10-CM | POA: Diagnosis not present

## 2020-05-24 DIAGNOSIS — R5383 Other fatigue: Secondary | ICD-10-CM | POA: Insufficient documentation

## 2020-05-24 DIAGNOSIS — R0781 Pleurodynia: Secondary | ICD-10-CM | POA: Insufficient documentation

## 2020-05-24 DIAGNOSIS — M797 Fibromyalgia: Secondary | ICD-10-CM | POA: Insufficient documentation

## 2020-05-24 DIAGNOSIS — M25512 Pain in left shoulder: Secondary | ICD-10-CM | POA: Insufficient documentation

## 2020-05-24 DIAGNOSIS — R918 Other nonspecific abnormal finding of lung field: Secondary | ICD-10-CM | POA: Diagnosis not present

## 2020-05-24 DIAGNOSIS — R1310 Dysphagia, unspecified: Secondary | ICD-10-CM | POA: Diagnosis not present

## 2020-05-24 DIAGNOSIS — E041 Nontoxic single thyroid nodule: Secondary | ICD-10-CM | POA: Insufficient documentation

## 2020-05-24 DIAGNOSIS — Z833 Family history of diabetes mellitus: Secondary | ICD-10-CM | POA: Insufficient documentation

## 2020-05-24 DIAGNOSIS — Z881 Allergy status to other antibiotic agents status: Secondary | ICD-10-CM | POA: Diagnosis not present

## 2020-05-24 DIAGNOSIS — R600 Localized edema: Secondary | ICD-10-CM | POA: Diagnosis not present

## 2020-05-24 DIAGNOSIS — N281 Cyst of kidney, acquired: Secondary | ICD-10-CM | POA: Diagnosis not present

## 2020-05-24 DIAGNOSIS — M069 Rheumatoid arthritis, unspecified: Secondary | ICD-10-CM | POA: Insufficient documentation

## 2020-05-24 DIAGNOSIS — F17210 Nicotine dependence, cigarettes, uncomplicated: Secondary | ICD-10-CM | POA: Insufficient documentation

## 2020-05-24 DIAGNOSIS — Q892 Congenital malformations of other endocrine glands: Secondary | ICD-10-CM | POA: Diagnosis not present

## 2020-05-24 DIAGNOSIS — R590 Localized enlarged lymph nodes: Secondary | ICD-10-CM | POA: Diagnosis not present

## 2020-05-24 DIAGNOSIS — Z79899 Other long term (current) drug therapy: Secondary | ICD-10-CM | POA: Insufficient documentation

## 2020-05-24 DIAGNOSIS — K8020 Calculus of gallbladder without cholecystitis without obstruction: Secondary | ICD-10-CM | POA: Insufficient documentation

## 2020-05-24 DIAGNOSIS — Z880 Allergy status to penicillin: Secondary | ICD-10-CM | POA: Diagnosis not present

## 2020-05-24 DIAGNOSIS — M542 Cervicalgia: Secondary | ICD-10-CM | POA: Insufficient documentation

## 2020-05-24 DIAGNOSIS — R222 Localized swelling, mass and lump, trunk: Secondary | ICD-10-CM | POA: Insufficient documentation

## 2020-05-24 DIAGNOSIS — Z801 Family history of malignant neoplasm of trachea, bronchus and lung: Secondary | ICD-10-CM | POA: Insufficient documentation

## 2020-05-24 DIAGNOSIS — R05 Cough: Secondary | ICD-10-CM | POA: Diagnosis not present

## 2020-05-24 DIAGNOSIS — D472 Monoclonal gammopathy: Secondary | ICD-10-CM | POA: Insufficient documentation

## 2020-05-24 DIAGNOSIS — J449 Chronic obstructive pulmonary disease, unspecified: Secondary | ICD-10-CM | POA: Insufficient documentation

## 2020-05-24 DIAGNOSIS — Z8041 Family history of malignant neoplasm of ovary: Secondary | ICD-10-CM | POA: Insufficient documentation

## 2020-05-24 DIAGNOSIS — M79643 Pain in unspecified hand: Secondary | ICD-10-CM | POA: Diagnosis not present

## 2020-05-24 DIAGNOSIS — Z803 Family history of malignant neoplasm of breast: Secondary | ICD-10-CM | POA: Insufficient documentation

## 2020-05-24 DIAGNOSIS — Z8249 Family history of ischemic heart disease and other diseases of the circulatory system: Secondary | ICD-10-CM | POA: Insufficient documentation

## 2020-05-24 NOTE — Progress Notes (Signed)
Hematology/Oncology Consult note [facility] [phone] [phone]   Patient Care Team: Guadalupe Maple, MD as PCP - General (Family Medicine)  REFERRING PROVIDER: Guadalupe Maple, MD  CHIEF COMPLAINTS/REASON FOR VISIT:  Follow-up for mediastinum mass.  HISTORY OF PRESENTING ILLNESS:  Christene Pounds is a  49 y.o.  female with PMH listed below was seen in consultation at the request of  Guadalupe Maple, MD  for evaluation of abnormalities of plasm protein She followed up with Dr.Bock for rheumatoid arthritis, fibromyalgia, She has elevated CCP 62, elevated rheumatoid factor 123.  She was advised to start [place].   Negative CCP, ANA, normal C3,  Work up showed abnormal protein electrophoresis, IgA monoclonal protein with light chain. Normal urine protein electrophoresis. She was referred to heme-onc for further evaluation.  She reports having ongoing pain everywhere. She reports having ongoing pain everywhere.  Moved to [place] this year. Single mom, lives with kids.   INTERVAL HISTORY Crissa Sowder is a 49 y.o. female who has above history reviewed by me today presents for follow up visit  Problems and complaints are listed below: Patient reports ongoing persistent dysphagia. Swallowing is associated with mid chest spasm pain. She was recommended to see gastroenterology however she never establish care. 04/29/2020, patient had a CT chest angiogram which showed no evidence of pulmonary embolism. No acute intrathoracic process. Indeterminate 2.5 x 1.9 cm mass in the superior mediastinum, the mass is separate from but just below the thyroid gland. Differential consideration include lymphoma, thymoma, nodal metastasis, accessory thyroid. Indeterminate 2 cm enhancing lesion in the caudate lobe of the liver. Borderline enlarged bilateral axillary lymph node nonspecific. Emphysema. 04/30/2020, patient had MRI abdomen with and without contrast which  showed a 1.8 lesion in the caudate lobe is most consistent with focal nodular hyperplasia. Patient needs to have a follow-up imaging in 6 months to confirm stability.  -04/30/2020, ultrasound thyroid showed 1.8 cm mid isthmus TR3 nodule meets criteria for follow-up in 1 year. 3.1 cm superior mediastinum indeterminate mass. Approachable for ultrasound-guided biopsy -05/17/2020 patient underwent ultrasound guided biopsy of the mediastinal mass.  Today patient presents to further discuss biopsy results.  Review of Systems  Constitutional: Positive for fatigue. Negative for appetite change, chills, fever and unexpected weight change.  HENT:   Negative for hearing loss and voice change.   Eyes: Negative for eye problems.  Respiratory: Negative for chest tightness, cough and shortness of breath.   Cardiovascular: Negative for chest pain.  Gastrointestinal: Negative for abdominal distention, abdominal pain and blood in stool.       Dysphagia  Endocrine: Negative for hot flashes.  Genitourinary: Negative for difficulty urinating and frequency.   Musculoskeletal: Negative for myalgias.  Skin: Negative for itching and rash.  Neurological: Negative for extremity weakness.  Hematological: Negative for adenopathy.  Psychiatric/Behavioral: Negative for confusion and depression.    MEDICAL HISTORY:  Past Medical History:  Diagnosis Date  . Arthritis    RA  . COPD (chronic obstructive pulmonary disease) ([place])   . Emphysema of lung ([place])   . Family history of breast cancer    6/21 cancer genetic testing letter sent  . Hypertension   . Lupus ([place])   . Pre-diabetes   . Thyroid disease     SURGICAL HISTORY: Past Surgical History:  Procedure Laterality Date  . MOUTH SURGERY      SOCIAL HISTORY: Social History   Socioeconomic History  . Marital status: Divorced    Spouse name:  Not on file  . Number of children: Not on file  . Years of education: Not on file  . Highest education level:  Not on file  Occupational History  . Not on file  Tobacco Use  . Smoking status: Current Every Day Smoker    Packs/day: 1.00    Years: 34.00    Pack years: 34.00    Types: Cigarettes  . Smokeless tobacco: Never Used  Vaping Use  . Vaping Use: Never used  Substance and Sexual Activity  . Alcohol use: Yes    Comment: Occ  . Drug use: Not Currently  . Sexual activity: Yes  Other Topics Concern  . Not on file  Social History Narrative  . Not on file   Social Determinants of Health   Financial Resource Strain:   . Difficulty of Paying Living Expenses:   Food Insecurity:   . Worried About Charity fundraiser in the Last Year:   . Arboriculturist in the Last Year:   Transportation Needs:   . Film/video editor (Medical):   Marland Kitchen Lack of Transportation (Non-Medical):   Physical Activity:   . Days of Exercise per Week:   . Minutes of Exercise per Session:   Stress:   . Feeling of Stress :   Social Connections:   . Frequency of Communication with Friends and Family:   . Frequency of Social Gatherings with Friends and Family:   . Attends Religious Services:   . Active Member of Clubs or Organizations:   . Attends Archivist Meetings:   Marland Kitchen Marital Status:   Intimate Partner Violence:   . Fear of Current or Ex-Partner:   . Emotionally Abused:   Marland Kitchen Physically Abused:   . Sexually Abused:     FAMILY HISTORY: Family History  Problem Relation Age of Onset  . Lung cancer Mother   . Diabetes Mother   . Breast cancer Mother 64  . Hypertension Father   . Cancer Maternal Aunt   . Ovarian cancer Maternal Aunt   . Diabetes Maternal Aunt   . Diabetes Maternal Uncle   . Hypertension Maternal Grandmother   . Diabetes Maternal Grandmother     ALLERGIES:  is allergic to azithromycin, penicillins, and shellfish allergy.  MEDICATIONS:  Current Outpatient Medications  Medication Sig Dispense Refill  . albuterol (VENTOLIN HFA) 108 (90 Base) MCG/ACT inhaler Inhale 2 puffs  into the lungs every 4 (four) hours as needed.     . cyclobenzaprine (FLEXERIL) 10 MG tablet Take 10 mg by mouth 3 (three) times daily as needed for muscle spasms.     Marland Kitchen gabapentin (NEURONTIN) 300 MG capsule Take 1 capsule (300 mg total) by mouth 3 (three) times daily. 90 capsule 3  . HYDROcodone-acetaminophen (NORCO) 5-325 MG tablet Take 1 tablet by mouth every 6 (six) hours as needed for moderate pain or severe pain. 7 tablet 0  . ipratropium-albuterol (DUONEB) 0.5-2.5 (3) MG/3ML SOLN Take 3 mLs by nebulization every 6 (six) hours as needed. 75 mL 0  . levofloxacin (LEVAQUIN) 750 MG tablet Take 1 tablet (750 mg total) by mouth daily. 4 tablet 0  . levothyroxine (SYNTHROID) 50 MCG tablet Take 50 mcg by mouth daily before breakfast.     . losartan (COZAAR) 100 MG tablet Take 100 mg by mouth daily.     . predniSONE (STERAPRED UNI-PAK 21 TAB) 10 MG (21) TBPK tablet Start 60 mg po daily, taper 10 mg daily until done 21 tablet 0  .  tiZANidine (ZANAFLEX) 4 MG tablet Take 4 mg by mouth every 6 (six) hours as needed.     . traMADol (ULTRAM) 50 MG tablet Take 50 mg by mouth every 6 (six) hours as needed.    . traZODone (DESYREL) 50 MG tablet Take 1 tablet (50 mg total) by mouth at bedtime. 30 tablet 3  . triamcinolone cream (KENALOG) 0.1 % Apply 1 application topically daily as needed. Apply to affected areas twice daily as needed. Avoid Face,Groin and underarms 453 g 1  . umeclidinium-vilanterol (ANORO ELLIPTA) 62.5-25 MCG/INH AEPB Inhale 1 puff into the lungs daily in the afternoon.     . chlorpheniramine-HYDROcodone (TUSSIONEX) 10-8 MG/5ML SUER Take 5 mLs by mouth every 12 (twelve) hours as needed for cough. (Patient not taking: Reported on 05/17/2020) 140 mL 0   No current facility-administered medications for this visit.     PHYSICAL EXAMINATION: ECOG PERFORMANCE STATUS: 1 - Symptomatic but completely ambulatory Vitals:   05/24/20 1443  BP: 124/82  Pulse: (!) 103  Resp: 18  Temp: 97.7 F (36.5  C)   Filed Weights   05/24/20 1443  Weight: 171 lb 6.4 oz (77.7 kg)    Physical Exam Constitutional:      General: She is not in acute distress. HENT:     Head: Normocephalic and atraumatic.  Eyes:     General: No scleral icterus.    Pupils: Pupils are equal, round, and reactive to light.  Cardiovascular:     Rate and Rhythm: Normal rate and regular rhythm.     Heart sounds: Normal heart sounds.  Pulmonary:     Effort: Pulmonary effort is normal. No respiratory distress.     Breath sounds: No wheezing.  Abdominal:     General: Bowel sounds are normal. There is no distension.     Palpations: Abdomen is soft. There is no mass.     Tenderness: There is no abdominal tenderness.  Musculoskeletal:        General: No deformity. Normal range of motion.     Cervical back: Normal range of motion and neck supple.  Skin:    General: Skin is warm and dry.     Findings: No erythema or rash.  Neurological:     Mental Status: She is alert and oriented to person, place, and time.     Cranial Nerves: No cranial nerve deficit.     Coordination: Coordination normal.  Psychiatric:        Behavior: Behavior normal.        Thought Content: Thought content normal.     LABORATORY DATA:  I have reviewed the data as listed Lab Results  Component Value Date   WBC 13.0 (H) 05/17/2020   HGB 14.2 05/17/2020   HCT 41.4 05/17/2020   MCV 92.4 05/17/2020   PLT 383 05/17/2020   Recent Labs    11/07/19 0213 12/08/19 1004 12/16/19 1649 04/05/20 1648 04/29/20 0934 04/29/20 0934 04/30/20 0541 05/01/20 0653 05/02/20 0759  NA 141   < > 141   < > 142  --  139 141  --   K 3.8   < > 4.3   < > 4.0   < > 3.9 2.9* 3.5  CL 111   < > 105   < > 107  --  107 109  --   CO2 24   < > 21   < > 23  --  23 26  --   GLUCOSE 137*   < >  79   < > 127*  --  233* 146*  --   BUN 16   < > 17   < > 12  --  16 13  --   CREATININE 0.55   < > 0.66   < > 0.56  --  0.54 0.48  --   CALCIUM 8.4*   < > 9.7   < > 9.0  --   8.9 8.8*  --   GFRNONAA >60   < > 103   < > >60  --  >60 >60  --   GFRAA >60   < > 119   < > >60  --  >60 >60  --   PROT 7.2  --  6.9  --  7.4  --   --   --   --   ALBUMIN 3.6  --  4.2  --  3.5  --   --   --   --   AST 16  --  26  --  22  --   --   --   --   ALT 17  --  29  --  21  --   --   --   --   ALKPHOS 66  --  86  --  64  --   --   --   --   BILITOT 0.4  --  0.2  --  0.6  --   --   --   --    < > = values in this interval not displayed.   Iron/TIBC/Ferritin/ %Sat No results found for: IRON, TIBC, FERRITIN, IRONPCTSAT    RADIOGRAPHIC STUDIES: I have personally reviewed the radiological images as listed and agreed with the findings in the report. DG Chest 2 View  Result Date: 04/05/2020 CLINICAL DATA:  Chest pain. EXAM: CHEST - 2 VIEW COMPARISON:  December 02, 2019. FINDINGS: The heart size and mediastinal contours are within normal limits. No pneumothorax or pleural effusion is noted. Mild lingular subsegmental atelectasis is noted. Right lung is clear. The visualized skeletal structures are unremarkable. IMPRESSION: Mild lingular subsegmental atelectasis. Electronically Signed   By: Marijo Conception M.D.   On: 04/05/2020 17:28   CT Angio Chest PE W and/or Wo Contrast  Result Date: 04/29/2020 CLINICAL DATA:  Shortness of breath and pleuritic chest pain with productive cough. EXAM: CT ANGIOGRAPHY CHEST WITH CONTRAST TECHNIQUE: Multidetector CT imaging of the chest was performed using the standard protocol during bolus administration of intravenous contrast. Multiplanar CT image reconstructions and MIPs were obtained to evaluate the vascular anatomy. CONTRAST:  12m OMNIPAQUE IOHEXOL 350 MG/ML SOLN COMPARISON:  Chest x-ray from same day. FINDINGS: Cardiovascular: Satisfactory opacification of the pulmonary arteries to the segmental level. No evidence of pulmonary embolism. Normal heart size. No pericardial effusion. No thoracic aortic aneurysm or dissection. Mediastinum/Nodes: There are a few  borderline enlarged bilateral axillary lymph nodes measuring up to 11 mm in short axis (series 4, image 6). No enlarged mediastinal or hilar lymph nodes. 2.5 x 1.9 cm mass in the superior mediastinum between the brachiocephalic and left common carotid arteries (series 4, image 19). The mass is separate from, but just below the thyroid gland, which is normal in appearance. The trachea and esophagus demonstrate no significant findings. Lungs/Pleura: No focal consolidation, pleural effusion, or pneumothorax. Mild paraseptal emphysema. Mild mosaic attenuation. Scattered areas of linear scarring throughout both lungs. Small calcified granuloma in the right lower lobe. No suspicious pulmonary nodule. Upper  Abdomen: No acute abnormality. Subtle geographic hypodensity involving the right liver as compared to the left liver is likely perfusional. 2.0 cm enhancing lesion in the caudate lobe (series 4, image 81). Small bilateral renal cysts. Musculoskeletal: No chest wall abnormality. No acute or significant osseous findings. Review of the MIP images confirms the above findings. IMPRESSION: 1. No evidence of pulmonary embolism. No acute intrathoracic process. 2. Mosaic attenuation may reflect small airways disease. 3. Indeterminate 2.5 x 1.9 cm mass in the superior mediastinum. The mass is separate from, but just below the thyroid gland, which is normal in appearance. Differential considerations include lymphoma, thymoma, nodal metastasis, or accessory thyroid. Recommend further evaluation with outpatient thyroid ultrasound with attention on the infrathyroid region. Tissue sampling may ultimately be needed for definitive diagnosis. 4. Indeterminate 2.0 cm enhancing lesion in the caudate lobe of the liver. Recommend further evaluation with outpatient liver protocol MRI of the abdomen with and without contrast. 5. Few borderline enlarged bilateral axillary lymph nodes measuring up to 11 mm in short axis, nonspecific. 6.  Emphysema (ICD10-J43.9). Electronically Signed   By: Titus Dubin M.D.   On: 04/29/2020 14:10   MR CERVICAL SPINE WO CONTRAST  Result Date: 03/11/2020 CLINICAL DATA:  Radiculopathy, cervical. Additional history provided by scanning technologist: Patient reports medial neck pain, left shoulder pain, hand pain which may be related to prior IV infiltration. EXAM: MRI CERVICAL SPINE WITHOUT CONTRAST TECHNIQUE: Multiplanar, multisequence MR imaging of the cervical spine was performed. No intravenous contrast was administered. COMPARISON:  CT cervical spine 04/24/2019 FINDINGS: Multiple sequences are motion degraded. Most notably there is mild/moderate motion degradation of the sagittal T2/STIR sequence and of the axial T2 weighted cecal. Alignment: Reversal of the expected cervical lordosis. C4-C5 grade 1 anterolisthesis. Vertebrae: Vertebral body height is maintained. Trace C6-C7 degenerative endplate marrow edema. No suspicious osseous lesion is identified. Small C6 inferior endplate Schmorl node. Cord: No spinal cord signal abnormality is identified within the limitations of motion degradation. Posterior Fossa, vertebral arteries, paraspinal tissues: No abnormality identified within included portions of the posterior fossa. Flow voids preserved within the imaged cervical vertebral arteries. Paraspinal soft tissues within normal limits. Disc levels: Moderate C4-C5, C5-C6 and C6-C7 disc degeneration. Mild disc degeneration at the remaining levels. C2-C3: Small disc bulge. Mild uncinate hypertrophy on the right. No significant spinal canal stenosis or neural foraminal narrowing. C3-C4: Mild facet hypertrophy. No significant disc herniation, spinal canal or foraminal narrowing. C4-C5: Grade 1 anterolisthesis. Disc uncovering with mild disc bulge. Mild facet arthropathy. Uncinate hypertrophy (greater on the right). No significant spinal canal stenosis. Moderate right neural foraminal narrowing. C5-C6: Disc bulge.  Superimposed small left center disc protrusion (series 10, image 18) (series 5, image 9). Uncovertebral hypertrophy. The disc protrusion contacts and mildly flattens the left ventral spinal cord contributing to overall mild spinal canal stenosis. Mild bilateral neural foraminal narrowing. C6-C7: Disc bulge. Superimposed tiny right center disc protrusion (series 5, image 5). Uncovertebral hypertrophy. Mild facet hypertrophy. No significant central canal stenosis or cord mass effect. However, the disc protrusion could potentially affect the exiting right C7 nerve root within the right foraminal entry zone. Mild bilateral neural foraminal narrowing. C7-T1: No significant disc herniation or stenosis. IMPRESSION: Cervical spondylosis as described and most notably as follows. At C4-C5, there is moderate disc degeneration. Grade 1 anterolisthesis. Multifactorial moderate right neural foraminal narrowing. At C5-C6, moderate disc degeneration. A small left center disc protrusion contacts and mildly flattens the left ventral spinal cord contributing to overall mild spinal  canal stenosis. Multifactorial mild bilateral neural foraminal narrowing. At C6-C7, moderate disc degeneration. There is a tiny right center disc protrusion which could affect the exiting right C7 nerve root within the right foraminal entry zone. No significant spinal canal stenosis. Multifactorial mild bilateral neural foraminal narrowing. Electronically Signed   By: Kellie Simmering DO   On: 03/11/2020 16:59   MR ABDOMEN W WO CONTRAST  Result Date: 04/30/2020 CLINICAL DATA:  Indeterminate liver lesion on recent chest CT. EXAM: MRI ABDOMEN WITHOUT AND WITH CONTRAST TECHNIQUE: Multiplanar multisequence MR imaging of the abdomen was performed both before and after the administration of intravenous contrast. CONTRAST:  4m GADAVIST GADOBUTROL 1 MMOL/ML IV SOLN COMPARISON:  Chest CTA on 04/29/2020 FINDINGS: Lower chest: No acute findings. Hepatobiliary: Image  degradation by motion artifact noted particularly the dynamic postcontrast sequences. A lesion is seen in the caudate lobe which shows arterial phase hyperenhancement, and measures 1.8 cm on image 35/12. This is isointense with liver on delayed sequences and also shows T2 isointensity. This is most consistent with focal nodular hyperplasia. No other liver masses are identified. A 1.6 cm gallstone is seen. No evidence of cholecystitis or biliary ductal dilatation. Pancreas:  No mass or inflammatory changes. Spleen:  Within normal limits in size and appearance. Adrenals/Urinary Tract: Multiple tiny renal cysts are seen bilaterally. No masses identified. No evidence of hydronephrosis. Stomach/Bowel: Visualized portion unremarkable. Vascular/Lymphatic: No pathologically enlarged lymph nodes identified. No abdominal aortic aneurysm. Other:  None. Musculoskeletal:  No suspicious bone lesions identified. IMPRESSION: Image degradation by motion artifact noted. 1.8 cm lesion in the caudate lobe is most consistent with focal nodular hyperplasia. Recommend follow-up imaging in 6 months to confirm stability, preferably with abdomen CT with contrast due to motion artifact seen on today's exam. Cholelithiasis. No radiographic evidence of cholecystitis or biliary ductal dilatation. Electronically Signed   By: JMarlaine HindM.D.   On: 04/30/2020 14:09   DG Chest Portable 1 View  Result Date: 04/29/2020 CLINICAL DATA:  Shortness of breath, cough. EXAM: PORTABLE CHEST 1 VIEW COMPARISON:  April 05, 2020 FINDINGS: Increased basilar opacity at the RIGHT lung base compared to the prior imaging study. Similar appearance of basilar opacities the LEFT lung base. Trachea is midline. Cardiomediastinal contours and hilar structures are normal. Subtle nodular density projectsa over the RIGHT upper chest, new compared to the prior study along with subtle opacity in the LEFT upper lobe as well. No overt signs of effusion. Question of scarring  and or bronchiectasis along the RIGHT heart border. Visualized skeletal structures on limited assessment are unremarkable. IMPRESSION: 1. Increased basilar opacity at the RIGHT lung base compared to the prior imaging study, suspicious for developing pneumonia, perhaps multifocal given new nodular opacities, quite subtle in the upper lobes. Suggest follow-up to ensure resolution. 2. Lingular scarring and question of bronchiectasis along the RIGHT heart border raising the question of chronic component. Electronically Signed   By: GZetta BillsM.D.   On: 04/29/2020 09:57   ECHOCARDIOGRAM COMPLETE  Result Date: 04/30/2020    ECHOCARDIOGRAM REPORT   Patient Name:   SMAKEILA YAMAGUCHIDate of Exam: 04/30/2020 Medical Rec #:  [id number]    Height:       63.0 in Accession #:    [id number]   Weight:       170.0 lb Date of Birth:  [birth year]    BSA:          1.805 m Patient Age:    89years  BP:           160/82 mmHg Patient Gender: F             HR:           108 bpm. Exam Location:  [facility] Procedure: 2D Echo, Cardiac Doppler and Color Doppler Indications:     Chest pain 786.50  History:         Patient has no prior history of Echocardiogram examinations.                  COPD; Risk Factors:Hypertension.  Sonographer:     Destanie Sport RDCS (AE) Referring Phys:  [id number] Collier Bullock Diagnosing Phys: Ida Rogue MD IMPRESSIONS  1. Left ventricular ejection fraction, by estimation, is 60 to 65%. The left ventricle has normal function. The left ventricle has no regional wall motion abnormalities. Left ventricular diastolic parameters are consistent with Grade I diastolic dysfunction (impaired relaxation).  2. Right ventricular systolic function is normal. The right ventricular size is normal. There is normal pulmonary artery systolic pressure. FINDINGS  Left Ventricle: Left ventricular ejection fraction, by estimation, is 60 to 65%. The left ventricle has normal function. The left ventricle has no regional wall motion  abnormalities. The left ventricular internal cavity size was normal in size. There is  no left ventricular hypertrophy. Left ventricular diastolic parameters are consistent with Grade I diastolic dysfunction (impaired relaxation). Right Ventricle: The right ventricular size is normal. No increase in right ventricular wall thickness. Right ventricular systolic function is normal. There is normal pulmonary artery systolic pressure. The tricuspid regurgitant velocity is 1.64 m/s, and  with an assumed right atrial pressure of 10 mmHg, the estimated right ventricular systolic pressure is 51.7 mmHg. Left Atrium: Left atrial size was normal in size. Right Atrium: Right atrial size was normal in size. Pericardium: There is no evidence of pericardial effusion. Mitral Valve: The mitral valve is normal in structure. Normal mobility of the mitral valve leaflets. No evidence of mitral valve regurgitation. No evidence of mitral valve stenosis. Tricuspid Valve: The tricuspid valve is normal in structure. Tricuspid valve regurgitation is trivial. No evidence of tricuspid stenosis. Aortic Valve: The aortic valve was not well visualized. Aortic valve regurgitation is not visualized. No aortic stenosis is present. Aortic valve mean gradient measures 5.5 mmHg. Aortic valve peak gradient measures 8.1 mmHg. Aortic valve area, by VTI measures 3.47 cm. Pulmonic Valve: The pulmonic valve was normal in structure. Pulmonic valve regurgitation is not visualized. No evidence of pulmonic stenosis. Aorta: The aortic root is normal in size and structure. Venous: The inferior vena cava is normal in size with greater than 50% respiratory variability, suggesting right atrial pressure of 3 mmHg. IAS/Shunts: No atrial level shunt detected by color flow Doppler.  LEFT VENTRICLE PLAX 2D LVIDd:         4.65 cm  Diastology LVIDs:         2.28 cm  LV e' lateral:   7.62 cm/s LV PW:         1.29 cm  LV E/e' lateral: 10.7 LV IVS:        1.25 cm  LV e' medial:     6.53 cm/s LVOT diam:     2.30 cm  LV E/e' medial:  12.5 LV SV:         93 LV SV Index:   52 LVOT Area:     4.15 cm  RIGHT VENTRICLE RV Basal diam:  2.84 cm RV S prime:  12.90 cm/s TAPSE (M-mode): 2.6 cm LEFT ATRIUM             Index       RIGHT ATRIUM           Index LA diam:        3.30 cm 1.83 cm/m  RA Area:     16.00 cm LA Vol (A2C):   31.8 ml 17.62 ml/m RA Volume:   43.60 ml  24.16 ml/m LA Vol (A4C):   21.7 ml 12.02 ml/m LA Biplane Vol: 27.2 ml 15.07 ml/m  AORTIC VALVE                    PULMONIC VALVE AV Area (Vmax):    3.63 cm     PV Vmax:        0.80 m/s AV Area (Vmean):   2.82 cm     PV Peak grad:   2.6 mmHg AV Area (VTI):     3.47 cm     RVOT Peak grad: 4 mmHg AV Vmax:           142.00 cm/s AV Vmean:          107.000 cm/s AV VTI:            0.268 m AV Peak Grad:      8.1 mmHg AV Mean Grad:      5.5 mmHg LVOT Vmax:         124.00 cm/s LVOT Vmean:        72.600 cm/s LVOT VTI:          0.224 m LVOT/AV VTI ratio: 0.84  AORTA Ao Root diam: 2.40 cm MITRAL VALVE                TRICUSPID VALVE MV Area (PHT): 6.54 cm     TR Peak grad:   10.8 mmHg MV Decel Time: 116 msec     TR Vmax:        164.00 cm/s MV E velocity: 81.50 cm/s MV A velocity: 129.00 cm/s  SHUNTS MV E/A ratio:  0.63         Systemic VTI:  0.22 m                             Systemic Diam: 2.30 cm Ida Rogue MD Electronically signed by Ida Rogue MD Signature Date/Time: 04/30/2020/4:30:45 PM    Final    US THYROID  Result Date: 04/30/2020 CLINICAL DATA:  Thyroid nodule EXAM: THYROID ULTRASOUND TECHNIQUE: Ultrasound examination of the thyroid gland and adjacent soft tissues was performed. COMPARISON:  04/29/2020 FINDINGS: Parenchymal Echotexture: Mildly heterogenous Isthmus: 0.9 cm Right lobe: 3.4 x 2.5 x 1.3 cm Left lobe: 3.7 x 1.9 x 1 4 cm _________________________________________________________ Estimated total number of nodules >/= 1 cm: 1 Number of spongiform nodules >/=  2 cm not described below (TR1): 0 Number of mixed  cystic and solid nodules >/= 1.5 cm not described below ([place]): 0 _________________________________________________________ Nodule # 1: Location: Isthmus; Mid Maximum size: 1.8 cm; Other 2 dimensions: 1.8 x 1.1 cm Composition: solid/almost completely solid (2) Echogenicity: isoechoic (1) Shape: not taller-than-wide (0) Margins: ill-defined (0) Echogenic foci: none (0) ACR TI-RADS total points: 3. ACR TI-RADS risk category: TR3 (3 points). ACR TI-RADS recommendations: *Given size (>/= 1.5 - 2.4 cm) and appearance, a follow-up ultrasound in 1 year should be considered based on TI-RADS criteria. _________________________________________________________ Inferior and separate from the thyroid  extending into the superior mediastinum there is a round mildly heterogeneous hypoechoic solid mass measuring 3.1 x 2.3 x 2.3 cm. This does have similar echogenicity to thyroid tissue and may represent accessory or ectopic thyroid. Additional considerations including lymphoma, thymoma, or nodal metastasis, all felt to be less likely. IMPRESSION: 1.8 cm mid isthmus TR 3 nodule meets criteria for follow-up in 1 year. 3.1 cm superior mediastinal indeterminate mass. See above comment. Lesion does appear to be approachable for ultrasound biopsy. The above is in keeping with the ACR TI-RADS recommendations - J Am Coll Radiol 2017;14:587-595. Electronically Signed   By: Jerilynn Mages.  Shick M.D.   On: 04/30/2020 15:44   [place] CORE BIOPSY (SOFT TISSUE)  Result Date: 05/17/2020 INDICATION: Anterior superior 3 cm mediastinal mass EXAM: ULTRASOUND CORE BIOPSY ANTERIOR SUPERIOR MEDIASTINAL MASS MEDICATIONS: 1% lidocaine local ANESTHESIA/SEDATION: Moderate (conscious) sedation was employed during this procedure. A total of Versed 3 mg and Fentanyl 75 mcg was administered intravenously. Moderate Sedation Time: 23 minutes. The patient's level of consciousness and vital signs were monitored continuously by radiology nursing throughout the procedure under my  direct supervision. FLUOROSCOPY TIME:  Fluoroscopy Time: None. COMPLICATIONS: None immediate. PROCEDURE: Informed written consent was obtained from the patient after a thorough discussion of the procedural risks, benefits and alternatives. All questions were addressed. Maximal Sterile Barrier Technique was utilized including caps, mask, sterile gowns, sterile gloves, sterile drape, hand hygiene and skin antiseptic. A timeout was performed prior to the initiation of the procedure. Previous imaging reviewed. Preliminary ultrasound performed. The 3 cm mediastinal mass was localized and marked for a substernal notch approach. Under sterile conditions and local anesthesia, an 18 gauge core biopsy was advanced to the lesion. Core biopsies obtained under direct ultrasound. Images obtained for documentation. Samples were small and fragmented. Needle was exchanged for a 16 gauge core biopsy needle. Two additional 16 gauge core biopsies obtained in a similar fashion. These were slightly more cellular. Samples placed in formalin. Postprocedure imaging demonstrates no hematoma. Patient tolerated biopsy well. No immediate complication. IMPRESSION: Successful ultrasound anterior superior mediastinal mass 18 and 16 gauge core biopsies. Electronically Signed   By: Jerilynn Mages.  Shick M.D.   On: 05/17/2020 13:22     ASSESSMENT & PLAN:  1. IgA monoclonal gammopathy of uncertain significance   2. Odynophagia   3. Mediastinal mass    #Mediastinal mass status post ultrasound-guided biopsy, Pathology showed benign thyroid tissue, consistent with ectopic thyroid. Scant fragments of fibroadipose tissue. No thymic tissue identified. Negative for malignancy. I will hold off additional work-up at this point. Continue observation. ? Is the ectopic thyroid related to her odynophagia symptoms. She may need to have an evaluation with surgery for the need of mass resection.  #Odynophagia, dysphagia, etiology unknown. I previously recommend her  to establish care with gastroenterology. Patient did not establish care with GI yet. Will refer to [facility] for further evaluation.  #History of IgA monoclonal gammopathy. Repeat multiple myeloma panel  Rheumatoid arthritis, Fibromyalgia, continue follow-up with primary care provider and rheumatology.  Orders Placed This Encounter  Procedures  . Multiple Myeloma Panel (SPEP&IFE w/QIG)    Standing Status:   Future    Standing Expiration Date:   05/24/2021  . Kappa/lambda light chains    Standing Status:   Future    Standing Expiration Date:   05/24/2021  . Ambulatory referral to Gastroenterology    Referral Priority:   Routine    Referral Type:   Consultation    Referral Reason:  Specialty Services Required    Referred to Provider:   Jonathon Bellows, MD    Number of Visits Requested:   1    All questions were answered. The patient knows to call the clinic with any problems questions or concerns. Return of visit: To be determined.  Earlie Server, MD, PhD Hematology Oncology [facility] Pager- [phone] 05/24/2020

## 2020-05-24 NOTE — Progress Notes (Signed)
Pt here for follow up. No new concerns voiced.   

## 2020-05-24 NOTE — Progress Notes (Signed)
°  Oncology Nurse Navigator Documentation  Navigator Location: [facility] (05/24/20 1600)   )Navigator Encounter Type: Appt/Treatment Plan Review (05/24/20 1600)                         Barriers/Navigation Needs: No Barriers At This Time (05/24/20 1600)   Interventions: None Required (05/24/20 1600)                      Time Spent with Patient: 15 (05/24/20 1600)

## 2020-05-25 ENCOUNTER — Inpatient Hospital Stay: Payer: Medicare Other

## 2020-05-25 ENCOUNTER — Telehealth: Payer: Self-pay

## 2020-05-25 ENCOUNTER — Other Ambulatory Visit: Payer: Self-pay

## 2020-05-25 DIAGNOSIS — Q892 Congenital malformations of other endocrine glands: Secondary | ICD-10-CM

## 2020-05-25 NOTE — Telephone Encounter (Signed)
-----   Message from Earlie Server, MD sent at 05/24/2020 10:48 PM EDT ----- Please let her know that I also recommend her to establish care with Dr.Juengel for evaluation of ectopic thyroid. Thanks.

## 2020-05-25 NOTE — Telephone Encounter (Signed)
Pt informed of referral and referral faxed to [facility] in [place] (Dr. Kathyrn Sheriff). Fax confirmation received.   Ph: [phone] Fx: [phone]

## 2020-05-31 NOTE — Addendum Note (Signed)
Addended by: Dorthula Nettles on: 05/31/2020 08:11 PM   Modules accepted: Level of Service

## 2020-06-01 DIAGNOSIS — R0789 Other chest pain: Secondary | ICD-10-CM | POA: Diagnosis not present

## 2020-06-01 DIAGNOSIS — G479 Sleep disorder, unspecified: Secondary | ICD-10-CM | POA: Diagnosis not present

## 2020-06-01 DIAGNOSIS — R079 Chest pain, unspecified: Secondary | ICD-10-CM | POA: Diagnosis not present

## 2020-06-01 DIAGNOSIS — R05 Cough: Secondary | ICD-10-CM | POA: Diagnosis not present

## 2020-06-01 DIAGNOSIS — M0579 Rheumatoid arthritis with rheumatoid factor of multiple sites without organ or systems involvement: Secondary | ICD-10-CM | POA: Diagnosis not present

## 2020-06-08 ENCOUNTER — Ambulatory Visit: Payer: Self-pay

## 2020-06-10 ENCOUNTER — Ambulatory Visit: Payer: Self-pay

## 2020-06-12 ENCOUNTER — Ambulatory Visit (INDEPENDENT_AMBULATORY_CARE_PROVIDER_SITE_OTHER): Payer: Medicare Other

## 2020-06-12 ENCOUNTER — Other Ambulatory Visit: Payer: Self-pay

## 2020-06-12 ENCOUNTER — Ambulatory Visit
Admission: RE | Admit: 2020-06-12 | Discharge: 2020-06-12 | Disposition: A | Discharge status: Home / Self Care | Payer: Medicare Other | Source: Ambulatory Visit | Attending: Family Medicine | Admitting: Family Medicine

## 2020-06-12 VITALS — BP 131/89 | HR 96 | Temp 98.6°F | Resp 16 | Ht 63.0 in | Wt 167.0 lb

## 2020-06-12 DIAGNOSIS — R0602 Shortness of breath: Secondary | ICD-10-CM | POA: Diagnosis not present

## 2020-06-12 DIAGNOSIS — G4700 Insomnia, unspecified: Secondary | ICD-10-CM | POA: Diagnosis not present

## 2020-06-12 DIAGNOSIS — R053 Chronic cough: Secondary | ICD-10-CM

## 2020-06-12 DIAGNOSIS — Z20822 Contact with and (suspected) exposure to covid-19: Secondary | ICD-10-CM | POA: Insufficient documentation

## 2020-06-12 DIAGNOSIS — R5383 Other fatigue: Secondary | ICD-10-CM | POA: Insufficient documentation

## 2020-06-12 DIAGNOSIS — J984 Other disorders of lung: Secondary | ICD-10-CM | POA: Diagnosis not present

## 2020-06-12 DIAGNOSIS — M5412 Radiculopathy, cervical region: Secondary | ICD-10-CM | POA: Insufficient documentation

## 2020-06-12 DIAGNOSIS — M069 Rheumatoid arthritis, unspecified: Secondary | ICD-10-CM | POA: Diagnosis not present

## 2020-06-12 DIAGNOSIS — J9811 Atelectasis: Secondary | ICD-10-CM | POA: Diagnosis not present

## 2020-06-12 DIAGNOSIS — Z79899 Other long term (current) drug therapy: Secondary | ICD-10-CM | POA: Diagnosis not present

## 2020-06-12 DIAGNOSIS — E669 Obesity, unspecified: Secondary | ICD-10-CM | POA: Diagnosis not present

## 2020-06-12 DIAGNOSIS — M0579 Rheumatoid arthritis with rheumatoid factor of multiple sites without organ or systems involvement: Secondary | ICD-10-CM | POA: Insufficient documentation

## 2020-06-12 DIAGNOSIS — M25552 Pain in left hip: Secondary | ICD-10-CM | POA: Diagnosis not present

## 2020-06-12 DIAGNOSIS — F17210 Nicotine dependence, cigarettes, uncomplicated: Secondary | ICD-10-CM | POA: Diagnosis not present

## 2020-06-12 DIAGNOSIS — R05 Cough: Secondary | ICD-10-CM

## 2020-06-12 DIAGNOSIS — E042 Nontoxic multinodular goiter: Secondary | ICD-10-CM | POA: Insufficient documentation

## 2020-06-12 DIAGNOSIS — M797 Fibromyalgia: Secondary | ICD-10-CM | POA: Diagnosis not present

## 2020-06-12 DIAGNOSIS — Z7901 Long term (current) use of anticoagulants: Secondary | ICD-10-CM | POA: Diagnosis not present

## 2020-06-12 DIAGNOSIS — I10 Essential (primary) hypertension: Secondary | ICD-10-CM | POA: Diagnosis not present

## 2020-06-12 DIAGNOSIS — E039 Hypothyroidism, unspecified: Secondary | ICD-10-CM | POA: Insufficient documentation

## 2020-06-12 DIAGNOSIS — Z6829 Body mass index (BMI) 29.0-29.9, adult: Secondary | ICD-10-CM | POA: Diagnosis not present

## 2020-06-12 DIAGNOSIS — R0981 Nasal congestion: Secondary | ICD-10-CM | POA: Diagnosis not present

## 2020-06-12 DIAGNOSIS — J439 Emphysema, unspecified: Secondary | ICD-10-CM | POA: Diagnosis not present

## 2020-06-12 DIAGNOSIS — G8929 Other chronic pain: Secondary | ICD-10-CM | POA: Insufficient documentation

## 2020-06-12 DIAGNOSIS — M5136 Other intervertebral disc degeneration, lumbar region: Secondary | ICD-10-CM | POA: Diagnosis not present

## 2020-06-12 DIAGNOSIS — J449 Chronic obstructive pulmonary disease, unspecified: Secondary | ICD-10-CM | POA: Diagnosis not present

## 2020-06-12 DIAGNOSIS — R7303 Prediabetes: Secondary | ICD-10-CM | POA: Insufficient documentation

## 2020-06-12 MED ORDER — IPRATROPIUM-ALBUTEROL 0.5-2.5 (3) MG/3ML IN SOLN: 3.0000 mL | Freq: Four times a day (QID) | RESPIRATORY_TRACT | 0 refills | Status: DC | PRN

## 2020-06-12 NOTE — ED Triage Notes (Signed)
Patient states that she was treated for Pneumonia last month.  Patient reports ongoing cough, chest congestion and SOB.  Patient denies fevers.

## 2020-06-12 NOTE — ED Provider Notes (Signed)
[facility]    CSN: [id number] Arrival date & time: 06/12/20  1221      History   Chief Complaint Chief Complaint  Patient presents with   Appointment   Cough   Shortness of Breath    HPI Catherine Giles is a 49 y.o. female.   49 y/o female with history of rheumatoid arthritis, hypertension, COPD/emphysema, Lupus, thyroid disease presents for ongoing cough x nearly 2 months. She says she was admitted to the hospital at the beginning of July 2021 for pneumonia and respiratory failure. Since then she says she has had a continued cough. She has had multiple imaging studies. She says she saw her pulmonologist 06/01/2020 and was placed on 10 day course of levofloxacin that she finished 2 days ago. Cough apparently seemed to worse after finishing antibiotics. She says she has also been on prednisone daily and they have been trying to wean her off. She believes she is currently on 10 mg prednisone. Patient denies fever. Admits to fatigue. She says she has occasional SOB, but denies SOB currently. Patient denies recent COVID exposure. No immunizations received for COVID. Patient has been continuing to use inhalers as instructed. She says she is short on time today and will need to leave before any test results come in if we do any testing. No other concerns today.     Past Medical History:  Diagnosis Date   Arthritis    RA   COPD (chronic obstructive pulmonary disease) ([place])    Emphysema of lung ([place])    Family history of breast cancer    6/21 cancer genetic testing letter sent   Hypertension    Lupus [facility])    Pre-diabetes    Thyroid disease     Patient Active Problem List   Diagnosis Date Noted   Acute respiratory failure with hypoxia ([place])    CAP (community acquired pneumonia) 04/29/2020   COPD with acute bronchitis ([place]) 04/29/2020   Vaginal discharge 03/19/2020   Vaginal stenosis 03/19/2020   Obesity 03/19/2020   Insomnia 01/05/2020   Complex  regional pain syndrome I of upper limb 12/17/2019   Pain of left hand 12/16/2019   Essential hypertension 12/13/2019   Centrilobular emphysema ([place]) 12/13/2019   Elevated hemoglobin A1c 12/13/2019   Hypothyroid 12/13/2019   DDD (degenerative disc disease), lumbar 08/26/2019   Multinodular goiter 08/14/2019   Chronic left hip pain 05/05/2019   Osteoarthritis of spine with radiculopathy, cervical region 05/05/2019   Encounter for long-term (current) use of high-risk medication 02/14/2019   Fibromyalgia 02/10/2019   Rheumatoid arthritis involving multiple sites with positive rheumatoid factor ([place]) 02/10/2019   Undifferentiated connective tissue disease ([place]) 02/10/2019   Cervical radiculopathy 03/13/2018   Vitamin D deficiency 10/11/2015    Past Surgical History:  Procedure Laterality Date   MOUTH SURGERY      OB History    Gravida  9   Para  6   Term      Preterm      AB  3   Living        SAB  2   TAB      Ectopic      Multiple      Live Births               Home Medications    Prior to Admission medications   Medication Sig Start Date End Date Taking? Authorizing Provider  gabapentin (NEURONTIN) 300 MG capsule Take 1 capsule (300 mg total) by  mouth 3 (three) times daily. 12/16/19  Yes Cannady, Jolene T, NP  HYDROcodone-acetaminophen (NORCO) 5-325 MG tablet Take 1 tablet by mouth every 6 (six) hours as needed for moderate pain or severe pain. 05/17/20  Yes Docia Barrier, PA  levothyroxine (SYNTHROID) 50 MCG tablet Take 50 mcg by mouth daily before breakfast.    Yes [provider]  losartan (COZAAR) 100 MG tablet Take 100 mg by mouth daily.    Yes [provider]  predniSONE (STERAPRED UNI-PAK 21 TAB) 10 MG (21) TBPK tablet Start 60 mg po daily, taper 10 mg daily until done 05/02/20  Yes Max Sane, MD  umeclidinium-vilanterol (ANORO ELLIPTA) 62.5-25 MCG/INH AEPB Inhale 1 puff into the lungs daily in the  afternoon.  02/12/19  Yes [provider]  albuterol (VENTOLIN HFA) 108 (90 Base) MCG/ACT inhaler Inhale 2 puffs into the lungs every 4 (four) hours as needed.  02/12/19 05/24/20  [provider]  chlorpheniramine-HYDROcodone (TUSSIONEX) 10-8 MG/5ML SUER Take 5 mLs by mouth every 12 (twelve) hours as needed for cough. Patient not taking: Reported on 05/17/2020 05/02/20   Max Sane, MD  cyclobenzaprine (FLEXERIL) 10 MG tablet Take 10 mg by mouth 3 (three) times daily as needed for muscle spasms.     [provider]  ipratropium-albuterol (DUONEB) 0.5-2.5 (3) MG/3ML SOLN Take 3 mLs by nebulization every 6 (six) hours as needed for up to 7 days. 06/12/20 06/19/20  Danton Clap, PA-C  levofloxacin (LEVAQUIN) 750 MG tablet Take 1 tablet (750 mg total) by mouth daily. 05/03/20   Max Sane, MD  tiZANidine (ZANAFLEX) 4 MG tablet Take 4 mg by mouth every 6 (six) hours as needed.     [provider]  traMADol (ULTRAM) 50 MG tablet Take 50 mg by mouth every 6 (six) hours as needed.    [provider]  traZODone (DESYREL) 50 MG tablet Take 1 tablet (50 mg total) by mouth at bedtime. 01/05/20   Cannady, Henrine Screws T, NP  triamcinolone cream (KENALOG) 0.1 % Apply 1 application topically daily as needed. Apply to affected areas twice daily as needed. Avoid Face,Groin and underarms 03/23/20   Brendolyn Patty, MD    Family History Family History  Problem Relation Age of Onset   Lung cancer Mother    Diabetes Mother    Breast cancer Mother 15   Hypertension Father    Cancer Maternal Aunt    Ovarian cancer Maternal Aunt    Diabetes Maternal Aunt    Diabetes Maternal Uncle    Hypertension Maternal Grandmother    Diabetes Maternal Grandmother     Social History Social History   Tobacco Use   Smoking status: Current Every Day Smoker    Packs/day: 1.00    Years: 34.00    Pack years: 34.00    Types: Cigarettes   Smokeless tobacco: Never Used  Brewing technologist Use: Never used  Substance Use Topics   Alcohol use: Yes    Comment: Occ   Drug use: Not Currently     Allergies   Azithromycin, Penicillins, and Shellfish allergy   Review of Systems Review of Systems  Constitutional: Positive for fatigue. Negative for chills, diaphoresis and fever.  HENT: Negative for congestion, ear pain, rhinorrhea, sinus pressure, sinus pain and sore throat.   Respiratory: Positive for cough and shortness of breath (intermittent x 2 months).   Cardiovascular: Negative for chest pain and palpitations.  Gastrointestinal: Negative for abdominal pain, nausea and vomiting.  Musculoskeletal: Positive for myalgias. Negative for arthralgias.  Skin: Negative for rash.  Neurological: Negative for weakness and headaches.  Hematological: Negative for adenopathy.     Physical Exam Triage Vital Signs ED Triage Vitals  Enc Vitals Group     BP 06/12/20 1258 131/89     Pulse Rate 06/12/20 1258 96     Resp 06/12/20 1258 16     Temp 06/12/20 1258 98.6 F (37 C)     Temp Source 06/12/20 1258 Oral     SpO2 06/12/20 1258 96 %     Weight 06/12/20 1254 167 lb (75.8 kg)     Height 06/12/20 1254 5\' 3"  (1.6 m)     Head Circumference --      Peak Flow --      Pain Score 06/12/20 1254 0     Pain Loc --      Pain Edu? --      Excl. in [place]? --    No data found.  Updated Vital Signs BP 131/89 (BP Location: Left Arm)    Pulse 96    Temp 98.6 F (37 C) (Oral)    Resp 16    Ht 5\' 3"  (1.6 m)    Wt 167 lb (75.8 kg)    SpO2 96%    BMI 29.58 kg/m       Physical Exam Vitals and nursing note reviewed.  Constitutional:      General: She is not in acute distress.    Appearance: Normal appearance. She is not ill-appearing or toxic-appearing.  HENT:     Head: Normocephalic and atraumatic.     Nose: Nose normal.     Mouth/Throat:     Mouth: Mucous membranes are moist.     Pharynx: Oropharynx is clear.  Eyes:     General: No scleral icterus.       Right eye: No  discharge.        Left eye: No discharge.     Conjunctiva/sclera: Conjunctivae normal.  Cardiovascular:     Rate and Rhythm: Normal rate and regular rhythm.     Heart sounds: Normal heart sounds.  Pulmonary:     Effort: Pulmonary effort is normal. No respiratory distress.     Breath sounds: Wheezing (few scattered wheezes throughout bilateral lungs) present. No rhonchi or rales.  Musculoskeletal:     Cervical back: Neck supple.  Skin:    General: Skin is dry.  Neurological:     General: No focal deficit present.     Mental Status: She is alert. Mental status is at baseline.     Motor: No weakness.     Gait: Gait normal.  Psychiatric:        Mood and Affect: Mood normal.        Behavior: Behavior normal.        Thought Content: Thought content normal.      UC Treatments / Results  Labs (all labs ordered are listed, but only abnormal results are displayed) Labs Reviewed  SARS CORONAVIRUS 2 (TAT 6-24 HRS)    EKG   Radiology DG Chest 2 View  Result Date: 06/12/2020 CLINICAL DATA:  Cough and chest congestion EXAM: CHEST - 2 VIEW COMPARISON:  04/29/2020 FINDINGS: Patchy linear opacity on both sides, same finding seen previously although in a slightly different distribution. Generalized interstitial coarsening without Kerley lines or effusion. No consolidation or air leak. Normal heart size and mediastinal contours IMPRESSION: Chronic lung disease with multifocal mild atelectasis. Electronically Signed  By: Monte Fantasia M.D.   On: 06/12/2020 13:59    Procedures Procedures (including critical care time)  Medications Ordered in UC Medications - No data to display  Initial Impression / Assessment and Plan / UC Course  I have reviewed the triage vital signs and the nursing notes.  Pertinent labs & imaging results that were available during my care of the patient were reviewed by me and considered in my medical decision making (see chart for details).    49 y/o female  with chronic cough and lung disease presenting today for chronic cough and chronic SOB. Symptoms not worsening recently. Reviewed ED visits and pulmonology visit from 06/01/2020.   Patient seen by pulmonology 06/01/2020 and given 10 day prescription for levofloxacin 500 mg.   Patient did not want to wait for imaging results. She asked to be called with results. Declined AVS and said she needed to hurry up and take her daughter to a mental health appointment. Advised her that since she is stable, I can call her with results. If no evidence of pneumonia, will treat with consider treating with prednisone taper and inhalers and she should call her pulmonologist Monday. If evidence of pneumonia will send doxycycline to pharmacy. ED precautions discussed. Pt agreeable to plan.   06/13/2020: Four attempts to contact patient to discuss chest x-ray made and unable to ever get in touch with patient. She is currently on low dose prednisone and has only been off levaquin for 2 days at this point. I have decided to hold off on any prescriptions (other than duoneb) at this time since I cannot get in touch with patient and chest x-ray does not show any acute changes. Patient has been advised to call her pulmonologist tomorrow for follow up and ED precautions reviewed with patient.  Final Clinical Impressions(s) / UC Diagnoses   Final diagnoses:  Shortness of breath  Chronic cough  Chronic obstructive pulmonary disease, unspecified COPD type [facility])   Discharge Instructions   None    ED Prescriptions    Medication Sig Dispense Auth. Provider   ipratropium-albuterol (DUONEB) 0.5-2.5 (3) MG/3ML SOLN Take 3 mLs by nebulization every 6 (six) hours as needed for up to 7 days. 360 mL Danton Clap, PA-C     PDMP not reviewed this encounter.   Danton Clap, PA-C 06/13/20 1700

## 2020-06-13 LAB — SARS CORONAVIRUS 2 (TAT 6-24 HRS): SARS Coronavirus 2: NEGATIVE

## 2020-06-14 ENCOUNTER — Other Ambulatory Visit: Payer: Medicare Other

## 2020-06-18 DIAGNOSIS — I10 Essential (primary) hypertension: Secondary | ICD-10-CM | POA: Diagnosis not present

## 2020-06-18 DIAGNOSIS — H04123 Dry eye syndrome of bilateral lacrimal glands: Secondary | ICD-10-CM | POA: Diagnosis not present

## 2020-06-18 DIAGNOSIS — E119 Type 2 diabetes mellitus without complications: Secondary | ICD-10-CM | POA: Diagnosis not present

## 2020-06-21 ENCOUNTER — Ambulatory Visit: Payer: Medicare Other | Admitting: Oncology

## 2020-07-01 ENCOUNTER — Ambulatory Visit
Admission: EM | Admit: 2020-07-01 | Discharge: 2020-07-01 | Disposition: A | Discharge status: Home / Self Care | Payer: Medicare Other | Attending: Family Medicine | Admitting: Family Medicine

## 2020-07-01 ENCOUNTER — Other Ambulatory Visit: Payer: Self-pay

## 2020-07-01 ENCOUNTER — Ambulatory Visit (INDEPENDENT_AMBULATORY_CARE_PROVIDER_SITE_OTHER): Payer: Medicare Other

## 2020-07-01 ENCOUNTER — Encounter: Payer: Self-pay | Admitting: Emergency Medicine

## 2020-07-01 DIAGNOSIS — J432 Centrilobular emphysema: Secondary | ICD-10-CM | POA: Insufficient documentation

## 2020-07-01 DIAGNOSIS — Z881 Allergy status to other antibiotic agents status: Secondary | ICD-10-CM | POA: Insufficient documentation

## 2020-07-01 DIAGNOSIS — R531 Weakness: Secondary | ICD-10-CM | POA: Diagnosis not present

## 2020-07-01 DIAGNOSIS — I10 Essential (primary) hypertension: Secondary | ICD-10-CM | POA: Insufficient documentation

## 2020-07-01 DIAGNOSIS — Z79899 Other long term (current) drug therapy: Secondary | ICD-10-CM | POA: Insufficient documentation

## 2020-07-01 DIAGNOSIS — Z880 Allergy status to penicillin: Secondary | ICD-10-CM | POA: Diagnosis not present

## 2020-07-01 DIAGNOSIS — R05 Cough: Secondary | ICD-10-CM | POA: Diagnosis not present

## 2020-07-01 DIAGNOSIS — F17210 Nicotine dependence, cigarettes, uncomplicated: Secondary | ICD-10-CM | POA: Diagnosis not present

## 2020-07-01 DIAGNOSIS — M5136 Other intervertebral disc degeneration, lumbar region: Secondary | ICD-10-CM | POA: Insufficient documentation

## 2020-07-01 DIAGNOSIS — Z8249 Family history of ischemic heart disease and other diseases of the circulatory system: Secondary | ICD-10-CM | POA: Insufficient documentation

## 2020-07-01 DIAGNOSIS — R7303 Prediabetes: Secondary | ICD-10-CM | POA: Insufficient documentation

## 2020-07-01 DIAGNOSIS — M069 Rheumatoid arthritis, unspecified: Secondary | ICD-10-CM | POA: Diagnosis not present

## 2020-07-01 DIAGNOSIS — J449 Chronic obstructive pulmonary disease, unspecified: Secondary | ICD-10-CM | POA: Diagnosis not present

## 2020-07-01 DIAGNOSIS — Z833 Family history of diabetes mellitus: Secondary | ICD-10-CM | POA: Diagnosis not present

## 2020-07-01 DIAGNOSIS — E039 Hypothyroidism, unspecified: Secondary | ICD-10-CM | POA: Diagnosis not present

## 2020-07-01 DIAGNOSIS — J441 Chronic obstructive pulmonary disease with (acute) exacerbation: Secondary | ICD-10-CM | POA: Diagnosis not present

## 2020-07-01 DIAGNOSIS — M797 Fibromyalgia: Secondary | ICD-10-CM | POA: Insufficient documentation

## 2020-07-01 DIAGNOSIS — R0602 Shortness of breath: Secondary | ICD-10-CM

## 2020-07-01 DIAGNOSIS — Z79890 Hormone replacement therapy (postmenopausal): Secondary | ICD-10-CM | POA: Diagnosis not present

## 2020-07-01 DIAGNOSIS — Z20822 Contact with and (suspected) exposure to covid-19: Secondary | ICD-10-CM | POA: Insufficient documentation

## 2020-07-01 DIAGNOSIS — M0579 Rheumatoid arthritis with rheumatoid factor of multiple sites without organ or systems involvement: Secondary | ICD-10-CM | POA: Diagnosis not present

## 2020-07-01 LAB — CBC WITH DIFFERENTIAL/PLATELET
Abs Immature Granulocytes: 0.02 10*3/uL (ref 0.00–0.07)
Basophils Absolute: 0.1 10*3/uL (ref 0.0–0.1)
Basophils Relative: 1 %
Eosinophils Absolute: 0.1 10*3/uL (ref 0.0–0.5)
Eosinophils Relative: 1 %
HCT: 43.2 % (ref 36.0–46.0)
Hemoglobin: 14.2 g/dL (ref 12.0–15.0)
Immature Granulocytes: 0 %
Lymphocytes Relative: 47 %
Lymphs Abs: 3.6 10*3/uL (ref 0.7–4.0)
MCH: 30.9 pg (ref 26.0–34.0)
MCHC: 32.9 g/dL (ref 30.0–36.0)
MCV: 93.9 fL (ref 80.0–100.0)
Monocytes Absolute: 0.4 10*3/uL (ref 0.1–1.0)
Monocytes Relative: 6 %
Neutro Abs: 3.4 10*3/uL (ref 1.7–7.7)
Neutrophils Relative %: 45 %
Platelets: 369 10*3/uL (ref 150–400)
RBC: 4.6 MIL/uL (ref 3.87–5.11)
RDW: 14.2 % (ref 11.5–15.5)
WBC: 7.6 10*3/uL (ref 4.0–10.5)
nRBC: 0.3 % — ABNORMAL HIGH (ref 0.0–0.2)

## 2020-07-01 LAB — SARS CORONAVIRUS 2 (TAT 6-24 HRS): SARS Coronavirus 2: NEGATIVE

## 2020-07-01 LAB — COMPREHENSIVE METABOLIC PANEL
ALT: 20 U/L (ref 0–44)
AST: 15 U/L (ref 15–41)
Albumin: 3.6 g/dL (ref 3.5–5.0)
Alkaline Phosphatase: 59 U/L (ref 38–126)
Anion gap: 8 (ref 5–15)
BUN: 11 mg/dL (ref 6–20)
CO2: 26 mmol/L (ref 22–32)
Calcium: 8.8 mg/dL — ABNORMAL LOW (ref 8.9–10.3)
Chloride: 106 mmol/L (ref 98–111)
Creatinine, Ser: 0.39 mg/dL — ABNORMAL LOW (ref 0.44–1.00)
GFR calc Af Amer: >60 mL/min (ref >60)
GFR calc non Af Amer: >60 mL/min (ref >60)
Glucose, Bld: 125 mg/dL — ABNORMAL HIGH (ref 70–99)
Potassium: 3.7 mmol/L (ref 3.5–5.1)
Sodium: 140 mmol/L (ref 135–145)
Total Bilirubin: 0.5 mg/dL (ref 0.3–1.2)
Total Protein: 7.3 g/dL (ref 6.5–8.1)

## 2020-07-01 MED ORDER — FLUTICASONE PROPIONATE 50 MCG/ACT NA SUSP
2.0000 | Freq: Every day | NASAL | 0 refills | Status: DC
Start: 2020-07-01 — End: 2022-03-12

## 2020-07-01 MED ORDER — PREDNISONE 10 MG PO TABS
ORAL_TABLET | ORAL | 0 refills | Status: DC
Start: 2020-07-01 — End: 2020-08-23

## 2020-07-01 NOTE — ED Provider Notes (Signed)
[facility]    CSN: [id number] Arrival date & time: 07/01/20  0818      History   Chief Complaint Chief Complaint  Patient presents with  . Cough  . Nasal Congestion  . Shortness of Breath    HPI Catherine Giles is a 49 y.o. female who has recurrent cough x 5 days. Had URI 3 weeks ago and was getting better. Has been having cough attacks with production of yellow. Has had body aches. Has loss smell. Has been very stuffy. Has been feeling SOB all the time. Has been wheezing and her albuterol neb treatments dont seem to be helping her. She is a smoker.  She believes she had Covid in 2019 before anyone knew about covid. Had Pfyzer Covid  injections this year.  Her her records she finished Levaquin on 8/20 and had seen Pulmonologist.  Has Fu with pulmonolgist next month.  Past Medical History:  Diagnosis Date  . Arthritis    RA  . COPD (chronic obstructive pulmonary disease) ([place])   . Emphysema of lung ([place])   . Family history of breast cancer    6/21 cancer genetic testing letter sent  . Hypertension   . Lupus ([place])   . Pre-diabetes   . Thyroid disease     Patient Active Problem List   Diagnosis Date Noted  . Acute respiratory failure with hypoxia ([place])   . CAP (community acquired pneumonia) 04/29/2020  . COPD with acute bronchitis ([place]) 04/29/2020  . Vaginal discharge 03/19/2020  . Vaginal stenosis 03/19/2020  . Obesity 03/19/2020  . Insomnia 01/05/2020  . Complex regional pain syndrome I of upper limb 12/17/2019  . Pain of left hand 12/16/2019  . Essential hypertension 12/13/2019  . Centrilobular emphysema ([place]) 12/13/2019  . Elevated hemoglobin A1c 12/13/2019  . Hypothyroid 12/13/2019  . DDD (degenerative disc disease), lumbar 08/26/2019  . Multinodular goiter 08/14/2019  . Chronic left hip pain 05/05/2019  . Osteoarthritis of spine with radiculopathy, cervical region 05/05/2019  . Encounter for long-term (current) use of high-risk medication  02/14/2019  . Fibromyalgia 02/10/2019  . Rheumatoid arthritis involving multiple sites with positive rheumatoid factor ([place]) 02/10/2019  . Undifferentiated connective tissue disease ([place]) 02/10/2019  . Cervical radiculopathy 03/13/2018  . Vitamin D deficiency 10/11/2015    Past Surgical History:  Procedure Laterality Date  . MOUTH SURGERY      OB History    Gravida  9   Para  6   Term      Preterm      AB  3   Living        SAB  2   TAB      Ectopic      Multiple      Live Births              Home Medications    Prior to Admission medications   Medication Sig Start Date End Date Taking? Authorizing Provider  gabapentin (NEURONTIN) 300 MG capsule Take 1 capsule (300 mg total) by mouth 3 (three) times daily. 12/16/19  Yes Cannady, Henrine Screws T, NP  levothyroxine (SYNTHROID) 50 MCG tablet Take 50 mcg by mouth daily before breakfast.    Yes [provider]  losartan (COZAAR) 100 MG tablet Take 100 mg by mouth daily.    Yes [provider]  tiZANidine (ZANAFLEX) 4 MG tablet Take 4 mg by mouth every 6 (six) hours as needed.    Yes [provider]  traMADol Veatrice Bourbon) 50  MG tablet Take 50 mg by mouth every 6 (six) hours as needed.   Yes [provider]  triamcinolone cream (KENALOG) 0.1 % Apply 1 application topically daily as needed. Apply to affected areas twice daily as needed. Avoid Face,Groin and underarms 03/23/20  Yes Brendolyn Patty, MD  umeclidinium-vilanterol [facility] ELLIPTA) 62.5-25 MCG/INH AEPB Inhale 1 puff into the lungs daily in the afternoon.  02/12/19  Yes [provider]  albuterol (VENTOLIN HFA) 108 (90 Base) MCG/ACT inhaler Inhale 2 puffs into the lungs every 4 (four) hours as needed.  02/12/19 05/24/20  [provider]  fluticasone (FLONASE) 50 MCG/ACT nasal spray Place 2 sprays into both nostrils daily. 07/01/20   Rodriguez-Southworth, Sunday Spillers, PA-C  ipratropium-albuterol (DUONEB) 0.5-2.5 (3) MG/3ML SOLN Take 3  mLs by nebulization every 6 (six) hours as needed for up to 7 days. 06/12/20 06/19/20  Laurene Footman B, PA-C  predniSONE (DELTASONE) 10 MG tablet One qd x 5 days 07/01/20   Rodriguez-Southworth, Sunday Spillers, PA-C  traZODone (DESYREL) 50 MG tablet Take 1 tablet (50 mg total) by mouth at bedtime. 01/05/20 07/01/20  Venita Lick, NP    Family History Family History  Problem Relation Age of Onset  . Lung cancer Mother   . Diabetes Mother   . Breast cancer Mother 77  . Hypertension Father   . Cancer Maternal Aunt   . Ovarian cancer Maternal Aunt   . Diabetes Maternal Aunt   . Diabetes Maternal Uncle   . Hypertension Maternal Grandmother   . Diabetes Maternal Grandmother     Social History Social History   Tobacco Use  . Smoking status: Current Every Day Smoker    Packs/day: 1.00    Years: 34.00    Pack years: 34.00    Types: Cigarettes  . Smokeless tobacco: Never Used  Vaping Use  . Vaping Use: Never used  Substance Use Topics  . Alcohol use: Yes    Comment: Occ  . Drug use: Not Currently     Allergies   Azithromycin, Penicillins, and Shellfish allergy   Review of Systems  + for nose congestion, fatigue, SOB, wheezing, chest tightness, cough attacks, nausea, the rest of the 10 point ROS is neg.    Physical Exam Triage Vital Signs ED Triage Vitals  Enc Vitals Group     BP 07/01/20 0836 (!) 141/95     Pulse Rate 07/01/20 0836 87     Resp 07/01/20 0836 (!) 22     Temp 07/01/20 0836 98.8 F (37.1 C)     Temp Source 07/01/20 0836 Oral     SpO2 07/01/20 0836 98 %     Weight 07/01/20 0833 167 lb (75.8 kg)     Height 07/01/20 0833 5\' 3"  (1.6 m)     Head Circumference --      Peak Flow --      Pain Score 07/01/20 0833 0     Pain Loc --      Pain Edu? --      Excl. in [place]? --    No data found.  Updated Vital Signs BP (!) 141/95 (BP Location: Right Arm)   Pulse 87   Temp 98.8 F (37.1 C) (Oral)   Resp (!) 22   Ht 5\' 3"  (1.6 m)   Wt 167 lb (75.8 kg)   SpO2 98%    BMI 29.58 kg/m   Visual Acuity Right Eye Distance:   Left Eye Distance:   Bilateral Distance:    Right Eye  Near:   Left Eye Near:    Bilateral Near:     Physical Exam Vitals and nursing note reviewed.  Constitutional:      Appearance: She is obese. She is ill-appearing. She is not toxic-appearing.  HENT:     Head: Normocephalic.  Eyes:     Extraocular Movements: Extraocular movements intact.     Pupils: Pupils are equal, round, and reactive to light.  Cardiovascular:     Rate and Rhythm: Normal rate and regular rhythm.  Pulmonary:     Effort: Pulmonary effort is normal. No respiratory distress.     Comments: Has wheezing throughout Musculoskeletal:        General: Normal range of motion.  Skin:    General: Skin is warm and dry.  Neurological:     General: No focal deficit present.     Mental Status: She is alert.  Psychiatric:        Mood and Affect: Mood normal.        Behavior: Behavior normal.     UC Treatments / Results  Labs (all labs ordered are listed, but only abnormal results are displayed) Labs Reviewed  CBC WITH DIFFERENTIAL/PLATELET - Abnormal; Notable for the following components:      Result Value   nRBC 0.3 (*)    All other components within normal limits  COMPREHENSIVE METABOLIC PANEL - Abnormal; Notable for the following components:   Glucose, Bld 125 (*)    Creatinine, Ser 0.39 (*)    Calcium 8.8 (*)    All other components within normal limits  SARS CORONAVIRUS 2 (TAT 6-24 HRS)    EKG  Radiology DG Chest 2 View  Result Date: 07/01/2020 CLINICAL DATA:  Cough and shortness of breath EXAM: CHEST - 2 VIEW COMPARISON:  06/12/2020 FINDINGS: Cardiac shadow is within normal limits. The lungs are again hyperinflated. Linear areas of scarring are again noted bilaterally stable from the prior study. No focal infiltrate or effusion is seen. No bony abnormality is noted. IMPRESSION: COPD without acute abnormality. Stable bilateral scarring  Electronically Signed   By: Inez Catalina M.D.   On: 07/01/2020 09:19    Procedures Procedures (including critical care time)  Medications Ordered in UC Medications - No data to display  Initial Impression / Assessment and Plan / UC Course  I have reviewed the triage vital signs and the nursing notes. Her CBC is normal, so I dont believe she has a secondary infection. I reviewed her CMP and told her to monitor her glucose since the prednisone can cause elevation and to avoid too much carbs.  Pertinent  imaging results that were available during my care of the patient were reviewed by me and considered in my medical decision making (see chart for details). Covid test is pending Final Clinical Impressions(s) / UC Diagnoses   Final diagnoses:  COPD exacerbation ([place])  Weakness     Discharge Instructions     Your chest xray is normal. YOU NEED TO STOP SMOKING Please use the duo neb every 4 hours.  Follow up with your pulmonologist in 4 days. There is nothing else we can do for you here regarding your COPD.  You may use Flonase for your stuffy nose. 2 sprays each nostril for 7 days.  I am ordering blood work and I will call you when the results are back.     ED Prescriptions    Medication Sig Dispense Auth. Provider   predniSONE (DELTASONE) 10 MG tablet One qd x  5 days 15 tablet Rodriguez-Southworth, Cadi Rhinehart, PA-C   fluticasone (FLONASE) 50 MCG/ACT nasal spray Place 2 sprays into both nostrils daily. 16 g Rodriguez-Southworth, Sunday Spillers, PA-C     PDMP not reviewed this encounter.   Shelby Mattocks, [place] 07/01/20 1132

## 2020-07-01 NOTE — Discharge Instructions (Addendum)
Your chest xray is normal. YOU NEED TO STOP SMOKING Please use the duo neb every 4 hours.  Follow up with your pulmonologist in 4 days. There is nothing else we can do for you here regarding your COPD.  You may use Flonase for your stuffy nose. 2 sprays each nostril for 7 days.  I am ordering blood work and I will call you when the results are back.

## 2020-07-01 NOTE — ED Triage Notes (Signed)
Patient c/o cough, nasal congestion, sob that started 2 days ago.

## 2020-07-08 ENCOUNTER — Emergency Department
Admission: EM | Admit: 2020-07-08 | Discharge: 2020-07-08 | Disposition: A | Discharge status: Home / Self Care | Payer: Medicare Other | Attending: Emergency Medicine | Admitting: Emergency Medicine

## 2020-07-08 ENCOUNTER — Emergency Department: Payer: Medicare Other

## 2020-07-08 ENCOUNTER — Encounter: Payer: Self-pay | Admitting: Emergency Medicine

## 2020-07-08 ENCOUNTER — Other Ambulatory Visit: Payer: Self-pay

## 2020-07-08 DIAGNOSIS — M069 Rheumatoid arthritis, unspecified: Secondary | ICD-10-CM | POA: Insufficient documentation

## 2020-07-08 DIAGNOSIS — R52 Pain, unspecified: Secondary | ICD-10-CM | POA: Diagnosis not present

## 2020-07-08 DIAGNOSIS — R0902 Hypoxemia: Secondary | ICD-10-CM | POA: Diagnosis not present

## 2020-07-08 DIAGNOSIS — M79643 Pain in unspecified hand: Secondary | ICD-10-CM | POA: Diagnosis not present

## 2020-07-08 DIAGNOSIS — Z79890 Hormone replacement therapy (postmenopausal): Secondary | ICD-10-CM | POA: Insufficient documentation

## 2020-07-08 DIAGNOSIS — M79641 Pain in right hand: Secondary | ICD-10-CM

## 2020-07-08 DIAGNOSIS — I10 Essential (primary) hypertension: Secondary | ICD-10-CM | POA: Diagnosis not present

## 2020-07-08 DIAGNOSIS — J449 Chronic obstructive pulmonary disease, unspecified: Secondary | ICD-10-CM | POA: Insufficient documentation

## 2020-07-08 DIAGNOSIS — E039 Hypothyroidism, unspecified: Secondary | ICD-10-CM | POA: Diagnosis not present

## 2020-07-08 DIAGNOSIS — F17210 Nicotine dependence, cigarettes, uncomplicated: Secondary | ICD-10-CM | POA: Insufficient documentation

## 2020-07-08 DIAGNOSIS — M79642 Pain in left hand: Secondary | ICD-10-CM

## 2020-07-08 DIAGNOSIS — Z79899 Other long term (current) drug therapy: Secondary | ICD-10-CM | POA: Insufficient documentation

## 2020-07-08 DIAGNOSIS — M329 Systemic lupus erythematosus, unspecified: Secondary | ICD-10-CM | POA: Insufficient documentation

## 2020-07-08 LAB — COMPREHENSIVE METABOLIC PANEL
ALT: 20 U/L (ref 0–44)
AST: 17 U/L (ref 15–41)
Albumin: 4.2 g/dL (ref 3.5–5.0)
Alkaline Phosphatase: 62 U/L (ref 38–126)
Anion gap: 11 (ref 5–15)
BUN: 10 mg/dL (ref 6–20)
CO2: 23 mmol/L (ref 22–32)
Calcium: 9 mg/dL (ref 8.9–10.3)
Chloride: 104 mmol/L (ref 98–111)
Creatinine, Ser: 0.42 mg/dL — ABNORMAL LOW (ref 0.44–1.00)
GFR calc Af Amer: >60 mL/min (ref >60)
GFR calc non Af Amer: >60 mL/min (ref >60)
Glucose, Bld: 113 mg/dL — ABNORMAL HIGH (ref 70–99)
Potassium: 3.5 mmol/L (ref 3.5–5.1)
Sodium: 138 mmol/L (ref 135–145)
Total Bilirubin: 0.8 mg/dL (ref 0.3–1.2)
Total Protein: 8.2 g/dL — ABNORMAL HIGH (ref 6.5–8.1)

## 2020-07-08 LAB — CBC WITH DIFFERENTIAL/PLATELET
Abs Immature Granulocytes: 0.04 10*3/uL (ref 0.00–0.07)
Basophils Absolute: 0.1 10*3/uL (ref 0.0–0.1)
Basophils Relative: 1 %
Eosinophils Absolute: 0 10*3/uL (ref 0.0–0.5)
Eosinophils Relative: 0 %
HCT: 42.9 % (ref 36.0–46.0)
Hemoglobin: 14.7 g/dL (ref 12.0–15.0)
Immature Granulocytes: 0 %
Lymphocytes Relative: 39 %
Lymphs Abs: 4.5 10*3/uL — ABNORMAL HIGH (ref 0.7–4.0)
MCH: 31.1 pg (ref 26.0–34.0)
MCHC: 34.3 g/dL (ref 30.0–36.0)
MCV: 90.7 fL (ref 80.0–100.0)
Monocytes Absolute: 0.6 10*3/uL (ref 0.1–1.0)
Monocytes Relative: 5 %
Neutro Abs: 6.5 10*3/uL (ref 1.7–7.7)
Neutrophils Relative %: 55 %
Platelets: 409 10*3/uL — ABNORMAL HIGH (ref 150–400)
RBC: 4.73 MIL/uL (ref 3.87–5.11)
RDW: 14.3 % (ref 11.5–15.5)
WBC: 11.8 10*3/uL — ABNORMAL HIGH (ref 4.0–10.5)
nRBC: 0 % (ref 0.0–0.2)

## 2020-07-08 MED ORDER — KETOROLAC TROMETHAMINE 60 MG/2ML IM SOLN
30.0000 mg | Freq: Once | INTRAMUSCULAR | Status: AC
  Administered 2020-07-08: 30 mg via INTRAMUSCULAR
  Filled 2020-07-08: qty 2

## 2020-07-08 MED ORDER — OXYCODONE-ACETAMINOPHEN 5-325 MG PO TABS
1.0000 | ORAL_TABLET | ORAL | Status: DC | PRN
  Administered 2020-07-08: 1 via ORAL
  Filled 2020-07-08: qty 1

## 2020-07-08 MED ORDER — TIZANIDINE HCL 4 MG PO TABS
4.0000 mg | ORAL_TABLET | Freq: Once | ORAL | Status: AC
  Administered 2020-07-08: 4 mg via ORAL
  Filled 2020-07-08: qty 1

## 2020-07-08 MED ORDER — GABAPENTIN 600 MG PO TABS
300.0000 mg | ORAL_TABLET | Freq: Once | ORAL | Status: AC
  Administered 2020-07-08: 300 mg via ORAL
  Filled 2020-07-08: qty 1

## 2020-07-08 MED ORDER — PREDNISONE 20 MG PO TABS
20.0000 mg | ORAL_TABLET | Freq: Once | ORAL | Status: AC
  Administered 2020-07-08: 20 mg via ORAL
  Filled 2020-07-08: qty 1

## 2020-07-08 MED ORDER — LEFLUNOMIDE 20 MG PO TABS
20.0000 mg | ORAL_TABLET | Freq: Once | ORAL | Status: AC
  Administered 2020-07-08: 20 mg via ORAL
  Filled 2020-07-08: qty 1

## 2020-07-08 MED ORDER — ACETAMINOPHEN 500 MG PO TABS
1000.0000 mg | ORAL_TABLET | Freq: Once | ORAL | Status: AC
  Administered 2020-07-08: 1000 mg via ORAL
  Filled 2020-07-08: qty 2

## 2020-07-08 NOTE — ED Provider Notes (Signed)
[facility] Emergency Department Provider Note ____________________________________________   First MD Initiated Contact with Patient 07/08/20 1210     (approximate)  I have reviewed the triage vital signs and the nursing notes.  HISTORY  Chief Complaint Arm Pain   HPI Catherine Giles is a 49 y.o. femalewho presents to the ED for evaluation of hand and foot pain.  Chart review indicates HTN, hypothyroidism, COPD, RA and lupus. Last saw rheumatology last week, noted to be complaining of similar bilateral hand pain.  She is taking leflunomide and currently prescribed prednisone 10 mg daily.     Patient reports waking this morning at her typical time with acute on chronic bilateral hand pain.  She reports this is similar to her typical intermittent pain from RA, but more severe.  She denies take any medications at home prior to arrival, including her normal home medications.  She is reporting 10/10 aching pain to her bilateral hands that is constant and unremitting for the past 4 hours.  She reports the pain is primarily in her right hand and she minimizes the pain to her left hand, though when asked about the severity of her left hand pain she reports 10/10.  She denies any trauma or injuries.  Denies fevers, numbness, headache, chest pain, falls, back pain.  She reports that for the past "couple days" she is "self tapering" her prednisone and has not taken any today, yesterday and only took 5 mg the day before.  She reports having Motrin at home but not taking any NSAIDs to help with her pain.  She did not take her leflunomide or gabapentin today or yesterday as well.  She reports having his medications at home, but reports unhappiness at having so many medications to take regularly.    Past Medical History:  Diagnosis Date  . Arthritis    RA  . COPD (chronic obstructive pulmonary disease) ([place])   . Emphysema of lung ([place])   . Family history of breast cancer     6/21 cancer genetic testing letter sent  . Hypertension   . Lupus ([place])   . Pre-diabetes   . Thyroid disease     Patient Active Problem List   Diagnosis Date Noted  . Acute respiratory failure with hypoxia ([place])   . CAP (community acquired pneumonia) 04/29/2020  . COPD with acute bronchitis ([place]) 04/29/2020  . Vaginal discharge 03/19/2020  . Vaginal stenosis 03/19/2020  . Obesity 03/19/2020  . Insomnia 01/05/2020  . Complex regional pain syndrome I of upper limb 12/17/2019  . Pain of left hand 12/16/2019  . Essential hypertension 12/13/2019  . Centrilobular emphysema ([place]) 12/13/2019  . Elevated hemoglobin A1c 12/13/2019  . Hypothyroid 12/13/2019  . DDD (degenerative disc disease), lumbar 08/26/2019  . Multinodular goiter 08/14/2019  . Chronic left hip pain 05/05/2019  . Osteoarthritis of spine with radiculopathy, cervical region 05/05/2019  . Encounter for long-term (current) use of high-risk medication 02/14/2019  . Fibromyalgia 02/10/2019  . Rheumatoid arthritis involving multiple sites with positive rheumatoid factor ([place]) 02/10/2019  . Undifferentiated connective tissue disease ([place]) 02/10/2019  . Cervical radiculopathy 03/13/2018  . Vitamin D deficiency 10/11/2015    Past Surgical History:  Procedure Laterality Date  . MOUTH SURGERY      Prior to Admission medications   Medication Sig Start Date End Date Taking? Authorizing Provider  albuterol (VENTOLIN HFA) 108 (90 Base) MCG/ACT inhaler Inhale 2 puffs into the lungs every 4 (four) hours as needed.  02/12/19 05/24/20  [provider]  fluticasone (FLONASE) 50 MCG/ACT nasal spray Place 2 sprays into both nostrils daily. 07/01/20   Rodriguez-Southworth, Sunday Spillers, PA-C  gabapentin (NEURONTIN) 300 MG capsule Take 1 capsule (300 mg total) by mouth 3 (three) times daily. 12/16/19   Cannady, Henrine Screws T, NP  ipratropium-albuterol (DUONEB) 0.5-2.5 (3) MG/3ML SOLN Take 3 mLs by nebulization every 6 (six) hours as needed for up  to 7 days. 06/12/20 06/19/20  Danton Clap, PA-C  levothyroxine (SYNTHROID) 50 MCG tablet Take 50 mcg by mouth daily before breakfast.     [provider]  losartan (COZAAR) 100 MG tablet Take 100 mg by mouth daily.     [provider]  predniSONE (DELTASONE) 10 MG tablet One qd x 5 days 07/01/20   Rodriguez-Southworth, Sunday Spillers, PA-C  tiZANidine (ZANAFLEX) 4 MG tablet Take 4 mg by mouth every 6 (six) hours as needed.     [provider]  traMADol (ULTRAM) 50 MG tablet Take 50 mg by mouth every 6 (six) hours as needed.    [provider]  triamcinolone cream (KENALOG) 0.1 % Apply 1 application topically daily as needed. Apply to affected areas twice daily as needed. Avoid Face,Groin and underarms 03/23/20   Brendolyn Patty, MD  umeclidinium-vilanterol [facility] ELLIPTA) 62.5-25 MCG/INH AEPB Inhale 1 puff into the lungs daily in the afternoon.  02/12/19   [provider]  traZODone (DESYREL) 50 MG tablet Take 1 tablet (50 mg total) by mouth at bedtime. 01/05/20 07/01/20  Marnee Guarneri T, NP    Allergies Azithromycin, Penicillins, and Shellfish allergy  Family History  Problem Relation Age of Onset  . Lung cancer Mother   . Diabetes Mother   . Breast cancer Mother 27  . Hypertension Father   . Cancer Maternal Aunt   . Ovarian cancer Maternal Aunt   . Diabetes Maternal Aunt   . Diabetes Maternal Uncle   . Hypertension Maternal Grandmother   . Diabetes Maternal Grandmother     Social History Social History   Tobacco Use  . Smoking status: Current Every Day Smoker    Packs/day: 1.00    Years: 34.00    Pack years: 34.00    Types: Cigarettes  . Smokeless tobacco: Never Used  Vaping Use  . Vaping Use: Never used  Substance Use Topics  . Alcohol use: Yes    Comment: Occ  . Drug use: Not Currently    Review of Systems  Constitutional: No fever/chills Eyes: No visual changes. ENT: No sore throat. Cardiovascular: Denies chest pain. Respiratory:  Denies shortness of breath. Gastrointestinal: No abdominal pain.  No nausea, no vomiting.  No diarrhea.  No constipation. Genitourinary: Negative for dysuria. Musculoskeletal: Negative for back pain.  Positive for atraumatic bilateral hand pain. Skin: Negative for rash. Neurological: Negative for headaches, focal weakness or numbness.  ____________________________________________   PHYSICAL EXAM:  VITAL SIGNS: Vitals:   07/08/20 0838 07/08/20 1051  BP:  (!) 131/93  Pulse: (!) 113 100  Resp: 18 20  Temp: 98.4 F (36.9 C) 97.9 F (36.6 C)  SpO2: 98% 91%      Constitutional: Alert and oriented. Well appearing and in no acute distress. Eyes: Conjunctivae are normal. PERRL. EOMI. Head: Atraumatic. Nose: No congestion/rhinnorhea. Mouth/Throat: Mucous membranes are moist.  Oropharynx non-erythematous. Neck: No stridor. No cervical spine tenderness to palpation. Cardiovascular: Normal rate, regular rhythm. Grossly normal heart sounds.  Good peripheral circulation. Respiratory: Normal respiratory effort.  No retractions. Lungs CTAB. Gastrointestinal: Soft , nondistended, nontender to  palpation. No abdominal bruits. No CVA tenderness. Musculoskeletal: No lower extremity tenderness nor edema.  No joint effusions. No signs of acute trauma. Bilateral hands without overlying skin changes, signs of trauma, swelling or external visible signs of pathology.  No focal areas of tenderness, but she is reporting diffuse pain throughout her bilateral hands, fingers and wrists without focal areas.  No swollen digits, no remarkabe areas of tenderness to the flexor tendons of all 10 fingers, no pain with passive extension of all 10 fingers.  No areas of warmth to the hand or fingers, induration or fluctuance.  Brisk capillary refill to bilateral hands, sensation is intact and range of motion is initially inhibited by pain. Upon my reevaluation of the patient, range of motion is intact  passively Neurologic:  Normal speech and language. No gross focal neurologic deficits are appreciated. No gait instability noted. Cranial nerves II through XII intact 5/5 strength and sensation in all 4 extremities Skin:  Skin is warm, dry and intact. No rash noted. Psychiatric: Mood and affect are normal. Speech and behavior are normal.  ____________________________________________   LABS (all labs ordered are listed, but only abnormal results are displayed)  Labs Reviewed  CBC WITH DIFFERENTIAL/PLATELET - Abnormal; Notable for the following components:      Result Value   WBC 11.8 (*)    Platelets 409 (*)    Lymphs Abs 4.5 (*)    All other components within normal limits  COMPREHENSIVE METABOLIC PANEL - Abnormal; Notable for the following components:   Glucose, Bld 113 (*)    Creatinine, Ser 0.42 (*)    Total Protein 8.2 (*)    All other components within normal limits   ____________________________________________  RADIOLOGY  ED MD interpretation: X-ray of right hand reviewed without evidence of acute pathology.  Official radiology report(s): DG Hand Complete Right  Result Date: 07/08/2020 CLINICAL DATA:  Right hand pain without known injury. History of lupus. EXAM: RIGHT HAND - COMPLETE 3+ VIEW COMPARISON:  None. FINDINGS: There is no evidence of fracture or dislocation. There is no evidence of arthropathy or other focal bone abnormality. Soft tissues are unremarkable. IMPRESSION: Negative. Electronically Signed   By: Marijo Conception M.D.   On: 07/08/2020 13:24    ____________________________________________   PROCEDURES and INTERVENTIONS  Procedure(s) performed (including Critical Care):  Procedures  Medications  oxyCODONE-acetaminophen (PERCOCET/ROXICET) 5-325 MG per tablet 1 tablet (1 tablet Oral Given 07/08/20 0846)  leflunomide (ARAVA) tablet 20 mg (20 mg Oral Given 07/08/20 1312)  predniSONE (DELTASONE) tablet 20 mg (20 mg Oral Given 07/08/20 1257)  ketorolac  (TORADOL) injection 30 mg (30 mg Intramuscular Given 07/08/20 1257)  acetaminophen (TYLENOL) tablet 1,000 mg (1,000 mg Oral Given 07/08/20 1257)  gabapentin (NEURONTIN) tablet 300 mg (300 mg Oral Given 07/08/20 1304)  tiZANidine (ZANAFLEX) tablet 4 mg (4 mg Oral Given 07/08/20 1311)    ____________________________________________   MDM / ED COURSE  49 year old woman with history of RA and lupus presenting with bilateral hand pain in the setting of medication noncompliance, without evidence of significant acute pathology, amenable to outpatient management.  Patient tachycardic in triage, likely due to distress and discomfort, resolving after analgesia.  Otherwise normal vital signs on room air.  Exam reassuring without evidence of trauma, flexor tenosynovitis, cellulitis or other infectious etiologies.  Patient is neurovascularly intact with brisk capillary refill.  Her pain is diffuse, poorly localized and likely due to her poor compliance with regular medications.  She reports going multiple days without taking  her controller medications, and is self tapering her prednisone.  I provided the patient her home medications, Toradol and single dose of prednisone 20 with resolution of her pain.  Patient subsequently sleeping and reports improvement of her pain.  We discussed outpatient measures and following up with a rheumatologist and PCP.  I urged compliance with her medications.  We discussed return precautions for the ED.  Patient medically stable for discharge home.  Clinical Course as of Jul 08 1726  Thu Jul 08, 2020  1348 Reassessed.  Patient sleeping comfortably.   [DS]    Clinical Course User Index [DS] Vladimir Crofts, MD     ____________________________________________   FINAL CLINICAL IMPRESSION(S) / ED DIAGNOSES  Final diagnoses:  Bilateral hand pain  SLE (systemic lupus erythematosus related syndrome) ([place])  Rheumatoid arthritis involving both hands, unspecified whether rheumatoid  factor present [facility])     ED Discharge Orders    None       Ellasyn Swilling   Note:  This document was prepared using Dragon voice recognition software and may include unintentional dictation errors.   Vladimir Crofts, MD 07/08/20 1729

## 2020-07-08 NOTE — ED Triage Notes (Signed)
Pt in via EMS from home with c/o pain to her hands and right foot. Pt with hx of lupus and states this is a flare up. Pt with hx of this 5-6 years and controls it at home with gabapentin, 150/100, HR 110

## 2020-07-08 NOTE — Discharge Instructions (Signed)
Please take Tylenol and ibuprofen/Advil for your pain.  It is safe to take them together, or to alternate them every few hours.  Take up to 1000mg  of Tylenol at a time, up to 4 times per day.  Do not take more than 4000 mg of Tylenol in 24 hours.  For ibuprofen, take 400-600 mg, 4-5 times per day.  Please continue your other normal prescription medications at home.  I would recommend to continue the prednisone 10 mg as this will be very helpful for both your lupus and RA.  Continue your leflunomide as well.  Please follow-up with your PCP and rheumatologist, as we discussed.  Return to the ED with any worsening symptoms of pain, inability to use/feel your hands, or fevers with your pain.

## 2020-07-08 NOTE — ED Notes (Signed)
Rainbow was sent to lab. 

## 2020-07-09 DIAGNOSIS — J439 Emphysema, unspecified: Secondary | ICD-10-CM | POA: Diagnosis not present

## 2020-07-14 ENCOUNTER — Other Ambulatory Visit
Admission: RE | Admit: 2020-07-14 | Discharge: 2020-07-14 | Disposition: A | Discharge status: Home / Self Care | Payer: Medicare Other | Source: Ambulatory Visit | Attending: Specialist | Admitting: Specialist

## 2020-07-14 DIAGNOSIS — R5381 Other malaise: Secondary | ICD-10-CM | POA: Insufficient documentation

## 2020-07-14 DIAGNOSIS — R5383 Other fatigue: Secondary | ICD-10-CM | POA: Diagnosis not present

## 2020-07-14 DIAGNOSIS — R0602 Shortness of breath: Secondary | ICD-10-CM | POA: Insufficient documentation

## 2020-07-14 DIAGNOSIS — M0579 Rheumatoid arthritis with rheumatoid factor of multiple sites without organ or systems involvement: Secondary | ICD-10-CM | POA: Diagnosis not present

## 2020-07-14 DIAGNOSIS — M4722 Other spondylosis with radiculopathy, cervical region: Secondary | ICD-10-CM | POA: Diagnosis not present

## 2020-07-14 DIAGNOSIS — Z79899 Other long term (current) drug therapy: Secondary | ICD-10-CM | POA: Diagnosis not present

## 2020-07-14 LAB — FIBRIN DERIVATIVES D-DIMER (ARMC ONLY): Fibrin derivatives D-dimer (ARMC): 214.15 ng/mL (FEU) (ref 0.00–499.00)

## 2020-07-19 DIAGNOSIS — M0579 Rheumatoid arthritis with rheumatoid factor of multiple sites without organ or systems involvement: Secondary | ICD-10-CM | POA: Diagnosis not present

## 2020-07-26 ENCOUNTER — Telehealth: Payer: Self-pay | Admitting: Family Medicine

## 2020-07-26 NOTE — Telephone Encounter (Signed)
Copied from [place] [id number]. Topic: General - Inquiry >> Jul 19, 2020 12:37 PM Lennox Solders wrote: Reason for CRM: Pt no longer wants to see jolene requesting to see doctor. Please advise >> Jul 26, 2020  1:53 PM Barth Kirks B wrote: Contacted patient and she advised that her insurance card has Dr. Jeananne Rama' s name on it and she feels she should be able to see a doctor instead of always seeing an NP. Patient states her provider is doing a great job taking care of her, but she has so many problems that she would prefer to have a doctor as her PCP. I informed the patient that we will have a new MD starting on 11/08/20 and we would be happy to transfer once the MD starts. Patient was okay with that and says she is fine remaining with Jolene until then. Patient also stated that she currently has a know that she notices 38-months ago in her lower abdominal area that is painful to the touch. Explained to patient that Jolene does not have any appointments available. Patient understood and requested to see any provider available. Patient agreed to appointment with Kathrine Haddock, FNP on 08/05/20 @ 9am.

## 2020-07-26 NOTE — Telephone Encounter (Signed)
Noted  

## 2020-08-04 ENCOUNTER — Other Ambulatory Visit: Payer: Self-pay

## 2020-08-04 ENCOUNTER — Encounter: Payer: Self-pay | Admitting: Advanced Practice Midwife

## 2020-08-04 ENCOUNTER — Ambulatory Visit (INDEPENDENT_AMBULATORY_CARE_PROVIDER_SITE_OTHER): Payer: Medicare Other | Admitting: Advanced Practice Midwife

## 2020-08-04 ENCOUNTER — Other Ambulatory Visit (HOSPITAL_COMMUNITY)
Admission: RE | Admit: 2020-08-04 | Discharge: 2020-08-04 | Disposition: A | Discharge status: Home / Self Care | Payer: Medicare Other | Source: Ambulatory Visit | Attending: Obstetrics and Gynecology | Admitting: Obstetrics and Gynecology

## 2020-08-04 VITALS — BP 156/93 | HR 90 | Ht 63.0 in | Wt 171.0 lb

## 2020-08-04 DIAGNOSIS — N898 Other specified noninflammatory disorders of vagina: Secondary | ICD-10-CM | POA: Diagnosis present

## 2020-08-04 DIAGNOSIS — Z113 Encounter for screening for infections with a predominantly sexual mode of transmission: Secondary | ICD-10-CM | POA: Insufficient documentation

## 2020-08-04 DIAGNOSIS — M0579 Rheumatoid arthritis with rheumatoid factor of multiple sites without organ or systems involvement: Secondary | ICD-10-CM | POA: Diagnosis not present

## 2020-08-04 MED ORDER — METRONIDAZOLE 500 MG PO TABS: 500.0000 mg | ORAL_TABLET | Freq: Two times a day (BID) | ORAL | 0 refills | Status: AC

## 2020-08-04 NOTE — Progress Notes (Addendum)
Patient ID: Catherine Giles, female   DOB: September 28, 1971, 49 y.o.   MRN: [id number]  Reason for Consult: Vaginal Pain (Hx of vaginosis, foul odor and discharge. )   Subjective:  HPI:  Catherine Giles is a 49 y.o. female being seen for vaginal irritation for the past 2 months. This was about the same time as she resumed sexual relations with her husband. She admits some odor and sometimes brownish/tan discharge. She has pain with intercourse. She would also like to be checked for STDs. She is post-menopausal and denies any vaginal bleeding. She mentions a history of vaginosis that goes back years. She was seen at [facility] about 3 or 4 months ago for similar concerns and was tested at that time. Results were negative at that time.  We discussed the importance of correct vaginal pH for prevention of vaginitis and the use of condoms as needed for prevention of STDs. We also discussed post-menopausal atrophic vaginitis as a possible cause of the vaginal irritation following exam that was not well tolerated.   Past Medical History:  Diagnosis Date  . Arthritis    RA  . COPD (chronic obstructive pulmonary disease) ([place])   . Emphysema of lung ([place])   . Family history of breast cancer    6/21 cancer genetic testing letter sent  . Hypertension   . Lupus ([place])   . Pre-diabetes   . Thyroid disease    Family History  Problem Relation Age of Onset  . Lung cancer Mother   . Diabetes Mother   . Breast cancer Mother 70  . Hypertension Father   . Cancer Maternal Aunt   . Ovarian cancer Maternal Aunt   . Diabetes Maternal Aunt   . Diabetes Maternal Uncle   . Hypertension Maternal Grandmother   . Diabetes Maternal Grandmother    Past Surgical History:  Procedure Laterality Date  . MOUTH SURGERY      Short Social History:  Social History   Tobacco Use  . Smoking status: Current Every Day Smoker    Packs/day: 1.00    Years: 34.00    Pack years: 34.00    Types: Cigarettes  . Smokeless  tobacco: Never Used  Substance Use Topics  . Alcohol use: Yes    Comment: Occ    Allergies  Allergen Reactions  . Azithromycin Anaphylaxis  . Penicillins     Did it involve swelling of the face/tongue/throat, SOB, or low BP? Yes Did it involve sudden or severe rash/hives, skin peeling, or any reaction on the inside of your mouth or nose? No Did you need to seek medical attention at a hospital or doctor's office? No When did it last happen? If all above answers are "NO", may proceed with cephalosporin use.   . Shellfish Allergy     Current Outpatient Medications  Medication Sig Dispense Refill  . fluticasone (FLONASE) 50 MCG/ACT nasal spray Place 2 sprays into both nostrils daily. 16 g 0  . levothyroxine (SYNTHROID) 50 MCG tablet Take 50 mcg by mouth daily before breakfast.     . losartan (COZAAR) 100 MG tablet Take 100 mg by mouth daily.     . predniSONE (DELTASONE) 10 MG tablet One qd x 5 days 15 tablet 0  . tiZANidine (ZANAFLEX) 4 MG tablet Take 4 mg by mouth every 6 (six) hours as needed.     . traMADol (ULTRAM) 50 MG tablet Take 50 mg by mouth every 6 (six) hours as needed.    . triamcinolone cream (  KENALOG) 0.1 % Apply 1 application topically daily as needed. Apply to affected areas twice daily as needed. Avoid Face,Groin and underarms 453 g 1  . umeclidinium-vilanterol (ANORO ELLIPTA) 62.5-25 MCG/INH AEPB Inhale 1 puff into the lungs daily in the afternoon.     Marland Kitchen albuterol (VENTOLIN HFA) 108 (90 Base) MCG/ACT inhaler Inhale 2 puffs into the lungs every 4 (four) hours as needed.     . chlorhexidine (PERIDEX) 0.12 % solution 15 mLs 2 (two) times daily.    Marland Kitchen gabapentin (NEURONTIN) 100 MG capsule Take by mouth.    . gabapentin (NEURONTIN) 300 MG capsule Take 1 capsule (300 mg total) by mouth 3 (three) times daily. (Patient not taking: Reported on 08/04/2020) 90 capsule 3  . ipratropium-albuterol (DUONEB) 0.5-2.5 (3) MG/3ML SOLN Take 3 mLs by nebulization every 6 (six) hours  as needed for up to 7 days. 360 mL 0  . metroNIDAZOLE (FLAGYL) 500 MG tablet Take 1 tablet (500 mg total) by mouth 2 (two) times daily for 7 days. 14 tablet 0   No current facility-administered medications for this visit.    Review of Systems  Constitutional: Negative for chills and fever.  HENT: Negative for congestion, ear discharge, ear pain, hearing loss, sinus pain and sore throat.   Eyes: Negative for blurred vision and double vision.  Respiratory: Negative for cough, shortness of breath and wheezing.   Cardiovascular: Negative for chest pain, palpitations and leg swelling.  Gastrointestinal: Negative for abdominal pain, blood in stool, constipation, diarrhea, heartburn, melena, nausea and vomiting.  Genitourinary: Negative for dysuria, flank pain, frequency, hematuria and urgency.       Positive for vaginal irritation, odor, discharge. Pain with intercourse.  Musculoskeletal: Negative for back pain, joint pain and myalgias.  Skin: Negative for itching and rash.  Neurological: Negative for dizziness, tingling, tremors, sensory change, speech change, focal weakness, seizures, loss of consciousness, weakness and headaches.  Endo/Heme/Allergies: Negative for environmental allergies. Does not bruise/bleed easily.  Psychiatric/Behavioral: Negative for depression, hallucinations, memory loss, substance abuse and suicidal ideas. The patient is not nervous/anxious and does not have insomnia.         Objective:  Objective   Vitals:   08/04/20 1032  BP: (!) 156/93  Pulse: 90  SpO2: 97%  Weight: 171 lb (77.6 kg)  Height: 5\' 3"  (1.6 m)   Body mass index is 30.29 kg/m. Constitutional: Well nourished, well developed female in no acute distress.  HEENT: normal Skin: Warm and dry.  Cardiovascular: Regular rate and rhythm.   Extremity: no edema  Respiratory: Clear to auscultation bilateral. Normal respiratory effort Neuro: DTRs 2+, Cranial nerves grossly intact Psych: Alert and  Oriented x3. No memory deficits. Normal mood and affect.   Pelvic exam:  is not limited by body habitus, however patient did not tolerate exam- small cotton q-tip was inserted with difficulty EGBUS: within normal limits Vagina: dry, post-menopausal thinning of vaginal walls Cervix: not evaluated   Assessment/Plan:     49 y.o. G9 [id number] with possible BV vs atrophic vaginitis  Aptima: vaginitis/STD Rx Metronidazole For vaginal pH: use apple cider vinegar soak, boric acid suppositories, condoms For vaginal irritation: use sea salt soak, coconut oil for lubrication Return to clinic PRN   [facility] 08/04/2020, 3:42 PM

## 2020-08-04 NOTE — Patient Instructions (Signed)
Atrophic Vaginitis  Atrophic vaginitis is a condition in which the tissues that line the vagina become dry and thin. This condition is most common in women who have stopped having regular menstrual periods (are in menopause). This usually starts when a woman is 45-51 years old. That is the time when a woman's estrogen levels begin to drop (decrease). Estrogen is a female hormone. It helps to keep the tissues of the vagina moist. It stimulates the vagina to produce a clear fluid that lubricates the vagina for sexual intercourse. This fluid also protects the vagina from infection. Lack of estrogen can cause the lining of the vagina to get thinner and dryer. The vagina may also shrink in size. It may become less elastic. Atrophic vaginitis tends to get worse over time as a woman's estrogen level drops. What are the causes? This condition is caused by the normal drop in estrogen that happens around the time of menopause. What increases the risk? Certain conditions or situations may lower a woman's estrogen level, leading to a higher risk for atrophic vaginitis. You are more likely to develop this condition if:  You are taking medicines that block estrogen.  You have had your ovaries removed.  You are being treated for cancer with X-ray (radiation) or medicines (chemotherapy).  You have given birth or are breastfeeding.  You are older than age 50.  You smoke. What are the signs or symptoms? Symptoms of this condition include:  Pain, soreness, or bleeding during sexual intercourse (dyspareunia).  Vaginal burning, irritation, or itching.  Pain or bleeding when a speculum is used in a vaginal exam (pelvic exam).  Having burning pain when passing urine.  Vaginal discharge that is brown or yellow. In some cases, there are no symptoms. How is this diagnosed? This condition is diagnosed by taking a medical history and doing a physical exam. This will include a pelvic exam that checks the  vaginal tissues. Though rare, you may also have other tests, including:  A urine test.  A test that checks the acid balance in your vagina (acid balance test). How is this treated? Treatment for this condition depends on how severe your symptoms are. Treatment may include:  Using an over-the-counter vaginal lubricant before sex.  Using a long-acting vaginal moisturizer.  Using low-dose vaginal estrogen for moderate to severe symptoms that do not respond to other treatments. Options include creams, tablets, and inserts (vaginal rings). Before you use a vaginal estrogen, tell your health care provider if you have a history of: ? Breast cancer. ? Endometrial cancer. ? Blood clots. If you are not sexually active and your symptoms are very mild, you may not need treatment. Follow these instructions at home: Medicines  Take over-the-counter and prescription medicines only as told by your health care provider. Do not use herbal or alternative medicines unless your health care provider says that you can.  Use over-the-counter creams, lubricants, or moisturizers for dryness only as directed by your health care provider. General instructions  If your atrophic vaginitis is caused by menopause, discuss all of your menopause symptoms and treatment options with your health care provider.  Do not douche.  Do not use products that can make your vagina dry. These include: ? Scented feminine sprays. ? Scented tampons. ? Scented soaps.  Vaginal intercourse can help to improve blood flow and elasticity of vaginal tissue. If it hurts to have sex, try using a lubricant or moisturizer just before having intercourse. Contact a health care provider if:    Your discharge looks different than normal.  Your vagina has an unusual smell.  You have new symptoms.  Your symptoms do not improve with treatment.  Your symptoms get worse. Summary  Atrophic vaginitis is a condition in which the tissues that  line the vagina become dry and thin. It is most common in women who have stopped having regular menstrual periods (are in menopause).  Treatment options include using vaginal lubricants and low-dose vaginal estrogen.  Contact a health care provider if your vagina has an unusual smell, or if your symptoms get worse or do not improve after treatment. This information is not intended to replace advice given to you by your health care provider. Make sure you discuss any questions you have with your health care provider. Document Revised: 09/21/2017 Document Reviewed: 07/05/2017 Elsevier Patient Education  2020 Elsevier Inc.  

## 2020-08-05 ENCOUNTER — Ambulatory Visit: Payer: Medicare Other | Admitting: Unknown Physician Specialty

## 2020-08-05 DIAGNOSIS — R739 Hyperglycemia, unspecified: Secondary | ICD-10-CM | POA: Diagnosis not present

## 2020-08-05 DIAGNOSIS — J439 Emphysema, unspecified: Secondary | ICD-10-CM | POA: Diagnosis not present

## 2020-08-05 DIAGNOSIS — R0600 Dyspnea, unspecified: Secondary | ICD-10-CM | POA: Diagnosis not present

## 2020-08-05 DIAGNOSIS — M0579 Rheumatoid arthritis with rheumatoid factor of multiple sites without organ or systems involvement: Secondary | ICD-10-CM | POA: Diagnosis not present

## 2020-08-05 DIAGNOSIS — E039 Hypothyroidism, unspecified: Secondary | ICD-10-CM | POA: Diagnosis not present

## 2020-08-05 DIAGNOSIS — E042 Nontoxic multinodular goiter: Secondary | ICD-10-CM | POA: Diagnosis not present

## 2020-08-06 LAB — CERVICOVAGINAL ANCILLARY ONLY
Bacterial Vaginitis (gardnerella): NEGATIVE
Candida Glabrata: NEGATIVE
Candida Vaginitis: NEGATIVE
Chlamydia: NEGATIVE
Comment: NEGATIVE
Comment: NEGATIVE
Comment: NEGATIVE
Comment: NEGATIVE
Comment: NEGATIVE
Comment: NORMAL
Neisseria Gonorrhea: NEGATIVE
Trichomonas: NEGATIVE

## 2020-08-11 ENCOUNTER — Emergency Department: Payer: Medicare Other

## 2020-08-11 ENCOUNTER — Emergency Department
Admission: EM | Admit: 2020-08-11 | Discharge: 2020-08-11 | Disposition: A | Discharge status: Home / Self Care | Payer: Medicare Other | Attending: Emergency Medicine | Admitting: Emergency Medicine

## 2020-08-11 ENCOUNTER — Encounter: Payer: Self-pay | Admitting: Emergency Medicine

## 2020-08-11 ENCOUNTER — Ambulatory Visit: Payer: Medicare Other | Admitting: Obstetrics and Gynecology

## 2020-08-11 ENCOUNTER — Other Ambulatory Visit: Payer: Self-pay

## 2020-08-11 DIAGNOSIS — R6889 Other general symptoms and signs: Secondary | ICD-10-CM | POA: Diagnosis not present

## 2020-08-11 DIAGNOSIS — J449 Chronic obstructive pulmonary disease, unspecified: Secondary | ICD-10-CM | POA: Insufficient documentation

## 2020-08-11 DIAGNOSIS — I10 Essential (primary) hypertension: Secondary | ICD-10-CM | POA: Insufficient documentation

## 2020-08-11 DIAGNOSIS — R079 Chest pain, unspecified: Secondary | ICD-10-CM | POA: Diagnosis not present

## 2020-08-11 DIAGNOSIS — M79602 Pain in left arm: Secondary | ICD-10-CM | POA: Insufficient documentation

## 2020-08-11 DIAGNOSIS — Z743 Need for continuous supervision: Secondary | ICD-10-CM | POA: Diagnosis not present

## 2020-08-11 DIAGNOSIS — Z79899 Other long term (current) drug therapy: Secondary | ICD-10-CM | POA: Insufficient documentation

## 2020-08-11 DIAGNOSIS — E039 Hypothyroidism, unspecified: Secondary | ICD-10-CM | POA: Diagnosis not present

## 2020-08-11 DIAGNOSIS — M79622 Pain in left upper arm: Secondary | ICD-10-CM | POA: Diagnosis not present

## 2020-08-11 DIAGNOSIS — F17210 Nicotine dependence, cigarettes, uncomplicated: Secondary | ICD-10-CM | POA: Diagnosis not present

## 2020-08-11 DIAGNOSIS — R0789 Other chest pain: Secondary | ICD-10-CM | POA: Diagnosis not present

## 2020-08-11 DIAGNOSIS — I499 Cardiac arrhythmia, unspecified: Secondary | ICD-10-CM | POA: Diagnosis not present

## 2020-08-11 LAB — COMPREHENSIVE METABOLIC PANEL
ALT: 21 U/L (ref 0–44)
AST: 23 U/L (ref 15–41)
Albumin: 4 g/dL (ref 3.5–5.0)
Alkaline Phosphatase: 58 U/L (ref 38–126)
Anion gap: 11 (ref 5–15)
BUN: 10 mg/dL (ref 6–20)
CO2: 25 mmol/L (ref 22–32)
Calcium: 9.2 mg/dL (ref 8.9–10.3)
Chloride: 104 mmol/L (ref 98–111)
Creatinine, Ser: 0.47 mg/dL (ref 0.44–1.00)
GFR, Estimated: >60 mL/min (ref >60)
Glucose, Bld: 115 mg/dL — ABNORMAL HIGH (ref 70–99)
Potassium: 4 mmol/L (ref 3.5–5.1)
Sodium: 140 mmol/L (ref 135–145)
Total Bilirubin: 0.7 mg/dL (ref 0.3–1.2)
Total Protein: 7.3 g/dL (ref 6.5–8.1)

## 2020-08-11 LAB — TROPONIN I (HIGH SENSITIVITY)
Troponin I (High Sensitivity): 8 ng/L (ref <18)
Troponin I (High Sensitivity): 6 ng/L (ref <18)

## 2020-08-11 MED ORDER — PREDNISONE 10 MG (21) PO TBPK: ORAL_TABLET | ORAL | 0 refills | Status: DC

## 2020-08-11 MED ORDER — PREDNISONE 20 MG PO TABS
60.0000 mg | ORAL_TABLET | Freq: Once | ORAL | Status: AC
  Administered 2020-08-11: 60 mg via ORAL
  Filled 2020-08-11: qty 3

## 2020-08-11 MED ORDER — HALOPERIDOL LACTATE 5 MG/ML IJ SOLN
5.0000 mg | Freq: Once | INTRAMUSCULAR | Status: AC
  Administered 2020-08-11: 5 mg via INTRAMUSCULAR

## 2020-08-11 MED ORDER — HALOPERIDOL LACTATE 5 MG/ML IJ SOLN
5.0000 mg | Freq: Once | INTRAMUSCULAR | Status: DC
  Filled 2020-08-11: qty 1

## 2020-08-11 MED ORDER — KETOROLAC TROMETHAMINE 60 MG/2ML IM SOLN
60.0000 mg | Freq: Once | INTRAMUSCULAR | Status: AC
  Administered 2020-08-11: 60 mg via INTRAMUSCULAR
  Filled 2020-08-11: qty 2

## 2020-08-11 NOTE — ED Notes (Signed)
Pt continues to yell out in the [place]. ED screener apologetic to pt and explained wait time.

## 2020-08-11 NOTE — ED Notes (Addendum)
Pt states she hasn't taken her prednisone since Sunday.  Pt seen last time in the ED for similar situation when she had stopped taking her prednisone. Pt states she is completely out of her prednisone at home.

## 2020-08-11 NOTE — ED Notes (Addendum)
Spoke with Dr Kerman Passey regarding pt's c/o; order obtained for u/s; pt taken to lobby via w/c; pt st that she is leaving and doesn't want to wait; explained to pt protocols and plan of care and voices understanding of sched u/s

## 2020-08-11 NOTE — ED Notes (Signed)
U/s tech attempted to take pt over for procedure but unable to perform at this time due to pt inability to lie still for exam; MD notified

## 2020-08-11 NOTE — ED Notes (Signed)
Pt pacing the lobby and screaming at "family" on phone.  Yelling at staff.

## 2020-08-11 NOTE — ED Triage Notes (Signed)
Pt provided ice pack for arm.

## 2020-08-11 NOTE — ED Notes (Signed)
Patient yelling out in hallway. Patient explained that she should not screaming in hallway and that we will do our best to address her needs. Patient states "you will remember me."

## 2020-08-11 NOTE — ED Notes (Signed)
Pt pulled to triage 1 by this RN, repeat VS and repeat trop obtained by this RN at this time. Pt began screaming when this RN stuck patient for blood work. Pt tolerated repeat blood work poorly. Pt taken back to lobby by Effie Berkshire, [employer], apologized for, and explained delay.

## 2020-08-11 NOTE — ED Triage Notes (Signed)
Wait time explained to patient. Pt upset about wait. Will continue to assess.

## 2020-08-11 NOTE — ED Notes (Signed)
Lab up to assist with lab collection. Pt refusing to have labs drawn.

## 2020-08-11 NOTE — ED Provider Notes (Signed)
[facility] Emergency Department Provider Note  ____________________________________________   I have reviewed the triage vital signs and the nursing notes.   HISTORY  Chief Complaint Arm Pain   History limited by: Not Limited   HPI Catherine Giles is a 49 y.o. female who presents to the emergency department today because of concern for left arm pain. The patient states that the pain is severe. It is primarily in her hand but then comes up and goes into the anterior part of her right chest. The patient had similar pain months ago and did have work up through neurosurgery at that time. She states she has not followed up with neurology. The patient denies any recent trauma to her arm. She has been taking gabapentin which has not been effective at alleviating the pain.   Records reviewed. Per medical record review patient has a history of evaluation for cervical radiculopathy with MRI roughly 5 months ago. Neurosurgery recommended follow up with neurology.   Past Medical History:  Diagnosis Date  . Arthritis    RA  . COPD (chronic obstructive pulmonary disease) ([place])   . Emphysema of lung ([place])   . Family history of breast cancer    6/21 cancer genetic testing letter sent  . Hypertension   . Lupus ([place])   . Pre-diabetes   . Thyroid disease     Patient Active Problem List   Diagnosis Date Noted  . Acute respiratory failure with hypoxia ([place])   . CAP (community acquired pneumonia) 04/29/2020  . COPD with acute bronchitis ([place]) 04/29/2020  . Vaginal discharge 03/19/2020  . Vaginal stenosis 03/19/2020  . Obesity 03/19/2020  . Insomnia 01/05/2020  . Complex regional pain syndrome I of upper limb 12/17/2019  . Pain of left hand 12/16/2019  . Essential hypertension 12/13/2019  . Centrilobular emphysema ([place]) 12/13/2019  . Elevated hemoglobin A1c 12/13/2019  . Hypothyroid 12/13/2019  . DDD (degenerative disc disease), lumbar 08/26/2019  . Multinodular  goiter 08/14/2019  . Chronic left hip pain 05/05/2019  . Osteoarthritis of spine with radiculopathy, cervical region 05/05/2019  . Encounter for long-term (current) use of high-risk medication 02/14/2019  . Fibromyalgia 02/10/2019  . Rheumatoid arthritis involving multiple sites with positive rheumatoid factor ([place]) 02/10/2019  . Undifferentiated connective tissue disease ([place]) 02/10/2019  . Cervical radiculopathy 03/13/2018  . Vitamin D deficiency 10/11/2015    Past Surgical History:  Procedure Laterality Date  . MOUTH SURGERY      Prior to Admission medications   Medication Sig Start Date End Date Taking? Authorizing Provider  albuterol (VENTOLIN HFA) 108 (90 Base) MCG/ACT inhaler Inhale 2 puffs into the lungs every 4 (four) hours as needed.  02/12/19 05/24/20  [provider]  chlorhexidine (PERIDEX) 0.12 % solution 15 mLs 2 (two) times daily. 06/03/20   [provider]  fluticasone (FLONASE) 50 MCG/ACT nasal spray Place 2 sprays into both nostrils daily. 07/01/20   Rodriguez[place], Sunday Spillers, PA-C  gabapentin (NEURONTIN) 100 MG capsule Take by mouth. 08/03/20   [provider]  gabapentin (NEURONTIN) 300 MG capsule Take 1 capsule (300 mg total) by mouth 3 (three) times daily. Patient not taking: Reported on 08/04/2020 12/16/19   Marnee Guarneri T, NP  ipratropium-albuterol (DUONEB) 0.5-2.5 (3) MG/3ML SOLN Take 3 mLs by nebulization every 6 (six) hours as needed for up to 7 days. 06/12/20 06/19/20  Danton Clap, PA-C  levothyroxine (SYNTHROID) 50 MCG tablet Take 50 mcg by mouth daily before breakfast.     [provider]  losartan (COZAAR) 100 MG tablet Take 100 mg by mouth daily.     [provider]  metroNIDAZOLE (FLAGYL) 500 MG tablet Take 1 tablet (500 mg total) by mouth 2 (two) times daily for 7 days. 08/04/20 08/11/20  Rod Can, CNM  predniSONE (DELTASONE) 10 MG tablet One qd x 5 days 07/01/20   Rodriguez-[place], Sunday Spillers, PA-C   tiZANidine (ZANAFLEX) 4 MG tablet Take 4 mg by mouth every 6 (six) hours as needed.     [provider]  traMADol (ULTRAM) 50 MG tablet Take 50 mg by mouth every 6 (six) hours as needed.    [provider]  triamcinolone cream (KENALOG) 0.1 % Apply 1 application topically daily as needed. Apply to affected areas twice daily as needed. Avoid Face,Groin and underarms 03/23/20   Brendolyn Patty, MD  umeclidinium-vilanterol [facility] ELLIPTA) 62.5-25 MCG/INH AEPB Inhale 1 puff into the lungs daily in the afternoon.  02/12/19   [provider]  traZODone (DESYREL) 50 MG tablet Take 1 tablet (50 mg total) by mouth at bedtime. 01/05/20 07/01/20  Marnee Guarneri T, NP    Allergies Azithromycin, Penicillins, and Shellfish allergy  Family History  Problem Relation Age of Onset  . Lung cancer Mother   . Diabetes Mother   . Breast cancer Mother 73  . Hypertension Father   . Cancer Maternal Aunt   . Ovarian cancer Maternal Aunt   . Diabetes Maternal Aunt   . Diabetes Maternal Uncle   . Hypertension Maternal Grandmother   . Diabetes Maternal Grandmother     Social History Social History   Tobacco Use  . Smoking status: Current Every Day Smoker    Packs/day: 1.00    Years: 34.00    Pack years: 34.00    Types: Cigarettes  . Smokeless tobacco: Never Used  Vaping Use  . Vaping Use: Never used  Substance Use Topics  . Alcohol use: Yes    Comment: Occ  . Drug use: Not Currently    Review of Systems Constitutional: No fever/chills Eyes: No visual changes. ENT: No sore throat. Cardiovascular: Denies chest pain. Respiratory: Denies shortness of breath. Gastrointestinal: No abdominal pain.  No nausea, no vomiting.  No diarrhea.   Genitourinary: Negative for dysuria. Musculoskeletal: Positive for left arm pain. Skin: Negative for rash. Neurological: Negative for headaches, focal weakness or numbness.  ____________________________________________   PHYSICAL  EXAM:  VITAL SIGNS: ED Triage Vitals  Enc Vitals Group     BP 08/11/20 0535 (!) 162/106     Pulse Rate 08/11/20 0535 (!) 104     Resp 08/11/20 0535 20     Temp --      Temp Source 08/11/20 0535 Oral     SpO2 08/11/20 0535 97 %     Weight 08/11/20 0536 170 lb (77.1 kg)     Height 08/11/20 0536 5\' 3"  (1.6 m)     Head Circumference --      Peak Flow --      Pain Score 08/11/20 0536 10   Constitutional: Alert and oriented.  Eyes: Conjunctivae are normal.  ENT      Head: Normocephalic and atraumatic.      Nose: No congestion/rhinnorhea.      Mouth/Throat: Mucous membranes are moist.      Neck: No stridor. Hematological/Lymphatic/Immunilogical: No cervical lymphadenopathy. Cardiovascular: Normal rate, regular rhythm.  No murmurs, rubs, or gallops.  Respiratory: Normal respiratory effort without tachypnea nor retractions. Breath sounds are clear and equal bilaterally. No  wheezes/rales/rhonchi. Gastrointestinal: Soft and non tender. No rebound. No guarding.  Genitourinary: Deferred Musculoskeletal: Normal range of motion in all extremities. No lower extremity edema. 2+ radial and ulnar pulse in left upper extremity. No swelling or skin color changes.  Neurologic:  Normal speech and language. No gross focal neurologic deficits are appreciated.  Skin:  Skin is warm, dry and intact. No rash noted. Psychiatric: Mood and affect are normal. Speech and behavior are normal. Patient exhibits appropriate insight and judgment.  ____________________________________________    LABS (pertinent positives/negatives)  Trop hs 6 CMP wnl except glu 115  ____________________________________________   EKG  I, Nance Pear, attending physician, personally viewed and interpreted this EKG  EKG Time: 0538 Rate: 108 Rhythm: sinus tachycardia Axis: normal Intervals: qtc 477 QRS: narrow, q waves v1, v2 ST changes: no st elevation Impression: abnormal  ekg  ____________________________________________    RADIOLOGY  [place] LE venous No DVT  ____________________________________________   PROCEDURES  Procedures  ____________________________________________   INITIAL IMPRESSION / ASSESSMENT AND PLAN / ED COURSE  Pertinent labs & imaging results that were available during my care of the patient were reviewed by me and considered in my medical decision making (see chart for details).   Patient presents to the emergency department today because of concern for left arm pain. The patient has had this pain for months and per chart review had seen neurosurgery in the past and was recommended to go to neurology which patient has not done. Did obtain US today to evaluate for possible blood clot which is negative. Patient was able to rest after haldol. Will plan on discharging. Discussed with patient importance of follow up. Will trial the patient on course of steroids.   ____________________________________________   FINAL CLINICAL IMPRESSION(S) / ED DIAGNOSES  Final diagnoses:  Left arm pain     Note: This dictation was prepared with Dragon dictation. Any transcriptional errors that result from this process are unintentional     Nance Pear, MD 08/11/20 1356

## 2020-08-11 NOTE — Discharge Instructions (Addendum)
As we discussed it is very important that you follow up with the neurosurgeon you had seen previously as well as neurology. They will be able to perform further tests and offer therapies we are unable to here in the emergency department. Please seek medical attention for any high fevers, chest pain, shortness of breath, change in behavior, persistent vomiting, bloody stool or any other new or concerning symptoms.

## 2020-08-11 NOTE — ED Notes (Signed)
Pt yelling and cursing out in lobby when this RN out to room another pt. Pt asked to calm down and informed on the wait time and that she has been triaged and orders have been started out in [place]. Pt continuing to yell after asking and attempting to redirect pt. Security made aware of pt behavior.

## 2020-08-11 NOTE — ED Notes (Addendum)
Patient back from [place]. Patient resting comfortably with no signs of distress. Respirations even and unlabored at this time.

## 2020-08-11 NOTE — Progress Notes (Signed)
CH encountered pt. in ED hallway; pt. seemed very upset, sitting up in bed in hallway, weeping openly.  Pt. says she has not been treated well and makes reference to staff talking about her behind her back.  Patient access employee provided phone number for patient experience dept. for pt. to call.  Pt. says she is experiencing intense pain in her hand 'like someone is burning it with a torch', blowing on hand intermittently.  [place] provided opportunity for pt. to voice concerns, engaged in active listening, and prayed for pt.'s pain at her request.  Pt. given papers for discharge but requested pregnisone before she leaves.  Pt. grateful for [facility] visit and seemed calmed and relieved by visit.

## 2020-08-11 NOTE — ED Triage Notes (Signed)
Lab called for assistance with repeat blood work.

## 2020-08-11 NOTE — ED Triage Notes (Addendum)
Pt to ED lobby via w/c, brought in by EMS from home; hx fibromyalgia; c/o left arm pain; pt is moaning and restless; pt st since 5pm yesterday having "burning" to left fingertips traveling to chest; st her hand/wrist are swollen; st hx of same and dx with "flare-up of lupus"; st pain unrelieved by gabapentin and motrin; pt denies any accomp symptoms

## 2020-08-11 NOTE — ED Triage Notes (Signed)
Pt encouraged to get [place] on arm, pt refusing. Will continue to assess.

## 2020-08-18 ENCOUNTER — Ambulatory Visit: Payer: Medicare Other | Admitting: Nurse Practitioner

## 2020-08-18 DIAGNOSIS — J439 Emphysema, unspecified: Secondary | ICD-10-CM | POA: Diagnosis not present

## 2020-08-19 ENCOUNTER — Ambulatory Visit: Payer: Medicare Other | Admitting: Nurse Practitioner

## 2020-08-23 ENCOUNTER — Other Ambulatory Visit: Payer: Self-pay

## 2020-08-23 ENCOUNTER — Encounter: Payer: Self-pay | Admitting: Nurse Practitioner

## 2020-08-23 ENCOUNTER — Ambulatory Visit (INDEPENDENT_AMBULATORY_CARE_PROVIDER_SITE_OTHER): Payer: Medicare Other | Admitting: Nurse Practitioner

## 2020-08-23 VITALS — BP 147/100 | HR 84 | Temp 98.2°F | Wt 172.6 lb

## 2020-08-23 DIAGNOSIS — J432 Centrilobular emphysema: Secondary | ICD-10-CM | POA: Diagnosis not present

## 2020-08-23 DIAGNOSIS — I10 Essential (primary) hypertension: Secondary | ICD-10-CM | POA: Diagnosis not present

## 2020-08-23 DIAGNOSIS — M79601 Pain in right arm: Secondary | ICD-10-CM | POA: Diagnosis not present

## 2020-08-23 DIAGNOSIS — R202 Paresthesia of skin: Secondary | ICD-10-CM | POA: Diagnosis not present

## 2020-08-23 DIAGNOSIS — M25511 Pain in right shoulder: Secondary | ICD-10-CM | POA: Diagnosis not present

## 2020-08-23 DIAGNOSIS — M4722 Other spondylosis with radiculopathy, cervical region: Secondary | ICD-10-CM

## 2020-08-23 DIAGNOSIS — R200 Anesthesia of skin: Secondary | ICD-10-CM | POA: Diagnosis not present

## 2020-08-23 DIAGNOSIS — M0579 Rheumatoid arthritis with rheumatoid factor of multiple sites without organ or systems involvement: Secondary | ICD-10-CM

## 2020-08-23 DIAGNOSIS — M79602 Pain in left arm: Secondary | ICD-10-CM | POA: Diagnosis not present

## 2020-08-23 DIAGNOSIS — N644 Mastodynia: Secondary | ICD-10-CM

## 2020-08-23 DIAGNOSIS — L0231 Cutaneous abscess of buttock: Secondary | ICD-10-CM

## 2020-08-23 MED ORDER — TIZANIDINE HCL 4 MG PO TABS: 4.0000 mg | ORAL_TABLET | Freq: Three times a day (TID) | ORAL | 0 refills | Status: DC | PRN

## 2020-08-23 MED ORDER — MUPIROCIN CALCIUM 2 % EX CREA: 1.0000 "application " | TOPICAL_CREAM | Freq: Two times a day (BID) | CUTANEOUS | 0 refills | Status: DC

## 2020-08-23 NOTE — Progress Notes (Signed)
BP (!) 147/100   Pulse 84   Temp 98.2 F (36.8 C) (Oral)   Wt 172 lb 9.6 oz (78.3 kg)   SpO2 96%   BMI 30.57 kg/m    Subjective:    Patient ID: Catherine Giles, female    DOB: Jan 20, 1971, 49 y.o.   MRN: [id number]  HPI: Catherine Giles is a 49 y.o. female presenting with multiple concerns.  Chief Complaint  Patient presents with  . Skin Problem    pt states she had been having "skin tears" on her bottom. States she has noticed these for the last couple of months, are painful when they are there   . Hypertension    pt states that she feels like her BP is dropping to low when she takes Losartan every day   . Home Health    pt states she has been needing help at home, states because of her pain she is needing help at home doing ADL's   SKIN INFECTION Patient notices spots - "blisters" that come and go on her bottom.  They start as blisters and then "busts" when she wipes it.  She feels like her skin is wiping off when that happens.  They are leaving scars and this is bothersome to her. Duration: months Location: left and right side History of trauma in area: no Pain: yes Quality: burning Severity: moderate Redness: no Swelling: no Oozing: yes Pus: yes Fevers: no Nausea/vomiting: no  Diarrhea: no change Status: stable Treatments attempted: tried cocoa butter, shea butter, Tylenol, ibuprofen Tetanus: UTD  ARM PAIN Patient reports constant numbness/tingling in left hand, right arm/shoulder recently flared up and could not move it.  Saw Neurology today and changed gabapentin to Lyrica.  Going to be seeing Orthopedist and doing nerve testing.  She is needing help at home.  She is unable to remember to take her medications, cannot perform ADLs by herself including bathing, and dressing, and has trouble remembering her appointments.  Lives with three children, 1 of whom is trying to move out.  Husband works and is not there all of the time.   Duration: chronic Location: both  arms Mechanism of injury: unknown Onset: constant Severity: severe  Quality:  Sharp, shooting Frequency: constant Radiation: yes Aggravating factors: movement Alleviating factors: sleep   Status: worse Treatments attempted: gabapentin, sleep,    Relief with NSAIDs?:  mild Swelling: yes Redness: no  Warmth: no Trauma: no Chest pain: no  Shortness of breath: no  Fever: no Decreased sensation: yes Paresthesias: yes Weakness: yes  HYPERTENSION Was previously taking Losartan 100 mg daily and was tolerating it well.  Ran out on the past couple of weeks.  Hypertension status: uncontrolled  Satisfied with current treatment? yes Duration of hypertension: chronic BP monitoring frequency:  weekly BP range: 140-150/80s;  BP medication side effects:  no Medication compliance: excellent compliance Previous BP meds: losartan Aspirin: no Recurrent headaches: no Visual changes: no Palpitations: no Dyspnea: no Chest pain: no Lower extremity edema: no Dizzy/lightheaded: no  BREAST PAIN Duration :weeks Location: bilateral Onset: gradual Severity: moderate Quality: aching Frequency: intermittent Redness: no Swelling: no Trauma: no trauma Breastfeeding: no Associated with menstral cycle: no Nipple discharge: no Breast lump: no Status: fluctuating Treatments attempted: zanaflex Previous mammogram: yes   Allergies  Allergen Reactions  . Azithromycin Anaphylaxis  . Penicillins     Did it involve swelling of the face/tongue/throat, SOB, or low BP? Yes Did it involve sudden or severe rash/hives, skin peeling, or any reaction  on the inside of your mouth or nose? No Did you need to seek medical attention at a hospital or doctor's office? No When did it last happen? If all above answers are "NO", may proceed with cephalosporin use.   . Shellfish Allergy    Outpatient Encounter Medications as of 08/23/2020  Medication Sig  . chlorhexidine (PERIDEX) 0.12 % solution 15  mLs 2 (two) times daily.  . fluticasone (FLONASE) 50 MCG/ACT nasal spray Place 2 sprays into both nostrils daily.  Marland Kitchen levothyroxine (SYNTHROID) 50 MCG tablet Take 50 mcg by mouth daily before breakfast.   . liothyronine (CYTOMEL) 5 MCG tablet Take 5 mcg by mouth daily.  Marland Kitchen losartan (COZAAR) 100 MG tablet Take 100 mg by mouth daily.   . predniSONE (DELTASONE) 50 MG tablet Take 50 mg by mouth daily with breakfast.  . pregabalin (LYRICA) 50 MG capsule Take 50 mg by mouth 3 (three) times daily.  Marland Kitchen triamcinolone cream (KENALOG) 0.1 % Apply 1 application topically daily as needed. Apply to affected areas twice daily as needed. Avoid Face,Groin and underarms  . umeclidinium-vilanterol (ANORO ELLIPTA) 62.5-25 MCG/INH AEPB Inhale 1 puff into the lungs daily in the afternoon.   Marland Kitchen albuterol (VENTOLIN HFA) 108 (90 Base) MCG/ACT inhaler Inhale 2 puffs into the lungs every 4 (four) hours as needed.   Marland Kitchen ipratropium-albuterol (DUONEB) 0.5-2.5 (3) MG/3ML SOLN Take 3 mLs by nebulization every 6 (six) hours as needed for up to 7 days.  . mupirocin cream (BACTROBAN) 2 % Apply 1 application topically 2 (two) times daily. Apply on open sores to buttocks as needed.  Marland Kitchen tiZANidine (ZANAFLEX) 4 MG tablet Take 1 tablet (4 mg total) by mouth every 8 (eight) hours as needed for muscle spasms.  . traMADol (ULTRAM) 50 MG tablet Take 50 mg by mouth every 6 (six) hours as needed. (Patient not taking: Reported on 08/23/2020)  . [DISCONTINUED] gabapentin (NEURONTIN) 100 MG capsule Take by mouth.  . [DISCONTINUED] gabapentin (NEURONTIN) 300 MG capsule Take 1 capsule (300 mg total) by mouth 3 (three) times daily. (Patient not taking: Reported on 08/04/2020)  . [DISCONTINUED] predniSONE (DELTASONE) 10 MG tablet One qd x 5 days  . [DISCONTINUED] predniSONE (STERAPRED UNI-PAK 21 TAB) 10 MG (21) TBPK tablet Per packaging instructions  . [DISCONTINUED] tiZANidine (ZANAFLEX) 4 MG tablet Take 4 mg by mouth every 6 (six) hours as needed.   (Patient not taking: Reported on 08/23/2020)  . [DISCONTINUED] traZODone (DESYREL) 50 MG tablet Take 1 tablet (50 mg total) by mouth at bedtime.   No facility-administered encounter medications on file as of 08/23/2020.   Patient Active Problem List   Diagnosis Date Noted  . Cutaneous abscess of buttock 08/24/2020  . Breast pain, right 08/24/2020  . Breast pain, left 08/24/2020  . Right arm pain 08/24/2020  . Acute respiratory failure with hypoxia ([place])   . CAP (community acquired pneumonia) 04/29/2020  . COPD with acute bronchitis ([place]) 04/29/2020  . Vaginal discharge 03/19/2020  . Vaginal stenosis 03/19/2020  . Obesity 03/19/2020  . Insomnia 01/05/2020  . Complex regional pain syndrome I of upper limb 12/17/2019  . Pain of left hand 12/16/2019  . Essential hypertension 12/13/2019  . Centrilobular emphysema ([place]) 12/13/2019  . Elevated hemoglobin A1c 12/13/2019  . Hypothyroid 12/13/2019  . DDD (degenerative disc disease), lumbar 08/26/2019  . Multinodular goiter 08/14/2019  . Chronic left hip pain 05/05/2019  . Osteoarthritis of spine with radiculopathy, cervical region 05/05/2019  . Encounter for long-term (current) use of high-risk  medication 02/14/2019  . Fibromyalgia 02/10/2019  . Rheumatoid arthritis involving multiple sites with positive rheumatoid factor ([place]) 02/10/2019  . Undifferentiated connective tissue disease ([place]) 02/10/2019  . Cervical radiculopathy 03/13/2018  . Vitamin D deficiency 10/11/2015   Past Medical History:  Diagnosis Date  . Arthritis    RA  . COPD (chronic obstructive pulmonary disease) ([place])   . Emphysema of lung ([place])   . Family history of breast cancer    6/21 cancer genetic testing letter sent  . Hypertension   . Lupus ([place])   . Pre-diabetes   . Thyroid disease    Relevant past medical, surgical, family and social history reviewed and updated as indicated. Interim medical history since our last visit reviewed.  Review of Systems   Constitutional: Positive for activity change. Negative for appetite change.  Eyes: Negative.  Negative for visual disturbance.  Respiratory: Negative.   Cardiovascular: Negative.   Gastrointestinal: Negative.   Musculoskeletal: Positive for arthralgias, back pain, joint swelling and myalgias.  Skin: Positive for color change and wound.  Neurological: Negative.   Psychiatric/Behavioral: Negative.     Per HPI unless specifically indicated above     Objective:    BP (!) 147/100   Pulse 84   Temp 98.2 F (36.8 C) (Oral)   Wt 172 lb 9.6 oz (78.3 kg)   SpO2 96%   BMI 30.57 kg/m   Wt Readings from Last 3 Encounters:  08/23/20 172 lb 9.6 oz (78.3 kg)  08/11/20 170 lb (77.1 kg)  08/04/20 171 lb (77.6 kg)    Physical Exam Vitals and nursing note reviewed. Exam conducted with a chaperone present.  Constitutional:      General: She is not in acute distress.    Appearance: Normal appearance. She is not toxic-appearing.     Comments: Uncomfortable-appearing  Cardiovascular:     Rate and Rhythm: Normal rate and regular rhythm.     Heart sounds: Normal heart sounds. No murmur heard.   Pulmonary:     Effort: Pulmonary effort is normal.     Breath sounds: Normal breath sounds. No wheezing, rhonchi or rales.  Chest:     Breasts:        Right: Tenderness present.        Left: Tenderness present.     Comments: Diffuse tenderness to bilateral breasts and axilla; no masses or lumps appreciated on examination Abdominal:     General: Abdomen is flat. There is no distension.     Palpations: Abdomen is soft.     Tenderness: There is no abdominal tenderness.  Musculoskeletal:     Right shoulder: Tenderness and bony tenderness present. No swelling. Decreased range of motion. Decreased strength.     Left shoulder: No swelling. Normal range of motion.     Cervical back: Normal range of motion.     Right lower leg: No edema.     Left lower leg: No edema.  Skin:    Capillary Refill:  Capillary refill takes less than 2 seconds.     Comments: Circular scarring to bilateral lateral buttocks; no fluctuance to areas or areas appearing as actively infected  Neurological:     Mental Status: She is alert and oriented to person, place, and time.     Gait: Gait normal.  Psychiatric:        Mood and Affect: Mood normal.        Behavior: Behavior normal.        Thought Content: Thought content normal.  Judgment: Judgment normal.        Assessment & Plan:   Problem List Items Addressed This Visit      Cardiovascular and Mediastinum   Essential hypertension - Primary    Chronic, ongoing.  BP above goal in clinic today.  Unclear if taking losartan daily and worried that blood pressure will drop too low if she takes it consistently.  Discussed this at length.  Reiterated importance of taking medication daily to prevent high blood pressure.  Encouraged checking BP at home and if lower than 100/60, then will need to make adjustments.  Otherwise, as long as <130/80, that is great.  Return in 3 weeks for recheck.      Relevant Orders   Referral to Chronic Care Management Services     Respiratory   Centrilobular emphysema (HCC)    Chronic, ongoing.  Continue to follow with Pulmonology and per their recommendations.        Relevant Medications   predniSONE (DELTASONE) 50 MG tablet   Other Relevant Orders   Referral to Chronic Care Management Services     Nervous and Auditory   Osteoarthritis of spine with radiculopathy, cervical region    Chronic, ongoing.  Unclear what is causing right arm pain but may be related to this radiculopathy.  Following closely with Neurology - continue this collaboration and per their recommendations.        Relevant Medications   pregabalin (LYRICA) 50 MG capsule   predniSONE (DELTASONE) 50 MG tablet   tiZANidine (ZANAFLEX) 4 MG tablet   Other Relevant Orders   Referral to Chronic Care Management Services     Musculoskeletal and  Integument   Rheumatoid arthritis involving multiple sites with positive rheumatoid factor (HCC)   Relevant Medications   predniSONE (DELTASONE) 50 MG tablet   tiZANidine (ZANAFLEX) 4 MG tablet   Other Relevant Orders   Referral to Chronic Care Management Services   Cutaneous abscess of buttock    Acute ongoing.  Unclear etiology, although likely folliculitis vs. hidradenitis supparativa.  No active boils/lesions today.  Will treat with bactroban as needed for open areas.  If persist or worsen, consider referral to Dermatology.        Other   Breast pain, right    Acute, ongoing.  With pain to bilateral breasts with light palpation, however no discrete masses felt.  ?Musculoskeletal.  Refill for tizanidine given. Seems to have low threshold for pain.  Will place orders for bilateral mammogram with bilateral ultrasound today given reports of significant pain.      Relevant Orders   MM DIAG BREAST TOMO BILATERAL   US BREAST COMPLETE UNI RIGHT INC AXILLA   Breast pain, left    Acute, ongoing.  With pain to bilateral breasts with light palpation, however no discrete masses felt.  ?Musculoskeletal.  Refill for tizanidine given. Seems to have low threshold for pain.  Will place orders for bilateral mammogram with bilateral ultrasound today given reports of significant pain.      Relevant Orders   US BREAST COMPLETE UNI LEFT INC AXILLA   Right arm pain    Acute, ongoing.  Unclear etiology, although likely related to fibromyalgia, Rheumatoid Arthritis, or osteoarthritis.  Currently following with Neurology for same and changed from gabapentin to Lyrica today.  Continue this collaboration with Neurology.  Will place referral to home health for help in her home - unable to perform ADLs, remember medications, or make it to appointments.  Will also consult our CCM  team for ongoing management of chronic complex disease.       Relevant Orders   Ambulatory referral to [facility]       Follow up  plan: Return in about 3 weeks (around 09/13/2020) for CPE with fasting labs.   Time: 40+ minutes spent with the patient today in direct patient care and coordination of care.

## 2020-08-23 NOTE — Patient Instructions (Signed)
DASH Eating Plan DASH stands for "Dietary Approaches to Stop Hypertension." The DASH eating plan is a healthy eating plan that has been shown to reduce high blood pressure (hypertension). It may also reduce your risk for type 2 diabetes, heart disease, and stroke. The DASH eating plan may also help with weight loss. What are tips for following this plan?  General guidelines  Avoid eating more than 2,300 mg (milligrams) of salt (sodium) a day. If you have hypertension, you may need to reduce your sodium intake to 1,500 mg a day.  Limit alcohol intake to no more than 1 drink a day for nonpregnant women and 2 drinks a day for men. One drink equals 12 oz of beer, 5 oz of wine, or 1 oz of hard liquor.  Work with your health care provider to maintain a healthy body weight or to lose weight. Ask what an ideal weight is for you.  Get at least 30 minutes of exercise that causes your heart to beat faster (aerobic exercise) most days of the week. Activities may include walking, swimming, or biking.  Work with your health care provider or diet and nutrition specialist (dietitian) to adjust your eating plan to your individual calorie needs. Reading food labels   Check food labels for the amount of sodium per serving. Choose foods with less than 5 percent of the Daily Value of sodium. Generally, foods with less than 300 mg of sodium per serving fit into this eating plan.  To find whole grains, look for the word "whole" as the first word in the ingredient list. Shopping  Buy products labeled as "low-sodium" or "no salt added."  Buy fresh foods. Avoid canned foods and premade or frozen meals. Cooking  Avoid adding salt when cooking. Use salt-free seasonings or herbs instead of table salt or sea salt. Check with your health care provider or pharmacist before using salt substitutes.  Do not fry foods. Cook foods using healthy methods such as baking, boiling, grilling, and broiling instead.  Cook with  heart-healthy oils, such as olive, canola, soybean, or sunflower oil. Meal planning  Eat a balanced diet that includes: ? 5 or more servings of fruits and vegetables each day. At each meal, try to fill half of your plate with fruits and vegetables. ? Up to 6-8 servings of whole grains each day. ? Less than 6 oz of lean meat, poultry, or fish each day. A 3-oz serving of meat is about the same size as a deck of cards. One egg equals 1 oz. ? 2 servings of low-fat dairy each day. ? A serving of nuts, seeds, or beans 5 times each week. ? Heart-healthy fats. Healthy fats called Omega-3 fatty acids are found in foods such as flaxseeds and coldwater fish, like sardines, salmon, and mackerel.  Limit how much you eat of the following: ? Canned or prepackaged foods. ? Food that is high in trans fat, such as fried foods. ? Food that is high in saturated fat, such as fatty meat. ? Sweets, desserts, sugary drinks, and other foods with added sugar. ? Full-fat dairy products.  Do not salt foods before eating.  Try to eat at least 2 vegetarian meals each week.  Eat more home-cooked food and less restaurant, buffet, and fast food.  When eating at a restaurant, ask that your food be prepared with less salt or no salt, if possible. What foods are recommended? The items listed may not be a complete list. Talk with your dietitian about   what dietary choices are best for you. Grains Whole-grain or whole-wheat bread. Whole-grain or whole-wheat pasta. Brown rice. Oatmeal. Quinoa. Bulgur. Whole-grain and low-sodium cereals. Pita bread. Low-fat, low-sodium crackers. Whole-wheat flour tortillas. Vegetables Fresh or frozen vegetables (raw, steamed, roasted, or grilled). Low-sodium or reduced-sodium tomato and vegetable juice. Low-sodium or reduced-sodium tomato sauce and tomato paste. Low-sodium or reduced-sodium canned vegetables. Fruits All fresh, dried, or frozen fruit. Canned fruit in natural juice (without  added sugar). Meat and other protein foods Skinless chicken or turkey. Ground chicken or turkey. Pork with fat trimmed off. Fish and seafood. Egg whites. Dried beans, peas, or lentils. Unsalted nuts, nut butters, and seeds. Unsalted canned beans. Lean cuts of beef with fat trimmed off. Low-sodium, lean deli meat. Dairy Low-fat (1%) or fat-free (skim) milk. Fat-free, low-fat, or reduced-fat cheeses. Nonfat, low-sodium ricotta or cottage cheese. Low-fat or nonfat yogurt. Low-fat, low-sodium cheese. Fats and oils Soft margarine without trans fats. Vegetable oil. Low-fat, reduced-fat, or light mayonnaise and salad dressings (reduced-sodium). Canola, safflower, olive, soybean, and sunflower oils. Avocado. Seasoning and other foods Herbs. Spices. Seasoning mixes without salt. Unsalted popcorn and pretzels. Fat-free sweets. What foods are not recommended? The items listed may not be a complete list. Talk with your dietitian about what dietary choices are best for you. Grains Baked goods made with fat, such as croissants, muffins, or some breads. Dry pasta or rice meal packs. Vegetables Creamed or fried vegetables. Vegetables in a cheese sauce. Regular canned vegetables (not low-sodium or reduced-sodium). Regular canned tomato sauce and paste (not low-sodium or reduced-sodium). Regular tomato and vegetable juice (not low-sodium or reduced-sodium). Pickles. Olives. Fruits Canned fruit in a light or heavy syrup. Fried fruit. Fruit in cream or butter sauce. Meat and other protein foods Fatty cuts of meat. Ribs. Fried meat. Bacon. Sausage. Bologna and other processed lunch meats. Salami. Fatback. Hotdogs. Bratwurst. Salted nuts and seeds. Canned beans with added salt. Canned or smoked fish. Whole eggs or egg yolks. Chicken or turkey with skin. Dairy Whole or 2% milk, cream, and half-and-half. Whole or full-fat cream cheese. Whole-fat or sweetened yogurt. Full-fat cheese. Nondairy creamers. Whipped toppings.  Processed cheese and cheese spreads. Fats and oils Butter. Stick margarine. Lard. Shortening. Ghee. Bacon fat. Tropical oils, such as coconut, palm kernel, or palm oil. Seasoning and other foods Salted popcorn and pretzels. Onion salt, garlic salt, seasoned salt, table salt, and sea salt. Worcestershire sauce. Tartar sauce. Barbecue sauce. Teriyaki sauce. Soy sauce, including reduced-sodium. Steak sauce. Canned and packaged gravies. Fish sauce. Oyster sauce. Cocktail sauce. Horseradish that you find on the shelf. Ketchup. Mustard. Meat flavorings and tenderizers. Bouillon cubes. Hot sauce and Tabasco sauce. Premade or packaged marinades. Premade or packaged taco seasonings. Relishes. Regular salad dressings. Where to find more information:  National Heart, Lung, and Blood Institute: www.nhlbi.nih.gov  American Heart Association: www.heart.org Summary  The DASH eating plan is a healthy eating plan that has been shown to reduce high blood pressure (hypertension). It may also reduce your risk for type 2 diabetes, heart disease, and stroke.  With the DASH eating plan, you should limit salt (sodium) intake to 2,300 mg a day. If you have hypertension, you may need to reduce your sodium intake to 1,500 mg a day.  When on the DASH eating plan, aim to eat more fresh fruits and vegetables, whole grains, lean proteins, low-fat dairy, and heart-healthy fats.  Work with your health care provider or diet and nutrition specialist (dietitian) to adjust your eating plan to your   individual calorie needs. This information is not intended to replace advice given to you by your health care provider. Make sure you discuss any questions you have with your health care provider. Document Revised: 09/21/2017 Document Reviewed: 10/02/2016 Elsevier Patient Education  2020 Elsevier Inc.  

## 2020-08-24 ENCOUNTER — Ambulatory Visit: Payer: Medicare Other | Admitting: Gastroenterology

## 2020-08-24 DIAGNOSIS — N644 Mastodynia: Secondary | ICD-10-CM | POA: Insufficient documentation

## 2020-08-24 DIAGNOSIS — M79601 Pain in right arm: Secondary | ICD-10-CM | POA: Insufficient documentation

## 2020-08-24 DIAGNOSIS — L0231 Cutaneous abscess of buttock: Secondary | ICD-10-CM | POA: Insufficient documentation

## 2020-08-24 NOTE — Assessment & Plan Note (Signed)
Acute, ongoing.  Unclear etiology, although likely related to fibromyalgia, Rheumatoid Arthritis, or osteoarthritis.  Currently following with Neurology for same and changed from gabapentin to Lyrica today.  Continue this collaboration with Neurology.  Will place referral to home health for help in her home - unable to perform ADLs, remember medications, or make it to appointments.  Will also consult our CCM team for ongoing management of chronic complex disease.

## 2020-08-24 NOTE — Assessment & Plan Note (Addendum)
Chronic, ongoing.  BP above goal in clinic today.  Unclear if taking losartan daily and worried that blood pressure will drop too low if she takes it consistently.  Discussed this at length.  Reiterated importance of taking medication daily to prevent high blood pressure.  Encouraged checking BP at home and if lower than 100/60, then will need to make adjustments.  Otherwise, as long as <130/80, that is great.  Return in 3 weeks for recheck.

## 2020-08-24 NOTE — Assessment & Plan Note (Signed)
Chronic, ongoing.  Unclear what is causing right arm pain but may be related to this radiculopathy.  Following closely with Neurology - continue this collaboration and per their recommendations.

## 2020-08-24 NOTE — Assessment & Plan Note (Signed)
Acute, ongoing.  With pain to bilateral breasts with light palpation, however no discrete masses felt.  ?Musculoskeletal.  Refill for tizanidine given. Seems to have low threshold for pain.  Will place orders for bilateral mammogram with bilateral ultrasound today given reports of significant pain.

## 2020-08-24 NOTE — Assessment & Plan Note (Signed)
Acute ongoing.  Unclear etiology, although likely folliculitis vs. hidradenitis supparativa.  No active boils/lesions today.  Will treat with bactroban as needed for open areas.  If persist or worsen, consider referral to Dermatology.

## 2020-08-24 NOTE — Assessment & Plan Note (Signed)
Chronic, ongoing.  Continue to follow with Pulmonology and per their recommendations.

## 2020-08-26 ENCOUNTER — Telehealth: Payer: Self-pay

## 2020-08-26 ENCOUNTER — Ambulatory Visit: Payer: Self-pay | Admitting: General Practice

## 2020-08-26 DIAGNOSIS — I10 Essential (primary) hypertension: Secondary | ICD-10-CM

## 2020-08-26 DIAGNOSIS — J432 Centrilobular emphysema: Secondary | ICD-10-CM

## 2020-08-26 DIAGNOSIS — J209 Acute bronchitis, unspecified: Secondary | ICD-10-CM

## 2020-08-26 DIAGNOSIS — J440 Chronic obstructive pulmonary disease with acute lower respiratory infection: Secondary | ICD-10-CM

## 2020-08-26 DIAGNOSIS — M797 Fibromyalgia: Secondary | ICD-10-CM

## 2020-08-26 DIAGNOSIS — M0579 Rheumatoid arthritis with rheumatoid factor of multiple sites without organ or systems involvement: Secondary | ICD-10-CM

## 2020-08-26 NOTE — Chronic Care Management (AMB) (Signed)
  Chronic Care Management   Note  08/26/2020 Name: Catherine Giles MRN: [id number] DOB: Feb 03, 1971  Catherine Giles is a 49 y.o. year old female who is a primary care patient of [name], [name], MD. I reached out to [employer] by phone today in response to a referral sent by Catherine Giles's PCP, Noemi Chapel     Catherine Giles was given information about Chronic Care Management services today including:  1. CCM service includes personalized support from designated clinical staff supervised by her physician, including individualized plan of care and coordination with other care providers 2. 24/7 contact phone numbers for assistance for urgent and routine care needs. 3. Service will only be billed when office clinical staff spend 20 minutes or more in a month to coordinate care. 4. Only one practitioner may furnish and bill the service in a calendar month. 5. The patient may stop CCM services at any time (effective at the end of the month) by phone call to the office staff. 6. The patient will be responsible for cost sharing (co-pay) of up to 20% of the service fee (after annual deductible is met).  Patient agreed to services and verbal consent obtained.   Follow up plan: Telephone appointment with care management team member scheduled GDJ:MEQA 09/08/2020 Pharm D 09/10/2020  Noreene Larsson, [place], [place], [place] [address] Direct Dial: [phone] [email or web address]

## 2020-08-26 NOTE — Telephone Encounter (Signed)
Awesome, thank you 

## 2020-08-26 NOTE — Patient Instructions (Signed)
Visit Information  Goals Addressed              This Visit's Progress     RNCM: Pt- "My blood pressure is always high" (pt-stated)        CARE PLAN ENTRY (see longtitudinal plan of care for additional care plan information)  Current Barriers:   Chronic Disease Management support, education, and care coordination needs related to HTN and COPD  Clinical Goal(s) related to HTN and COPD:  Over the next 120 days, patient will:   Work with the care management team to address educational, disease management, and care coordination needs   Begin or continue self health monitoring activities as directed today Measure and record blood pressure 3/4 times per week and adhere to a heart healthy diet  Call provider office for new or worsened signs and symptoms Blood pressure findings outside established parameters, Chest pain, Shortness of breath, and New or worsened symptom related to COPD/HTN or other chronic conditions  Call care management team with questions or concerns  Verbalize basic understanding of patient centered plan of care established today  Interventions related to HTN and COPD:   Evaluation of current treatment plans and patient's adherence to plan as established by provider.  The patient admits she does not always take her medications as prescribed. Education on the risk of heart attack and stroke with elevated blood pressure. The patient states she is trying to make it a priority to take her medications as prescribed.   Assessed patient understanding of disease states.  The patient states she understands her conditions but she deals with chronic pain and discomfort and she does not always take care of her self the way she should  Assessed patient's education and care coordination needs.  Education on taking medications as directed, utilization of a pill box and reminders. Discussed taking blood pressure and recording. Education on following a heart healthy diet.   Provided  disease specific education to patient. Review of the DASH diet and monitoring blood pressures.   Collaborated with appropriate clinical care team members regarding patient needs.  Has referrals in place with LCSW and pharmacist.   Patient Self Care Activities related to HTN and COPD:   Patient is unable to independently self-manage chronic health conditions  Initial goal documentation       RNCM: Pt-"I have extreme pain everyday" (pt-stated)        CARE PLAN ENTRY (see longitudinal plan of care for additional care plan information)  Current Barriers:   Knowledge Deficits related to chronic pain, RA,  and fibromyalgia and how to effectively manage  Care Coordination needs related to food resources, transportation, and help with utilities  in a patient with Chronic pain, RA and fibromyalgia  Chronic Disease Management support and education needs related to uncontrolled chronic pain, RA, and fibromyalgia  Lacks caregiver support. Home alone during the day  Transportation barriers  Non-adherence to scheduled provider appointments  Non-adherence to prescribed medication regimen  Nurse Case Manager Clinical Goal(s):   Over the next 120 days, patient will verbalize understanding of plan for management of uncontrolled pain and discomfort  Over the next 120 days, patient will work with [facility], CCM team, specialist and pcp to address needs related to management of chronic pain, fibromyalgia and RA  Over the next 120 days, patient will demonstrate a decrease in pain exacerbations as evidenced by treatment regimen including RX and therapy to help patient with pain relief of chronic conditions   Over the next  120 days, patient will attend all scheduled medical appointments: next appointment with the pcp on 09-13-2020.   Over the next 120 days, patient will demonstrate improved adherence to prescribed treatment plan for pain management  as evidenced bydecreased level of pain and discomfort  and effective management of pain for chronic pain, RA, and fibromyalgia  Over the next 120 days, patient will work with CM team pharmacist to reconcile medications and help with recommendations for controlling pain  Over the next 120 days, patient will work with CM clinical social worker to depression and anxiety related to dealing with chronic pain and discomfort  Over the next 120 days, patient will work with care guides  (community agency) to help with food resources, transportation and help with utilities  Over the next 120 days, the patient will demonstrate ongoing self health care management ability as evidenced by taking medications as prescribed, working with physical therapy and CCM team to meet her health and wellness goals.   Interventions:   Inter-disciplinary care team collaboration (see longitudinal plan of care)  Evaluation of current treatment plan related to chronic pain, fibromyalgia and RA and patient's adherence to plan as established by provider.  Advised patient to to call the pcp or specialist for increased uncontrolled pain and discomfort.   Provided education to patient re: alternative pain relief methods. The patient was tearful on the call and states that she lives with pain daily. Some days are better than others but she is debilitated by her pain and discomfort. Education on starting therapy soon and the patient is hopeful this will help her.   Reviewed medications with patient and discussed compliance. The patient admits she does not always take her medication as prescribed. Discussed compliance and following recommendations of her providers  Collaborated with CCM team and pcp regarding tearful encounter and the patients stated pain and discomfort on a daily basis  Discussed plans with patient for ongoing care management follow up and provided patient with direct contact information for care management team  Reviewed scheduled/upcoming provider appointments  including: pcp 09-13-2020  Care Guide referral for food resources, transportation resources, and utility assistance  Social Work referral for emotional support, depression, anxiety, therapy in dealing with chronic pain and discomfort.  Appointment on 09-08-2020  Pharmacy referral for medication reconciliation and recommendations. Appointment 09-10-2020  Patient Self Care Activities:   Patient verbalizes understanding of plan to work with the CCM team, pcp, and specialist to manage chronic pain, fibromyalgia, and RA  Self administers medications as prescribed  Attends all scheduled provider appointments  Calls pharmacy for medication refills  Performs IADL's independently  Calls provider office for new concerns or questions  Unable to independently manage pain and discomfort as evidence of uncontrolled pain, tearful on the outreach call today  Does not adhere to provider recommendations re:   Does not attend all scheduled provider appointments  Does not adhere to prescribed medication regimen  Lacks social connections  Initial goal documentation        Ms. Guderian was given information about Chronic Care Management services today including:  1. CCM service includes personalized support from designated clinical staff supervised by her physician, including individualized plan of care and coordination with other care providers 2. 24/7 contact phone numbers for assistance for urgent and routine care needs. 3. Service will only be billed when office clinical staff spend 20 minutes or more in a month to coordinate care. 4. Only one practitioner may furnish and bill the service in a  calendar month. 5. The patient may stop CCM services at any time (effective at the end of the month) by phone call to the office staff. 6. The patient will be responsible for cost sharing (co-pay) of up to 20% of the service fee (after annual deductible is met).  Patient agreed to services and verbal  consent obtained.   Patient verbalizes understanding of instructions provided today.   Telephone follow up appointment with care management team member scheduled for: 10-19-2020 at 12:15 pm  [place], MSN, [facility] Mobile: [phone]

## 2020-08-26 NOTE — Chronic Care Management (AMB) (Signed)
Chronic Care Management   Initial Visit Note  08/26/2020 Name: Catherine Giles MRN: [id number] DOB: Sep 04, 1971  Referred by: Catherine Maple, MD Reason for referral : Chronic Care Management (RNCM Initial Outreach for Chronc Disease Management and Care Coordination Needs )   Catherine Giles is a 49 y.o. year old female who is a primary care patient of [name], [name], MD. The CCM team was consulted for assistance with chronic disease management and care coordination needs related to HTN, COPD and fibromyalgia, RA, and chronic pain  Review of patient status, including review of consultants reports, relevant laboratory and other test results, and collaboration with appropriate care team members and the patient's provider was performed as part of comprehensive patient evaluation and provision of chronic care management services.    SDOH (Social Determinants of Health) assessments performed: Yes See Care Plan activities for detailed interventions related to SDOH     Medications: Outpatient Encounter Medications as of 08/26/2020  Medication Sig  . albuterol (VENTOLIN HFA) 108 (90 Base) MCG/ACT inhaler Inhale 2 puffs into the lungs every 4 (four) hours as needed.   . chlorhexidine (PERIDEX) 0.12 % solution 15 mLs 2 (two) times daily.  . fluticasone (FLONASE) 50 MCG/ACT nasal spray Place 2 sprays into both nostrils daily.  Marland Kitchen ipratropium-albuterol (DUONEB) 0.5-2.5 (3) MG/3ML SOLN Take 3 mLs by nebulization every 6 (six) hours as needed for up to 7 days.  Marland Kitchen levothyroxine (SYNTHROID) 50 MCG tablet Take 50 mcg by mouth daily before breakfast.   . liothyronine (CYTOMEL) 5 MCG tablet Take 5 mcg by mouth daily.  Marland Kitchen losartan (COZAAR) 100 MG tablet Take 100 mg by mouth daily.   . mupirocin cream (BACTROBAN) 2 % Apply 1 application topically 2 (two) times daily. Apply on open sores to buttocks as needed.  . predniSONE (DELTASONE) 50 MG tablet Take 50 mg by mouth daily with breakfast.  . pregabalin (LYRICA)  50 MG capsule Take 50 mg by mouth 3 (three) times daily.  Marland Kitchen tiZANidine (ZANAFLEX) 4 MG tablet Take 1 tablet (4 mg total) by mouth every 8 (eight) hours as needed for muscle spasms.  . traMADol (ULTRAM) 50 MG tablet Take 50 mg by mouth every 6 (six) hours as needed. (Patient not taking: Reported on 08/23/2020)  . triamcinolone cream (KENALOG) 0.1 % Apply 1 application topically daily as needed. Apply to affected areas twice daily as needed. Avoid Face,Groin and underarms  . umeclidinium-vilanterol (ANORO ELLIPTA) 62.5-25 MCG/INH AEPB Inhale 1 puff into the lungs daily in the afternoon.   . [DISCONTINUED] traZODone (DESYREL) 50 MG tablet Take 1 tablet (50 mg total) by mouth at bedtime.   No facility-administered encounter medications on file as of 08/26/2020.     Objective:  BP Readings from Last 3 Encounters:  08/23/20 (!) 147/100  08/11/20 (S) (!) 154/123  08/04/20 (!) 156/93    Goals Addressed              This Visit's Progress   .  RNCM: Pt- "My blood pressure is always high" (pt-stated)        CARE PLAN ENTRY (see longtitudinal plan of care for additional care plan information)  Current Barriers:  . Chronic Disease Management support, education, and care coordination needs related to HTN and COPD  Clinical Goal(s) related to HTN and COPD:  Over the next 120 days, patient will:  . Work with the care management team to address educational, disease management, and care coordination needs  . Begin or continue self health  monitoring activities as directed today Measure and record blood pressure 3/4 times per week and adhere to a heart healthy diet . Call provider office for new or worsened signs and symptoms Blood pressure findings outside established parameters, Chest pain, Shortness of breath, and New or worsened symptom related to COPD/HTN or other chronic conditions . Call care management team with questions or concerns . Verbalize basic understanding of patient centered plan of  care established today  Interventions related to HTN and COPD:  . Evaluation of current treatment plans and patient's adherence to plan as established by provider.  The patient admits she does not always take her medications as prescribed. Education on the risk of heart attack and stroke with elevated blood pressure. The patient states she is trying to make it a priority to take her medications as prescribed.  . Assessed patient understanding of disease states.  The patient states she understands her conditions but she deals with chronic pain and discomfort and she does not always take care of her self the way she should . Assessed patient's education and care coordination needs.  Education on taking medications as directed, utilization of a pill box and reminders. Discussed taking blood pressure and recording. Education on following a heart healthy diet.  . Provided disease specific education to patient. Review of the DASH diet and monitoring blood pressures.  Catherine Giles with appropriate clinical care team members regarding patient needs.  Has referrals in place with LCSW and pharmacist.   Patient Self Care Activities related to HTN and COPD:  . Patient is unable to independently self-manage chronic health conditions  Initial goal documentation     .  RNCM: Pt-"I have extreme pain everyday" (pt-stated)        CARE PLAN ENTRY (see longitudinal plan of care for additional care plan information)  Current Barriers:  Marland Kitchen Knowledge Deficits related to chronic pain, RA,  and fibromyalgia and Giles to effectively manage . Care Coordination needs related to food resources, transportation, and help with utilities  in a patient with Chronic pain, RA and fibromyalgia . Chronic Disease Management support and education needs related to uncontrolled chronic pain, RA, and fibromyalgia . Lacks caregiver support. Home alone during the day . Transportation barriers . Non-adherence to scheduled provider  appointments . Non-adherence to prescribed medication regimen  Nurse Case Manager Clinical Goal(s):  Marland Kitchen Over the next 120 days, patient will verbalize understanding of plan for management of uncontrolled pain and discomfort . Over the next 120 days, patient will work with [facility], [place] team, specialist and pcp to address needs related to management of chronic pain, fibromyalgia and RA . Over the next 120 days, patient will demonstrate a decrease in pain exacerbations as evidenced by treatment regimen including RX and therapy to help patient with pain relief of chronic conditions  . Over the next 120 days, patient will attend all scheduled medical appointments: next appointment with the pcp on 09-13-2020.  Marland Kitchen Over the next 120 days, patient will demonstrate improved adherence to prescribed treatment plan for pain management  as evidenced bydecreased level of pain and discomfort and effective management of pain for chronic pain, RA, and fibromyalgia . Over the next 120 days, patient will work with CM team pharmacist to reconcile medications and help with recommendations for controlling pain . Over the next 120 days, patient will work with CM clinical social worker to depression and anxiety related to dealing with chronic pain and discomfort . Over the next 120 days, patient will  work with care guides  (community agency) to help with food resources, transportation and help with utilities . Over the next 120 days, the patient will demonstrate ongoing self health care management ability as evidenced by taking medications as prescribed, working with physical therapy and CCM team to meet her health and wellness goals.   Interventions:  . Inter-disciplinary care team collaboration (see longitudinal plan of care) . Evaluation of current treatment plan related to chronic pain, fibromyalgia and RA and patient's adherence to plan as established by provider. . Advised patient to to call the pcp or specialist for  increased uncontrolled pain and discomfort.  . Provided education to patient re: alternative pain relief methods. The patient was tearful on the call and states that she lives with pain daily. Some days are better than others but she is debilitated by her pain and discomfort. Education on starting therapy soon and the patient is hopeful this will help her.  . Reviewed medications with patient and discussed compliance. The patient admits she does not always take her medication as prescribed. Discussed compliance and following recommendations of her providers . Collaborated with CCM team and pcp regarding tearful encounter and the patients stated pain and discomfort on a daily basis . Discussed plans with patient for ongoing care management follow up and provided patient with direct contact information for care management team . Reviewed scheduled/upcoming provider appointments including: pcp 09-13-2020 . Care Guide referral for food resources, transportation resources, and utility assistance . Social Work referral for emotional support, depression, anxiety, therapy in dealing with chronic pain and discomfort.  Appointment on 09-08-2020 . Pharmacy referral for medication reconciliation and recommendations. Appointment 09-10-2020  Patient Self Care Activities:  . Patient verbalizes understanding of plan to work with the CCM team, pcp, and specialist to manage chronic pain, fibromyalgia, and RA . Self administers medications as prescribed . Attends all scheduled provider appointments . Calls pharmacy for medication refills . Performs IADL's independently . Calls provider office for new concerns or questions . Unable to independently manage pain and discomfort as evidence of uncontrolled pain, tearful on the outreach call today . Does not adhere to provider recommendations re:  Marland Kitchen Does not attend all scheduled provider appointments . Does not adhere to prescribed medication regimen . Lacks social  connections  Initial goal documentation         Ms. Romain was given information about Chronic Care Management services today including:  1. CCM service includes personalized support from designated clinical staff supervised by her physician, including individualized plan of care and coordination with other care providers 2. 24/7 contact phone numbers for assistance for urgent and routine care needs. 3. Service will only be billed when office clinical staff spend 20 minutes or more in a month to coordinate care. 4. Only one practitioner may furnish and bill the service in a calendar month. 5. The patient may stop CCM services at any time (effective at the end of the month) by phone call to the office staff. 6. The patient will be responsible for cost sharing (co-pay) of up to 20% of the service fee (after annual deductible is met).  Patient agreed to services and verbal consent obtained.   Plan:   Telephone follow up appointment with care management team member scheduled for: 10-19-2020 at 12:15 pm  Noreene Larsson RN, MSN, [facility] Mobile: [phone]

## 2020-08-26 NOTE — Telephone Encounter (Signed)
Copied from [place] [phone]. Topic: General - Other >> Aug 26, 2020 10:52 AM Rainey Pines A wrote: Jackquline Denmark called to inform that Patients in home physical therapy will start on this 11/6

## 2020-08-28 DIAGNOSIS — M0589 Other rheumatoid arthritis with rheumatoid factor of multiple sites: Secondary | ICD-10-CM | POA: Diagnosis not present

## 2020-08-28 DIAGNOSIS — E559 Vitamin D deficiency, unspecified: Secondary | ICD-10-CM | POA: Diagnosis not present

## 2020-08-28 DIAGNOSIS — E042 Nontoxic multinodular goiter: Secondary | ICD-10-CM | POA: Diagnosis not present

## 2020-08-28 DIAGNOSIS — I10 Essential (primary) hypertension: Secondary | ICD-10-CM | POA: Diagnosis not present

## 2020-08-28 DIAGNOSIS — G8929 Other chronic pain: Secondary | ICD-10-CM | POA: Diagnosis not present

## 2020-08-28 DIAGNOSIS — M1990 Unspecified osteoarthritis, unspecified site: Secondary | ICD-10-CM | POA: Diagnosis not present

## 2020-08-28 DIAGNOSIS — Z7952 Long term (current) use of systemic steroids: Secondary | ICD-10-CM | POA: Diagnosis not present

## 2020-08-28 DIAGNOSIS — M5412 Radiculopathy, cervical region: Secondary | ICD-10-CM | POA: Diagnosis not present

## 2020-08-28 DIAGNOSIS — E039 Hypothyroidism, unspecified: Secondary | ICD-10-CM | POA: Diagnosis not present

## 2020-08-28 DIAGNOSIS — J432 Centrilobular emphysema: Secondary | ICD-10-CM | POA: Diagnosis not present

## 2020-08-28 DIAGNOSIS — M797 Fibromyalgia: Secondary | ICD-10-CM | POA: Diagnosis not present

## 2020-08-28 DIAGNOSIS — M5136 Other intervertebral disc degeneration, lumbar region: Secondary | ICD-10-CM | POA: Diagnosis not present

## 2020-08-30 ENCOUNTER — Telehealth: Payer: Self-pay

## 2020-08-30 NOTE — Telephone Encounter (Signed)
    MA11/05/2020 1st Attempt  Name: Beula Joyner   MRN: [id number]   DOB: Jul 29, 1971   AGE: 49 y.o.   GENDER: female   PCP Guadalupe Maple, MD.   08/30/20 Left message on voicemail for patient to return my call regarding resources for Medicaid transportation and utility assistance.  08/27/20 Emailed referral for [employer].  I will attempt to call patient again within the next 7 days.    Salima Rumer, AAS Paralegal, [place] . Embedded Care Coordination [facility]  Care Management  [address], [place] [address] [email or web address]  [phone]  [email or web address]

## 2020-08-31 ENCOUNTER — Telehealth: Payer: Self-pay

## 2020-08-31 ENCOUNTER — Telehealth: Payer: Self-pay | Admitting: Nurse Practitioner

## 2020-08-31 DIAGNOSIS — I10 Essential (primary) hypertension: Secondary | ICD-10-CM | POA: Diagnosis not present

## 2020-08-31 DIAGNOSIS — E042 Nontoxic multinodular goiter: Secondary | ICD-10-CM | POA: Diagnosis not present

## 2020-08-31 DIAGNOSIS — M797 Fibromyalgia: Secondary | ICD-10-CM | POA: Diagnosis not present

## 2020-08-31 DIAGNOSIS — J432 Centrilobular emphysema: Secondary | ICD-10-CM | POA: Diagnosis not present

## 2020-08-31 DIAGNOSIS — Z7952 Long term (current) use of systemic steroids: Secondary | ICD-10-CM | POA: Diagnosis not present

## 2020-08-31 DIAGNOSIS — G8929 Other chronic pain: Secondary | ICD-10-CM | POA: Diagnosis not present

## 2020-08-31 DIAGNOSIS — E039 Hypothyroidism, unspecified: Secondary | ICD-10-CM | POA: Diagnosis not present

## 2020-08-31 DIAGNOSIS — M5136 Other intervertebral disc degeneration, lumbar region: Secondary | ICD-10-CM | POA: Diagnosis not present

## 2020-08-31 DIAGNOSIS — M0589 Other rheumatoid arthritis with rheumatoid factor of multiple sites: Secondary | ICD-10-CM | POA: Diagnosis not present

## 2020-08-31 DIAGNOSIS — M5412 Radiculopathy, cervical region: Secondary | ICD-10-CM | POA: Diagnosis not present

## 2020-08-31 DIAGNOSIS — E559 Vitamin D deficiency, unspecified: Secondary | ICD-10-CM | POA: Diagnosis not present

## 2020-08-31 DIAGNOSIS — M1990 Unspecified osteoarthritis, unspecified site: Secondary | ICD-10-CM | POA: Diagnosis not present

## 2020-08-31 NOTE — Telephone Encounter (Signed)
    MA11/06/2020  Name: Catherine Giles   MRN: [id number]   DOB: 04/02/1971   AGE: 49 y.o.   GENDER: female   PCP Guadalupe Maple, MD.   08/31/20 Spoke with patient let her know I had spoken to Jannifer Franklin at [employer] and she should expect her meal delivery to start on 09/02/20 between 1-4pm.  [place] emailed information for Crisis Intervention Program/utility assistance application and Medicaid transportation.  I will follow-up with patient within the next 7 days.    Vickie Ponds, AAS Paralegal, [place] . Embedded Care Coordination [facility]  Care Management  [address], [place] [address] [email or web address]  [phone]   [email or web address]

## 2020-08-31 NOTE — Telephone Encounter (Signed)
Please call and confirm if patient has been taking losartan or not?  At last visit, she was hesitant to take it because she was fearful her blood pressure would drop too low.  If not taking losartan, she should be.  Would also be curious to know if she was in significant pain during visit; she has been struggling with pain and this could certainly drive up blood pressure.

## 2020-08-31 NOTE — Telephone Encounter (Signed)
Acquanetta Sit, from [facility], called statign that he saw the pt today. He states that the pt had a BP of 161/101. He is requesting to know if he should tell the pt to do anything specific. Please advise.     [phone]

## 2020-09-01 DIAGNOSIS — M1990 Unspecified osteoarthritis, unspecified site: Secondary | ICD-10-CM | POA: Diagnosis not present

## 2020-09-01 DIAGNOSIS — Z7952 Long term (current) use of systemic steroids: Secondary | ICD-10-CM | POA: Diagnosis not present

## 2020-09-01 DIAGNOSIS — G8929 Other chronic pain: Secondary | ICD-10-CM | POA: Diagnosis not present

## 2020-09-01 DIAGNOSIS — M5412 Radiculopathy, cervical region: Secondary | ICD-10-CM | POA: Diagnosis not present

## 2020-09-01 DIAGNOSIS — E039 Hypothyroidism, unspecified: Secondary | ICD-10-CM | POA: Diagnosis not present

## 2020-09-01 DIAGNOSIS — M5136 Other intervertebral disc degeneration, lumbar region: Secondary | ICD-10-CM | POA: Diagnosis not present

## 2020-09-01 DIAGNOSIS — M0589 Other rheumatoid arthritis with rheumatoid factor of multiple sites: Secondary | ICD-10-CM | POA: Diagnosis not present

## 2020-09-01 DIAGNOSIS — E559 Vitamin D deficiency, unspecified: Secondary | ICD-10-CM | POA: Diagnosis not present

## 2020-09-01 DIAGNOSIS — J432 Centrilobular emphysema: Secondary | ICD-10-CM | POA: Diagnosis not present

## 2020-09-01 DIAGNOSIS — M797 Fibromyalgia: Secondary | ICD-10-CM | POA: Diagnosis not present

## 2020-09-01 DIAGNOSIS — E042 Nontoxic multinodular goiter: Secondary | ICD-10-CM | POA: Diagnosis not present

## 2020-09-01 DIAGNOSIS — I10 Essential (primary) hypertension: Secondary | ICD-10-CM | POA: Diagnosis not present

## 2020-09-01 NOTE — Telephone Encounter (Signed)
Called and LVM asking for Bill to please return my call to discuss patient.

## 2020-09-02 ENCOUNTER — Telehealth: Payer: Self-pay

## 2020-09-02 NOTE — Telephone Encounter (Signed)
Called and LVM asking for Bill to please return my call to discuss the patient.

## 2020-09-02 NOTE — Telephone Encounter (Signed)
    MA11/08/2020   Name: Catherine Giles   MRN: [id number]   DOB: 1971/07/02   AGE: 49 y.o.   GENDER: female   PCP Guadalupe Maple, MD.   09/02/20 Spoke with patient she received her first meal delivery from [employer]. Spoke with [employer] and they have funds available for [employer] and [employer], Crisis Intervention Program has run out of funds.  Patient stated that she would call and make an appointment to fill out an application and find out if she is eligible to receive assistance.  Also reminded her to call Medicaid transportation 3 days prior to her appointments to arrange transportation.  No additional resources are needed at this time.  Closing referral. Millie Harl Wiechmann Shaughn Thomley, AAS Paralegal, [place] . Embedded Care Coordination [facility]  Care Management  [address], [place] [address] [email or web address]  [phone]   [email or web address]

## 2020-09-03 NOTE — Telephone Encounter (Signed)
Tried [employer] again. LVM asking for him to please return my call.

## 2020-09-06 DIAGNOSIS — M79601 Pain in right arm: Secondary | ICD-10-CM | POA: Diagnosis not present

## 2020-09-06 DIAGNOSIS — M79602 Pain in left arm: Secondary | ICD-10-CM | POA: Diagnosis not present

## 2020-09-08 ENCOUNTER — Ambulatory Visit: Payer: Medicare Other | Admitting: Licensed Clinical Social Worker

## 2020-09-08 DIAGNOSIS — M797 Fibromyalgia: Secondary | ICD-10-CM

## 2020-09-08 DIAGNOSIS — I10 Essential (primary) hypertension: Secondary | ICD-10-CM

## 2020-09-08 DIAGNOSIS — J440 Chronic obstructive pulmonary disease with acute lower respiratory infection: Secondary | ICD-10-CM

## 2020-09-08 DIAGNOSIS — J209 Acute bronchitis, unspecified: Secondary | ICD-10-CM

## 2020-09-08 DIAGNOSIS — J432 Centrilobular emphysema: Secondary | ICD-10-CM

## 2020-09-08 DIAGNOSIS — M0579 Rheumatoid arthritis with rheumatoid factor of multiple sites without organ or systems involvement: Secondary | ICD-10-CM

## 2020-09-08 NOTE — Chronic Care Management (AMB) (Signed)
Chronic Care Management    Clinical Social Work Follow Up Note  09/08/2020 Name: Catherine Giles MRN: [id number] DOB: 03-09-1971  Catherine Giles is a 49 y.o. year old female who is a primary care patient of [name], [name], MD. The CCM team was consulted for assistance with [name] .   Review of patient status, including review of consultants reports, other relevant assessments, and collaboration with appropriate care team members and the patient's provider was performed as part of comprehensive patient evaluation and provision of chronic care management services.    SDOH (Social Determinants of Health) assessments performed: Yes    Outpatient Encounter Medications as of 09/08/2020  Medication Sig  . albuterol (VENTOLIN HFA) 108 (90 Base) MCG/ACT inhaler Inhale 2 puffs into the lungs every 4 (four) hours as needed.   . chlorhexidine (PERIDEX) 0.12 % solution 15 mLs 2 (two) times daily.  . fluticasone (FLONASE) 50 MCG/ACT nasal spray Place 2 sprays into both nostrils daily.  Marland Kitchen ipratropium-albuterol (DUONEB) 0.5-2.5 (3) MG/3ML SOLN Take 3 mLs by nebulization every 6 (six) hours as needed for up to 7 days.  Marland Kitchen levothyroxine (SYNTHROID) 50 MCG tablet Take 50 mcg by mouth daily before breakfast.   . liothyronine (CYTOMEL) 5 MCG tablet Take 5 mcg by mouth daily.  Marland Kitchen losartan (COZAAR) 100 MG tablet Take 100 mg by mouth daily.   . mupirocin cream (BACTROBAN) 2 % Apply 1 application topically 2 (two) times daily. Apply on open sores to buttocks as needed.  . predniSONE (DELTASONE) 50 MG tablet Take 50 mg by mouth daily with breakfast.  . pregabalin (LYRICA) 50 MG capsule Take 50 mg by mouth 3 (three) times daily.  Marland Kitchen tiZANidine (ZANAFLEX) 4 MG tablet Take 1 tablet (4 mg total) by mouth every 8 (eight) hours as needed for muscle spasms.  . traMADol (ULTRAM) 50 MG tablet Take 50 mg by mouth every 6 (six) hours as needed. (Patient not taking: Reported on 08/23/2020)  . triamcinolone cream  (KENALOG) 0.1 % Apply 1 application topically daily as needed. Apply to affected areas twice daily as needed. Avoid Face,Groin and underarms  . umeclidinium-vilanterol (ANORO ELLIPTA) 62.5-25 MCG/INH AEPB Inhale 1 puff into the lungs daily in the afternoon.   . [DISCONTINUED] traZODone (DESYREL) 50 MG tablet Take 1 tablet (50 mg total) by mouth at bedtime.   No facility-administered encounter medications on file as of 09/08/2020.     Goals Addressed    . SW-Find Help in My Community       Follow Up Date -51 days from 09/08/20   - begin a notebook of services in my neighborhood or community - call 211 when I need some help - follow-up on any referrals for help I am given - think ahead to make sure my need does not become an emergency - make a note about what I need to have by the phone or take with me, like an identification card or social security number have a back-up plan - have a back-up plan - make a list of family or friends that I can call    Why is this important?   Knowing Giles and where to find help for yourself or family in your neighborhood and community is an important skill.  You will want to take some steps to learn Giles.    Notes: [name] has been previously involved for assistance with community resource support. Patient shares that she has been unable to reach [employer] and has contacted them  several times and left messages with no return call. LCSW will send in basket message to [name].     Marland Kitchen SW-Manage My Emotions for Pain Management       Follow Up Date -90 days from 09/08/20   - begin personal counseling - call and visit an old friend - check out volunteer opportunities - join a support group - laugh; watch a funny movie or comedian - learn and use visualization or guided imagery - perform a random act of kindness - practice relaxation or meditation daily - start or continue a personal journal - talk about feelings with a friend, family or spiritual  advisor - practice positive thinking and self-talk    Why is this important?   When you are stressed, down or upset, your body reacts too.  For example, your blood pressure may get higher; you may have a headache or stomachache.  When your emotions get the best of you, your body's ability to fight off cold and flu gets weak.  These steps will help you manage your emotions.     Notes: Pain management coping skill education provided to patient on 09/08/20    . SW-Matintain My Quality of Life       Follow Up Date-90 days from 09/08/20  - check out options for in-home help, long-term care or hospice - complete a living will - discuss my treatment options with the doctor or nurse - do one enjoyable thing every day - do something different, like talking to a new person or going to a new place, every day - learn something new by asking, reading and searching the Internet every day - make an audio or video recording for my loved ones - make shared treatment decisions with doctor - meditate daily - name a health care proxy (decision maker) - share memories using a picture album or scrapbook with my loved ones - spend time with a child every day, borrow one if I have to - spend time outdoors at least 3 times a week - strengthen or fix relationships with loved ones    Why is this important?   Having a long-term illness can be scary.  It can also be stressful for you and your caregiver.  These steps may help.    Notes: Patient has ongoing chronic pain on her left side. She completed a recent visit with her neurologist and will see them again on 09/13/20.     Follow Up Plan: SW will follow up with patient by phone over the next quarter  Eula Fried, BSW, MSW, [email or web address] Phone: [phone]

## 2020-09-10 ENCOUNTER — Telehealth: Payer: Medicare Other

## 2020-09-10 NOTE — Chronic Care Management (AMB) (Deleted)
Chronic Care Management Pharmacy  Name: Catherine Giles  MRN: [id number] DOB: 1971/10/11   Chief Complaint/ HPI  Catherine Giles,  49 y.o. , female presents for their Initial CCM visit with the clinical pharmacist via telephone.  PCP : Guadalupe Maple, MD Patient Care Team: Guadalupe Maple, MD as PCP - General (Family Medicine) Alyce Pagan as [place] [facility] (Licensed Clinical Social Worker) Kenton Kingfisher, Junita Push, [facility] (Pharmacist) Vanita Ingles, RN as Case Manager (General Practice)  Their chronic conditions include: Hypertension, COPD, Hypothyroidism, Osteoarthritis and Elevated A1c and Rheumatoid arthritis   Office Visits: ***  Consult Visit: ***  Allergies  Allergen Reactions  . Azithromycin Anaphylaxis  . Penicillins     Did it involve swelling of the face/tongue/throat, SOB, or low BP? Yes Did it involve sudden or severe rash/hives, skin peeling, or any reaction on the inside of your mouth or nose? No Did you need to seek medical attention at a hospital or doctor's office? No When did it last happen? If all above answers are "NO", may proceed with cephalosporin use.   . Shellfish Allergy     Medications: Outpatient Encounter Medications as of 09/10/2020  Medication Sig  . albuterol (VENTOLIN HFA) 108 (90 Base) MCG/ACT inhaler Inhale 2 puffs into the lungs every 4 (four) hours as needed.   . chlorhexidine (PERIDEX) 0.12 % solution 15 mLs 2 (two) times daily.  . fluticasone (FLONASE) 50 MCG/ACT nasal spray Place 2 sprays into both nostrils daily.  Marland Kitchen ipratropium-albuterol (DUONEB) 0.5-2.5 (3) MG/3ML SOLN Take 3 mLs by nebulization every 6 (six) hours as needed for up to 7 days.  Marland Kitchen levothyroxine (SYNTHROID) 50 MCG tablet Take 50 mcg by mouth daily before breakfast.   . liothyronine (CYTOMEL) 5 MCG tablet Take 5 mcg by mouth daily.  Marland Kitchen losartan (COZAAR) 100 MG tablet Take 100 mg by mouth daily.   . mupirocin cream (BACTROBAN) 2 %  Apply 1 application topically 2 (two) times daily. Apply on open sores to buttocks as needed.  . predniSONE (DELTASONE) 50 MG tablet Take 50 mg by mouth daily with breakfast.  . pregabalin (LYRICA) 50 MG capsule Take 50 mg by mouth 3 (three) times daily.  Marland Kitchen tiZANidine (ZANAFLEX) 4 MG tablet Take 1 tablet (4 mg total) by mouth every 8 (eight) hours as needed for muscle spasms.  . traMADol (ULTRAM) 50 MG tablet Take 50 mg by mouth every 6 (six) hours as needed. (Patient not taking: Reported on 08/23/2020)  . triamcinolone cream (KENALOG) 0.1 % Apply 1 application topically daily as needed. Apply to affected areas twice daily as needed. Avoid Face,Groin and underarms  . umeclidinium-vilanterol (ANORO ELLIPTA) 62.5-25 MCG/INH AEPB Inhale 1 puff into the lungs daily in the afternoon.   . [DISCONTINUED] traZODone (DESYREL) 50 MG tablet Take 1 tablet (50 mg total) by mouth at bedtime.   No facility-administered encounter medications on file as of 09/10/2020.    Wt Readings from Last 3 Encounters:  08/23/20 172 lb 9.6 oz (78.3 kg)  08/11/20 170 lb (77.1 kg)  08/04/20 171 lb (77.6 kg)    Current Diagnosis/Assessment:    Goals Addressed   None     Hypertension   BP goal is:  {CHL HP UPSTREAM Pharmacist BP ranges:2142986905}  Office blood pressures are  BP Readings from Last 3 Encounters:  08/23/20 (!) 147/100  08/11/20 (S) (!) 154/123  08/04/20 (!) 156/93   Patient checks BP at home {CHL HP BP Monitoring Frequency:[phone]}  Patient home BP readings are ranging: ***  Patient has failed these meds in the past: *** Patient is currently {CHL Controlled/Uncontrolled:[phone]} on the following medications:  . Losartan 100 mg   We discussed {CHL HP Upstream Pharmacy discussion:[phone]}  Plan  Continue {CHL HP Upstream Pharmacy Plans:[phone]}    COPD   Last spirometry score: ***  Gold Grade: {CHL HP Upstream Pharm COPD Gold SNKNL:[id number]} Current COPD Classification:   {CHL HP Upstream Pharm COPD Classification:[phone]} No flowsheet data found. Lab Results  Component Value Date/Time   EOSPCT 0 07/08/2020 08:43 AM   EOSABS 0.0 07/08/2020 08:43 AM   EOSABS 0.2 12/16/2019 04:49 PM    Patient has failed these meds in past: *** Patient is currently {CHL Controlled/Uncontrolled:[phone]} on the following medications:  . Albuterol 2 puffs q6h prn . Duoneb? Marland Kitchen Anoro Ellipta 1 puff daily  Using maintenance inhaler regularly? {yes/no:20286} Frequency of rescue inhaler use:  {CHL HP Upstream Pharm Inhaler TKWI:0973532992}  We discussed:  {CHL HP Upstream Pharmacy discussion:[phone]}  Plan  Continue {CHL HP Upstream Pharmacy Plans:[phone]}   Hypothyroidism   Lab Results  Component Value Date/Time   TSH 0.491 04/30/2020 05:41 AM    Patient has failed these meds in past: *** Patient is currently {CHL Controlled/Uncontrolled:[phone]} on the following medications:  . Levothyroxine 50 mcg daily . Liothyronine 5 mcg daily  We discussed:  {CHL HP Upstream Pharmacy discussion:[phone]}  Plan  Continue {CHL HP Upstream Pharmacy Plans:[phone]}  Rheumatoid arthritis   Patient has failed these meds in past: *** Patient is currently {CHL Controlled/Uncontrolled:[phone]} on the following medications:  . Gabapentin 100mg    We discussed:  ***  Plan  Continue {CHL HP Upstream Pharmacy Plans:[phone]}  Osteoarthritis   Patient has failed these meds in past: *** Patient is currently {CHL Controlled/Uncontrolled:[phone]} on the following medications:  . ***  We discussed:  ***  Plan  Continue {CHL HP Upstream Pharmacy EQAST:[id number]}   Medication Management   Patient's preferred pharmacy is:  [facility] [place] [address] [place] [address] Phone: [phone] Fax: [place], [place] [address] Phone:  [phone] Fax: [phone]  Uses pill box? {Yes or If no, why not?:20788} Pt endorses ***% compliance  We discussed: {Pharmacy options:24294}  Plan  {US Pharmacy CHYI:50277}    Follow up: *** month phone visit  ***

## 2020-09-13 ENCOUNTER — Encounter: Payer: Medicare Other | Admitting: Nurse Practitioner

## 2020-09-19 ENCOUNTER — Other Ambulatory Visit: Payer: Self-pay

## 2020-09-19 ENCOUNTER — Ambulatory Visit
Admission: RE | Admit: 2020-09-19 | Discharge: 2020-09-19 | Disposition: A | Discharge status: Home / Self Care | Payer: Medicare Other | Source: Ambulatory Visit | Attending: Physician Assistant | Admitting: Physician Assistant

## 2020-09-19 VITALS — BP 146/95 | HR 106 | Temp 98.1°F | Resp 16 | Ht 63.0 in | Wt 167.0 lb

## 2020-09-19 DIAGNOSIS — R5383 Other fatigue: Secondary | ICD-10-CM

## 2020-09-19 DIAGNOSIS — R058 Other specified cough: Secondary | ICD-10-CM | POA: Diagnosis not present

## 2020-09-19 DIAGNOSIS — J441 Chronic obstructive pulmonary disease with (acute) exacerbation: Secondary | ICD-10-CM

## 2020-09-19 DIAGNOSIS — Z20822 Contact with and (suspected) exposure to covid-19: Secondary | ICD-10-CM | POA: Insufficient documentation

## 2020-09-19 LAB — RESP PANEL BY RT-PCR (FLU A&B, COVID) ARPGX2
Influenza A by PCR: NEGATIVE
Influenza B by PCR: NEGATIVE
SARS Coronavirus 2 by RT PCR: NEGATIVE

## 2020-09-19 MED ORDER — PREDNISONE 20 MG PO TABS: 40.0000 mg | ORAL_TABLET | Freq: Every day | ORAL | 0 refills | Status: AC

## 2020-09-19 MED ORDER — DOXYCYCLINE HYCLATE 100 MG PO CAPS: 100.0000 mg | ORAL_CAPSULE | Freq: Two times a day (BID) | ORAL | 0 refills | Status: AC

## 2020-09-19 MED ORDER — CHERATUSSIN AC 100-10 MG/5ML PO SOLN: 10.0000 mL | Freq: Four times a day (QID) | ORAL | 0 refills | Status: AC | PRN

## 2020-09-19 NOTE — ED Triage Notes (Signed)
Patient c/o cough, chest congestion, bodyaches, and fever that started yesterday.

## 2020-09-19 NOTE — ED Provider Notes (Signed)
[facility]    CSN: [id number] Arrival date & time: 09/19/20  8295      History   Chief Complaint Chief Complaint  Patient presents with  . Cough    HPI Catherine Giles is a 49 y.o. female presenting for onset of cough, nasal and chest congestion, body aches, sore throat, and fever yesterday.  She also admits to temperatures up to 101F. She has been taking Motrin.    Denies known Covid exposure, but did just recently travel out of town to [facility] for Thanksgiving.  Patient is fully vaccinated for Covid.  She has a history of COPD, lupus, RA, and thyroid disease, and prediabetes.  Patient states she believes she is having a flareup of COPD.  She states that her cough is productive.  Has been taking Mucinex.  She states she has felt a little bit more short of breath than normal and has been using her regular inhalers.  Denies any chest pain or tightness.  She denies any weakness.  Patient denies any headaches, ear pain, sinus pain, abdominal pain, nausea/vomiting or diarrhea.  She has no other complaints or concerns at this time.  HPI  Past Medical History:  Diagnosis Date  . Arthritis    RA  . COPD (chronic obstructive pulmonary disease) ([place])   . Emphysema of lung ([place])   . Family history of breast cancer    6/21 cancer genetic testing letter sent  . Hypertension   . Lupus ([place])   . Pre-diabetes   . Thyroid disease     Patient Active Problem List   Diagnosis Date Noted  . Cutaneous abscess of buttock 08/24/2020  . Breast pain, right 08/24/2020  . Breast pain, left 08/24/2020  . Right arm pain 08/24/2020  . Acute respiratory failure with hypoxia ([place])   . CAP (community acquired pneumonia) 04/29/2020  . COPD with acute bronchitis ([place]) 04/29/2020  . Vaginal discharge 03/19/2020  . Vaginal stenosis 03/19/2020  . Obesity 03/19/2020  . Insomnia 01/05/2020  . Complex regional pain syndrome I of upper limb 12/17/2019  . Pain of left hand 12/16/2019  .  Essential hypertension 12/13/2019  . Centrilobular emphysema ([place]) 12/13/2019  . Elevated hemoglobin A1c 12/13/2019  . Hypothyroid 12/13/2019  . DDD (degenerative disc disease), lumbar 08/26/2019  . Multinodular goiter 08/14/2019  . Chronic left hip pain 05/05/2019  . Osteoarthritis of spine with radiculopathy, cervical region 05/05/2019  . Encounter for long-term (current) use of high-risk medication 02/14/2019  . Fibromyalgia 02/10/2019  . Rheumatoid arthritis involving multiple sites with positive rheumatoid factor ([place]) 02/10/2019  . Undifferentiated connective tissue disease ([place]) 02/10/2019  . Cervical radiculopathy 03/13/2018  . Vitamin D deficiency 10/11/2015    Past Surgical History:  Procedure Laterality Date  . MOUTH SURGERY      OB History    Gravida  9   Para  6   Term      Preterm      AB  3   Living        SAB  2   TAB      Ectopic      Multiple      Live Births               Home Medications    Prior to Admission medications   Medication Sig Start Date End Date Taking? Authorizing Provider  fluticasone (FLONASE) 50 MCG/ACT nasal spray Place 2 sprays into both nostrils daily. 07/01/20  Yes Rodriguez-Southworth, Sunday Spillers, PA-C  levothyroxine (SYNTHROID) 50 MCG tablet Take 50 mcg by mouth daily before breakfast.    Yes [provider]  losartan (COZAAR) 100 MG tablet Take 100 mg by mouth daily.    Yes [provider]  pregabalin (LYRICA) 50 MG capsule Take 50 mg by mouth 3 (three) times daily. 08/23/20  Yes [provider]  traMADol (ULTRAM) 50 MG tablet Take 50 mg by mouth every 6 (six) hours as needed.    Yes [provider]  albuterol (VENTOLIN HFA) 108 (90 Base) MCG/ACT inhaler Inhale 2 puffs into the lungs every 4 (four) hours as needed.  02/12/19 05/24/20  [provider]  chlorhexidine (PERIDEX) 0.12 % solution 15 mLs 2 (two) times daily. 06/03/20   [provider]  doxycycline (VIBRAMYCIN)  100 MG capsule Take 1 capsule (100 mg total) by mouth 2 (two) times daily for 7 days. 09/19/20 09/26/20  Danton Clap, PA-C  guaiFENesin-codeine (CHERATUSSIN AC) 100-10 MG/5ML syrup Take 10 mLs by mouth 4 (four) times daily as needed for up to 7 days for cough. 09/19/20 09/26/20  Laurene Footman B, PA-C  ipratropium-albuterol (DUONEB) 0.5-2.5 (3) MG/3ML SOLN Take 3 mLs by nebulization every 6 (six) hours as needed for up to 7 days. 06/12/20 06/19/20  Danton Clap, PA-C  liothyronine (CYTOMEL) 5 MCG tablet Take 5 mcg by mouth daily. 08/05/20   [provider]  mupirocin cream (BACTROBAN) 2 % Apply 1 application topically 2 (two) times daily. Apply on open sores to buttocks as needed. 08/23/20   Eulogio Bear, NP  predniSONE (DELTASONE) 20 MG tablet Take 2 tablets (40 mg total) by mouth daily for 5 days. 09/19/20 09/24/20  Laurene Footman B, PA-C  tiZANidine (ZANAFLEX) 4 MG tablet Take 1 tablet (4 mg total) by mouth every 8 (eight) hours as needed for muscle spasms. 08/23/20   Eulogio Bear, NP  triamcinolone cream (KENALOG) 0.1 % Apply 1 application topically daily as needed. Apply to affected areas twice daily as needed. Avoid Face,Groin and underarms 03/23/20   Brendolyn Patty, MD  umeclidinium-vilanterol [facility] ELLIPTA) 62.5-25 MCG/INH AEPB Inhale 1 puff into the lungs daily in the afternoon.  02/12/19   [provider]  traZODone (DESYREL) 50 MG tablet Take 1 tablet (50 mg total) by mouth at bedtime. 01/05/20 07/01/20  Venita Lick, NP    Family History Family History  Problem Relation Age of Onset  . Lung cancer Mother   . Diabetes Mother   . Breast cancer Mother 66  . Hypertension Father   . Cancer Maternal Aunt   . Ovarian cancer Maternal Aunt   . Diabetes Maternal Aunt   . Diabetes Maternal Uncle   . Hypertension Maternal Grandmother   . Diabetes Maternal Grandmother     Social History Social History   Tobacco Use  . Smoking status: Current Every Day Smoker     Packs/day: 1.00    Years: 34.00    Pack years: 34.00    Types: Cigarettes  . Smokeless tobacco: Never Used  Vaping Use  . Vaping Use: Never used  Substance Use Topics  . Alcohol use: Yes    Comment: Occ  . Drug use: Not Currently     Allergies   Azithromycin, Penicillins, and Shellfish allergy   Review of Systems Review of Systems  Constitutional: Positive for fever. Negative for chills, diaphoresis and fatigue.  HENT: Positive for congestion, rhinorrhea and sore throat. Negative for ear pain, sinus pressure and sinus pain.  Respiratory: Positive for cough. Negative for shortness of breath and wheezing.   Cardiovascular: Negative for chest pain.  Gastrointestinal: Negative for abdominal pain, nausea and vomiting.  Musculoskeletal: Negative for arthralgias and myalgias.  Skin: Negative for rash.  Neurological: Negative for weakness and headaches.  Hematological: Negative for adenopathy.     Physical Exam Triage Vital Signs ED Triage Vitals  Enc Vitals Group     BP 09/19/20 1103 (!) 146/95     Pulse Rate 09/19/20 1103 (!) 106     Resp 09/19/20 1103 16     Temp 09/19/20 1103 98.1 F (36.7 C)     Temp Source 09/19/20 1103 Oral     SpO2 09/19/20 1103 95 %     Weight 09/19/20 1100 167 lb (75.8 kg)     Height 09/19/20 1100 5\' 3"  (1.6 m)     Head Circumference --      Peak Flow --      Pain Score 09/19/20 1100 10     Pain Loc --      Pain Edu? --      Excl. in [place]? --    No data found.  Updated Vital Signs BP (!) 146/95 (BP Location: Right Arm)   Pulse (!) 106   Temp 98.1 F (36.7 C) (Oral)   Resp 16   Ht 5\' 3"  (1.6 m)   Wt 167 lb (75.8 kg)   SpO2 95%   BMI 29.58 kg/m       Physical Exam Vitals and nursing note reviewed.  Constitutional:      General: She is not in acute distress.    Appearance: Normal appearance. She is not ill-appearing or toxic-appearing.  HENT:     Head: Normocephalic and atraumatic.     Nose: Congestion and rhinorrhea  present.     Mouth/Throat:     Mouth: Mucous membranes are moist.     Pharynx: Oropharynx is clear. Posterior oropharyngeal erythema present.  Eyes:     General: No scleral icterus.       Right eye: No discharge.        Left eye: No discharge.     Conjunctiva/sclera: Conjunctivae normal.  Cardiovascular:     Rate and Rhythm: Regular rhythm. Tachycardia present.     Heart sounds: Normal heart sounds.  Pulmonary:     Effort: Pulmonary effort is normal. No respiratory distress.     Breath sounds: Rhonchi (few scattered rhonchi throughout which clear witih cough) present. No wheezing or rales.  Musculoskeletal:     Cervical back: Neck supple.  Skin:    General: Skin is dry.  Neurological:     General: No focal deficit present.     Mental Status: She is alert. Mental status is at baseline.     Motor: No weakness.     Gait: Gait normal.  Psychiatric:        Mood and Affect: Mood normal.        Behavior: Behavior normal.        Thought Content: Thought content normal.      UC Treatments / Results  Labs (all labs ordered are listed, but only abnormal results are displayed) Labs Reviewed  RESP PANEL BY RT-PCR (FLU A&B, COVID) [id number]    EKG   Radiology No results found.  Procedures Procedures (including critical care time)  Medications Ordered in UC Medications - No data to display  Initial Impression / Assessment and Plan / UC Course  I have reviewed the  triage vital signs and the nursing notes.  Pertinent labs & imaging results that were available during my care of the patient were reviewed by me and considered in my medical decision making (see chart for details).   49 year old female presenting for cough, congestion, sore throat and fever starting yesterday.  Denies Covid exposure.  Fully vaccinated for Covid.  Vital signs are stable in clinic.  Patient is in no acute distress.  She is a little bit tachycardic at 106 bpm.  On exam, she has some mild nasal  congestion and posterior pharyngeal erythema.  There are few scattered rhonchi throughout chest which do clear with cough.  No respiratory distress.  Respiratory panel obtained to assess for possible flu and Covid given symptoms.  Advised patient we will call with results later today.  Advised if she is Covid positive I can refer her to the infusion clinic for MAB therapy.  Patient says she is interested.  Treating for COPD exacerbation at this time with prednisone and doxycycline since she has allergy to azithromycin and penicillin drugs.  Advised to continue at home breathing treatments and inhalers.  Advised supportive care with increasing rest and fluids.  I did also send Cheratussin to pharmacy for patient.  Reviewed controlled substance database and patient low risk for abuse.  ED precautions thoroughly reviewed with patient.  Called to advise patient that her respiratory panel is negative for Covid and flu.   Final Clinical Impressions(s) / UC Diagnoses   Final diagnoses:  COPD exacerbation ([place])  Productive cough  Fatigue, unspecified type     Discharge Instructions     You have received COVID testing today either for positive exposure, concerning symptoms that could be related to COVID infection, screening purposes, or re-testing after confirmed positive.  Your test obtained today checks for active viral infection in the last 1-2 weeks. If your test is negative now, you can still test positive later. So, if you do develop symptoms you should either get re-tested and/or isolate x 10 days. Please follow CDC guidelines.  While Rapid antigen tests come back in 15-20 minutes, send out PCR/molecular test results typically come back within 24 hours. In the mean time, if you are symptomatic, assume this could be a positive test and treat/monitor yourself as if you do have COVID.   We will call with test results. Please download the MyChart app and set up a profile to access test results.    If symptomatic, go home and rest. Push fluids. Take Tylenol as needed for discomfort. Gargle warm salt water. Throat lozenges. Take Mucinex DM or Robitussin for cough. Humidifier in bedroom to ease coughing. Warm showers. Also review the COVID handout for more information.  COVID-19 INFECTION: The incubation period of COVID-19 is approximately 14 days after exposure, with most symptoms developing in roughly 4-5 days. Symptoms may range in severity from mild to critically severe. Roughly 80% of those infected will have mild symptoms. People of any age may become infected with COVID-19 and have the ability to transmit the virus. The most common symptoms include: fever, fatigue, cough, body aches, headaches, sore throat, nasal congestion, shortness of breath, nausea, vomiting, diarrhea, changes in smell and/or taste.    COURSE OF ILLNESS Some patients may begin with mild disease which can progress quickly into critical symptoms. If your symptoms are worsening please call ahead to the Emergency Department and proceed there for further treatment. Recovery time appears to be roughly 1-2 weeks for mild symptoms and 3-6 weeks  for severe disease.   GO IMMEDIATELY TO ER FOR FEVER YOU ARE UNABLE TO GET DOWN WITH TYLENOL, BREATHING PROBLEMS, CHEST PAIN, FATIGUE, LETHARGY, INABILITY TO EAT OR DRINK, ETC  QUARANTINE AND ISOLATION: To help decrease the spread of COVID-19 please remain isolated if you have COVID infection or are highly suspected to have COVID infection. This means -stay home and isolate to one room in the home if you live with others. Do not share a bed or bathroom with others while ill, sanitize and wipe down all countertops and keep common areas clean and disinfected. You may discontinue isolation if you have a mild case and are asymptomatic 10 days after symptom onset as long as you have been fever free >24 hours without having to take Motrin or Tylenol. If your case is more severe (meaning you  develop pneumonia or are admitted in the hospital), you may have to isolate longer.   If you have been in close contact (within 6 feet) of someone diagnosed with COVID 19, you are advised to quarantine in your home for 14 days as symptoms can develop anywhere from 2-14 days after exposure to the virus. If you develop symptoms, you  must isolate.  Most current guidelines for COVID after exposure -isolate 10 days if you ARE NOT tested for COVID as long as symptoms do not develop -isolate 7 days if you are tested and remain asymptomatic -You do not necessarily need to be tested for COVID if you have + exposure and        develop   symptoms. Just isolate at home x10 days from symptom onset During this global pandemic, CDC advises to practice social distancing, try to stay at least 69ft away from others at all times. Wear a face covering. Wash and sanitize your hands regularly and avoid going anywhere that is not necessary.  KEEP IN MIND THAT THE COVID TEST IS NOT 100% ACCURATE AND YOU SHOULD STILL DO EVERYTHING TO PREVENT POTENTIAL SPREAD OF VIRUS TO OTHERS (WEAR MASK, WEAR GLOVES, [place] HANDS AND SANITIZE REGULARLY). IF INITIAL TEST IS NEGATIVE, THIS MAY NOT MEAN YOU ARE DEFINITELY NEGATIVE. MOST ACCURATE TESTING IS DONE 5-7 DAYS AFTER EXPOSURE.   It is not advised by CDC to get re-tested after receiving a positive COVID test since you can still test positive for weeks to months after you have already cleared the virus.   *If you have not been vaccinated for COVID, I strongly suggest you consider getting vaccinated as long as there are no contraindications.      ED Prescriptions    Medication Sig Dispense Auth. Provider   predniSONE (DELTASONE) 20 MG tablet Take 2 tablets (40 mg total) by mouth daily for 5 days. 10 tablet Laurene Footman B, PA-C   guaiFENesin-codeine (CHERATUSSIN AC) 100-10 MG/5ML syrup Take 10 mLs by mouth 4 (four) times daily as needed for up to 7 days for cough. 150 mL Laurene Footman  B, PA-C   doxycycline (VIBRAMYCIN) 100 MG capsule Take 1 capsule (100 mg total) by mouth 2 (two) times daily for 7 days. 14 capsule Danton Clap, PA-C     PDMP not reviewed this encounter.   Danton Clap, PA-C 09/20/20 1426

## 2020-09-19 NOTE — Discharge Instructions (Signed)
You have received COVID testing today either for positive exposure, concerning symptoms that could be related to COVID infection, screening purposes, or re-testing after confirmed positive.  Your test obtained today checks for active viral infection in the last 1-2 weeks. If your test is negative now, you can still test positive later. So, if you do develop symptoms you should either get re-tested and/or isolate x 10 days. Please follow CDC guidelines.  While Rapid antigen tests come back in 15-20 minutes, send out PCR/molecular test results typically come back within 24 hours. In the mean time, if you are symptomatic, assume this could be a positive test and treat/monitor yourself as if you do have COVID.   We will call with test results. Please download the MyChart app and set up a profile to access test results.   If symptomatic, go home and rest. Push fluids. Take Tylenol as needed for discomfort. Gargle warm salt water. Throat lozenges. Take Mucinex DM or Robitussin for cough. Humidifier in bedroom to ease coughing. Warm showers. Also review the COVID handout for more information.  COVID-19 INFECTION: The incubation period of COVID-19 is approximately 14 days after exposure, with most symptoms developing in roughly 4-5 days. Symptoms may range in severity from mild to critically severe. Roughly 80% of those infected will have mild symptoms. People of any age may become infected with COVID-19 and have the ability to transmit the virus. The most common symptoms include: fever, fatigue, cough, body aches, headaches, sore throat, nasal congestion, shortness of breath, nausea, vomiting, diarrhea, changes in smell and/or taste.    COURSE OF ILLNESS Some patients may begin with mild disease which can progress quickly into critical symptoms. If your symptoms are worsening please call ahead to the Emergency Department and proceed there for further treatment. Recovery time appears to be roughly 1-2 weeks for  mild symptoms and 3-6 weeks for severe disease.   GO IMMEDIATELY TO ER FOR FEVER YOU ARE UNABLE TO GET DOWN WITH TYLENOL, BREATHING PROBLEMS, CHEST PAIN, FATIGUE, LETHARGY, INABILITY TO EAT OR DRINK, ETC  QUARANTINE AND ISOLATION: To help decrease the spread of COVID-19 please remain isolated if you have COVID infection or are highly suspected to have COVID infection. This means -stay home and isolate to one room in the home if you live with others. Do not share a bed or bathroom with others while ill, sanitize and wipe down all countertops and keep common areas clean and disinfected. You may discontinue isolation if you have a mild case and are asymptomatic 10 days after symptom onset as long as you have been fever free >24 hours without having to take Motrin or Tylenol. If your case is more severe (meaning you develop pneumonia or are admitted in the hospital), you may have to isolate longer.   If you have been in close contact (within 6 feet) of someone diagnosed with COVID 19, you are advised to quarantine in your home for 14 days as symptoms can develop anywhere from 2-14 days after exposure to the virus. If you develop symptoms, you  must isolate.  Most current guidelines for COVID after exposure -isolate 10 days if you ARE NOT tested for COVID as long as symptoms do not develop -isolate 7 days if you are tested and remain asymptomatic -You do not necessarily need to be tested for COVID if you have + exposure and        develop   symptoms. Just isolate at home x10 days from symptom onset During  this global pandemic, CDC advises to practice social distancing, try to stay at least 56ft away from others at all times. Wear a face covering. Wash and sanitize your hands regularly and avoid going anywhere that is not necessary.  KEEP IN MIND THAT THE COVID TEST IS NOT 100% ACCURATE AND YOU SHOULD STILL DO EVERYTHING TO PREVENT POTENTIAL SPREAD OF VIRUS TO OTHERS (WEAR MASK, WEAR GLOVES, [place] HANDS AND  SANITIZE REGULARLY). IF INITIAL TEST IS NEGATIVE, THIS MAY NOT MEAN YOU ARE DEFINITELY NEGATIVE. MOST ACCURATE TESTING IS DONE 5-7 DAYS AFTER EXPOSURE.   It is not advised by CDC to get re-tested after receiving a positive COVID test since you can still test positive for weeks to months after you have already cleared the virus.   *If you have not been vaccinated for COVID, I strongly suggest you consider getting vaccinated as long as there are no contraindications.

## 2020-09-20 DIAGNOSIS — R200 Anesthesia of skin: Secondary | ICD-10-CM | POA: Diagnosis not present

## 2020-09-20 DIAGNOSIS — R202 Paresthesia of skin: Secondary | ICD-10-CM | POA: Diagnosis not present

## 2020-09-23 ENCOUNTER — Encounter: Payer: Self-pay | Admitting: Nurse Practitioner

## 2020-09-23 ENCOUNTER — Telehealth (INDEPENDENT_AMBULATORY_CARE_PROVIDER_SITE_OTHER): Payer: Medicare Other | Admitting: Nurse Practitioner

## 2020-09-23 ENCOUNTER — Other Ambulatory Visit: Payer: Self-pay

## 2020-09-23 DIAGNOSIS — J069 Acute upper respiratory infection, unspecified: Secondary | ICD-10-CM

## 2020-09-23 DIAGNOSIS — Z20822 Contact with and (suspected) exposure to covid-19: Secondary | ICD-10-CM

## 2020-09-23 NOTE — Assessment & Plan Note (Signed)
Acute, ongoing.  Continue with therapy as prescribed by urgent care.  Discussed length of cough for URI - up to 6 weeks.  Reswab for COVID.  Encouraged MAB if positive.  Otherwise, continue supportive care. Increase fluids. Encouraged acetaminophen / ibuprofen as needed for fever/pain. Encouraged salt water gargling, chloraseptic spray and throat lozenges. Encouraged saline sinus flushes. Encouraged humidified air.  With any sudden onset of chest pain or shortness of breath, go to ER.  Follow up if not feeling better by next week.

## 2020-09-23 NOTE — Addendum Note (Signed)
Addended by: Jerene Pitch on: 09/23/2020 04:35 PM   Modules accepted: Orders

## 2020-09-23 NOTE — Patient Instructions (Signed)
10 Things You Can Do to Manage Your COVID-19 Symptoms at Home If you have possible or confirmed COVID-19: 1. Stay home from work and school. And stay away from other public places. If you must go out, avoid using any kind of public transportation, ridesharing, or taxis. 2. Monitor your symptoms carefully. If your symptoms get worse, call your healthcare provider immediately. 3. Get rest and stay hydrated. 4. If you have a medical appointment, call the healthcare provider ahead of time and tell them that you have or may have COVID-19. 5. For medical emergencies, call 911 and notify the dispatch personnel that you have or may have COVID-19. 6. Cover your cough and sneezes with a tissue or use the inside of your elbow. 7. Wash your hands often with soap and water for at least 20 seconds or clean your hands with an alcohol-based hand sanitizer that contains at least 60% alcohol. 8. As much as possible, stay in a specific room and away from other people in your home. Also, you should use a separate bathroom, if available. If you need to be around other people in or outside of the home, wear a mask. 9. Avoid sharing personal items with other people in your household, like dishes, towels, and bedding. 10. Clean all surfaces that are touched often, like counters, tabletops, and doorknobs. Use household cleaning sprays or wipes according to the label instructions. cdc.gov/coronavirus 04/23/2019 This information is not intended to replace advice given to you by your health care provider. Make sure you discuss any questions you have with your health care provider. Document Revised: 09/25/2019 Document Reviewed: 09/25/2019 Elsevier Patient Education  2020 [employer]  

## 2020-09-23 NOTE — Progress Notes (Signed)
There were no vitals taken for this visit.   Subjective:    Patient ID: Catherine Giles, female    DOB: [birth year], 49 y.o.   MRN: [id number]  HPI: Catherine Giles is a 49 y.o. female presenting with upper respiratory infection symptoms.  Chief Complaint  Patient presents with  . Cough    Since Saturday, was out of town, had a negative Covid test on Monday, loss of smell and a lot of congestion   UPPER RESPIRATORY TRACT INFECTION Was seen in urgent care on 11/28 and COVID/flu was negative.  Was started on doxycycline, albuterol nebulizer, and prednisone.  Still dealing with significant cough. Onset: 09/18/20 Worst symptom: congestion and cough Fever: no Cough: yes; congested and productive - thick/yellow mucus Shortness of breath: yes; has been using albuterol with some relief Wheezing: no Chest pain: no Chest tightness: no Chest congestion: yes Nasal congestion: yes Runny nose: no Post nasal drip: no Sneezing: no Sore throat: no Swollen glands: no Sinus pressure: no Headache: no Face pain: no Toothache: no Ear pain: no  Ear pressure: no  Eyes red/itching:no Eye drainage/crusting: no  Nausea: no  Vomiting: no Diarrhea: no Change in appetite: yes; decreased Loss of taste/smell: has lost smell; not taste Rash: no Fatigue: yes Sick contacts: no; went to family gathering in [place]; nobody from gathering has tested + for COVID Strep contacts: no  Context: stable Recurrent sinusitis: no Treatments attempted: cough syrup with mucinex, antibiotics, prednisone Relief with OTC medications: no  Allergies  Allergen Reactions  . Azithromycin Anaphylaxis  . Penicillins     Did it involve swelling of the face/tongue/throat, SOB, or low BP? Yes Did it involve sudden or severe rash/hives, skin peeling, or any reaction on the inside of your mouth or nose? No Did you need to seek medical attention at a hospital or doctor's office? No When did it last happen? If all  above answers are "NO", may proceed with cephalosporin use.   . Shellfish Allergy    Outpatient Encounter Medications as of 09/23/2020  Medication Sig  . albuterol (VENTOLIN HFA) 108 (90 Base) MCG/ACT inhaler Inhale 2 puffs into the lungs every 4 (four) hours as needed.   . chlorhexidine (PERIDEX) 0.12 % solution 15 mLs 2 (two) times daily.  Marland Kitchen doxycycline (VIBRAMYCIN) 100 MG capsule Take 1 capsule (100 mg total) by mouth 2 (two) times daily for 7 days.  . fluticasone (FLONASE) 50 MCG/ACT nasal spray Place 2 sprays into both nostrils daily.  Marland Kitchen gabapentin (NEURONTIN) 100 MG capsule Take by mouth.  Marland Kitchen guaiFENesin-codeine (CHERATUSSIN AC) 100-10 MG/5ML syrup Take 10 mLs by mouth 4 (four) times daily as needed for up to 7 days for cough.  Marland Kitchen ipratropium-albuterol (DUONEB) 0.5-2.5 (3) MG/3ML SOLN Take 3 mLs by nebulization every 6 (six) hours as needed for up to 7 days.  Marland Kitchen levothyroxine (SYNTHROID) 50 MCG tablet Take 50 mcg by mouth daily before breakfast.   . liothyronine (CYTOMEL) 5 MCG tablet Take 5 mcg by mouth daily.  Marland Kitchen losartan (COZAAR) 100 MG tablet Take 100 mg by mouth daily.   . mupirocin cream (BACTROBAN) 2 % Apply 1 application topically 2 (two) times daily. Apply on open sores to buttocks as needed.  . predniSONE (DELTASONE) 20 MG tablet Take 2 tablets (40 mg total) by mouth daily for 5 days.  Marland Kitchen tiZANidine (ZANAFLEX) 4 MG tablet Take 1 tablet (4 mg total) by mouth every 8 (eight) hours as needed for muscle spasms.  . traMADol Veatrice Bourbon)  50 MG tablet Take 50 mg by mouth every 6 (six) hours as needed.   . triamcinolone cream (KENALOG) 0.1 % Apply 1 application topically daily as needed. Apply to affected areas twice daily as needed. Avoid Face,Groin and underarms  . umeclidinium-vilanterol (ANORO ELLIPTA) 62.5-25 MCG/INH AEPB Inhale 1 puff into the lungs daily in the afternoon.   . [DISCONTINUED] pregabalin (LYRICA) 50 MG capsule Take 50 mg by mouth 3 (three) times daily.  . [DISCONTINUED]  traZODone (DESYREL) 50 MG tablet Take 1 tablet (50 mg total) by mouth at bedtime.   No facility-administered encounter medications on file as of 09/23/2020.   Patient Active Problem List   Diagnosis Date Noted  . Upper respiratory tract infection 09/23/2020  . Cutaneous abscess of buttock 08/24/2020  . Breast pain, right 08/24/2020  . Breast pain, left 08/24/2020  . Right arm pain 08/24/2020  . Acute respiratory failure with hypoxia ([place])   . CAP (community acquired pneumonia) 04/29/2020  . COPD with acute bronchitis ([place]) 04/29/2020  . Vaginal discharge 03/19/2020  . Vaginal stenosis 03/19/2020  . Obesity 03/19/2020  . Insomnia 01/05/2020  . Complex regional pain syndrome I of upper limb 12/17/2019  . Pain of left hand 12/16/2019  . Essential hypertension 12/13/2019  . Centrilobular emphysema ([place]) 12/13/2019  . Elevated hemoglobin A1c 12/13/2019  . Hypothyroid 12/13/2019  . DDD (degenerative disc disease), lumbar 08/26/2019  . Multinodular goiter 08/14/2019  . Chronic left hip pain 05/05/2019  . Osteoarthritis of spine with radiculopathy, cervical region 05/05/2019  . Encounter for long-term (current) use of high-risk medication 02/14/2019  . Fibromyalgia 02/10/2019  . Rheumatoid arthritis involving multiple sites with positive rheumatoid factor ([place]) 02/10/2019  . Undifferentiated connective tissue disease ([place]) 02/10/2019  . Cervical radiculopathy 03/13/2018  . Vitamin D deficiency 10/11/2015   Past Medical History:  Diagnosis Date  . Arthritis    RA  . COPD (chronic obstructive pulmonary disease) ([place])   . Emphysema of lung ([place])   . Family history of breast cancer    6/21 cancer genetic testing letter sent  . Hypertension   . Lupus ([place])   . Pre-diabetes   . Thyroid disease    Relevant past medical, surgical, family and social history reviewed and updated as indicated. Interim medical history since our last visit reviewed.  Review of Systems  Constitutional:  Positive for activity change, appetite change and fatigue. Negative for chills, diaphoresis and fever.  HENT: Positive for congestion. Negative for dental problem, ear discharge, ear pain, facial swelling, postnasal drip, rhinorrhea, sinus pressure, sinus pain, sneezing, sore throat and trouble swallowing.   Eyes: Negative.  Negative for pain, discharge, redness, itching and visual disturbance.  Respiratory: Positive for cough and shortness of breath. Negative for chest tightness and wheezing.   Cardiovascular: Negative for chest pain and palpitations.  Gastrointestinal: Negative.  Negative for abdominal pain, nausea and vomiting.  Skin: Negative.  Negative for rash.  Neurological: Negative.   Psychiatric/Behavioral: Negative.     Per HPI unless specifically indicated above     Objective:    There were no vitals taken for this visit.  Wt Readings from Last 3 Encounters:  09/19/20 167 lb (75.8 kg)  08/23/20 172 lb 9.6 oz (78.3 kg)  08/11/20 170 lb (77.1 kg)    Physical Exam Vitals and nursing note reviewed.  Constitutional:      General: She is not in acute distress.    Appearance: Normal appearance. She is not ill-appearing or toxic-appearing.  HENT:  Head: Normocephalic and atraumatic.     Right Ear: External ear normal.     Left Ear: External ear normal.     Nose: Congestion present. No rhinorrhea.     Mouth/Throat:     Mouth: Mucous membranes are moist.     Pharynx: Oropharynx is clear.  Eyes:     General: No scleral icterus.       Right eye: No discharge.        Left eye: No discharge.     Extraocular Movements: Extraocular movements intact.  Cardiovascular:     Comments: Unable to assess heart sounds via virtual visit Pulmonary:     Effort: Pulmonary effort is normal. No respiratory distress.     Comments: Unable to assess breath sounds via virtual visit; patient talking in complete sentences; no accessory muscle use Abdominal:     Comments: Unable to assess  bowel sounds via virtual visit  Skin:    Coloration: Skin is not jaundiced or pale.     Findings: No erythema.  Neurological:     Mental Status: She is alert and oriented to person, place, and time.  Psychiatric:        Mood and Affect: Mood normal.        Behavior: Behavior normal.        Thought Content: Thought content normal.        Judgment: Judgment normal.     Results for orders placed or performed during the hospital encounter of 09/19/20  Resp Panel by RT-PCR (Flu A&B, Covid) Nasopharyngeal Swab   Specimen: Nasopharyngeal Swab; Nasopharyngeal(NP) swabs in vial transport medium  Result Value Ref Range   SARS Coronavirus 2 by RT PCR NEGATIVE NEGATIVE   Influenza A by PCR NEGATIVE NEGATIVE   Influenza B by PCR NEGATIVE NEGATIVE      Assessment & Plan:   Problem List Items Addressed This Visit      Respiratory   Upper respiratory tract infection - Primary    Acute, ongoing.  Continue with therapy as prescribed by urgent care.  Discussed length of cough for URI - up to 6 weeks.  Reswab for COVID.  Encouraged MAB if positive.  Otherwise, continue supportive care. Increase fluids. Encouraged acetaminophen / ibuprofen as needed for fever/pain. Encouraged salt water gargling, chloraseptic spray and throat lozenges. Encouraged saline sinus flushes. Encouraged humidified air.  With any sudden onset of chest pain or shortness of breath, go to ER.  Follow up if not feeling better by next week.        Other Visit Diagnoses    Suspected COVID-19 virus infection       Relevant Orders   Novel Coronavirus, NAA ([facility])       Follow up plan: Return if symptoms worsen or fail to improve.  Due to the catastrophic nature of the COVID-19 pandemic, this visit was completed via audio and visual contact via Caregility due to the restrictions of the COVID-19 pandemic. All issues as above were discussed and addressed. Physical exam was done as above through visual confirmation on  Caregility. If it was felt that the patient should be evaluated in the office, they were directed there. The patient verbally consented to this visit."} . Location of the patient: parking lot . Location of the provider: work . Those involved with this call:  . Provider: Carnella Gu[place], DNP . CMA: Tiffany Reel, CMA . Front Desk/Registration: Jill Side  . Time spent on call: 15 minutes with patient face to face via  video conference. More than 50% of this time was spent in counseling and coordination of care. 30 minutes total spent in review of patient's record and preparation of their chart.  I verified patient identity using two factors (patient name and date of birth). Patient consents verbally to being seen via telemedicine visit today.

## 2020-09-24 ENCOUNTER — Telehealth: Payer: Self-pay | Admitting: Family Medicine

## 2020-09-24 NOTE — Telephone Encounter (Signed)
Mandy calling from [facility] of [place] is calling to check on the status of paperwork for the patient for personal care services for [facility]. Please advise CB- [phone]

## 2020-09-24 NOTE — Telephone Encounter (Signed)
Called and left a message for Catherine Giles letting her know that the patient will need an appt.

## 2020-09-24 NOTE — Telephone Encounter (Signed)
I did not see anything in referrals or in [place] note about this, do you see anything?

## 2020-09-24 NOTE — Telephone Encounter (Signed)
No. Not documented last visit. Will need appt following illness

## 2020-09-25 LAB — SARS-COV-2, NAA 2 DAY TAT

## 2020-09-25 LAB — NOVEL CORONAVIRUS, NAA: SARS-CoV-2, NAA: NOT DETECTED

## 2020-09-26 NOTE — Progress Notes (Signed)
Please let Catherine Giles know her Covid testing returned negative.

## 2020-09-30 ENCOUNTER — Other Ambulatory Visit: Payer: Self-pay

## 2020-09-30 NOTE — Telephone Encounter (Signed)
Lvm to make apt.  

## 2020-09-30 NOTE — Telephone Encounter (Signed)
Due for physical.

## 2020-09-30 NOTE — Telephone Encounter (Signed)
Last seen for routine f/up 08/23/20 by Noemi Chapel.

## 2020-10-04 NOTE — Telephone Encounter (Signed)
Mailbox full unable to lvm to make apt.

## 2020-10-07 DIAGNOSIS — J439 Emphysema, unspecified: Secondary | ICD-10-CM | POA: Diagnosis not present

## 2020-10-12 ENCOUNTER — Encounter: Payer: Self-pay | Admitting: Nurse Practitioner

## 2020-10-12 ENCOUNTER — Other Ambulatory Visit: Payer: Self-pay

## 2020-10-12 ENCOUNTER — Ambulatory Visit (INDEPENDENT_AMBULATORY_CARE_PROVIDER_SITE_OTHER): Payer: Medicare Other | Admitting: Nurse Practitioner

## 2020-10-12 VITALS — BP 140/82 | HR 94 | Temp 98.4°F | Ht 62.52 in | Wt 174.0 lb

## 2020-10-12 DIAGNOSIS — R7309 Other abnormal glucose: Secondary | ICD-10-CM

## 2020-10-12 DIAGNOSIS — R413 Other amnesia: Secondary | ICD-10-CM | POA: Diagnosis not present

## 2020-10-12 DIAGNOSIS — G44209 Tension-type headache, unspecified, not intractable: Secondary | ICD-10-CM

## 2020-10-12 DIAGNOSIS — Z6831 Body mass index (BMI) 31.0-31.9, adult: Secondary | ICD-10-CM

## 2020-10-12 DIAGNOSIS — Z91011 Allergy to milk products: Secondary | ICD-10-CM | POA: Diagnosis not present

## 2020-10-12 DIAGNOSIS — I10 Essential (primary) hypertension: Secondary | ICD-10-CM | POA: Diagnosis not present

## 2020-10-12 DIAGNOSIS — E559 Vitamin D deficiency, unspecified: Secondary | ICD-10-CM | POA: Diagnosis not present

## 2020-10-12 DIAGNOSIS — R519 Headache, unspecified: Secondary | ICD-10-CM | POA: Insufficient documentation

## 2020-10-12 DIAGNOSIS — E6609 Other obesity due to excess calories: Secondary | ICD-10-CM

## 2020-10-12 DIAGNOSIS — R739 Hyperglycemia, unspecified: Secondary | ICD-10-CM | POA: Diagnosis not present

## 2020-10-12 MED ORDER — AMLODIPINE BESYLATE 5 MG PO TABS: 5.0000 mg | ORAL_TABLET | Freq: Every day | ORAL | 3 refills | Status: DC

## 2020-10-12 NOTE — Assessment & Plan Note (Signed)
BMI 31.30.  Recommended eating smaller high protein, low fat meals more frequently and exercising 30 mins a day 5 times a week with a goal of 10-15lb weight loss in the next 3 months. Patient voiced their understanding and motivation to adhere to these recommendations.

## 2020-10-12 NOTE — Progress Notes (Signed)
BP 140/82 (BP Location: Left Arm)    Pulse 94    Temp 98.4 F (36.9 C) (Oral)    Ht 5' 2.52" (1.588 m)    Wt 174 lb (78.9 kg)    SpO2 93%    BMI 31.30 kg/m    Subjective:    Patient ID: Catherine Giles, female    DOB: [birth year], 49 y.o.   MRN: [id number]  HPI: Catherine Giles is a 49 y.o. female  Chief Complaint  Patient presents with   Allergy Testing    Patient states that Dairy and seafood makes her ill.   Memory Loss    Patient states that she is having memory issues   Wants sugar test    States that she has been very thirsty and urinating frequently   Headache    Currently having a bad headache   HEADACHES WITH MEMORY LOSS Has been present for two days, reports increased stress and not feeling well overall with the Covid Pandemic being present.  Her blood pressure has been up 150/90 range at home and she thinks this is cause of HA.  Reports increased stressors with Covid and isolation.  States she has been having memory changes for a couple months, reports today she did not even know what she came for, missing appointments and missing medications.  Currently is followed by neurology -- has not told them about this she reports.  Last saw 09/20/2020.  She would like her A1c checked today -- urinating more often and is thirsty.   Duration: days Onset: gradual Severity: moderate Quality: aching Frequency: intermittent Location: frontal Headache duration: a couple hours to 30 minutes Radiation: no Time of day headache occurs: varies Alleviating factors: Motrin and Tramadol Aggravating factors: stress Headache status at time of visit: current headache Treatments attempted: Treatments attempted: Motrin and Tramadol Aura: no Nausea:  yes Vomiting: no Photophobia:  yes Phonophobia:  none Effect on social functioning:  no Numbers of missed days of school/work each month: none Confusion:  no Gait disturbance/ataxia:  no Behavioral changes:  no Fevers:  no  6CIT Screen  10/12/2020  What Year? 0 points  What month? 0 points  What time? 0 points  Count back from 20 0 points  Months in reverse 0 points  Repeat phrase 8 points  Total Score 8   Depression screen [facility] 2/9 10/12/2020 05/20/2019  Decreased Interest 0 0  Down, Depressed, Hopeless 0 1  PHQ - 2 Score 0 1  Altered sleeping 3 2  Tired, decreased energy 3 3  Change in appetite 2 2  Feeling bad or failure about yourself  0 0  Trouble concentrating 2 1  Moving slowly or fidgety/restless 2 1  Suicidal thoughts 0 0  PHQ-9 Score 12 10  Difficult doing work/chores Somewhat difficult Very difficult    ALLERGIES She would like referral to allergist, reports dairy and seafood been making her sick -- this has been ongoing for a couple months.  Would like allergy testing, has not had since childhood.  Makes her feel like has food poisoning.  Currently no allergy treatment.  No seasonal allergies Duration: chronic Satisfied with current treatment: no Allergist evaluation in past: when she was a child Allergen injection immunotherapy: no Recurrent sinus infections: no ENT evaluation in past: no Known environmental allergy: no Indoor pets: no History of asthma: emphysema Current allergy medications: none Treatments attempted: none  HYPERTENSION Taking Losartan 100 MG daily.  Hypertension status: uncontrolled Satisfied with current treatment? yes Duration  of hypertension: chronic BP monitoring frequency:  a few times a week BP range:  140-150/90 BP medication side effects:  no Medication compliance: good compliance Previous BP meds: Losartan Aspirin: no Recurrent headaches: yes Visual changes: no Palpitations: no Dyspnea: no Chest pain: no Lower extremity edema: no Dizzy/lightheaded: no  Relevant past medical, surgical, family and social history reviewed and updated as indicated. Interim medical history since our last visit reviewed. Allergies and medications reviewed and  updated.  Review of Systems  Constitutional: Negative for activity change, appetite change, diaphoresis, fatigue and fever.  Respiratory: Negative for cough, chest tightness and shortness of breath.   Cardiovascular: Negative for chest pain, palpitations and leg swelling.  Gastrointestinal: Negative.   Endocrine: Positive for polydipsia and polyuria. Negative for cold intolerance, heat intolerance and polyphagia.  Neurological: Positive for headaches. Negative for dizziness, syncope, weakness, light-headedness and numbness.  Psychiatric/Behavioral: Negative.     Per HPI unless specifically indicated above     Objective:    BP 140/82 (BP Location: Left Arm)    Pulse 94    Temp 98.4 F (36.9 C) (Oral)    Ht 5' 2.52" (1.588 m)    Wt 174 lb (78.9 kg)    SpO2 93%    BMI 31.30 kg/m   Wt Readings from Last 3 Encounters:  10/12/20 174 lb (78.9 kg)  09/19/20 167 lb (75.8 kg)  08/23/20 172 lb 9.6 oz (78.3 kg)    Physical Exam Vitals and nursing note reviewed.  Constitutional:      General: She is awake. She is not in acute distress.    Appearance: She is well-developed and well-groomed. She is obese. She is not ill-appearing.  HENT:     Head: Normocephalic.     Right Ear: Hearing, ear canal and external ear normal. No drainage.     Left Ear: Hearing, ear canal and external ear normal. No drainage.  Eyes:     General: Lids are normal. No scleral icterus.       Right eye: No discharge.        Left eye: No discharge.     Extraocular Movements: Extraocular movements intact.     Conjunctiva/sclera: Conjunctivae normal.     Pupils: Pupils are equal, round, and reactive to light.     Visual Fields: Right eye visual fields normal and left eye visual fields normal.  Neck:     Thyroid: No thyromegaly.     Vascular: No carotid bruit.  Cardiovascular:     Rate and Rhythm: Normal rate and regular rhythm.     Heart sounds: Normal heart sounds. No murmur heard. No gallop.   Pulmonary:      Effort: Pulmonary effort is normal. No accessory muscle usage or respiratory distress.     Breath sounds: Normal breath sounds.  Abdominal:     General: Bowel sounds are normal.     Palpations: Abdomen is soft. There is no hepatomegaly or splenomegaly.  Musculoskeletal:     Cervical back: Normal range of motion and neck supple.     Right lower leg: No edema.     Left lower leg: No edema.  Skin:    General: Skin is warm and dry.  Neurological:     Mental Status: She is alert and oriented to person, place, and time.     Cranial Nerves: Cranial nerves are intact.     Motor: Motor function is intact.     Coordination: Coordination is intact.     Gait:  Gait is intact.     Deep Tendon Reflexes: Reflexes are normal and symmetric.     Reflex Scores:      Brachioradialis reflexes are 2+ on the right side and 2+ on the left side.      Patellar reflexes are 2+ on the right side and 2+ on the left side. Psychiatric:        Attention and Perception: Attention normal.        Mood and Affect: Mood normal.        Speech: Speech normal.        Behavior: Behavior normal. Behavior is cooperative.        Thought Content: Thought content normal.     Results for orders placed or performed in visit on 09/23/20  Novel Coronavirus, NAA ([facility])   Specimen: Nasopharyngeal(NP) swabs in vial transport medium  Result Value Ref Range   SARS-CoV-2, NAA Not Detected Not Detected  SARS-COV-2, NAA 2 DAY TAT  Result Value Ref Range   SARS-CoV-2, NAA 2 DAY TAT Performed       Assessment & Plan:   Problem List Items Addressed This Visit      Cardiovascular and Mediastinum   Essential hypertension - Primary    Chronic, ongoing.  Elevation today noted and she reports intermittent headaches + elevations at home on occasion.  No red flag findings on exam.  Continue Losartan 100 MG daily and add on Amlodipine 5 MG daily, script sent.  This may offer benefit to SBP, educated her on medication and side effects.   Will obtain outpatient labs today.  Recommend she monitor BP at least a few mornings a week at home and document.  DASH diet at home.  Return in 6 weeks.      Relevant Medications   amLODipine (NORVASC) 5 MG tablet     Other   Vitamin D deficiency    Chronic, check Vit D level today and continue supplement.      Relevant Orders   VITAMIN D 25 Hydroxy (Vit-D Deficiency, Fractures)   Elevated hemoglobin A1c    Noted in October 2020 with current polyphagia and polydipsia, recheck today and initiate medication as needed.      Obesity    BMI 31.30.  Recommended eating smaller high protein, low fat meals more frequently and exercising 30 mins a day 5 times a week with a goal of 10-15lb weight loss in the next 3 months. Patient voiced their understanding and motivation to adhere to these recommendations.       Memory changes    Noted worsening over past 2 months, suspect multifactorial with increased stressors leading to worsening symptoms.  6CIT 8 and PHQ9 12 today.  Have recommend she follow-up with neurology as soon as possible due to recent changes.  Will obtain labs today to further assess, to include CBC, TSH, RPR, HIV, and B12 level.  Does not wish to start mood medication, although may benefit from this in long run.      Relevant Orders   CBC with Differential/Platelet   Vitamin B12   TSH   Comprehensive metabolic panel   HgB J0Z   RPR   HIV antibody (with reflex)   Dairy allergy    Patient requesting referral to allergist for full allergy work-up, referral placed.      Relevant Orders   Ambulatory referral to Allergy   Headache    Suspect related to increased stressors and BP elevations. Refer to HTN plan of care for  BP medication changes.  No red flag symptoms or signs on exam.  Recommend she continue Tramadol and Flexeril as needed at home.  Avoid Ibuprofen as will cause elevation in BP if used frequently.  Recommend use of Tylenol as needed + plenty of rest and fluid.   Return for worsening or ongoing pain.      Relevant Medications   ibuprofen (ADVIL) 400 MG tablet   amLODipine (NORVASC) 5 MG tablet       Follow up plan: Return in about 6 weeks (around 11/23/2020) for BP recheck.

## 2020-10-12 NOTE — Assessment & Plan Note (Signed)
Suspect related to increased stressors and BP elevations. Refer to HTN plan of care for BP medication changes.  No red flag symptoms or signs on exam.  Recommend she continue Tramadol and Flexeril as needed at home.  Avoid Ibuprofen as will cause elevation in BP if used frequently.  Recommend use of Tylenol as needed + plenty of rest and fluid.  Return for worsening or ongoing pain.

## 2020-10-12 NOTE — Assessment & Plan Note (Signed)
Chronic, check Vit D level today and continue supplement.

## 2020-10-12 NOTE — Assessment & Plan Note (Signed)
Patient requesting referral to allergist for full allergy work-up, referral placed.

## 2020-10-12 NOTE — Assessment & Plan Note (Signed)
Noted worsening over past 2 months, suspect multifactorial with increased stressors leading to worsening symptoms.  6CIT 8 and PHQ9 12 today.  Have recommend she follow-up with neurology as soon as possible due to recent changes.  Will obtain labs today to further assess, to include CBC, TSH, RPR, HIV, and B12 level.  Does not wish to start mood medication, although may benefit from this in long run.

## 2020-10-12 NOTE — Patient Instructions (Addendum)
Recommend schedule follow-up with neurology in regard to memory   [place] stands for "Dietary Approaches to Stop Hypertension." The DASH eating plan is a healthy eating plan that has been shown to reduce high blood pressure (hypertension). It may also reduce your risk for type 2 diabetes, heart disease, and stroke. The DASH eating plan may also help with weight loss. What are tips for following this plan?  General guidelines  Avoid eating more than 2,300 mg (milligrams) of salt (sodium) a day. If you have hypertension, you may need to reduce your sodium intake to 1,500 mg a day.  Limit alcohol intake to no more than 1 drink a day for nonpregnant women and 2 drinks a day for men. One drink equals 12 oz of beer, 5 oz of wine, or 1 oz of hard liquor.  Work with your health care provider to maintain a healthy body weight or to lose weight. Ask what an ideal weight is for you.  Get at least 30 minutes of exercise that causes your heart to beat faster (aerobic exercise) most days of the week. Activities may include walking, swimming, or biking.  Work with your health care provider or diet and nutrition specialist (dietitian) to adjust your eating plan to your individual calorie needs. Reading food labels   Check food labels for the amount of sodium per serving. Choose foods with less than 5 percent of the Daily Value of sodium. Generally, foods with less than 300 mg of sodium per serving fit into this eating plan.  To find whole grains, look for the word "whole" as the first word in the ingredient list. Shopping  Buy products labeled as "low-sodium" or "no salt added."  Buy fresh foods. Avoid canned foods and premade or frozen meals. Cooking  Avoid adding salt when cooking. Use salt-free seasonings or herbs instead of table salt or sea salt. Check with your health care provider or pharmacist before using salt substitutes.  Do not fry foods. Cook foods using healthy methods  such as baking, boiling, grilling, and broiling instead.  Cook with heart-healthy oils, such as olive, canola, soybean, or sunflower oil. Meal planning  Eat a balanced diet that includes: ? 5 or more servings of fruits and vegetables each day. At each meal, try to fill half of your plate with fruits and vegetables. ? Up to 6-8 servings of whole grains each day. ? Less than 6 oz of lean meat, poultry, or fish each day. A 3-oz serving of meat is about the same size as a deck of cards. One egg equals 1 oz. ? 2 servings of low-fat dairy each day. ? A serving of nuts, seeds, or beans 5 times each week. ? Heart-healthy fats. Healthy fats called Omega-3 fatty acids are found in foods such as flaxseeds and coldwater fish, like sardines, salmon, and mackerel.  Limit how much you eat of the following: ? Canned or prepackaged foods. ? Food that is high in trans fat, such as fried foods. ? Food that is high in saturated fat, such as fatty meat. ? Sweets, desserts, sugary drinks, and other foods with added sugar. ? Full-fat dairy products.  Do not salt foods before eating.  Try to eat at least 2 vegetarian meals each week.  Eat more home-cooked food and less restaurant, buffet, and fast food.  When eating at a restaurant, ask that your food be prepared with less salt or no salt, if possible. What foods are recommended? The items listed  may not be a complete list. Talk with your dietitian about what dietary choices are best for you. Grains Whole-grain or whole-wheat bread. Whole-grain or whole-wheat pasta. Brown rice. [place]. Bulgur. Whole-grain and low-sodium cereals. Pita bread. Low-fat, low-sodium crackers. Whole-wheat flour tortillas. Vegetables Fresh or frozen vegetables (raw, steamed, roasted, or grilled). Low-sodium or reduced-sodium tomato and vegetable juice. Low-sodium or reduced-sodium tomato sauce and tomato paste. Low-sodium or reduced-sodium canned vegetables. Fruits All  fresh, dried, or frozen fruit. Canned fruit in natural juice (without added sugar). Meat and other protein foods Skinless chicken or [place]. Ground chicken or [place]. Pork with fat trimmed off. Fish and seafood. Egg whites. Dried beans, peas, or lentils. Unsalted nuts, nut butters, and seeds. Unsalted canned beans. Lean cuts of beef with fat trimmed off. Low-sodium, lean deli meat. Dairy Low-fat (1%) or fat-free (skim) milk. Fat-free, low-fat, or reduced-fat cheeses. Nonfat, low-sodium ricotta or cottage cheese. Low-fat or nonfat yogurt. Low-fat, low-sodium cheese. Fats and oils Soft margarine without trans fats. Vegetable oil. Low-fat, reduced-fat, or light mayonnaise and salad dressings (reduced-sodium). Canola, safflower, olive, soybean, and sunflower oils. Avocado. Seasoning and other foods Herbs. Spices. Seasoning mixes without salt. Unsalted popcorn and pretzels. Fat-free sweets. What foods are not recommended? The items listed may not be a complete list. Talk with your dietitian about what dietary choices are best for you. Grains Baked goods made with fat, such as croissants, muffins, or some breads. Dry pasta or rice meal packs. Vegetables Creamed or fried vegetables. Vegetables in a cheese sauce. Regular canned vegetables (not low-sodium or reduced-sodium). Regular canned tomato sauce and paste (not low-sodium or reduced-sodium). Regular tomato and vegetable juice (not low-sodium or reduced-sodium). Angie Fava. Olives. Fruits Canned fruit in a light or heavy syrup. Fried fruit. Fruit in cream or butter sauce. Meat and other protein foods Fatty cuts of meat. Ribs. Fried meat. Berniece Salines. Sausage. Bologna and other processed lunch meats. Salami. Fatback. Hotdogs. Bratwurst. Salted nuts and seeds. Canned beans with added salt. Canned or smoked fish. Whole eggs or egg yolks. Chicken or [place] with skin. Dairy Whole or 2% milk, cream, and half-and-half. Whole or full-fat cream cheese. Whole-fat or  sweetened yogurt. Full-fat cheese. Nondairy creamers. Whipped toppings. Processed cheese and cheese spreads. Fats and oils Butter. Stick margarine. Lard. Shortening. Ghee. Bacon fat. Tropical oils, such as coconut, palm kernel, or palm oil. Seasoning and other foods Salted popcorn and pretzels. Onion salt, garlic salt, seasoned salt, table salt, and sea salt. Worcestershire sauce. Tartar sauce. Barbecue sauce. Teriyaki sauce. Soy sauce, including reduced-sodium. Steak sauce. Canned and packaged gravies. Fish sauce. Oyster sauce. Cocktail sauce. Horseradish that you find on the shelf. Ketchup. Mustard. Meat flavorings and tenderizers. Bouillon cubes. Hot sauce and Tabasco sauce. Premade or packaged marinades. Premade or packaged taco seasonings. Relishes. Regular salad dressings. Where to find more information:  National Heart, Lung, and [place]: https://wilson-eaton.com/  American Heart Association: www.heart.org Summary  The DASH eating plan is a healthy eating plan that has been shown to reduce high blood pressure (hypertension). It may also reduce your risk for type 2 diabetes, heart disease, and stroke.  With the DASH eating plan, you should limit salt (sodium) intake to 2,300 mg a day. If you have hypertension, you may need to reduce your sodium intake to 1,500 mg a day.  When on the DASH eating plan, aim to eat more fresh fruits and vegetables, whole grains, lean proteins, low-fat dairy, and heart-healthy fats.  Work with your health care provider or diet  and nutrition specialist (dietitian) to adjust your eating plan to your individual calorie needs. This information is not intended to replace advice given to you by your health care provider. Make sure you discuss any questions you have with your health care provider. Document Revised: 09/21/2017 Document Reviewed: 10/02/2016 Elsevier Patient Education  2020 [employer].

## 2020-10-12 NOTE — Assessment & Plan Note (Signed)
Noted in October 2020 with current polyphagia and polydipsia, recheck today and initiate medication as needed.

## 2020-10-12 NOTE — Assessment & Plan Note (Signed)
Chronic, ongoing.  Elevation today noted and she reports intermittent headaches + elevations at home on occasion.  No red flag findings on exam.  Continue Losartan 100 MG daily and add on Amlodipine 5 MG daily, script sent.  This may offer benefit to SBP, educated her on medication and side effects.  Will obtain outpatient labs today.  Recommend she monitor BP at least a few mornings a week at home and document.  DASH diet at home.  Return in 6 weeks.

## 2020-10-13 ENCOUNTER — Other Ambulatory Visit: Payer: Self-pay | Admitting: Nurse Practitioner

## 2020-10-13 MED ORDER — CHOLECALCIFEROL 1.25 MG (50000 UT) PO TABS: 1.0000 | ORAL_TABLET | ORAL | 0 refills | Status: DC

## 2020-10-13 NOTE — Progress Notes (Signed)
Contacted via MyChart   Good afternoon Ms. Taves, your labs have returned.  CBC shows no anemia.  B12 level is still pending and will let you know when this returns.  Thyroid level normal. Kidney function is normal, as is liver.  A1c remains in prediabetic range with some upward trend.  The A1C is the diabetes testing we talked about, this looks at your blood sugars over the past 3 months and turns the average into a number.  Your number is 6.3%, meaning you are prediabetic.  Any number 5.7 to 6.4 is considered prediabetes and any number 6.5 or greater is considered diabetes.   I would recommend heavy focus on decreasing foods high in sugar and your intake of things like bread products, pasta, and rice.  The American Diabetes Association online has a large amount of information on diet changes to make.  We will recheck this number in 3 months to ensure you are not continuing to trend upwards and move into diabetes.  Vitamin D level is quite low at 8.7.  Would like to see greater then 30.  I have sent in a higher dose Vitamin D3 supplement for you to start taking weekly and we will recheck at next visit.  Any questions? Keep being awesome!!  Thank you for allowing me to participate in your care. Kindest regards, Freddy Spadafora

## 2020-10-14 LAB — COMPREHENSIVE METABOLIC PANEL
ALT: 18 IU/L (ref 0–32)
AST: 14 IU/L (ref 0–40)
Albumin/Globulin Ratio: 1.4 (ref 1.2–2.2)
Albumin: 4.2 g/dL (ref 3.8–4.9)
Alkaline Phosphatase: 81 IU/L (ref 44–121)
BUN/Creatinine Ratio: 26 — ABNORMAL HIGH (ref 9–23)
BUN: 12 mg/dL (ref 6–24)
Bilirubin Total: 0.3 mg/dL (ref 0.0–1.2)
CO2: 21 mmol/L (ref 20–29)
Calcium: 9.2 mg/dL (ref 8.7–10.2)
Chloride: 103 mmol/L (ref 96–106)
Creatinine, Ser: 0.47 mg/dL — ABNORMAL LOW (ref 0.57–1.00)
GFR calc Af Amer: 132 mL/min/{1.73_m2} (ref >59)
GFR calc non Af Amer: 115 mL/min/{1.73_m2} (ref >59)
Globulin, Total: 3 g/dL (ref 1.5–4.5)
Glucose: 109 mg/dL — ABNORMAL HIGH (ref 65–99)
Potassium: 3.9 mmol/L (ref 3.5–5.2)
Sodium: 142 mmol/L (ref 134–144)
Total Protein: 7.2 g/dL (ref 6.0–8.5)

## 2020-10-14 LAB — HIV ANTIBODY (ROUTINE TESTING W REFLEX): HIV Screen 4th Generation wRfx: NONREACTIVE

## 2020-10-14 LAB — CBC WITH DIFFERENTIAL/PLATELET
Basophils Absolute: 0.1 10*3/uL (ref 0.0–0.2)
Basos: 1 %
EOS (ABSOLUTE): 0.1 10*3/uL (ref 0.0–0.4)
Eos: 1 %
Hematocrit: 46.1 % (ref 34.0–46.6)
Hemoglobin: 15.1 g/dL (ref 11.1–15.9)
Immature Grans (Abs): 0 10*3/uL (ref 0.0–0.1)
Immature Granulocytes: 0 %
Lymphocytes Absolute: 3.9 10*3/uL — ABNORMAL HIGH (ref 0.7–3.1)
Lymphs: 48 %
MCH: 30.4 pg (ref 26.6–33.0)
MCHC: 32.8 g/dL (ref 31.5–35.7)
MCV: 93 fL (ref 79–97)
Monocytes Absolute: 0.5 10*3/uL (ref 0.1–0.9)
Monocytes: 7 %
Neutrophils Absolute: 3.5 10*3/uL (ref 1.4–7.0)
Neutrophils: 43 %
Platelets: 388 10*3/uL (ref 150–450)
RBC: 4.96 x10E6/uL (ref 3.77–5.28)
RDW: 13.2 % (ref 11.7–15.4)
WBC: 8.2 10*3/uL (ref 3.4–10.8)

## 2020-10-14 LAB — VITAMIN D 25 HYDROXY (VIT D DEFICIENCY, FRACTURES): Vit D, 25-Hydroxy: 8.7 ng/mL — ABNORMAL LOW (ref 30.0–100.0)

## 2020-10-14 LAB — VITAMIN B12: Vitamin B-12: 1005 pg/mL (ref 232–1245)

## 2020-10-14 LAB — TSH: TSH: 3.22 u[IU]/mL (ref 0.450–4.500)

## 2020-10-14 LAB — RPR

## 2020-10-14 LAB — HEMOGLOBIN A1C
Est. average glucose Bld gHb Est-mCnc: 134 mg/dL
Hgb A1c MFr Bld: 6.3 % — ABNORMAL HIGH (ref 4.8–5.6)

## 2020-10-15 ENCOUNTER — Encounter: Payer: Self-pay | Admitting: Emergency Medicine

## 2020-10-15 ENCOUNTER — Other Ambulatory Visit: Payer: Self-pay

## 2020-10-15 ENCOUNTER — Ambulatory Visit: Payer: Self-pay

## 2020-10-15 ENCOUNTER — Ambulatory Visit
Admission: EM | Admit: 2020-10-15 | Discharge: 2020-10-15 | Disposition: A | Discharge status: Home / Self Care | Payer: Medicare Other | Attending: Sports Medicine | Admitting: Sports Medicine

## 2020-10-15 DIAGNOSIS — R058 Contact with and (suspected) exposure to covid-19: Secondary | ICD-10-CM

## 2020-10-15 DIAGNOSIS — J069 Acute upper respiratory infection, unspecified: Secondary | ICD-10-CM | POA: Diagnosis not present

## 2020-10-15 DIAGNOSIS — Z20822 Contact with and (suspected) exposure to covid-19: Secondary | ICD-10-CM | POA: Diagnosis not present

## 2020-10-15 LAB — RESP PANEL BY RT-PCR (FLU A&B, COVID) ARPGX2
Influenza A by PCR: NEGATIVE
Influenza B by PCR: NEGATIVE
SARS Coronavirus 2 by RT PCR: NEGATIVE

## 2020-10-15 NOTE — ED Triage Notes (Signed)
Patient in today c/o cough x 1 week. Patient had fever (102) yesterday. Patient had a covid exposure on (10/13/20). Patient has had the covid vaccines.

## 2020-10-15 NOTE — ED Provider Notes (Signed)
[facility]    CSN: [id number] Arrival date & time: 10/15/20  0802      History   Chief Complaint Chief Complaint  Patient presents with  . Cough  . Covid Exposure    HPI Catherine Giles is a 49 y.o. female.   Is a pleasant 49 year old female who presents for the above complaints.  Per history, patient does have COPD and emphysema.  I do not see any official pulmonologist in the chart.  She just relocated here from up [place] and said that she was worked up up there.  She comes in today with cough for 2 weeks.  Was seen here in late November and given cough medicine.  She reports Covid exposure from her nephew.  She reports for the past 3 days her cough is worsened with body aches fevers and chills.  No urinary abdominal symptoms.  She is fully vaccinated.  She denies any significant shortness of breath or chest pain.  She does have a sore throat but she thinks is from the cough.  Some mild postnasal drip.  No ear pain.  No red flag signs or symptoms offered on history.     Past Medical History:  Diagnosis Date  . Arthritis    RA  . COPD (chronic obstructive pulmonary disease) ([place])   . Emphysema of lung ([place])   . Family history of breast cancer    6/21 cancer genetic testing letter sent  . Hypertension   . Lupus ([place])   . Pre-diabetes   . Thyroid disease     Patient Active Problem List   Diagnosis Date Noted  . Memory changes 10/12/2020  . Dairy allergy 10/12/2020  . Headache 10/12/2020  . Vaginal stenosis 03/19/2020  . Obesity 03/19/2020  . Insomnia 01/05/2020  . Complex regional pain syndrome I of upper limb 12/17/2019  . Essential hypertension 12/13/2019  . Centrilobular emphysema ([place]) 12/13/2019  . Elevated hemoglobin A1c 12/13/2019  . Hypothyroid 12/13/2019  . DDD (degenerative disc disease), lumbar 08/26/2019  . Multinodular goiter 08/14/2019  . Chronic left hip pain 05/05/2019  . Osteoarthritis of spine with radiculopathy, cervical region  05/05/2019  . Encounter for long-term (current) use of high-risk medication 02/14/2019  . Fibromyalgia 02/10/2019  . Rheumatoid arthritis involving multiple sites with positive rheumatoid factor ([place]) 02/10/2019  . Undifferentiated connective tissue disease ([place]) 02/10/2019  . Cervical radiculopathy 03/13/2018  . Vitamin D deficiency 10/11/2015    Past Surgical History:  Procedure Laterality Date  . MOUTH SURGERY      OB History    Gravida  9   Para  6   Term      Preterm      AB  3   Living        SAB  2   IAB      Ectopic      Multiple      Live Births               Home Medications    Prior to Admission medications   Medication Sig Start Date End Date Taking? Authorizing Provider  albuterol (VENTOLIN HFA) 108 (90 Base) MCG/ACT inhaler Inhale 2 puffs into the lungs every 4 (four) hours as needed.  02/12/19 10/15/20 Yes [provider]  amLODipine (NORVASC) 5 MG tablet Take 1 tablet (5 mg total) by mouth daily. 10/12/20  Yes Cannady, Jolene T, NP  Cholecalciferol 1.25 MG (50000 UT) TABS Take 1 tablet by mouth once a week. For  8 weeks and then stop.  Return to office for lab draw. 10/13/20  Yes Cannady, Jolene T, NP  fluticasone (FLONASE) 50 MCG/ACT nasal spray Place 2 sprays into both nostrils daily. 07/01/20  Yes Rodriguez-Southworth, Nettie Elm, PA-C  gabapentin (NEURONTIN) 100 MG capsule Take by mouth. 09/01/20  Yes [provider]  ibuprofen (ADVIL) 400 MG tablet Take by mouth. 06/03/20  Yes [provider]  ipratropium-albuterol (DUONEB) 0.5-2.5 (3) MG/3ML SOLN Take 3 mLs by nebulization every 6 (six) hours as needed for up to 7 days. 06/12/20 06/19/20 Yes Shirlee Latch, PA-C  levothyroxine (SYNTHROID) 50 MCG tablet Take 50 mcg by mouth daily before breakfast.    Yes [provider]  liothyronine (CYTOMEL) 5 MCG tablet Take 5 mcg by mouth daily. 08/05/20  Yes [provider]  losartan (COZAAR) 100 MG tablet Take 100  mg by mouth daily.    Yes [provider]  mupirocin cream (BACTROBAN) 2 % Apply 1 application topically 2 (two) times daily. Apply on open sores to buttocks as needed. 08/23/20  Yes Cathlean Marseilles A, NP  predniSONE (DELTASONE) 10 MG tablet Take 10 mg by mouth daily. 10/05/20  Yes [provider]  tiZANidine (ZANAFLEX) 4 MG tablet Take 1 tablet (4 mg total) by mouth every 8 (eight) hours as needed for muscle spasms. 08/23/20  Yes Valentino Nose, NP  traMADol (ULTRAM) 50 MG tablet Take 50 mg by mouth every 6 (six) hours as needed.    Yes [provider]  triamcinolone cream (KENALOG) 0.1 % Apply 1 application topically daily as needed. Apply to affected areas twice daily as needed. Avoid Face,Groin and underarms 03/23/20  Yes Willeen Niece, MD  umeclidinium-vilanterol [facility] ELLIPTA) 62.5-25 MCG/INH AEPB Inhale 1 puff into the lungs daily in the afternoon.  02/12/19  Yes [provider]  chlorhexidine (PERIDEX) 0.12 % solution 15 mLs 2 (two) times daily. 06/03/20   [provider]  levofloxacin (LEVAQUIN) 500 MG tablet Take 500 mg by mouth daily. 06/01/20   [provider]  traZODone (DESYREL) 50 MG tablet Take 1 tablet (50 mg total) by mouth at bedtime. 01/05/20 07/01/20  Marjie Skiff, NP    Family History Family History  Problem Relation Age of Onset  . Lung cancer Mother   . Diabetes Mother   . Breast cancer Mother 5  . Hypertension Father   . Cancer Maternal Aunt   . Ovarian cancer Maternal Aunt   . Diabetes Maternal Aunt   . Diabetes Maternal Uncle   . Hypertension Maternal Grandmother   . Diabetes Maternal Grandmother     Social History Social History   Tobacco Use  . Smoking status: Current Every Day Smoker    Packs/day: 1.00    Years: 34.00    Pack years: 34.00    Types: Cigarettes  . Smokeless tobacco: Never Used  . Tobacco comment: 2-3 cig/day  Vaping Use  . Vaping Use: Never used  Substance Use Topics  .  Alcohol use: Yes    Comment: Occ  . Drug use: Not Currently     Allergies   Azithromycin, Penicillins, and Shellfish allergy   Review of Systems Review of Systems  Constitutional: Positive for chills, fatigue and fever.  HENT: Positive for congestion, postnasal drip and sore throat. Negative for ear pain, rhinorrhea, sinus pressure and sinus pain.   Respiratory: Positive for cough. Negative for apnea, choking, chest tightness, shortness of breath, wheezing and stridor.   Cardiovascular: Positive for chest pain.  Gastrointestinal: Negative for abdominal pain.  Genitourinary: Negative for dysuria.  Skin: Negative for color change, pallor, rash and wound.  Neurological: Negative for dizziness, seizures, syncope, numbness and headaches.     Physical Exam Triage Vital Signs ED Triage Vitals  Enc Vitals Group     BP 10/15/20 0834 (!) 160/98     Pulse Rate 10/15/20 0834 92     Resp 10/15/20 0834 18     Temp 10/15/20 0834 98.2 F (36.8 C)     Temp Source 10/15/20 0834 Oral     SpO2 10/15/20 0834 96 %     Weight 10/15/20 0833 174 lb (78.9 kg)     Height 10/15/20 0833 5\' 3"  (1.6 m)     Head Circumference --      Peak Flow --      Pain Score 10/15/20 0833 0     Pain Loc --      Pain Edu? --      Excl. in [place]? --    No data found.  Updated Vital Signs BP (!) 160/98 (BP Location: Left Arm)   Pulse 92   Temp 98.2 F (36.8 C) (Oral)   Resp 18   Ht 5\' 3"  (1.6 m)   Wt 78.9 kg   SpO2 96%   BMI 30.82 kg/m   Visual Acuity Right Eye Distance:   Left Eye Distance:   Bilateral Distance:    Right Eye Near:   Left Eye Near:    Bilateral Near:     Physical Exam Vitals and nursing note reviewed.  Constitutional:      General: She is not in acute distress.    Appearance: Normal appearance. She is not ill-appearing, toxic-appearing or diaphoretic.  HENT:     Head: Normocephalic and atraumatic.     Right Ear: Tympanic membrane normal.     Left Ear: Tympanic membrane normal.      Nose: Congestion present. No rhinorrhea.     Mouth/Throat:     Mouth: Mucous membranes are moist.     Pharynx: Posterior oropharyngeal erythema present.  Eyes:     Extraocular Movements: Extraocular movements intact.     Conjunctiva/sclera: Conjunctivae normal.     Pupils: Pupils are equal, round, and reactive to light.  Cardiovascular:     Rate and Rhythm: Normal rate and regular rhythm.     Pulses: Normal pulses.  Pulmonary:     Effort: Pulmonary effort is normal. No respiratory distress.     Breath sounds: Normal breath sounds. No stridor. No wheezing, rhonchi or rales.  Musculoskeletal:     Cervical back: Normal range of motion and neck supple. No rigidity or tenderness.  Lymphadenopathy:     Cervical: Cervical adenopathy present.  Skin:    General: Skin is warm and dry.     Capillary Refill: Capillary refill takes less than 2 seconds.  Neurological:     General: No focal deficit present.     Mental Status: She is alert and oriented to person, place, and time.      UC Treatments / Results  Labs (all labs ordered are listed, but only abnormal results are displayed) Labs Reviewed  RESP PANEL BY RT-PCR (FLU A&B, COVID) [id number]    EKG   Radiology No results found.  Procedures Procedures (including critical care time)  Medications Ordered in UC Medications - No data to display  Initial Impression / Assessment and Plan / UC Course  I have reviewed the triage vital signs and the  nursing notes.  Pertinent labs & imaging results that were available during my care of the patient were reviewed by me and considered in my medical decision making (see chart for details).  Clinical impression:  2 weeks of cough worse for the past 3 days with Covid exposure.  Patient has body aches fevers and chills.  She has been vaccinated.  Complicating her situation is it appears as though she has a history of COPD and emphysema.  Exam is reassuring today.  Treatment plan:  1 the  findings and treatment plan were discussed in detail with the patient.  Patient was in agreement. 2. Recommended going ahead and getting Covid and influenza testing today. 3.  Covid and influenza testing was negative here in the office. 4.  Patient has albuterol and an inhaled steroid and she can use that as needed. 5.  Just recommended supportive care, plenty of rest, plenty of fluids.  Practice social distancing and mask wearing if there are elderly family members around during the holiday season. 6.  We will give educational handout on Covid, but the best prudent way to treat this is just with watchful waiting.  No antibiotics and no intervention at this time.  Again exam is reassuring. 7.  Follow-up here as needed.      Final Clinical Impressions(s) / UC Diagnoses   Final diagnoses:  Viral URI with cough  Cough with exposure to COVID-19 virus     Discharge Instructions     Recommended going ahead and getting Covid and influenza testing today. Covid and influenza as well as strep testing was negative here in the office. Patient has albuterol and an inhaled steroid and she can use that as needed. Just recommended supportive care, plenty of rest, plenty of fluids.  Practice social distancing and mask wearing if there are elderly family members around during the holiday season. We will give educational handout on Covid, but the best prudent way to treat this is just with watchful waiting.  No antibiotics and no intervention at this time.  Again exam is reassuring. Follow-up here as needed.    ED Prescriptions    None     PDMP not reviewed this encounter.   Verda Cumins, MD 10/15/20 1052

## 2020-10-15 NOTE — Discharge Instructions (Addendum)
Recommended going ahead and getting Covid and influenza testing today. Covid and influenza as well as strep testing was negative here in the office. Patient has albuterol and an inhaled steroid and she can use that as needed. Just recommended supportive care, plenty of rest, plenty of fluids.  Practice social distancing and mask wearing if there are elderly family members around during the holiday season. We will give educational handout on Covid, but the best prudent way to treat this is just with watchful waiting.  No antibiotics and no intervention at this time.  Again exam is reassuring. Follow-up here as needed.

## 2020-10-19 ENCOUNTER — Telehealth: Payer: Self-pay

## 2020-10-19 ENCOUNTER — Telehealth: Payer: Medicaid Other | Admitting: General Practice

## 2020-10-19 ENCOUNTER — Ambulatory Visit: Payer: Self-pay | Admitting: General Practice

## 2020-10-19 DIAGNOSIS — I10 Essential (primary) hypertension: Secondary | ICD-10-CM

## 2020-10-19 DIAGNOSIS — J209 Acute bronchitis, unspecified: Secondary | ICD-10-CM

## 2020-10-19 DIAGNOSIS — M0579 Rheumatoid arthritis with rheumatoid factor of multiple sites without organ or systems involvement: Secondary | ICD-10-CM

## 2020-10-19 DIAGNOSIS — M797 Fibromyalgia: Secondary | ICD-10-CM

## 2020-10-19 DIAGNOSIS — J440 Chronic obstructive pulmonary disease with acute lower respiratory infection: Secondary | ICD-10-CM

## 2020-10-19 NOTE — Patient Instructions (Signed)
Visit Information  Goals Addressed              This Visit's Progress   .  RNCM: Pt- "My blood pressure is always high" (pt-stated)        CARE PLAN ENTRY (see longtitudinal plan of care for additional care plan information)  Current Barriers:  . Chronic Disease Management support, education, and care coordination needs related to HTN and COPD  Clinical Goal(s) related to HTN and COPD:  Over the next 120 days, patient will:  . Work with the care management team to address educational, disease management, and care coordination needs  . Begin or continue self health monitoring activities as directed today Measure and record blood pressure 3/4 times per week and adhere to a heart healthy diet . Call provider office for new or worsened signs and symptoms Blood pressure findings outside established parameters, Chest pain, Shortness of breath, and New or worsened symptom related to COPD/HTN or other chronic conditions . Call care management team with questions or concerns . Verbalize basic understanding of patient centered plan of care established today  Interventions related to HTN and COPD:  . Evaluation of current treatment plans and patient's adherence to plan as established by provider.  The patient admits she does not always take her medications as prescribed. Education on the risk of heart attack and stroke with elevated blood pressure. The patient states she is trying to make it a priority to take her medications as prescribed.  . Assessed patient understanding of disease states.  The patient states she understands her conditions but she deals with chronic pain and discomfort and she does not always take care of her self the way she should . Assessed patient's education and care coordination needs.  Education on taking medications as directed, utilization of a pill box and reminders. Discussed taking blood pressure and recording. Education on following a heart healthy diet. 10-19-2020:  The patient states she is doing okay. Recovering from an URI. Does not have COVID, Is feeling better each day.  . Provided disease specific education to patient. Review of the DASH diet and monitoring blood pressures. 10-19-2020: The patient did not have readings for the [facility] today . Collaborated with appropriate clinical care team members regarding patient needs.  Has referrals in place with LCSW and pharmacist.   Patient Self Care Activities related to HTN and COPD:  . Patient is unable to independently self-manage chronic health conditions  Please see past updates related to this goal by clicking on the "Past Updates" button in the selected goal      .  RNCM: Pt-"I have extreme pain everyday" (pt-stated)        CARE PLAN ENTRY (see longitudinal plan of care for additional care plan information)  Current Barriers:  Marland Kitchen Knowledge Deficits related to chronic pain, RA,  and fibromyalgia and how to effectively manage . Care Coordination needs related to food resources, transportation, and help with utilities  in a patient with Chronic pain, RA and fibromyalgia . Chronic Disease Management support and education needs related to uncontrolled chronic pain, RA, and fibromyalgia . Lacks caregiver support. Home alone during the day . Transportation barriers . Non-adherence to scheduled provider appointments . Non-adherence to prescribed medication regimen  Nurse Case Manager Clinical Goal(s):  Marland Kitchen Over the next 120 days, patient will verbalize understanding of plan for management of uncontrolled pain and discomfort . Over the next 120 days, patient will work with [facility], CCM team, specialist and pcp to  address needs related to management of chronic pain, fibromyalgia and RA . Over the next 120 days, patient will demonstrate a decrease in pain exacerbations as evidenced by treatment regimen including RX and therapy to help patient with pain relief of chronic conditions  . Over the next 120 days, patient  will attend all scheduled medical appointments: next appointment with the pcp on 09-13-2020.  Marland Kitchen Over the next 120 days, patient will demonstrate improved adherence to prescribed treatment plan for pain management  as evidenced bydecreased level of pain and discomfort and effective management of pain for chronic pain, RA, and fibromyalgia . Over the next 120 days, patient will work with CM team pharmacist to reconcile medications and help with recommendations for controlling pain . Over the next 120 days, patient will work with CM clinical social worker to depression and anxiety related to dealing with chronic pain and discomfort . Over the next 120 days, patient will work with care guides  (community agency) to help with food resources, transportation and help with utilities . Over the next 120 days, the patient will demonstrate ongoing self health care management ability as evidenced by taking medications as prescribed, working with physical therapy and CCM team to meet her health and wellness goals.   Interventions:  . Inter-disciplinary care team collaboration (see longitudinal plan of care) . Evaluation of current treatment plan related to chronic pain, fibromyalgia and RA and patient's adherence to plan as established by provider. . Advised patient to to call the pcp or specialist for increased uncontrolled pain and discomfort.  . Provided education to patient re: alternative pain relief methods. The patient was tearful on the call and states that she lives with pain daily. Some days are better than others but she is debilitated by her pain and discomfort. Education on starting therapy soon and the patient is hopeful this will help her. 10-19-2020: The patient was having a good day today. The patient denies any acute issues with her pain today.  . Reviewed medications with patient and discussed compliance. The patient admits she does not always take her medication as prescribed. Discussed compliance  and following recommendations of her providers . Collaborated with CCM team and pcp regarding tearful encounter and the patients stated pain and discomfort on a daily basis . Discussed plans with patient for ongoing care management follow up and provided patient with direct contact information for care management team . Reviewed scheduled/upcoming provider appointments including: pcp 11-30-2020 . Care Guide referral for food resources, transportation resources, and utility assistance . Social Work referral for emotional support, depression, anxiety, therapy in dealing with chronic pain and discomfort.  Currently working with SW . Pharmacy referral for medication reconciliation and recommendations. Appointment 09-10-2020  Patient Self Care Activities:  . Patient verbalizes understanding of plan to work with the CCM team, pcp, and specialist to manage chronic pain, fibromyalgia, and RA . Self administers medications as prescribed . Attends all scheduled provider appointments . Calls pharmacy for medication refills . Performs IADL's independently . Calls provider office for new concerns or questions . Unable to independently manage pain and discomfort as evidence of uncontrolled pain, tearful on the outreach call today . Does not adhere to provider recommendations re:  Marland Kitchen Does not attend all scheduled provider appointments . Does not adhere to prescribed medication regimen . Lacks social connections  Please see past updates related to this goal by clicking on the "Past Updates" button in the selected goal  The patient verbalized understanding of instructions, educational materials, and care plan provided today and declined offer to receive copy of patient instructions, educational materials, and care plan.   Telephone follow up appointment with care management team member scheduled for: 12-17-2020 at 1 pm  Noreene Larsson RN, MSN, [facility] Mobile: [phone]

## 2020-10-19 NOTE — Telephone Encounter (Signed)
Copied from CRM [id number]. Topic: General - Other >> Oct 19, 2020  9:09 AM Wyonia Hough E wrote: Reason for CRM: Pt would like a call to go over lab results/ please advise

## 2020-10-19 NOTE — Telephone Encounter (Signed)
Called and discussed lab results with patient. Patient verbalized understanding and scheduled follow up visit.

## 2020-10-19 NOTE — Chronic Care Management (AMB) (Signed)
Chronic Care Management   Follow Up Note   10/19/2020 Name: Catherine Giles MRN: [id number] DOB: November 07, 1970  Referred by: Guadalupe Maple, MD Reason for referral : Chronic Care Management (RNCM: Follow up for Chronic Disease Management and Care Coordination Needs)   Catherine Giles is a 49 y.o. year old female who is a primary care patient of [name], [name], MD. The CCM team was consulted for assistance with chronic disease management and care coordination needs.    Review of patient status, including review of consultants reports, relevant laboratory and other test results, and collaboration with appropriate care team members and the patient's provider was performed as part of comprehensive patient evaluation and provision of chronic care management services.    SDOH (Social Determinants of Health) assessments performed: Yes See Care Plan activities for detailed interventions related to [facility])     Outpatient Encounter Medications as of 10/19/2020  Medication Sig   albuterol (VENTOLIN HFA) 108 (90 Base) MCG/ACT inhaler Inhale 2 puffs into the lungs every 4 (four) hours as needed.    amLODipine (NORVASC) 5 MG tablet Take 1 tablet (5 mg total) by mouth daily.   chlorhexidine (PERIDEX) 0.12 % solution 15 mLs 2 (two) times daily.   Cholecalciferol 1.25 MG (50000 UT) TABS Take 1 tablet by mouth once a week. For 8 weeks and then stop.  Return to office for lab draw.   fluticasone (FLONASE) 50 MCG/ACT nasal spray Place 2 sprays into both nostrils daily.   gabapentin (NEURONTIN) 100 MG capsule Take by mouth.   ibuprofen (ADVIL) 400 MG tablet Take by mouth.   ipratropium-albuterol (DUONEB) 0.5-2.5 (3) MG/3ML SOLN Take 3 mLs by nebulization every 6 (six) hours as needed for up to 7 days.   levofloxacin (LEVAQUIN) 500 MG tablet Take 500 mg by mouth daily.   levothyroxine (SYNTHROID) 50 MCG tablet Take 50 mcg by mouth daily before breakfast.    liothyronine (CYTOMEL) 5 MCG tablet Take 5  mcg by mouth daily.   losartan (COZAAR) 100 MG tablet Take 100 mg by mouth daily.    mupirocin cream (BACTROBAN) 2 % Apply 1 application topically 2 (two) times daily. Apply on open sores to buttocks as needed.   predniSONE (DELTASONE) 10 MG tablet Take 10 mg by mouth daily.   tiZANidine (ZANAFLEX) 4 MG tablet Take 1 tablet (4 mg total) by mouth every 8 (eight) hours as needed for muscle spasms.   traMADol (ULTRAM) 50 MG tablet Take 50 mg by mouth every 6 (six) hours as needed.    triamcinolone cream (KENALOG) 0.1 % Apply 1 application topically daily as needed. Apply to affected areas twice daily as needed. Avoid Face,Groin and underarms   umeclidinium-vilanterol (ANORO ELLIPTA) 62.5-25 MCG/INH AEPB Inhale 1 puff into the lungs daily in the afternoon.    [DISCONTINUED] traZODone (DESYREL) 50 MG tablet Take 1 tablet (50 mg total) by mouth at bedtime.   No facility-administered encounter medications on file as of 10/19/2020.     Objective:   Goals Addressed              This Visit's Progress     RNCM: Pt- "My blood pressure is always high" (pt-stated)        CARE PLAN ENTRY (see longtitudinal plan of care for additional care plan information)  Current Barriers:   Chronic Disease Management support, education, and care coordination needs related to HTN and COPD  Clinical Goal(s) related to HTN and COPD:  Over the next 120 days, patient  will:   Work with the care management team to address educational, disease management, and care coordination needs   Begin or continue self health monitoring activities as directed today Measure and record blood pressure 3/4 times per week and adhere to a heart healthy diet  Call provider office for new or worsened signs and symptoms Blood pressure findings outside established parameters, Chest pain, Shortness of breath, and New or worsened symptom related to COPD/HTN or other chronic conditions  Call care management team with questions or  concerns  Verbalize basic understanding of patient centered plan of care established today  Interventions related to HTN and COPD:   Evaluation of current treatment plans and patient's adherence to plan as established by provider.  The patient admits she does not always take her medications as prescribed. Education on the risk of heart attack and stroke with elevated blood pressure. The patient states she is trying to make it a priority to take her medications as prescribed.   Assessed patient understanding of disease states.  The patient states she understands her conditions but she deals with chronic pain and discomfort and she does not always take care of her self the way she should  Assessed patient's education and care coordination needs.  Education on taking medications as directed, utilization of a pill box and reminders. Discussed taking blood pressure and recording. Education on following a heart healthy diet. 10-19-2020: The patient states she is doing okay. Recovering from an URI. Does not have COVID, Is feeling better each day.   Provided disease specific education to patient. Review of the DASH diet and monitoring blood pressures. 10-19-2020: The patient did not have readings for the [facility] today  Collaborated with appropriate clinical care team members regarding patient needs.  Has referrals in place with LCSW and pharmacist.   Patient Self Care Activities related to HTN and COPD:   Patient is unable to independently self-manage chronic health conditions  Please see past updates related to this goal by clicking on the "Past Updates" button in the selected goal        RNCM: Pt-"I have extreme pain everyday" (pt-stated)        CARE PLAN ENTRY (see longitudinal plan of care for additional care plan information)  Current Barriers:   Knowledge Deficits related to chronic pain, RA,  and fibromyalgia and how to effectively manage  Care Coordination needs related to food resources,  transportation, and help with utilities  in a patient with Chronic pain, RA and fibromyalgia  Chronic Disease Management support and education needs related to uncontrolled chronic pain, RA, and fibromyalgia  Lacks caregiver support. Home alone during the day  Transportation barriers  Non-adherence to scheduled provider appointments  Non-adherence to prescribed medication regimen  Nurse Case Manager Clinical Goal(s):   Over the next 120 days, patient will verbalize understanding of plan for management of uncontrolled pain and discomfort  Over the next 120 days, patient will work with [facility], CCM team, specialist and pcp to address needs related to management of chronic pain, fibromyalgia and RA  Over the next 120 days, patient will demonstrate a decrease in pain exacerbations as evidenced by treatment regimen including RX and therapy to help patient with pain relief of chronic conditions   Over the next 120 days, patient will attend all scheduled medical appointments: next appointment with the pcp on 09-13-2020.   Over the next 120 days, patient will demonstrate improved adherence to prescribed treatment plan for pain management  as evidenced bydecreased  level of pain and discomfort and effective management of pain for chronic pain, RA, and fibromyalgia  Over the next 120 days, patient will work with CM team pharmacist to reconcile medications and help with recommendations for controlling pain  Over the next 120 days, patient will work with CM clinical social worker to depression and anxiety related to dealing with chronic pain and discomfort  Over the next 120 days, patient will work with care guides  (community agency) to help with food resources, transportation and help with utilities  Over the next 120 days, the patient will demonstrate ongoing self health care management ability as evidenced by taking medications as prescribed, working with physical therapy and CCM team to meet her  health and wellness goals.   Interventions:   Inter-disciplinary care team collaboration (see longitudinal plan of care)  Evaluation of current treatment plan related to chronic pain, fibromyalgia and RA and patient's adherence to plan as established by provider.  Advised patient to to call the pcp or specialist for increased uncontrolled pain and discomfort.   Provided education to patient re: alternative pain relief methods. The patient was tearful on the call and states that she lives with pain daily. Some days are better than others but she is debilitated by her pain and discomfort. Education on starting therapy soon and the patient is hopeful this will help her. 10-19-2020: The patient was having a good day today. The patient denies any acute issues with her pain today.   Reviewed medications with patient and discussed compliance. The patient admits she does not always take her medication as prescribed. Discussed compliance and following recommendations of her providers  Collaborated with CCM team and pcp regarding tearful encounter and the patients stated pain and discomfort on a daily basis  Discussed plans with patient for ongoing care management follow up and provided patient with direct contact information for care management team  Reviewed scheduled/upcoming provider appointments including: pcp 11-30-2020  Care Guide referral for food resources, transportation resources, and utility assistance  Social Work referral for emotional support, depression, anxiety, therapy in dealing with chronic pain and discomfort.  Currently working with SW  Pharmacy referral for medication reconciliation and recommendations. Appointment 09-10-2020  Patient Self Care Activities:   Patient verbalizes understanding of plan to work with the CCM team, pcp, and specialist to manage chronic pain, fibromyalgia, and RA  Self administers medications as prescribed  Attends all scheduled provider  appointments  Calls pharmacy for medication refills  Performs IADL's independently  Calls provider office for new concerns or questions  Unable to independently manage pain and discomfort as evidence of uncontrolled pain, tearful on the outreach call today  Does not adhere to provider recommendations re:   Does not attend all scheduled provider appointments  Does not adhere to prescribed medication regimen  Lacks social connections  Please see past updates related to this goal by clicking on the "Past Updates" button in the selected goal           Plan:   Telephone follow up appointment with care management team member scheduled for: 12-17-2020 at 1 pm   Alto Denver RN, MSN, CCM Community Care Coordinator [place]   [facility] Mobile: [phone]

## 2020-10-21 DIAGNOSIS — M069 Rheumatoid arthritis, unspecified: Secondary | ICD-10-CM | POA: Diagnosis not present

## 2020-10-21 DIAGNOSIS — M0579 Rheumatoid arthritis with rheumatoid factor of multiple sites without organ or systems involvement: Secondary | ICD-10-CM | POA: Diagnosis not present

## 2020-10-21 DIAGNOSIS — Z79899 Other long term (current) drug therapy: Secondary | ICD-10-CM | POA: Diagnosis not present

## 2020-10-21 DIAGNOSIS — M797 Fibromyalgia: Secondary | ICD-10-CM | POA: Diagnosis not present

## 2020-10-25 ENCOUNTER — Ambulatory Visit (INDEPENDENT_AMBULATORY_CARE_PROVIDER_SITE_OTHER): Payer: Medicare Other

## 2020-10-25 ENCOUNTER — Ambulatory Visit
Admission: RE | Admit: 2020-10-25 | Discharge: 2020-10-25 | Disposition: A | Discharge status: Home / Self Care | Payer: Medicare Other | Source: Ambulatory Visit | Attending: Sports Medicine | Admitting: Sports Medicine

## 2020-10-25 ENCOUNTER — Other Ambulatory Visit: Payer: Self-pay

## 2020-10-25 VITALS — BP 160/90 | HR 93 | Temp 97.9°F | Resp 28 | Ht 63.0 in | Wt 170.0 lb

## 2020-10-25 DIAGNOSIS — Z20822 Contact with and (suspected) exposure to covid-19: Secondary | ICD-10-CM | POA: Diagnosis not present

## 2020-10-25 DIAGNOSIS — R059 Cough, unspecified: Secondary | ICD-10-CM | POA: Diagnosis not present

## 2020-10-25 DIAGNOSIS — R0602 Shortness of breath: Secondary | ICD-10-CM | POA: Diagnosis not present

## 2020-10-25 DIAGNOSIS — H1032 Unspecified acute conjunctivitis, left eye: Secondary | ICD-10-CM | POA: Insufficient documentation

## 2020-10-25 DIAGNOSIS — J441 Chronic obstructive pulmonary disease with (acute) exacerbation: Secondary | ICD-10-CM

## 2020-10-25 DIAGNOSIS — R5383 Other fatigue: Secondary | ICD-10-CM | POA: Diagnosis not present

## 2020-10-25 LAB — SARS CORONAVIRUS 2 (TAT 6-24 HRS): SARS Coronavirus 2: NEGATIVE

## 2020-10-25 MED ORDER — DOXYCYCLINE HYCLATE 100 MG PO CAPS: 100.0000 mg | ORAL_CAPSULE | Freq: Two times a day (BID) | ORAL | 0 refills | Status: AC

## 2020-10-25 MED ORDER — CIPROFLOXACIN HCL 0.3 % OP SOLN: OPHTHALMIC | 0 refills | Status: DC

## 2020-10-25 MED ORDER — PREDNISONE 10 MG PO TABS: ORAL_TABLET | ORAL | 0 refills | Status: DC

## 2020-10-25 MED ORDER — HYDROCOD POLST-CPM POLST ER 10-8 MG/5ML PO SUER: 5.0000 mL | Freq: Two times a day (BID) | ORAL | 0 refills | Status: DC | PRN

## 2020-10-25 NOTE — ED Triage Notes (Signed)
Pt c/o cough, diarrhea, headache. Started over a week ago , but worse in the last 3 days. Denies fever.

## 2020-10-25 NOTE — Discharge Instructions (Signed)

## 2020-10-25 NOTE — ED Notes (Signed)
Pt tachypneic on exertion/ambulation to room. SaO2 95% RA, 28 RR, bilateral lung sounds diminished with wheezes right upper lobe. States took duoneb this morning at approx 0830. Denies fever.

## 2020-10-25 NOTE — ED Provider Notes (Signed)
[facility]    CSN: [id number] Arrival date & time: 10/25/20  1007      History   Chief Complaint Chief Complaint  Patient presents with  . Cough  . Diarrhea  . Headache  . Appointment    TRIAGED 10:40    HPI Catherine Giles is a 50 y.o. female presenting for approximately 10-day history of cough, diarrhea, and headaches.  Patient states that over the past 3 days her symptoms of gotten worse.  Patient says that she was starting to feel better but then her daughter got sick and she suddenly got worse, especially the cough.  Patient states that she is taken everything over-the-counter for cough and also something with codeine in it for cough and did not get any relief.  She states that she cannot sleep at night because her cough is so bad.  She says that when she coughs she does get some thick clear fluid up.  Admits to some chest tightness and increased breathing difficulty.  Patient states she has been doing her at home inhalers and nebulizer/breathing treatments.  Additionally, patient complains of left eye redness and yellowish drainage.  Patient states that her grandchildren have pinkeye.    Patient denies known COVID-19 exposure and has been fully vaccinated for COVID-19.  Past medical history is significant for COPD, lupus, hypertension, prediabetes, rheumatoid arthritis, and thyroid disease.  Patient denies any fever, weakness, abdominal pain, N/V, or changes in smell and taste.  She has no other complaints or concerns today.  HPI  Past Medical History:  Diagnosis Date  . Arthritis    RA  . COPD (chronic obstructive pulmonary disease) ([place])   . Emphysema of lung ([place])   . Family history of breast cancer    6/21 cancer genetic testing letter sent  . Hypertension   . Lupus ([place])   . Pre-diabetes   . Thyroid disease     Patient Active Problem List   Diagnosis Date Noted  . Memory changes 10/12/2020  . Dairy allergy 10/12/2020  . Headache 10/12/2020  .  Vaginal stenosis 03/19/2020  . Obesity 03/19/2020  . Insomnia 01/05/2020  . Complex regional pain syndrome I of upper limb 12/17/2019  . Essential hypertension 12/13/2019  . Centrilobular emphysema ([place]) 12/13/2019  . Elevated hemoglobin A1c 12/13/2019  . Hypothyroid 12/13/2019  . DDD (degenerative disc disease), lumbar 08/26/2019  . Multinodular goiter 08/14/2019  . Chronic left hip pain 05/05/2019  . Osteoarthritis of spine with radiculopathy, cervical region 05/05/2019  . Encounter for long-term (current) use of high-risk medication 02/14/2019  . Fibromyalgia 02/10/2019  . Rheumatoid arthritis involving multiple sites with positive rheumatoid factor ([place]) 02/10/2019  . Undifferentiated connective tissue disease ([place]) 02/10/2019  . Cervical radiculopathy 03/13/2018  . Vitamin D deficiency 10/11/2015    Past Surgical History:  Procedure Laterality Date  . MOUTH SURGERY      OB History    Gravida  9   Para  6   Term      Preterm      AB  3   Living        SAB  2   IAB      Ectopic      Multiple      Live Births               Home Medications    Prior to Admission medications   Medication Sig Start Date End Date Taking? Authorizing Provider  amLODipine (NORVASC) 5 MG tablet  Take 1 tablet (5 mg total) by mouth daily. 10/12/20  Yes Cannady, Jolene T, NP  chlorhexidine (PERIDEX) 0.12 % solution 15 mLs 2 (two) times daily. 06/03/20  Yes [provider]  chlorpheniramine-HYDROcodone (TUSSIONEX PENNKINETIC ER) 10-8 MG/5ML SUER Take 5 mLs by mouth every 12 (twelve) hours as needed for cough. 10/25/20  Yes Danton Clap, PA-C  Cholecalciferol 1.25 MG (50000 UT) TABS Take 1 tablet by mouth once a week. For 8 weeks and then stop.  Return to office for lab draw. 10/13/20  Yes Cannady, Jolene T, NP  ciprofloxacin (CILOXAN) 0.3 % ophthalmic solution Administer 1 drop, every 2 hours, while awake, for 2 days. Then 1 drop, every 4 hours, while awake, for the next  5 days. 10/25/20  Yes Laurene Footman B, PA-C  doxycycline (VIBRAMYCIN) 100 MG capsule Take 1 capsule (100 mg total) by mouth 2 (two) times daily for 7 days. 10/25/20 11/01/20 Yes Laurene Footman B, PA-C  fluticasone (FLONASE) 50 MCG/ACT nasal spray Place 2 sprays into both nostrils daily. 07/01/20  Yes Rodriguez-Southworth, Sunday Spillers, PA-C  gabapentin (NEURONTIN) 100 MG capsule Take by mouth. 09/01/20  Yes [provider]  ibuprofen (ADVIL) 400 MG tablet Take by mouth. 06/03/20  Yes [provider]  levofloxacin (LEVAQUIN) 500 MG tablet Take 500 mg by mouth daily. 06/01/20  Yes [provider]  levothyroxine (SYNTHROID) 50 MCG tablet Take 50 mcg by mouth daily before breakfast.    Yes [provider]  liothyronine (CYTOMEL) 5 MCG tablet Take 5 mcg by mouth daily. 08/05/20  Yes [provider]  losartan (COZAAR) 100 MG tablet Take 100 mg by mouth daily.    Yes [provider]  mupirocin cream (BACTROBAN) 2 % Apply 1 application topically 2 (two) times daily. Apply on open sores to buttocks as needed. 08/23/20  Yes Eulogio Bear, NP  predniSONE (DELTASONE) 10 MG tablet Take 6 tabs by mouth daily on the first day and decrease by 1 tablet for the next 5 days. 10/25/20  Yes Laurene Footman B, PA-C  tiZANidine (ZANAFLEX) 4 MG tablet Take 1 tablet (4 mg total) by mouth every 8 (eight) hours as needed for muscle spasms. 08/23/20  Yes Eulogio Bear, NP  traMADol (ULTRAM) 50 MG tablet Take 50 mg by mouth every 6 (six) hours as needed.    Yes [provider]  triamcinolone cream (KENALOG) 0.1 % Apply 1 application topically daily as needed. Apply to affected areas twice daily as needed. Avoid Face,Groin and underarms 03/23/20  Yes Brendolyn Patty, MD  umeclidinium-vilanterol [facility] ELLIPTA) 62.5-25 MCG/INH AEPB Inhale 1 puff into the lungs daily in the afternoon.  02/12/19  Yes [provider]  albuterol (VENTOLIN HFA) 108 (90 Base) MCG/ACT inhaler Inhale  2 puffs into the lungs every 4 (four) hours as needed.  02/12/19 10/15/20  [provider]  ipratropium-albuterol (DUONEB) 0.5-2.5 (3) MG/3ML SOLN Take 3 mLs by nebulization every 6 (six) hours as needed for up to 7 days. 06/12/20 06/19/20  Danton Clap, PA-C  traZODone (DESYREL) 50 MG tablet Take 1 tablet (50 mg total) by mouth at bedtime. 01/05/20 07/01/20  Venita Lick, NP    Family History Family History  Problem Relation Age of Onset  . Lung cancer Mother   . Diabetes Mother   . Breast cancer Mother 61  . Hypertension Father   . Cancer Maternal Aunt   . Ovarian cancer Maternal Aunt   . Diabetes Maternal Aunt   . Diabetes  Maternal Uncle   . Hypertension Maternal Grandmother   . Diabetes Maternal Grandmother     Social History Social History   Tobacco Use  . Smoking status: Current Every Day Smoker    Packs/day: 1.00    Years: 34.00    Pack years: 34.00    Types: Cigarettes  . Smokeless tobacco: Never Used  . Tobacco comment: 2-3 cig/day  Vaping Use  . Vaping Use: Never used  Substance Use Topics  . Alcohol use: Yes    Comment: Occ  . Drug use: Not Currently     Allergies   Azithromycin, Penicillins, and Shellfish allergy   Review of Systems Review of Systems  Constitutional: Positive for fatigue. Negative for chills, diaphoresis and fever.  HENT: Positive for rhinorrhea. Negative for congestion, ear pain, sinus pressure, sinus pain and sore throat.   Respiratory: Positive for cough, chest tightness and wheezing. Negative for shortness of breath.   Gastrointestinal: Positive for diarrhea. Negative for abdominal pain, nausea and vomiting.  Musculoskeletal: Negative for arthralgias and myalgias.  Skin: Negative for rash.  Neurological: Negative for weakness and headaches.  Hematological: Negative for adenopathy.     Physical Exam Triage Vital Signs ED Triage Vitals  Enc Vitals Group     BP 10/25/20 1106 (!) 160/90     Pulse Rate 10/25/20 1106  93     Resp 10/25/20 1106 (!) 28     Temp 10/25/20 1106 97.9 F (36.6 C)     Temp Source 10/25/20 1106 Oral     SpO2 10/25/20 1106 95 %     Weight 10/25/20 1034 170 lb (77.1 kg)     Height 10/25/20 1034 5\' 3"  (1.6 m)     Head Circumference --      Peak Flow --      Pain Score 10/25/20 1033 6     Pain Loc --      Pain Edu? --      Excl. in GC? --    No data found.  Updated Vital Signs BP (!) 160/90 (BP Location: Right Arm)   Pulse 93   Temp 97.9 F (36.6 C) (Oral)   Resp (!) 28   Ht 5\' 3"  (1.6 m)   Wt 170 lb (77.1 kg)   SpO2 95%   BMI 30.11 kg/m       Physical Exam Vitals and nursing note reviewed.  Constitutional:      General: She is not in acute distress.    Appearance: Normal appearance. She is ill-appearing. She is not toxic-appearing.  HENT:     Head: Normocephalic and atraumatic.     Nose: Nose normal.     Mouth/Throat:     Mouth: Mucous membranes are moist.     Pharynx: Oropharynx is clear.  Eyes:     General: No scleral icterus.       Right eye: No discharge.        Left eye: Discharge (mild injection and light yellow drainage) present. Cardiovascular:     Rate and Rhythm: Normal rate and regular rhythm.     Heart sounds: Normal heart sounds.  Pulmonary:     Effort: Pulmonary effort is normal. No respiratory distress.     Breath sounds: Wheezing (mild wheezes throughout bilateral chest) present. No rhonchi or rales.  Musculoskeletal:     Cervical back: Neck supple.  Skin:    General: Skin is dry.  Neurological:     General: No focal deficit present.  Mental Status: She is alert. Mental status is at baseline.     Motor: No weakness.     Gait: Gait normal.  Psychiatric:        Mood and Affect: Mood normal.        Behavior: Behavior normal.        Thought Content: Thought content normal.      UC Treatments / Results  Labs (all labs ordered are listed, but only abnormal results are displayed) Labs Reviewed  SARS CORONAVIRUS 2 (TAT 6-24  HRS)    EKG   Radiology DG Chest 2 View  Result Date: 10/25/2020 CLINICAL DATA:  sob EXAM: CHEST - 2 VIEW COMPARISON:  07/01/2020 and prior. FINDINGS: No focal consolidation. No pneumothorax or pleural effusion. Left basilar scarring, unchanged. Cardiomediastinal silhouette is within normal limits. No acute osseous abnormality. IMPRESSION: No focal airspace disease. Electronically Signed   By: Primitivo Gauze M.D.   On: 10/25/2020 11:43    Procedures Procedures (including critical care time)  Medications Ordered in UC Medications - No data to display  Initial Impression / Assessment and Plan / UC Course  I have reviewed the triage vital signs and the nursing notes.  Pertinent labs & imaging results that were available during my care of the patient were reviewed by me and considered in my medical decision making (see chart for details).   50 year old female presenting for 1-1/2 to 2-week history of worsening cough.  Patient is afebrile in clinic.  She is ill-appearing, but not toxic.  Oxygen is 95%.  She does have diffuse mild wheezing throughout chest.  Chest x-ray obtained which does not show any acute cardiopulmonary disease.  I suspect she is having COPD exacerbation.  Covid has a obtained.  Current CDC guidelines, isolation protocol, and ED precautions reviewed with patient.  Treating COPD exacerbation with doxycycline, prednisone, and Tussionex.  Reviewed controlled substance database and discussed safe use of this medication.  Also treating conjunctivitis with Cipro drops.  Advised patient to follow-up with [place] as needed for any new or worsening symptoms.  ED precautions thoroughly reviewed with patient.   Final Clinical Impressions(s) / UC Diagnoses   Final diagnoses:  COPD exacerbation (HCC)  Cough  Fatigue, unspecified type  Shortness of breath  Acute bacterial conjunctivitis of left eye     Discharge Instructions     You have received COVID testing today  either for positive exposure, concerning symptoms that could be related to COVID infection, screening purposes, or re-testing after confirmed positive.  Your test obtained today checks for active viral infection in the last 1-2 weeks. If your test is negative now, you can still test positive later. So, if you do develop symptoms you should either get re-tested and/or isolate x 5 days and then strict mask use x 5 days (unvaccinated) or mask use x 10 days (vaccinated). Please follow CDC guidelines.  While Rapid antigen tests come back in 15-20 minutes, send out PCR/molecular test results typically come back within 1-3 days. In the mean time, if you are symptomatic, assume this could be a positive test and treat/monitor yourself as if you do have COVID.   We will call with test results if positive. Please download the MyChart app and set up a profile to access test results.   If symptomatic, go home and rest. Push fluids. Take Tylenol as needed for discomfort. Gargle warm salt water. Throat lozenges. Take Mucinex DM or Robitussin for cough. Humidifier in bedroom to ease coughing. Warm showers.  Also review the COVID handout for more information.  COVID-19 INFECTION: The incubation period of COVID-19 is approximately 14 days after exposure, with most symptoms developing in roughly 4-5 days. Symptoms may range in severity from mild to critically severe. Roughly 80% of those infected will have mild symptoms. People of any age may become infected with COVID-19 and have the ability to transmit the virus. The most common symptoms include: fever, fatigue, cough, body aches, headaches, sore throat, nasal congestion, shortness of breath, nausea, vomiting, diarrhea, changes in smell and/or taste.    COURSE OF ILLNESS Some patients may begin with mild disease which can progress quickly into critical symptoms. If your symptoms are worsening please call ahead to the Emergency Department and proceed there for further  treatment. Recovery time appears to be roughly 1-2 weeks for mild symptoms and 3-6 weeks for severe disease.   GO IMMEDIATELY TO ER FOR FEVER YOU ARE UNABLE TO GET DOWN WITH TYLENOL, BREATHING PROBLEMS, CHEST PAIN, FATIGUE, LETHARGY, INABILITY TO EAT OR DRINK, ETC  QUARANTINE AND ISOLATION: To help decrease the spread of COVID-19 please remain isolated if you have COVID infection or are highly suspected to have COVID infection. This means -stay home and isolate to one room in the home if you live with others. Do not share a bed or bathroom with others while ill, sanitize and wipe down all countertops and keep common areas clean and disinfected. Stay home for 5 days. If you have no symptoms or your symptoms are resolving after 5 days, you can leave your house. Continue to wear a mask around others for 5 additional days. If you have been in close contact (within 6 feet) of someone diagnosed with COVID 19, you are advised to quarantine in your home for 14 days as symptoms can develop anywhere from 2-14 days after exposure to the virus. If you develop symptoms, you  must isolate.  Most current guidelines for COVID after exposure -unvaccinated: isolate 5 days and strict mask use x 5 days. Test on day 5 is possible -vaccinated: wear mask x 10 days if symptoms do not develop -You do not necessarily need to be tested for COVID if you have + exposure and  develop symptoms. Just isolate at home x10 days from symptom onset During this global pandemic, CDC advises to practice social distancing, try to stay at least 25ft away from others at all times. Wear a face covering. Wash and sanitize your hands regularly and avoid going anywhere that is not necessary.  KEEP IN MIND THAT THE COVID TEST IS NOT 100% ACCURATE AND YOU SHOULD STILL DO EVERYTHING TO PREVENT POTENTIAL SPREAD OF VIRUS TO OTHERS (WEAR MASK, WEAR GLOVES, [place] HANDS AND SANITIZE REGULARLY). IF INITIAL TEST IS NEGATIVE, THIS MAY NOT MEAN YOU ARE DEFINITELY  NEGATIVE. MOST ACCURATE TESTING IS DONE 5-7 DAYS AFTER EXPOSURE.   It is not advised by CDC to get re-tested after receiving a positive COVID test since you can still test positive for weeks to months after you have already cleared the virus.   *If you have not been vaccinated for COVID, I strongly suggest you consider getting vaccinated as long as there are no contraindications.      ED Prescriptions    Medication Sig Dispense Auth. Provider   predniSONE (DELTASONE) 10 MG tablet Take 6 tabs by mouth daily on the first day and decrease by 1 tablet for the next 5 days. 21 tablet Laurene Footman B, PA-C   doxycycline (VIBRAMYCIN) 100 MG  capsule Take 1 capsule (100 mg total) by mouth 2 (two) times daily for 7 days. 20 capsule Laurene Footman B, PA-C   chlorpheniramine-HYDROcodone (TUSSIONEX PENNKINETIC ER) 10-8 MG/5ML SUER Take 5 mLs by mouth every 12 (twelve) hours as needed for cough. 70 mL Laurene Footman B, PA-C   ciprofloxacin (CILOXAN) 0.3 % ophthalmic solution Administer 1 drop, every 2 hours, while awake, for 2 days. Then 1 drop, every 4 hours, while awake, for the next 5 days. 5 mL Danton Clap, PA-C     PDMP not reviewed this encounter.   Danton Clap, PA-C 10/25/20 1234

## 2020-11-05 ENCOUNTER — Other Ambulatory Visit: Payer: Self-pay

## 2020-11-05 ENCOUNTER — Telehealth (INDEPENDENT_AMBULATORY_CARE_PROVIDER_SITE_OTHER): Payer: Medicare Other | Admitting: Family Medicine

## 2020-11-05 ENCOUNTER — Encounter: Payer: Self-pay | Admitting: Family Medicine

## 2020-11-05 VITALS — BP 146/90

## 2020-11-05 DIAGNOSIS — Z20822 Contact with and (suspected) exposure to covid-19: Secondary | ICD-10-CM | POA: Diagnosis not present

## 2020-11-05 MED ORDER — HYDROCOD POLST-CPM POLST ER 10-8 MG/5ML PO SUER: 5.0000 mL | Freq: Two times a day (BID) | ORAL | 0 refills | Status: DC | PRN

## 2020-11-05 MED ORDER — PREDNISONE 10 MG PO TABS: ORAL_TABLET | ORAL | 0 refills | Status: DC

## 2020-11-05 MED ORDER — BENZONATATE 200 MG PO CAPS: 200.0000 mg | ORAL_CAPSULE | Freq: Two times a day (BID) | ORAL | 0 refills | Status: DC | PRN

## 2020-11-05 NOTE — Addendum Note (Signed)
Addended by: Dorris Fetch on: 11/05/2020 11:30 AM   Modules accepted: Orders

## 2020-11-05 NOTE — Progress Notes (Signed)
BP (!) 146/90    Subjective:    Patient ID: Catherine Giles, female    DOB: [birth year], 50 y.o.   MRN: [id number]  HPI: Catherine Giles is a 50 y.o. female  Chief Complaint  Patient presents with  . Covid Exposure   UPPER RESPIRATORY TRACT INFECTION Duration: 2 days Worst symptom: cough, congestion, chills Fever: yes Cough: yes Shortness of breath: yes Wheezing: yes Chest pain: yes, with cough Chest tightness: yes Chest congestion: yes Nasal congestion: yes Runny nose: yes Post nasal drip: yes Sneezing: no Sore throat: yes Swollen glands: no Sinus pressure: no Headache: yes Face pain: no Toothache: yes Ear pain: yes  Ear pressure: yes left Eyes red/itching:no Eye drainage/crusting: no  Vomiting: yes Rash: no Fatigue: yes Sick contacts: yes Strep contacts: no  Context: worse Recurrent sinusitis: no Relief with OTC cold/cough medications: no  Treatments attempted: nebulizer   Relevant past medical, surgical, family and social history reviewed and updated as indicated. Interim medical history since our last visit reviewed. Allergies and medications reviewed and updated.  Review of Systems  Constitutional: Positive for chills, diaphoresis and fatigue. Negative for activity change, appetite change, fever and unexpected weight change.  HENT: Positive for congestion, ear pain, postnasal drip, rhinorrhea, sinus pressure and sore throat. Negative for dental problem, drooling, ear discharge, facial swelling, hearing loss, mouth sores, nosebleeds, sinus pain, sneezing, tinnitus, trouble swallowing and voice change.   Respiratory: Positive for cough, chest tightness, shortness of breath and wheezing. Negative for apnea, choking and stridor.   Cardiovascular: Negative.   Gastrointestinal: Negative.   Musculoskeletal: Negative.   Psychiatric/Behavioral: Negative.     Per HPI unless specifically indicated above     Objective:    BP (!) 146/90   Wt Readings from Last 3  Encounters:  10/25/20 170 lb (77.1 kg)  10/15/20 174 lb (78.9 kg)  10/12/20 174 lb (78.9 kg)    Physical Exam Vitals and nursing note reviewed.  Constitutional:      General: She is not in acute distress.    Appearance: Normal appearance. She is not ill-appearing, toxic-appearing or diaphoretic.  HENT:     Head: Normocephalic and atraumatic.     Right Ear: External ear normal.     Left Ear: External ear normal.     Nose: Nose normal.     Mouth/Throat:     Mouth: Mucous membranes are moist.     Pharynx: Oropharynx is clear.  Eyes:     General: No scleral icterus.       Right eye: No discharge.        Left eye: No discharge.     Conjunctiva/sclera: Conjunctivae normal.     Pupils: Pupils are equal, round, and reactive to light.  Pulmonary:     Effort: Pulmonary effort is normal. No respiratory distress.     Comments: Speaking in full sentences Musculoskeletal:        General: Normal range of motion.     Cervical back: Normal range of motion.  Skin:    Coloration: Skin is not jaundiced or pale.     Findings: No bruising, erythema, lesion or rash.  Neurological:     Mental Status: She is alert and oriented to person, place, and time. Mental status is at baseline.  Psychiatric:        Mood and Affect: Mood normal.        Behavior: Behavior normal.        Thought Content: Thought content normal.  Judgment: Judgment normal.     Results for orders placed or performed during the hospital encounter of 10/25/20  SARS CORONAVIRUS 2 (TAT 6-24 HRS) Nasopharyngeal Nasopharyngeal Swab   Specimen: Nasopharyngeal Swab  Result Value Ref Range   SARS Coronavirus 2 NEGATIVE NEGATIVE      Assessment & Plan:   Problem List Items Addressed This Visit   None   Visit Diagnoses    Suspected COVID-19 virus infection    -  Primary   Will get swabbed. Self-quarantine until results are back. Will treat with prednisone, tessalon and tussionex. Continue nebs. Call if not getting better/  if wors   Relevant Orders   Novel Coronavirus, NAA ([facility])       Follow up plan: Return if symptoms worsen or fail to improve.    . This visit was completed via MyChart due to the restrictions of the COVID-19 pandemic. All issues as above were discussed and addressed. Physical exam was done as above through visual confirmation on MyChart. If it was felt that the patient should be evaluated in the office, they were directed there. The patient verbally consented to this visit. . Location of the patient: home . Location of the provider: home . Those involved with this call:  . Provider: Park Liter, DO . CMA: Yvonna Alanis, [place] . Front Desk/Registration: Jill Side  . Time spent on call: 15 minutes with patient face to face via video conference. More than 50% of this time was spent in counseling and coordination of care. 23 minutes total spent in review of patient's record and preparation of their chart.

## 2020-11-08 ENCOUNTER — Ambulatory Visit: Payer: Medicaid Other | Admitting: Licensed Clinical Social Worker

## 2020-11-08 ENCOUNTER — Other Ambulatory Visit: Payer: Self-pay | Admitting: Family Medicine

## 2020-11-08 ENCOUNTER — Telehealth: Payer: Self-pay | Admitting: Licensed Clinical Social Worker

## 2020-11-08 ENCOUNTER — Telehealth: Payer: Self-pay

## 2020-11-08 DIAGNOSIS — U071 COVID-19: Secondary | ICD-10-CM

## 2020-11-08 LAB — SARS-COV-2, NAA 2 DAY TAT

## 2020-11-08 LAB — NOVEL CORONAVIRUS, NAA: SARS-CoV-2, NAA: DETECTED — AB

## 2020-11-08 NOTE — Telephone Encounter (Signed)
Called and LVM asking for patient to please return my call. See result note.  

## 2020-11-08 NOTE — Telephone Encounter (Signed)
Copied from [place] [id number]. Topic: Quick Communication - See Telephone Encounter >> Nov 08, 2020 11:34 AM Loma [place] wrote: CRM for notification. See Telephone encounter for: 11/08/20. Pt states just missed call from [place] re covid, wants to talk to [place] cb at [phone]

## 2020-11-08 NOTE — Progress Notes (Signed)
Please let her know that she did test positive for COVID. She should remain qurantined until 10 days after her symptom onset and I'm going to refer her to the [facility] infusion, but they have a very limited supply. If she qualifies someone will contact her within 24-48 hours. If she does not qualify, she will not be contacted.

## 2020-11-08 NOTE — Chronic Care Management (AMB) (Signed)
  Care Management   Follow Up Note   11/08/2020 Name: Catherine Giles MRN: [id number] DOB: 02-09-1971   Referred by: Guadalupe Maple, MD Reason for referral : No chief complaint on file.   Catherine Giles is a 50 y.o. year old female who is a primary care patient of [name], [name], MD. The care management team was consulted for assistance with care management and care coordination needs.    Review of patient status, including review of consultants reports, relevant laboratory and other test results, and collaboration with appropriate care team members and the patient's provider was performed as part of comprehensive patient evaluation and provision of chronic care management services.    CCM LCSW received return call from patient. Patient is in need of recovering CCM Social Work appointment due to currently having [place]. CCM LCSW rescheduled appointment within 30 days.  Catherine Giles, Catherine Giles, Catherine Giles, Catherine Giles Phone: [phone]

## 2020-11-08 NOTE — Telephone Encounter (Signed)
Chronic Care Management    Clinical Social Work General Follow Up Note  11/08/2020 Name: Rafia Shedden MRN: [id number] DOB: [birth year]  Anajah Sterbenz is a 50 y.o. year old female who is a primary care patient of [name], [name], MD. The CCM team was consulted for assistance with [name] .   Review of patient status, including review of consultants reports, relevant laboratory and other test results, and collaboration with appropriate care team members and the patient's provider was performed as part of comprehensive patient evaluation and provision of chronic care management services.    LCSW completed CCM outreach attempt today but was unable to reach patient successfully. A HIPPA compliant voice message was left encouraging patient to return call once available. LCSW will ask Scheduling Care Guide to reschedule CCM SW appointment with patient as well.  Outpatient Encounter Medications as of 11/08/2020  Medication Sig  . albuterol (VENTOLIN HFA) 108 (90 Base) MCG/ACT inhaler Inhale 2 puffs into the lungs every 4 (four) hours as needed.   Marland Kitchen amLODipine (NORVASC) 5 MG tablet Take 1 tablet (5 mg total) by mouth daily.  . benzonatate (TESSALON) 200 MG capsule Take 1 capsule (200 mg total) by mouth 2 (two) times daily as needed for cough.  . chlorhexidine (PERIDEX) 0.12 % solution 15 mLs 2 (two) times daily.  . chlorpheniramine-HYDROcodone (TUSSIONEX PENNKINETIC ER) 10-8 MG/5ML SUER Take 5 mLs by mouth every 12 (twelve) hours as needed.  . Cholecalciferol 1.25 MG (50000 UT) TABS Take 1 tablet by mouth once a week. For 8 weeks and then stop.  Return to office for lab draw.  . ciprofloxacin (CILOXAN) 0.3 % ophthalmic solution Administer 1 drop, every 2 hours, while awake, for 2 days. Then 1 drop, every 4 hours, while awake, for the next 5 days.  . fluticasone (FLONASE) 50 MCG/ACT nasal spray Place 2 sprays into both nostrils daily.  Marland Kitchen gabapentin (NEURONTIN) 100 MG capsule Take by mouth.   Marland Kitchen ibuprofen (ADVIL) 400 MG tablet Take by mouth.  Marland Kitchen ipratropium-albuterol (DUONEB) 0.5-2.5 (3) MG/3ML SOLN Take 3 mLs by nebulization every 6 (six) hours as needed for up to 7 days.  Marland Kitchen levofloxacin (LEVAQUIN) 500 MG tablet Take 500 mg by mouth daily.  Marland Kitchen levothyroxine (SYNTHROID) 50 MCG tablet Take 50 mcg by mouth daily before breakfast.   . liothyronine (CYTOMEL) 5 MCG tablet Take 5 mcg by mouth daily.  Marland Kitchen losartan (COZAAR) 100 MG tablet Take 100 mg by mouth daily.   . mupirocin cream (BACTROBAN) 2 % Apply 1 application topically 2 (two) times daily. Apply on open sores to buttocks as needed.  . predniSONE (DELTASONE) 10 MG tablet 6 tabs today and tomorrow, 5 tabs the next 2 days, decrease by 1 every other day until gone  . tiZANidine (ZANAFLEX) 4 MG tablet Take 1 tablet (4 mg total) by mouth every 8 (eight) hours as needed for muscle spasms.  . traMADol (ULTRAM) 50 MG tablet Take 50 mg by mouth every 6 (six) hours as needed.   . triamcinolone cream (KENALOG) 0.1 % Apply 1 application topically daily as needed. Apply to affected areas twice daily as needed. Avoid Face,Groin and underarms  . umeclidinium-vilanterol (ANORO ELLIPTA) 62.5-25 MCG/INH AEPB Inhale 1 puff into the lungs daily in the afternoon.   . [DISCONTINUED] traZODone (DESYREL) 50 MG tablet Take 1 tablet (50 mg total) by mouth at bedtime.   No facility-administered encounter medications on file as of 11/08/2020.    Follow Up Plan: [facility] will  reach out to patient to reschedule appointment.   Eula Fried, BSW, MSW, [facility] Care [facility] [email or web address] Phone: [phone]

## 2020-11-09 ENCOUNTER — Telehealth: Payer: Self-pay | Admitting: Unknown Physician Specialty

## 2020-11-09 NOTE — Telephone Encounter (Signed)
I connected by phone with Catherine Giles on 11/09/2020 at 9:16 AM to discuss the potential use of a new treatment for mild to moderate COVID-19 viral infection in non-hospitalized patients.  This patient is a 50 y.o. female that meets the FDA criteria for Emergency Use Authorization of COVID monoclonal antibody sotrovimab.  Has a (+) direct SARS-CoV-2 viral test result  Has mild or moderate COVID-19   Is NOT hospitalized due to COVID-19  Is within 10 days of symptom onset  Has at least one of the high risk factor(s) for progression to severe COVID-19 and/or hospitalization as defined in EUA.  Specific high risk criteria : BMI > 25 and Chronic Lung Disease   I have spoken and communicated the following to the patient or parent/caregiver regarding COVID monoclonal antibody treatment:  1. FDA has authorized the emergency use for the treatment of mild to moderate COVID-19 in adults and pediatric patients with positive results of direct SARS-CoV-2 viral testing who are 79 years of age and older weighing at least 40 kg, and who are at high risk for progressing to severe COVID-19 and/or hospitalization.  2. The significant known and potential risks and benefits of COVID monoclonal antibody, and the extent to which such potential risks and benefits are unknown.  3. Information on available alternative treatments and the risks and benefits of those alternatives, including clinical trials.  4. Patients treated with COVID monoclonal antibody should continue to self-isolate and use infection control measures (e.g., wear mask, isolate, social distance, avoid sharing personal items, clean and disinfect "high touch" surfaces, and frequent handwashing) according to CDC guidelines.   5. The patient or parent/caregiver has the option to accept or refuse COVID monoclonal antibody treatment.  After reviewing this information with the patient, she is unable to come to the infusion center today.  I can't  arrange transportation until tomorrow when she will be out of range  ,Catherine Haddock, NP 11/09/2020 9:16 AM  Sx onset 1/12

## 2020-11-12 ENCOUNTER — Telehealth: Payer: Self-pay

## 2020-11-12 NOTE — Telephone Encounter (Signed)
She tested positive on 1/14 and started symptoms 1/12. Testing on Monday is too soon. She is only out of quarantine 1/22. We do not recommend testing after COVID to confirm its gone as tests can remain positive for weeks after infection. If she is still feeling sick, we can have an appointment with her, but I don't think having her tested would be productive.

## 2020-11-12 NOTE — Telephone Encounter (Signed)
Called pt advised that our office is closed today, pt is wanting to come Monday to be retested to make sure she is cleared from covid. Pt was seen 1/14. Please advise.  Copied from [place] [id number]. Topic: General - Inquiry >> Nov 12, 2020  8:21 AM Scherrie Gerlach wrote: Reason for CRM: pt wants to know if she can get a covid test at the office.  Pt states she wants to be "cleared" from covid. She would like your office to test her today. She wants to make sure the symptoms she is having are her medical conditions and not covid.

## 2020-11-12 NOTE — Telephone Encounter (Signed)
Called and notified patient of Dr. Durenda Age message. She verbalized understanding.

## 2020-11-22 ENCOUNTER — Ambulatory Visit: Payer: Self-pay | Admitting: *Deleted

## 2020-11-22 NOTE — Telephone Encounter (Signed)
Called to schedule no answer vm full  °

## 2020-11-22 NOTE — Telephone Encounter (Signed)
Pt called back scheduled virtual tomorrow with Henrine Screws

## 2020-11-22 NOTE — Telephone Encounter (Addendum)
Pt called in c/o coughing, chills, headache, body aches "I feel so bad".    "I had covid 2 weeks ago but I think it has come back"   "The coughing is awful".   Symptoms started 11/03/2020 with initial covid infection.   Did a video visit with Dr. Park Liter on 11/05/2020.   (She is talking in short phrases).   She has COPD and has been doing her nebulizer every 4 hrs but it's not helping.  "I don't know what to do".  She has Lupus, Rheumatoid, COPD, diabetes, fibromyalgia.    I instructed her to call 911 now.   She did not want to call however she finally agreed after I discussed with her the high risk factors she had.   "I will call them but I need to get myself situated first".  I again emphasized the importance of calling 911.  She was very short of breath and she had just completed a nebulizer treatment.  She was crying also as we were talking.   I'm not sure if she will call 911.   I sent my notes high priority to [facility].  I called her back at 1:24 PM and checked on her.   She had not called 911 however she said her daughter is coming to take her to an urgent care.   "Every time I go to the emergency room they never treat me right away".   "I sit there for a long time with a lot of other very sick people".    "I'm not going there".   "I will go to the urgent care though".  I let her know I was glad she is getting medical help because I'm very concerned about her.    She thanked me for calling and checking up on her.   "I am going to the urgent care".      Reason for Disposition . SEVERE difficulty breathing (e.g., struggling for each breath, speaks in single words)    covid positive 2 wks ago.   Has Lupus, COPD, diabetes and fibromyalgia  Answer Assessment - Initial Assessment Questions 1. COVID-19 ONSET: "When did the symptoms of COVID-19 first start?"     I have trouble with everything.   I have headache, chills, coughing, shortness of breath.   I did a nebulizer.     I have COPD.  2. DIAGNOSIS CONFIRMATION: "How were you diagnosed?" (e.g., COVID-19 oral or nasal viral test; COVID-19 antibody test; doctor visit)     I was positive 2 wks ago.   But I have it again. 3. MAIN SYMPTOM:  "What is your main concern or symptom right now?" (e.g., breathing difficulty, cough, fatigue. loss of smell)     I just finished doing a nebulizer treatment. 4. SYMPTOM ONSET: "When did the  Shortness of breath  start?"     I'm doing the nebulizer.   I have Lupus, COPD, Fibromyalgia, diabetic.  The coughing is the worse. 5. BETTER-SAME-WORSE: "Are you getting better, staying the same, or getting worse over the last 1 to 2 weeks?"     Worse.   6. RECENT MEDICAL VISIT: "Have you been seen by a healthcare provider (doctor, NP, PA) for these persisting COVID-19 symptoms?" If Yes, ask: "When were you seen?" (e.g., date)     Yes 7. COUGH: "Do you have a cough?" If Yes, ask: "How bad is the cough?"       Very bad cough 8. FEVER: "Do  you have a fever?" If Yes, ask: "What is your temperature, how was it measured, and when did it start?"     Not sure 9. BREATHING DIFFICULTY: "Are you having any trouble breathing?" If Yes, ask: "How bad is your breathing?" (e.g., mild, moderate, severe)    - MILD: No SOB at rest, mild SOB with walking, speaks normally in sentences, can lie down, no retractions, pulse < 100.    - MODERATE: SOB at rest, SOB with minimal exertion and prefers to sit, cannot lie down flat, speaks in phrases, mild retractions, audible wheezing, pulse 100-120.    - SEVERE: Very SOB at rest, speaks in single words, struggling to breathe, sitting hunched forward, retractions, pulse > 120       Very short of breath she is talking in short phrases. 10. HIGH RISK DISEASE: "Do you have any chronic medical problems?" (e.g., asthma, heart or lung disease, weak immune system, obesity, etc.)       Lupus, COPD, fibromyalgia, diabetes 11. VACCINE: "Have you gotten the COVID-19 vaccine?" If  Yes ask: "Which one, how many shots, when did you get it?"       Not asked  12. PREGNANCY: "Is there any chance you are pregnant?" "When was your last menstrual period?"       Not asked 13. OTHER SYMPTOMS: "Do you have any other symptoms?"  (e.g., fatigue, headache, muscle pain, weakness)       Headache, chills, coughing fatigue  Due to her symptoms and very high risk factor I instructed her to call 911,  Protocols used: CORONAVIRUS (COVID-19) PERSISTING SYMPTOMS FOLLOW-UP CALL-A-AH

## 2020-11-22 NOTE — Telephone Encounter (Signed)
Needs appointment today or tomorrow or to go to ER.

## 2020-11-23 ENCOUNTER — Telehealth: Payer: Medicare Other | Admitting: Nurse Practitioner

## 2020-11-24 ENCOUNTER — Telehealth: Payer: Self-pay

## 2020-11-24 NOTE — Telephone Encounter (Signed)
Needs appointment

## 2020-11-24 NOTE — Telephone Encounter (Signed)
Scheduled

## 2020-11-24 NOTE — Telephone Encounter (Signed)
Pt was seen virtually 1/14 now showed appt scheduled for yesterday. Please advise  Copied from [place] [id number]. Topic: Appointment Scheduling - Scheduling Inquiry for Clinic >> Nov 24, 2020  1:17 PM Pawlus, Brayton Layman A wrote: Reason for CRM: PT called in stating she tested positive for COVID, has gotten through her 5 day quarantine period but is still feeling very sick. PT states her employer is wanting her to come back to work and she does not feel ready yet. PT is requesting another COVID test, please advise.

## 2020-11-25 ENCOUNTER — Other Ambulatory Visit: Payer: Self-pay

## 2020-11-25 ENCOUNTER — Ambulatory Visit
Admission: RE | Admit: 2020-11-25 | Discharge: 2020-11-25 | Disposition: A | Discharge status: Home / Self Care | Payer: Medicare Other | Source: Ambulatory Visit | Attending: Family Medicine | Admitting: Family Medicine

## 2020-11-25 ENCOUNTER — Telehealth (INDEPENDENT_AMBULATORY_CARE_PROVIDER_SITE_OTHER): Payer: Medicare Other | Admitting: Family Medicine

## 2020-11-25 ENCOUNTER — Ambulatory Visit
Admission: RE | Admit: 2020-11-25 | Discharge: 2020-11-25 | Disposition: A | Discharge status: Home / Self Care | Payer: Medicare Other | Attending: Family Medicine | Admitting: Family Medicine

## 2020-11-25 ENCOUNTER — Encounter: Payer: Self-pay | Admitting: Family Medicine

## 2020-11-25 VITALS — BP 120/80

## 2020-11-25 DIAGNOSIS — R059 Cough, unspecified: Secondary | ICD-10-CM

## 2020-11-25 MED ORDER — DOXYCYCLINE HYCLATE 100 MG PO TABS: 100.0000 mg | ORAL_TABLET | Freq: Two times a day (BID) | ORAL | 0 refills | Status: DC

## 2020-11-25 MED ORDER — BENZONATATE 200 MG PO CAPS: 200.0000 mg | ORAL_CAPSULE | Freq: Two times a day (BID) | ORAL | 0 refills | Status: DC | PRN

## 2020-11-25 MED ORDER — PREDNISONE 10 MG PO TABS: ORAL_TABLET | ORAL | 0 refills | Status: DC

## 2020-11-25 MED ORDER — HYDROCOD POLST-CPM POLST ER 10-8 MG/5ML PO SUER: 5.0000 mL | Freq: Two times a day (BID) | ORAL | 0 refills | Status: DC | PRN

## 2020-11-25 NOTE — Progress Notes (Signed)
BP 120/80    Subjective:    Patient ID: Catherine Giles, female    DOB: 09-15-1971, 50 y.o.   MRN: [id number]  HPI: Catherine Giles is a 50 y.o. female  Chief Complaint  Patient presents with  . Cough    Patient states she is coughing excessively, shortness of breath, coughing up clear mucus. Patient was positive with covid last month and states she has not gotten better since then. Would like to get retested again for covid.    UPPER RESPIRATORY TRACT INFECTION Duration: 4 weeks- had COVID 2 weeks ago Worst symptom: cough, SOB Fever: no, chills Cough: yes Shortness of breath: yes Wheezing: yes Chest pain: yes, with cough Chest tightness: yes Chest congestion: yes Nasal congestion: yes Runny nose: yes Post nasal drip: yes Sneezing: yes Sore throat: no Swollen glands: no Sinus pressure: yes Headache: yes Face pain: no Toothache: no Ear pain: no  Ear pressure: no  Eyes red/itching:no Eye drainage/crusting: no  Vomiting: no Rash: no Fatigue: yes Sick contacts: no Strep contacts: no  Context: worse Recurrent sinusitis: no Relief with OTC cold/cough medications: no  Treatments attempted: none   Relevant past medical, surgical, family and social history reviewed and updated as indicated. Interim medical history since our last visit reviewed. Allergies and medications reviewed and updated.  Review of Systems  Constitutional: Positive for chills and diaphoresis. Negative for activity change, appetite change, fatigue, fever and unexpected weight change.  HENT: Positive for congestion, postnasal drip, rhinorrhea and sinus pain. Negative for dental problem, drooling, ear discharge, ear pain, facial swelling, hearing loss, mouth sores, nosebleeds, sinus pressure, sneezing, sore throat, tinnitus, trouble swallowing and voice change.   Respiratory: Positive for cough, chest tightness, shortness of breath and wheezing. Negative for apnea, choking and stridor.   Cardiovascular:  Negative.  Negative for chest pain, palpitations and leg swelling.  Gastrointestinal: Negative.   Psychiatric/Behavioral: Negative.     Per HPI unless specifically indicated above     Objective:    BP 120/80   Wt Readings from Last 3 Encounters:  10/25/20 170 lb (77.1 kg)  10/15/20 174 lb (78.9 kg)  10/12/20 174 lb (78.9 kg)    Physical Exam Vitals and nursing note reviewed.  Constitutional:      General: She is not in acute distress.    Appearance: Normal appearance. She is not ill-appearing, toxic-appearing or diaphoretic.  HENT:     Head: Normocephalic and atraumatic.     Right Ear: External ear normal.     Left Ear: External ear normal.     Nose: Nose normal.     Mouth/Throat:     Mouth: Mucous membranes are moist.     Pharynx: Oropharynx is clear.  Eyes:     General: No scleral icterus.       Right eye: No discharge.        Left eye: No discharge.     Conjunctiva/sclera: Conjunctivae normal.     Pupils: Pupils are equal, round, and reactive to light.  Pulmonary:     Effort: Pulmonary effort is normal. No respiratory distress.     Comments: Speaking in full sentences Musculoskeletal:        General: Normal range of motion.     Cervical back: Normal range of motion.  Skin:    Coloration: Skin is not jaundiced or pale.     Findings: No bruising, erythema, lesion or rash.  Neurological:     Mental Status: She is alert and oriented to  person, place, and time. Mental status is at baseline.  Psychiatric:        Mood and Affect: Mood normal.        Behavior: Behavior normal.        Thought Content: Thought content normal.        Judgment: Judgment normal.     Results for orders placed or performed in visit on 11/05/20  Novel Coronavirus, NAA ([facility])   Specimen: Saline  Result Value Ref Range   SARS-CoV-2, NAA Detected (A) Not Detected  SARS-COV-2, NAA 2 DAY TAT  Result Value Ref Range   SARS-CoV-2, NAA 2 DAY TAT Performed       Assessment & Plan:    Problem List Items Addressed This Visit   None   Visit Diagnoses    Cough    -  Primary   Suspect COVID pneumonia. Will treat with prednisone, tessalon, tussionex and doxy- obtaining CXR now. Keep appt for 5 days from now for in person eval.    Relevant Orders   DG Chest 2 View       Follow up plan: Return As scheduled for 2/8.   Marland Kitchen This visit was completed via MyChart due to the restrictions of the COVID-19 pandemic. All issues as above were discussed and addressed. Physical exam was done as above through visual confirmation on MyChart. If it was felt that the patient should be evaluated in the office, they were directed there. The patient verbally consented to this visit. . Location of the patient: parking lot . Location of the provider: work . Those involved with this call:  . Provider: Park Liter, DO . CMA: Louanna Raw, [place] . Front Desk/Registration: Jill Side  . Time spent on call: 15 minutes with patient face to face via video conference. More than 50% of this time was spent in counseling and coordination of care. 23 minutes total spent in review of patient's record and preparation of their chart.

## 2020-11-26 ENCOUNTER — Emergency Department
Admission: EM | Admit: 2020-11-26 | Discharge: 2020-11-26 | Disposition: A | Discharge status: Home / Self Care | Payer: Medicare Other | Attending: Emergency Medicine | Admitting: Emergency Medicine

## 2020-11-26 DIAGNOSIS — Z5321 Procedure and treatment not carried out due to patient leaving prior to being seen by health care provider: Secondary | ICD-10-CM | POA: Insufficient documentation

## 2020-11-26 DIAGNOSIS — R0602 Shortness of breath: Secondary | ICD-10-CM | POA: Insufficient documentation

## 2020-11-26 NOTE — ED Notes (Signed)
This RN obtained pts vital signs while pt was in the lobby waiting to be seen. Pt states that since her O2 sats are normal that she is going to go home. Pt states that she was given Prednisone and Antibiotics today but she has not been able to start them yet. Pt states that she is going to start on her medication and give it 24 hours, if she is not better she will come in to be seen. RN informed pt we are more than happy to see her but we cannot make her stay. Pt states that she will go home and try medication and come back if not getting better. Pt was encouraged if her symptoms get worse to come back to ED.

## 2020-11-26 NOTE — ED Notes (Signed)
First Nurse Note: Pt to ED from [facility] for Shortness of breath.

## 2020-11-29 DIAGNOSIS — R059 Cough, unspecified: Secondary | ICD-10-CM | POA: Diagnosis not present

## 2020-11-29 DIAGNOSIS — R0600 Dyspnea, unspecified: Secondary | ICD-10-CM | POA: Diagnosis not present

## 2020-11-29 DIAGNOSIS — J439 Emphysema, unspecified: Secondary | ICD-10-CM | POA: Diagnosis not present

## 2020-11-30 ENCOUNTER — Ambulatory Visit: Payer: Self-pay | Admitting: Nurse Practitioner

## 2020-12-09 ENCOUNTER — Telehealth: Payer: Medicaid Other

## 2020-12-17 ENCOUNTER — Telehealth: Payer: Medicaid Other

## 2020-12-17 ENCOUNTER — Telehealth: Payer: Self-pay | Admitting: General Practice

## 2020-12-17 NOTE — Telephone Encounter (Signed)
  Chronic Care Management   Outreach Note  12/17/2020 Name: Catherine Giles MRN: [id number] DOB: [birth year]  Referred by: Charlynne Cousins, MD Reason for referral : Appointment (RNCM: Follow up for Chronic Disease Management and Care Coordination Needs )   An unsuccessful telephone outreach was attempted today. The patient was referred to the case management team for assistance with care management and care coordination.   Follow Up Plan: A HIPAA compliant phone message was left for the patient providing contact information and requesting a return call.   Noreene Larsson RN, MSN, [facility] Mobile: [phone]

## 2020-12-21 ENCOUNTER — Ambulatory Visit: Payer: Medicaid Other | Admitting: Allergy and Immunology

## 2020-12-29 DIAGNOSIS — R062 Wheezing: Secondary | ICD-10-CM | POA: Diagnosis not present

## 2020-12-29 DIAGNOSIS — J441 Chronic obstructive pulmonary disease with (acute) exacerbation: Secondary | ICD-10-CM | POA: Diagnosis not present

## 2020-12-29 DIAGNOSIS — J45901 Unspecified asthma with (acute) exacerbation: Secondary | ICD-10-CM | POA: Diagnosis not present

## 2021-01-14 ENCOUNTER — Ambulatory Visit (INDEPENDENT_AMBULATORY_CARE_PROVIDER_SITE_OTHER): Payer: Medicare Other | Admitting: Licensed Clinical Social Worker

## 2021-01-14 DIAGNOSIS — J440 Chronic obstructive pulmonary disease with acute lower respiratory infection: Secondary | ICD-10-CM

## 2021-01-14 DIAGNOSIS — R413 Other amnesia: Secondary | ICD-10-CM

## 2021-01-14 DIAGNOSIS — J209 Acute bronchitis, unspecified: Secondary | ICD-10-CM | POA: Diagnosis not present

## 2021-01-14 DIAGNOSIS — I10 Essential (primary) hypertension: Secondary | ICD-10-CM | POA: Diagnosis not present

## 2021-01-14 DIAGNOSIS — M0579 Rheumatoid arthritis with rheumatoid factor of multiple sites without organ or systems involvement: Secondary | ICD-10-CM

## 2021-01-14 DIAGNOSIS — M797 Fibromyalgia: Secondary | ICD-10-CM

## 2021-01-14 NOTE — Chronic Care Management (AMB) (Signed)
Chronic Care Management    Clinical Social Work Note  01/14/2021 Name: Catherine Giles MRN: [id number] DOB: 10/29/1970  Catherine Giles is a 50 y.o. year old female who is a primary care patient of Vigg, Avanti, MD. The CCM team was consulted to assist the patient with chronic disease management and/or care coordination needs related to: Level of Care Concerns and Mental Health Counseling and Resources.   Engaged with patient by telephone for follow up visit in response to provider referral for social work chronic care management and care coordination services.   Consent to Services:  The patient was given the following information about Chronic Care Management services today, agreed to services, and gave verbal consent: 1. CCM service includes personalized support from designated clinical staff supervised by the primary care provider, including individualized plan of care and coordination with other care providers 2. 24/7 contact phone numbers for assistance for urgent and routine care needs. 3. Service will only be billed when office clinical staff spend 20 minutes or more in a month to coordinate care. 4. Only one practitioner may furnish and bill the service in a calendar month. 5.The patient may stop CCM services at any time (effective at the end of the month) by phone call to the office staff. 6. The patient will be responsible for cost sharing (co-pay) of up to 20% of the service fee (after annual deductible is met). Patient agreed to services and consent obtained.  Patient agreed to services and consent obtained.   Assessment: Review of patient past medical history, allergies, medications, and health status, including review of relevant consultants reports was performed today as part of a comprehensive evaluation and provision of chronic care management and care coordination services.     SDOH (Social Determinants of Health) assessments and interventions performed:    Advanced Directives  Status: See Care Plan for related entries.  CCM Care Plan  Allergies  Allergen Reactions  . Azithromycin Anaphylaxis  . Penicillins     Did it involve swelling of the face/tongue/throat, SOB, or low BP? Yes Did it involve sudden or severe rash/hives, skin peeling, or any reaction on the inside of your mouth or nose? No Did you need to seek medical attention at a hospital or doctor's office? No When did it last happen? If all above answers are "NO", may proceed with cephalosporin use.   . Shellfish Allergy     Outpatient Encounter Medications as of 01/14/2021  Medication Sig  . albuterol (VENTOLIN HFA) 108 (90 Base) MCG/ACT inhaler Inhale 2 puffs into the lungs every 4 (four) hours as needed.   Marland Kitchen amLODipine (NORVASC) 5 MG tablet Take 1 tablet (5 mg total) by mouth daily.  . benzonatate (TESSALON) 200 MG capsule Take 1 capsule (200 mg total) by mouth 2 (two) times daily as needed for cough.  . chlorhexidine (PERIDEX) 0.12 % solution 15 mLs 2 (two) times daily.  . chlorpheniramine-HYDROcodone (TUSSIONEX PENNKINETIC ER) 10-8 MG/5ML SUER Take 5 mLs by mouth every 12 (twelve) hours as needed.  . Cholecalciferol 1.25 MG (50000 UT) TABS Take 1 tablet by mouth once a week. For 8 weeks and then stop.  Return to office for lab draw.  . ciprofloxacin (CILOXAN) 0.3 % ophthalmic solution Administer 1 drop, every 2 hours, while awake, for 2 days. Then 1 drop, every 4 hours, while awake, for the next 5 days. (Patient not taking: Reported on 11/25/2020)  . doxycycline (VIBRA-TABS) 100 MG tablet Take 1 tablet (100 mg total) by mouth 2 (  two) times daily.  . fluticasone (FLONASE) 50 MCG/ACT nasal spray Place 2 sprays into both nostrils daily.  Marland Kitchen gabapentin (NEURONTIN) 100 MG capsule Take by mouth.  Marland Kitchen ibuprofen (ADVIL) 400 MG tablet Take by mouth.  Marland Kitchen ipratropium-albuterol (DUONEB) 0.5-2.5 (3) MG/3ML SOLN Take 3 mLs by nebulization every 6 (six) hours as needed for up to 7 days.  Marland Kitchen levofloxacin  (LEVAQUIN) 500 MG tablet Take 500 mg by mouth daily.  Marland Kitchen levothyroxine (SYNTHROID) 50 MCG tablet Take 50 mcg by mouth daily before breakfast.   . liothyronine (CYTOMEL) 5 MCG tablet Take 5 mcg by mouth daily.  Marland Kitchen losartan (COZAAR) 100 MG tablet Take 100 mg by mouth daily.   . mupirocin cream (BACTROBAN) 2 % Apply 1 application topically 2 (two) times daily. Apply on open sores to buttocks as needed.  . predniSONE (DELTASONE) 10 MG tablet 6 tabs today and tomorrow, 5 tabs the next 2 days, decrease by 1 every other day until gone  . tiZANidine (ZANAFLEX) 4 MG tablet Take 1 tablet (4 mg total) by mouth every 8 (eight) hours as needed for muscle spasms.  . traMADol (ULTRAM) 50 MG tablet Take 50 mg by mouth every 6 (six) hours as needed.   . triamcinolone cream (KENALOG) 0.1 % Apply 1 application topically daily as needed. Apply to affected areas twice daily as needed. Avoid Face,Groin and underarms  . umeclidinium-vilanterol (ANORO ELLIPTA) 62.5-25 MCG/INH AEPB Inhale 1 puff into the lungs daily in the afternoon.   . [DISCONTINUED] traZODone (DESYREL) 50 MG tablet Take 1 tablet (50 mg total) by mouth at bedtime.   No facility-administered encounter medications on file as of 01/14/2021.    Patient Active Problem List   Diagnosis Date Noted  . Memory changes 10/12/2020  . Dairy allergy 10/12/2020  . Headache 10/12/2020  . Vaginal stenosis 03/19/2020  . Obesity 03/19/2020  . Insomnia 01/05/2020  . Complex regional pain syndrome I of upper limb 12/17/2019  . Essential hypertension 12/13/2019  . Centrilobular emphysema ([place]) 12/13/2019  . Elevated hemoglobin A1c 12/13/2019  . Hypothyroid 12/13/2019  . DDD (degenerative disc disease), lumbar 08/26/2019  . Multinodular goiter 08/14/2019  . Chronic left hip pain 05/05/2019  . Osteoarthritis of spine with radiculopathy, cervical region 05/05/2019  . Encounter for long-term (current) use of high-risk medication 02/14/2019  . Fibromyalgia 02/10/2019   . Rheumatoid arthritis involving multiple sites with positive rheumatoid factor ([place]) 02/10/2019  . Undifferentiated connective tissue disease ([place]) 02/10/2019  . Cervical radiculopathy 03/13/2018  . Vitamin D deficiency 10/11/2015    Conditions to be addressed/monitored: Depression and Dementia; Level of care concerns, ADL IADL limitations, Mental Health Concerns  and Social Isolation  Care Plan : General Social Work (Adult)  Updates made by Greg Cutter, LCSW since 01/14/2021 12:00 AM    Problem: Health Promotion or Disease Self-Management (General Plan of Care)     Long-Range Goal: Self-Management Plan Developed   Start Date: 01/14/2021  Priority: Medium  Note:   Timeframe:  Long-Range Goal Priority:  Medium Start Date:    01/14/21                         Expected End Date:   04/16/21                    Follow Up Date -02/28/21   - avoid negative self-talk - develop a personal safety plan - develop a plan to deal with triggers like holidays,  anniversaries - exercise at least 2 to 3 times per week - have a plan for how to handle bad days - journal feelings and what helps to feel better or worse - spend time or talk with others at least 2 to 3 times per week - spend time or talk with others every day - watch for early signs of feeling worse - write in journal every day    Why is this important?    Keeping track of your progress will help your treatment team find the right mix of medicine and therapy for you.   Write in your journal every day.   Day-to-day changes in depression symptoms are normal. It may be more helpful to check your progress at the end of each week instead of every day.    CARE PLAN ENTRY (see longitudinal plan of care for additional care plan information)  Current Barriers:  . Patient with Depression and Dementia in need of assistance with connection to community resources  . Knowledge deficits and need for support, education and care coordination  related to community resources support  . Level of care concerns, ADL IADL limitations, Mental Health Concerns , Social Isolation, and Limited access to caregiver  Clinical Goal(s)  . Over the next 120 days patient will be able to keep up with medical appointments, take medications as prescribed and implement appropriate and healthy self-care into her daily routine as instructed by CCM LCSW  Interventions provided by LCSW:  . Assessed patient's care coordination needs related to care coordination, managing health care and discussed ongoing care management follow up  . Provided patient with information about healthy self-care . Advised patient to contact [facility] to schedule PCP appointment as she is struggling with chronic pain and memory loss and could use a referral to a neurologist  . Collaborated with appropriate clinical care team members regarding patient needs . Discussed plans with patient for ongoing care management follow up and provided patient with direct contact information for care management team . Assisted patient/caregiver with obtaining information about health plan benefits . Provided education and assistance to client regarding Advanced Directives. . Provided education to patient/caregiver regarding level of care options. . Provided education to patient/caregiver about Hospice and/or Palliative Care services . Motivational Interviewing . Solution-Focused Strategies . Mindfulness or Relaxation Training . Brief CBT . Emotional/Supportive Counseling interventions implemented   . Patient admits that she has missed several scheduled medical appointments and will sit down with sister today to re-schedule appointments. Daughter can provide stable transportation for patient to these and can attend these appointments with her as well.  Marland Kitchen LCSW discussed coping skills for anxiety. SW used empathetic and active and reflective listening, validated patient's feelings/concerns, and provided  emotional support. LCSW provided self-care education to help manage her multiple health conditions and improve her mood. Development of long term plan of care and institution of self health management strategies completed. LCSW provided education on relaxation techniques such as meditation, deep breathing, massage, grounding exercsies or yoga that can activate the body's relaxation response and ease symptoms of stress and anxiety.  . Patient was informed that current CCM LCSW will be leaving position next month and her next CCM Social Work follow up visit will be with another LCSW. Patient was appreciative of support provided and receptive to news . Patient's SOB has increased. She reports that her SOB is constant at this point.   Patient Self Care Activities & Deficits:  . Patient is unable to independently navigate  and manage her health care alone . Acknowledges deficits and is motivated to resolve concern  . Patient is able to contact medical providers to schedule appointments  as discussed today     Follow Up Plan: SW will follow up with patient by phone over the next quarter      Eula Fried, BSW, MSW, [email or web address] Phone: [phone]

## 2021-01-14 NOTE — Patient Instructions (Signed)
Licensed Clinical Social Worker Visit Information  Goals we discussed today:  Goals Addressed            This Visit's Progress   . Track and Manage My Symptoms-Depression       Timeframe:  Long-Range Goal Priority:  Medium Start Date:    01/14/21                         Expected End Date:   04/16/21                    Follow Up Date -02/28/21   - avoid negative self-talk - develop a personal safety plan - develop a plan to deal with triggers like holidays, anniversaries - exercise at least 2 to 3 times per week - have a plan for how to handle bad days - journal feelings and what helps to feel better or worse - spend time or talk with others at least 2 to 3 times per week - spend time or talk with others every day - watch for early signs of feeling worse - write in journal every day    Why is this important?    Keeping track of your progress will help your treatment team find the right mix of medicine and therapy for you.   Write in your journal every day.   Day-to-day changes in depression symptoms are normal. It may be more helpful to check your progress at the end of each week instead of every day.    CARE PLAN ENTRY (see longitudinal plan of care for additional care plan information)  Current Barriers:  . Patient with Depression and Dementia in need of assistance with connection to community resources  . Knowledge deficits and need for support, education and care coordination related to community resources support  . Level of care concerns, ADL IADL limitations, Mental Health Concerns , Social Isolation, and Limited access to caregiver  Clinical Goal(s)  . Over the next 120 days patient will be able to keep up with medical appointments, take medications as prescribed and implement appropriate and healthy self-care into her daily routine as instructed by CCM LCSW  Interventions provided by LCSW:  . Assessed patient's care coordination needs related to care coordination,  managing health care and discussed ongoing care management follow up  . Provided patient with information about healthy self-care . Advised patient to contact [facility] to schedule PCP appointment as she is struggling with chronic pain and memory loss and could use a referral to a neurologist  . Collaborated with appropriate clinical care team members regarding patient needs . Discussed plans with patient for ongoing care management follow up and provided patient with direct contact information for care management team . Assisted patient/caregiver with obtaining information about health plan benefits . Provided education and assistance to client regarding Advanced Directives. . Provided education to patient/caregiver regarding level of care options. . Provided education to patient/caregiver about Hospice and/or Palliative Care services . Motivational Interviewing . Solution-Focused Strategies . Mindfulness or Relaxation Training . Brief CBT . Emotional/Supportive Counseling interventions implemented   . Patient admits that she has missed several scheduled medical appointments and will sit down with sister today to re-schedule appointments. Daughter can provide stable transportation for patient to these and can attend these appointments with her as well.  Marland Kitchen LCSW discussed coping skills for anxiety. SW used empathetic and active and reflective listening, validated patient's feelings/concerns, and provided emotional support. LCSW  provided self-care education to help manage her multiple health conditions and improve her mood. Development of long term plan of care and institution of self health management strategies completed. LCSW provided education on relaxation techniques such as meditation, deep breathing, massage, grounding exercsies or yoga that can activate the body's relaxation response and ease symptoms of stress and anxiety.  . Patient was informed that current CCM LCSW will be leaving position next  month and her next CCM Social Work follow up visit will be with another LCSW. Patient was appreciative of support provided and receptive to news . Patient's SOB has increased. She reports that her SOB is constant at this point.   Patient Self Care Activities & Deficits:  . Patient is unable to independently navigate and manage her health care alone . Acknowledges deficits and is motivated to resolve concern  . Patient is able to contact medical providers to schedule appointments  as discussed today           Eula Fried, [place], MSW, [email or web address] Phone: [phone]

## 2021-01-21 ENCOUNTER — Other Ambulatory Visit: Payer: Self-pay | Admitting: Nurse Practitioner

## 2021-01-21 NOTE — Progress Notes (Signed)
Updating med list.

## 2021-01-22 ENCOUNTER — Other Ambulatory Visit: Payer: Self-pay | Admitting: Dermatology

## 2021-01-22 DIAGNOSIS — L810 Postinflammatory hyperpigmentation: Secondary | ICD-10-CM

## 2021-01-24 ENCOUNTER — Encounter: Payer: Self-pay | Admitting: Nurse Practitioner

## 2021-01-24 ENCOUNTER — Ambulatory Visit (INDEPENDENT_AMBULATORY_CARE_PROVIDER_SITE_OTHER): Payer: Medicare Other | Admitting: Nurse Practitioner

## 2021-01-24 ENCOUNTER — Other Ambulatory Visit: Payer: Self-pay

## 2021-01-24 VITALS — BP 135/85 | HR 88 | Temp 98.2°F | Wt 174.6 lb

## 2021-01-24 DIAGNOSIS — E559 Vitamin D deficiency, unspecified: Secondary | ICD-10-CM

## 2021-01-24 DIAGNOSIS — M797 Fibromyalgia: Secondary | ICD-10-CM | POA: Diagnosis not present

## 2021-01-24 DIAGNOSIS — R7309 Other abnormal glucose: Secondary | ICD-10-CM

## 2021-01-24 DIAGNOSIS — F320 Major depressive disorder, single episode, mild: Secondary | ICD-10-CM | POA: Diagnosis not present

## 2021-01-24 DIAGNOSIS — Z79899 Other long term (current) drug therapy: Secondary | ICD-10-CM | POA: Diagnosis not present

## 2021-01-24 DIAGNOSIS — J432 Centrilobular emphysema: Secondary | ICD-10-CM | POA: Diagnosis not present

## 2021-01-24 DIAGNOSIS — E039 Hypothyroidism, unspecified: Secondary | ICD-10-CM | POA: Diagnosis not present

## 2021-01-24 DIAGNOSIS — R413 Other amnesia: Secondary | ICD-10-CM

## 2021-01-24 DIAGNOSIS — E6609 Other obesity due to excess calories: Secondary | ICD-10-CM

## 2021-01-24 DIAGNOSIS — Z6830 Body mass index (BMI) 30.0-30.9, adult: Secondary | ICD-10-CM

## 2021-01-24 DIAGNOSIS — M0579 Rheumatoid arthritis with rheumatoid factor of multiple sites without organ or systems involvement: Secondary | ICD-10-CM | POA: Diagnosis not present

## 2021-01-24 MED ORDER — NICOTINE 21 MG/24HR TD PT24: 21.0000 mg | MEDICATED_PATCH | Freq: Every day | TRANSDERMAL | 0 refills | Status: DC

## 2021-01-24 MED ORDER — DULOXETINE HCL 30 MG PO CPEP: ORAL_CAPSULE | ORAL | 4 refills | Status: DC

## 2021-01-24 NOTE — Assessment & Plan Note (Addendum)
Chronic, check Vit D level today and continue supplement.  Recommend she take this consistently.

## 2021-01-24 NOTE — Assessment & Plan Note (Signed)
Chronic, ongoing.  Followed by rheumatology, she would like second opinion on her RA and pain.  Will place a referral to [facility] per patient request.  At this time continue current medications and adjust as needed.  CMP, Vit D, and B12 today.

## 2021-01-24 NOTE — Assessment & Plan Note (Signed)
Noted worsening over past months, suspect multifactorial with increased stressors leading to worsening symptoms.  6CIT 8 last visit and PHQ9 16 today.  Have recommend she follow-up with neurology as soon as possible due to recent changes.  Will obtain labs today to further assess, to include CBC, TSH, A1c, and B12 level.

## 2021-01-24 NOTE — Assessment & Plan Note (Signed)
Chronic, ongoing.  Continue current inhaler regimen and adjust as needed.  Spirometry next visit.  Would benefit from Lung CA screening, will discuss next visit.

## 2021-01-24 NOTE — Assessment & Plan Note (Signed)
Chronic, ongoing, followed by rheumatology.  Would like second opinion at [facility] on pain.  At this time continue Gabapentin as ordered by specialist and discussed at length with her adding on Duloxetine, which would benefit current mood and fibromyalgia pain.  She would like to trial this.  Script sent.  Return in 4 weeks to meet new PCP.

## 2021-01-24 NOTE — Patient Instructions (Signed)

## 2021-01-24 NOTE — Assessment & Plan Note (Signed)
Chronic, ongoing, followed by Dr. Honor Junes and last seen 08/05/20.  Will recheck TSH and Free T4 today due to mood changes and fatigue.  At this time continue current medication regimen and adjust as needed.  Return in 4 weeks.

## 2021-01-24 NOTE — Progress Notes (Signed)
BP 135/85   Pulse 88 Comment: apical  Temp 98.2 F (36.8 C) (Oral)   Wt 174 lb 9.6 oz (79.2 kg)   SpO2 94%   BMI 30.93 kg/m    Subjective:    Patient ID: Catherine Giles, female    DOB: 01-10-1971, 50 y.o.   MRN: [id number]  HPI: Catherine Giles is a 50 y.o. female  Chief Complaint  Patient presents with  . Personal Medical Matter    Patient states she is having issues with everything and not feeling like herself and not feeling well. Patient states she is really really dark right now and has been sleeping a lot lately. Patient states she is having a lot of memory. Patient states it is getting worse. Patient would like to discuss colonoscopy   DEPRESSION Reports she is in a dark place right now, has not been sleeping as well and sometimes sleeping too much.  Sees neurology for arm issues, last saw 09/20/2020.  She is also followed by rheumatology for RA and fibromyalgia, last seen 10/21/20.  Last TSH at [facility] was in October with level 1.402 and A1c 6.3% in December.  Vitamin D level in December was 8.7 -- missed some of her weekly doses.  Her memory continues to be a concern, but has not returned to neurology as recommended last visit.  Continues to smoke daily, has underlying COPD.  She reports not understanding why she is in this spot, really tired and no energy.  Continues on her Levothyroxine every day for thyroid.  She would like secondary opinion on her pain management, as is frustrated with rheumatology at this time.   Mood status: uncontrolled Satisfied with current treatment?: yes Symptom severity: moderate  Duration of current treatment : chronic Side effects: no Medication compliance: good compliance Psychotherapy/counseling: many years ago Depressed mood: yes Anxious mood: no Anhedonia: yes Significant weight loss or gain: no Insomnia: yes hard to stay asleep Fatigue: yes Feelings of worthlessness or guilt: no Impaired concentration/indecisiveness: yes Suicidal  ideations: no Hopelessness: no Crying spells: yes Depression screen [facility] 2/9 01/24/2021 10/12/2020 05/20/2019  Decreased Interest 3 0 0  Down, Depressed, Hopeless 2 0 1  PHQ - 2 Score 5 0 1  Altered sleeping 3 3 2   Tired, decreased energy 3 3 3   Change in appetite 1 2 2   Feeling bad or failure about yourself  0 0 0  Trouble concentrating 3 2 1   Moving slowly or fidgety/restless 1 2 1   Suicidal thoughts 0 0 0  PHQ-9 Score 16 12 10   Difficult doing work/chores Somewhat difficult Somewhat difficult Very difficult    Relevant past medical, surgical, family and social history reviewed and updated as indicated. Interim medical history since our last visit reviewed. Allergies and medications reviewed and updated.  Review of Systems  Constitutional: Positive for fatigue. Negative for activity change, appetite change, diaphoresis and fever.  Respiratory: Negative for cough, chest tightness and shortness of breath.   Cardiovascular: Negative for chest pain, palpitations and leg swelling.  Gastrointestinal: Negative.   Endocrine: Negative for cold intolerance, heat intolerance, polydipsia, polyphagia and polyuria.  Neurological: Negative for dizziness, syncope, weakness, light-headedness, numbness and headaches.  Psychiatric/Behavioral: Positive for decreased concentration and sleep disturbance. Negative for self-injury and suicidal ideas. The patient is not nervous/anxious.     Per HPI unless specifically indicated above     Objective:    BP 135/85   Pulse 88 Comment: apical  Temp 98.2 F (36.8 C) (Oral)   Wt  174 lb 9.6 oz (79.2 kg)   SpO2 94%   BMI 30.93 kg/m   Wt Readings from Last 3 Encounters:  01/24/21 174 lb 9.6 oz (79.2 kg)  10/25/20 170 lb (77.1 kg)  10/15/20 174 lb (78.9 kg)    Physical Exam Vitals and nursing note reviewed.  Constitutional:      General: She is awake. She is not in acute distress.    Appearance: She is well-developed and well-groomed. She is obese.  She is not ill-appearing.  HENT:     Head: Normocephalic.     Right Ear: Hearing normal.     Left Ear: Hearing normal.  Eyes:     General: Lids are normal.        Right eye: No discharge.        Left eye: No discharge.     Conjunctiva/sclera: Conjunctivae normal.     Pupils: Pupils are equal, round, and reactive to light.  Neck:     Thyroid: No thyromegaly.     Vascular: No carotid bruit.  Cardiovascular:     Rate and Rhythm: Normal rate and regular rhythm.     Heart sounds: Normal heart sounds. No murmur heard. No gallop.   Pulmonary:     Effort: Pulmonary effort is normal. No accessory muscle usage or respiratory distress.     Breath sounds: Normal breath sounds.  Abdominal:     General: Bowel sounds are normal.     Palpations: Abdomen is soft.  Musculoskeletal:     Cervical back: Normal range of motion and neck supple.     Right lower leg: No edema.     Left lower leg: No edema.  Lymphadenopathy:     Cervical: No cervical adenopathy.  Skin:    General: Skin is warm and dry.  Neurological:     Mental Status: She is alert and oriented to person, place, and time.  Psychiatric:        Attention and Perception: Attention normal.        Mood and Affect: Mood normal.        Speech: Speech normal.        Behavior: Behavior normal. Behavior is cooperative.        Thought Content: Thought content normal.     Results for orders placed or performed in visit on 11/05/20  Novel Coronavirus, NAA ([facility])   Specimen: Saline  Result Value Ref Range   SARS-CoV-2, NAA Detected (A) Not Detected  SARS-COV-2, NAA 2 DAY TAT  Result Value Ref Range   SARS-CoV-2, NAA 2 DAY TAT Performed       Assessment & Plan:   Problem List Items Addressed This Visit      Respiratory   Centrilobular emphysema (HCC)    Chronic, ongoing.  Continue current inhaler regimen and adjust as needed.  Spirometry next visit.  Would benefit from Lung CA screening, will discuss next visit.      Relevant  Medications   nicotine (NICOTINE STEP 1) 21 mg/24hr patch     Endocrine   Hypothyroid    Chronic, ongoing, followed by Dr. Honor Junes and last seen 08/05/20.  Will recheck TSH and Free T4 today due to mood changes and fatigue.  At this time continue current medication regimen and adjust as needed.  Return in 4 weeks.      Relevant Orders   TSH   T4, free     Musculoskeletal and Integument   Rheumatoid arthritis involving multiple sites with positive rheumatoid  factor (HCC)    Chronic, ongoing.  Followed by rheumatology, she would like second opinion on her RA and pain.  Will place a referral to [facility] per patient request.  At this time continue current medications and adjust as needed.  CMP, Vit D, and B12 today.      Relevant Medications   leflunomide (ARAVA) 20 MG tablet   Other Relevant Orders   Ambulatory referral to Rheumatology   CBC with Differential/Platelet   Comprehensive metabolic panel   Vitamin B51     Other   Fibromyalgia    Chronic, ongoing, followed by rheumatology.  Would like [facility] opinion at [facility] on pain.  At this time continue Gabapentin as ordered by specialist and discussed at length with her adding on Duloxetine, which would benefit current mood and fibromyalgia pain.  She would like to trial this.  Script sent.  Return in 4 weeks to meet new PCP.      Relevant Medications   leflunomide (ARAVA) 20 MG tablet   DULoxetine (CYMBALTA) 30 MG capsule   Other Relevant Orders   Ambulatory referral to Rheumatology   Vitamin D deficiency    Chronic, check Vit D level today and continue supplement.  Recommend she take this consistently.      Relevant Orders   VITAMIN D 25 Hydroxy (Vit-D Deficiency, Fractures)   Elevated hemoglobin A1c    Ongoing with recent level 6.3%, recheck today and initiate medication as needed.      Relevant Orders   Microalbumin, Urine Waived   Hemoglobin A1c   Obesity    BMI 30.93.  Recommended eating smaller high protein, low fat  meals more frequently and exercising 30 mins a day 5 times a week with a goal of 10-15lb weight loss in the next 3 months. Patient voiced their understanding and motivation to adhere to these recommendations.       Memory changes    Noted worsening over past months, suspect multifactorial with increased stressors leading to worsening symptoms.  6CIT 8 last visit and PHQ9 16 today.  Have recommend she follow-up with neurology as soon as possible due to recent changes.  Will obtain labs today to further assess, to include CBC, TSH, A1c, and B12 level.        Depression, major, single episode, mild ([place]) - Primary    New diagnosis with PHQ9 = 16 today.  Discussed at length various options for treatment, including no treatment.  Denies SI/HI.  At this time would like to trial Duloxetine, which would offer benefit to both mood and underlying fibromyalgia and chronic pain.  Script sent and educated patient at length on this.  To return to office in 4 weeks for follow-up, sooner if worsening mood.  Would benefit return to therapy in future, refuses today.      Relevant Medications   DULoxetine (CYMBALTA) 30 MG capsule       Follow up plan: Return in about 4 weeks (around 02/21/2021) for MOOD & COPD -- spirometry needed + discuss lung CA CT screening.

## 2021-01-24 NOTE — Assessment & Plan Note (Signed)
New diagnosis with PHQ9 = 16 today.  Discussed at length various options for treatment, including no treatment.  Denies SI/HI.  At this time would like to trial Duloxetine, which would offer benefit to both mood and underlying fibromyalgia and chronic pain.  Script sent and educated patient at length on this.  To return to office in 4 weeks for follow-up, sooner if worsening mood.  Would benefit return to therapy in future, refuses today.

## 2021-01-24 NOTE — Assessment & Plan Note (Signed)
Ongoing with recent level 6.3%, recheck today and initiate medication as needed.

## 2021-01-24 NOTE — Assessment & Plan Note (Signed)
BMI 30.93.  Recommended eating smaller high protein, low fat meals more frequently and exercising 30 mins a day 5 times a week with a goal of 10-15lb weight loss in the next 3 months. Patient voiced their understanding and motivation to adhere to these recommendations.

## 2021-01-25 ENCOUNTER — Other Ambulatory Visit: Payer: Self-pay | Admitting: Nurse Practitioner

## 2021-01-25 ENCOUNTER — Encounter: Payer: Self-pay | Admitting: Allergy and Immunology

## 2021-01-25 ENCOUNTER — Ambulatory Visit (INDEPENDENT_AMBULATORY_CARE_PROVIDER_SITE_OTHER): Payer: Medicare Other | Admitting: Allergy and Immunology

## 2021-01-25 ENCOUNTER — Telehealth: Payer: Self-pay

## 2021-01-25 ENCOUNTER — Encounter: Payer: Self-pay | Admitting: Nurse Practitioner

## 2021-01-25 VITALS — BP 142/86 | HR 99 | Temp 97.3°F | Resp 18 | Ht 62.0 in | Wt 176.4 lb

## 2021-01-25 DIAGNOSIS — Z7952 Long term (current) use of systemic steroids: Secondary | ICD-10-CM | POA: Diagnosis not present

## 2021-01-25 DIAGNOSIS — T781XXD Other adverse food reactions, not elsewhere classified, subsequent encounter: Secondary | ICD-10-CM | POA: Diagnosis not present

## 2021-01-25 DIAGNOSIS — J3089 Other allergic rhinitis: Secondary | ICD-10-CM | POA: Diagnosis not present

## 2021-01-25 DIAGNOSIS — G43909 Migraine, unspecified, not intractable, without status migrainosus: Secondary | ICD-10-CM

## 2021-01-25 DIAGNOSIS — J449 Chronic obstructive pulmonary disease, unspecified: Secondary | ICD-10-CM

## 2021-01-25 DIAGNOSIS — F17210 Nicotine dependence, cigarettes, uncomplicated: Secondary | ICD-10-CM

## 2021-01-25 LAB — COMPREHENSIVE METABOLIC PANEL
ALT: 17 IU/L (ref 0–32)
AST: 12 IU/L (ref 0–40)
Albumin/Globulin Ratio: 1.7 (ref 1.2–2.2)
Albumin: 4.3 g/dL (ref 3.8–4.9)
Alkaline Phosphatase: 86 IU/L (ref 44–121)
BUN/Creatinine Ratio: 28 — ABNORMAL HIGH (ref 9–23)
BUN: 19 mg/dL (ref 6–24)
Bilirubin Total: <0.2 mg/dL (ref 0.0–1.2)
CO2: 19 mmol/L — ABNORMAL LOW (ref 20–29)
Calcium: 9.5 mg/dL (ref 8.7–10.2)
Chloride: 106 mmol/L (ref 96–106)
Creatinine, Ser: 0.67 mg/dL (ref 0.57–1.00)
Globulin, Total: 2.6 g/dL (ref 1.5–4.5)
Glucose: 237 mg/dL — ABNORMAL HIGH (ref 65–99)
Potassium: 4.3 mmol/L (ref 3.5–5.2)
Sodium: 146 mmol/L — ABNORMAL HIGH (ref 134–144)
Total Protein: 6.9 g/dL (ref 6.0–8.5)
eGFR: 106 mL/min/{1.73_m2} (ref >59)

## 2021-01-25 LAB — CBC WITH DIFFERENTIAL/PLATELET
Basophils Absolute: 0 10*3/uL (ref 0.0–0.2)
Basos: 0 %
EOS (ABSOLUTE): 0 10*3/uL (ref 0.0–0.4)
Eos: 0 %
Hematocrit: 46.5 % (ref 34.0–46.6)
Hemoglobin: 15.2 g/dL (ref 11.1–15.9)
Immature Grans (Abs): 0.1 10*3/uL (ref 0.0–0.1)
Immature Granulocytes: 1 %
Lymphocytes Absolute: 2.4 10*3/uL (ref 0.7–3.1)
Lymphs: 23 %
MCH: 30.8 pg (ref 26.6–33.0)
MCHC: 32.7 g/dL (ref 31.5–35.7)
MCV: 94 fL (ref 79–97)
Monocytes Absolute: 0.4 10*3/uL (ref 0.1–0.9)
Monocytes: 4 %
Neutrophils Absolute: 7.8 10*3/uL — ABNORMAL HIGH (ref 1.4–7.0)
Neutrophils: 72 %
Platelets: 429 10*3/uL (ref 150–450)
RBC: 4.93 x10E6/uL (ref 3.77–5.28)
RDW: 13.1 % (ref 11.7–15.4)
WBC: 10.7 10*3/uL (ref 3.4–10.8)

## 2021-01-25 LAB — VITAMIN B12: Vitamin B-12: 635 pg/mL (ref 232–1245)

## 2021-01-25 LAB — HEMOGLOBIN A1C
Est. average glucose Bld gHb Est-mCnc: 146 mg/dL
Hgb A1c MFr Bld: 6.7 % — ABNORMAL HIGH (ref 4.8–5.6)

## 2021-01-25 LAB — T4, FREE: Free T4: 1.29 ng/dL (ref 0.82–1.77)

## 2021-01-25 LAB — VITAMIN D 25 HYDROXY (VIT D DEFICIENCY, FRACTURES): Vit D, 25-Hydroxy: 23.2 ng/mL — ABNORMAL LOW (ref 30.0–100.0)

## 2021-01-25 LAB — TSH: TSH: 0.614 u[IU]/mL (ref 0.450–4.500)

## 2021-01-25 MED ORDER — ONETOUCH ULTRASOFT LANCETS MISC: 12 refills | Status: AC

## 2021-01-25 MED ORDER — ONETOUCH VERIO W/DEVICE KIT: PACK | 0 refills | Status: AC

## 2021-01-25 MED ORDER — METFORMIN HCL 500 MG PO TABS: 500.0000 mg | ORAL_TABLET | Freq: Two times a day (BID) | ORAL | 3 refills | Status: DC

## 2021-01-25 MED ORDER — CYPROHEPTADINE HCL 4 MG PO TABS: 4.0000 mg | ORAL_TABLET | Freq: Every evening | ORAL | 5 refills | Status: DC

## 2021-01-25 MED ORDER — GLUCOSE BLOOD VI STRP: ORAL_STRIP | 12 refills | Status: AC

## 2021-01-25 NOTE — Telephone Encounter (Signed)
Called to see if patient was going to return to her OV for Dr. Neldon Mc to have her labs drawn and also to see if she was going to schedule her follow up appointment with Dr. Neldon Mc per his OV note. Patient's mail box is full. Will send a copy of her labs as well as [facility] locations to her home.

## 2021-01-25 NOTE — Progress Notes (Signed)
[facility] [facility] [place]   Dear Marnee Guarneri,  Thank you for referring Catherine Giles to the [facility] on 01/25/2021.   Below is a summation of this patient's evaluation and recommendations.  Thank you for your referral. I will keep you informed about this patient's response to treatment.   If you have any questions please do not hesitate to contact me.   Sincerely,  Jiles Prows, MD Allergy / Immunology [place]   ______________________________________________________________________    NEW PATIENT NOTE  Referring Provider: Venita Lick, NP Primary Provider: Charlynne Cousins, MD Date of office visit: 01/25/2021    Subjective:   Chief Complaint:  Catherine Giles (DOB: 12-19-1970) is a 50 y.o. female who presents to the clinic on 01/25/2021 with a chief complaint of Food Intolerance (Dairy causes upset stomach and frequent bathroom use ) .     HPI: Catherine Giles presents to this clinic in evaluation of several issues.  One of her big concerns is the fact that she appears to have a food allergy.  If she eats dairy or egg she will develop diarrhea within minutes associated with abdominal cramping.  There is no other associated systemic or constitutional symptoms.  A similar type of issue occurred when eating muscles and clams greater than 10 years ago.  She developed intense GI pain and diarrhea.  She has eaten crab a few times since that event and on some of those occasions she is done well and on a few other she has developed a GI issue.  She also appears to have respiratory tract problems and is followed by pulmonology.  She has shortness of breath and coughing and gasping and she can not exert herself to any degree without getting intensely short of breath.  When she uses a short acting bronchodilator does not really help the symptoms.  She continues on  Trelegy at this point time and is also on prednisone at 10 mg daily.  She is on prednisone for her lung issues as well as rheumatoid arthritis.  She is much worse over the course of the past several years and since she contracted COVID earlier this year she believes that her respiratory tract symptoms have worsened.  She also has headaches almost on a daily basis involving most of her head for which she takes Motrin on a daily basis.  There is an issue with raspy voice and throat clearing and mucus stuck in her throat.  She does not have any classic reflux symptoms and does not consume any caffeine or chocolate.  She smokes up to 2 packs/day.  She has been doing so for a prolonged period in time.  Past Medical History:  Diagnosis Date  . Arthritis    RA  . Asthma   . COPD (chronic obstructive pulmonary disease) ([place])   . Emphysema of lung ([place])   . Family history of breast cancer    6/21 cancer genetic testing letter sent  . Hypertension   . Lupus ([place])   . Pre-diabetes   . Thyroid disease     Past Surgical History:  Procedure Laterality Date  . MOUTH SURGERY      Allergies as of 01/25/2021      Reactions   Azithromycin Anaphylaxis   Penicillins    Did it involve swelling of the face/tongue/throat, SOB, or low BP? Yes Did it involve sudden or severe  rash/hives, skin peeling, or any reaction on the inside of your mouth or nose? No Did you need to seek medical attention at a hospital or doctor's office? No When did it last happen? If all above answers are "NO", may proceed with cephalosporin use.   Shellfish Allergy       Medication List      albuterol 108 (90 Base) MCG/ACT inhaler Commonly known as: VENTOLIN HFA Inhale 2 puffs into the lungs every 4 (four) hours as needed.   amLODipine 5 MG tablet Commonly known as: NORVASC Take 1 tablet (5 mg total) by mouth daily.   chlorpheniramine-HYDROcodone 10-8 MG/5ML Suer Commonly known as: Tussionex Pennkinetic ER Take  5 mLs by mouth every 12 (twelve) hours as needed.   Cholecalciferol 1.25 MG (50000 UT) Tabs Take 1 tablet by mouth once a week. For 8 weeks and then stop.  Return to office for lab draw.   doxycycline 100 MG tablet Commonly known as: VIBRA-TABS Take 1 tablet (100 mg total) by mouth 2 (two) times daily.   DULoxetine 30 MG capsule Commonly known as: Cymbalta Start out with 30 mg (one tablet) once daily by mouth for 1 week, then increase to 60 mg (two tablets) once daily by mouth.   fluticasone 50 MCG/ACT nasal spray Commonly known as: FLONASE Place 2 sprays into both nostrils daily.   gabapentin 100 MG capsule Commonly known as: NEURONTIN Take by mouth.   ibuprofen 400 MG tablet Commonly known as: ADVIL Take by mouth.   ipratropium-albuterol 0.5-2.5 (3) MG/3ML Soln Commonly known as: DUONEB Take 3 mLs by nebulization every 6 (six) hours as needed for up to 7 days.   leflunomide 20 MG tablet Commonly known as: ARAVA Take 20 mg by mouth daily.   levofloxacin 500 MG tablet Commonly known as: LEVAQUIN Take 500 mg by mouth daily.   levothyroxine 50 MCG tablet Commonly known as: SYNTHROID Take 50 mcg by mouth daily before breakfast.   liothyronine 5 MCG tablet Commonly known as: CYTOMEL Take 5 mcg by mouth daily.   losartan 100 MG tablet Commonly known as: COZAAR Take 100 mg by mouth daily.   montelukast 10 MG tablet Commonly known as: SINGULAIR Take 10 mg by mouth at bedtime.   nicotine 21 mg/24hr patch Commonly known as: Nicotine Step 1 Place 1 patch (21 mg total) onto the skin daily.   predniSONE 10 MG tablet Commonly known as: DELTASONE 6 tabs today and tomorrow, 5 tabs the next 2 days, decrease by 1 every other day until gone   tiZANidine 4 MG tablet Commonly known as: ZANAFLEX Take 1 tablet (4 mg total) by mouth every 8 (eight) hours as needed for muscle spasms.   traMADol 50 MG tablet Commonly known as: ULTRAM Take 50 mg by mouth every 6 (six) hours  as needed.   Trelegy Ellipta 100-62.5-25 MCG/INH Aepb Generic drug: Fluticasone-Umeclidin-Vilant Inhale 1 puff into the lungs daily.   triamcinolone 0.1 % Commonly known as: KENALOG APPLY TO AFFECTED AREA(s) TWICE DAILY ASNEEDED AVOID FACE GROIN AND UNDERARMS       Review of systems negative except as noted in HPI / PMHx or noted below:  Review of Systems  Constitutional: Negative.   HENT: Negative.   Eyes: Negative.   Respiratory: Negative.   Cardiovascular: Negative.   Gastrointestinal: Negative.   Genitourinary: Negative.   Musculoskeletal: Negative.   Skin: Negative.   Neurological: Negative.   Endo/Heme/Allergies: Negative.   Psychiatric/Behavioral: Negative.     Family History  Problem Relation Age of Onset  . Lung cancer Mother   . Diabetes Mother   . Breast cancer Mother 25  . Hypertension Father   . Cancer Maternal Aunt   . Ovarian cancer Maternal Aunt   . Diabetes Maternal Aunt   . Diabetes Maternal Uncle   . Hypertension Maternal Grandmother   . Diabetes Maternal Grandmother     Social History   Socioeconomic History  . Marital status: Divorced    Spouse name: Not on file  . Number of children: Not on file  . Years of education: Not on file  . Highest education level: Not on file  Occupational History  . Not on file  Tobacco Use  . Smoking status: Current Every Day Smoker    Packs/day: 1.00    Years: 34.00    Pack years: 34.00    Types: Cigarettes  . Smokeless tobacco: Never Used  . Tobacco comment: 2-3 cig/day  Vaping Use  . Vaping Use: Never used  Substance and Sexual Activity  . Alcohol use: Yes    Comment: Occ  . Drug use: Not Currently  . Sexual activity: Yes  Other Topics Concern  . Not on file  Social History Narrative  . Not on file   Environmental and Social history  Lives in a house with a dry environment, no animals located inside the household, carpet in the bedroom, no plastic on the bed, no plastic on the pillow,  actively smoking tobacco products.  Objective:   Vitals:   01/25/21 1417  BP: (!) 142/86  Pulse: 99  Resp: 18  Temp: (!) 97.3 F (36.3 C)  SpO2: 98%   Height: 5\' 2"  (157.5 cm) Weight: 176 lb 6.4 oz (80 kg)  Physical Exam Constitutional:      Appearance: She is not diaphoretic.     Comments: Throat clearing, cough  HENT:     Head: Normocephalic.     Right Ear: Tympanic membrane, ear canal and external ear normal.     Left Ear: Tympanic membrane, ear canal and external ear normal.     Nose: Nose normal. No mucosal edema or rhinorrhea.     Mouth/Throat:     Pharynx: Uvula midline. No oropharyngeal exudate.  Eyes:     Conjunctiva/sclera: Conjunctivae normal.  Neck:     Thyroid: No thyromegaly.     Trachea: Trachea normal. No tracheal tenderness or tracheal deviation.  Cardiovascular:     Rate and Rhythm: Normal rate and regular rhythm.     Heart sounds: Normal heart sounds, S1 normal and S2 normal. No murmur heard.   Pulmonary:     Effort: No respiratory distress.     Breath sounds: Normal breath sounds. No stridor. No wheezing or rales.  Lymphadenopathy:     Head:     Right side of head: No tonsillar adenopathy.     Left side of head: No tonsillar adenopathy.     Cervical: No cervical adenopathy.  Skin:    Findings: No erythema or rash.     Nails: There is no clubbing.  Neurological:     Mental Status: She is alert.     Diagnostics: Allergy skin tests were performed.   Spirometry was performed and demonstrated an FEV1 of 1.26 @ 60 % of predicted. FEV1/FVC = 0.59  Oxygen saturation was 96% on room air at rest.  During walking up and down the hallway at a fast pace to the point where she became somewhat short of breath her  oxygen saturation never dropped below 94%.  The patient had an Asthma Control Test with the following results:  .    Results of blood tests obtained for April 2022 identified WBC 10.7, absolute eosinophils 0, absolute lymphocyte 2400,  hemoglobin 15.2, platelet 429.  Results of blood tests obtained 28 May 2019 identified IgG 908 mg/DL, IgM 65 NG/DL, IgA 350 mg/DL.  Results of a chest CT scan angio obtained 29 April 2020 identified the following:  Cardiovascular: Satisfactory opacification of the pulmonary arteries to the segmental level. No evidence of pulmonary embolism. Normal heart size. No pericardial effusion. No thoracic aortic aneurysm or dissection.  Mediastinum/Nodes: There are a few borderline enlarged bilateral axillary lymph nodes measuring up to 11 mm in short axis (series 4, image 6). No enlarged mediastinal or hilar lymph nodes. 2.5 x 1.9 cm mass in the superior mediastinum between the brachiocephalic and left common carotid arteries (series 4, image 19). The mass is separate from, but just below the thyroid gland, which is normal in appearance. The trachea and esophagus demonstrate no significant findings.  Lungs/Pleura: No focal consolidation, pleural effusion, or pneumothorax. Mild paraseptal emphysema. Mild mosaic attenuation. Scattered areas of linear scarring throughout both lungs. Small calcified granuloma in the right lower lobe. No suspicious pulmonary nodule.  Results of a chest x-ray obtained 25 November 2020 identified the following:  Cardiac shadow is within normal limits. Mild scarring is noted in the left lung stable from the prior study. No new focal infiltrate is seen. No pleural effusion is noted. No bony abnormality is noted.  Assessment and Plan:    1. Adverse food reaction, subsequent encounter   2. COPD with asthma ([place])   3. Perennial allergic rhinitis   4. Long term systemic steroid user   5. Migraine syndrome   6. Heavy tobacco smoker >10 cigarettes per day     1.  Allergen avoidance measures?  2.  Use nicotine substitutes to replace tobacco smoke exposure  3.  Continue all therapy prescribed by pulmonology and rheumatology  4.  Treat and prevent  headache:   A.  Periactin 4 mg -1/2-1 tablet at bedtime  B. Wean off daily motrin / analgesics  5.  Investigate food allergy in more detail with blood tests including shellfish panel, milk component panel, egg component panel, area 2 aeroallergen profile  6.  Food challenge???  Lactose intolerance???  EpiPen???  7.  Return in 4 weeks or earlier if problem  It should be noted that [facility] left the clinic today before we could complete her evaluation and discussion of her plan.  She never had blood test performed.  We have left a message for her asking her to return for her blood test.  She appears to have multiple problems including the fact that she is on systemic steroids on a daily basis, still has significant respiratory tract symptoms in the face of tobacco smoke exposure, has chronic headache and analgesic overuse headache, and may have a history consistent with food allergy.  We need to further investigate the extent of her atopic disease directed against foods with the blood test noted above and we can also further explore whether or not she has an aero allergen hypersensitivity with a area to aero allergen IgE profile.  Based upon the results of this test will make a determination about food challenge or providing her an EpiPen.  Some of her dairy issue may all be tied up with lactose intolerance.  I have given her Periactin to  help with what appears to be chronic daily headache with migraine.  I have asked her to return to this clinic in 4 weeks for reevaluation and I will contact her by telephone regarding the results of her blood test.  Jiles Prows, MD Allergy / [facility]

## 2021-01-25 NOTE — Patient Instructions (Addendum)
  1.  Allergen avoidance measures?  2.  Use nicotine substitutes to replace tobacco smoke exposure  3.  Continue all therapy prescribed by pulmonology and rheumatology  4.  Treat and prevent headache:   A.  Periactin 4 mg -1/2-1 tablet at bedtime  B. Wean off daily motrin / analgesics  5.  Investigate food allergy in more detail with blood tests including shellfish panel, milk component panel, egg component panel, area 2 aeroallergen profile  6.  Food challenge???  Lactose intolerance???  EpiPen???  7.  Return in 4 weeks or earlier if problem

## 2021-01-25 NOTE — Progress Notes (Signed)
Contacted via MyChart   Good evening Catherine Giles, your labs have returned: - Thyroid labs normal, continue Levothyroxine dosing. - CBC overall shows no anemia, we will continue to monitor.  - CMP shows elevation in glucose, which is expected as A1c (diabetes testing) is now in diabetic range at 6.7%.  Anything 6.5 or greater is considered diabetes.  I do recommend we start some Metformin for this twice a day, I will send this in and would like you to return 02/22/21 as scheduled to follow-up.  Sodium also a little elevated at 146, recommend increase your water intake and decrease salt intake.  I am also going to send in a glucometer with supplies.  I want your to check blood sugar twice a day, in morning before eating with goal <130 and then once 2 hours after a meal with goal <180.  Are you interested in diabetes diet education, I do recommend this, and can order if interested.  - Vitamin B12 is normal, but D remains low side however has improved since last check.  I recommend start taking Vitamin D3 2000 units daily, which you can obtain in vitamin section.  No further weekly doses needed.   Any questions? Keep being awesome!!  Thank you for allowing me to participate in your care. Kindest regards, Yusif Gnau

## 2021-01-26 ENCOUNTER — Encounter: Payer: Self-pay | Admitting: Allergy and Immunology

## 2021-01-27 ENCOUNTER — Telehealth: Payer: Self-pay | Admitting: *Deleted

## 2021-01-27 NOTE — Telephone Encounter (Signed)
Patient has been rescheduled for 03/08/2021

## 2021-01-27 NOTE — Chronic Care Management (AMB) (Signed)
  Care Management   Note  01/27/2021 Name: Ruth Kovich MRN: [id number] DOB: 09/10/1971  Dawna Jakes is a 50 y.o. year old female who is a primary care patient of Vigg, Avanti, MD and is actively engaged with the care management team. I reached out to [name] by phone today to assist with re-scheduling a follow up visit with the RN Case Manager  Follow up plan: Telephone appointment with care management team member scheduled for: 03/08/2021  [facility] [facility]

## 2021-01-27 NOTE — Telephone Encounter (Signed)
Please reschedule RNCM for [facility]

## 2021-01-28 ENCOUNTER — Telehealth: Payer: Self-pay

## 2021-01-28 NOTE — Telephone Encounter (Signed)
Copied from [place] [id number]. Topic: General - Other >> Jan 28, 2021  9:01 AM Catherine Giles A wrote: Reason for CRM: Patient would like to be contacted by a member of staff about lab results  Patient has concerns related to their glucose and would like to discuss them further  Patient declined to make an additional appointment at the time of call with agent  Please contact to advise when possible

## 2021-01-28 NOTE — Telephone Encounter (Signed)
Spoke with patient and notified her of recent lab results and also made her aware of recommendations per Dr.Jolene. Patient states she would like to have her daughter to log into her MyChart visit and review the results and recommendations in patient's chart and then if she has any questions, she will give our office a call.

## 2021-02-20 ENCOUNTER — Other Ambulatory Visit: Payer: Self-pay

## 2021-02-20 ENCOUNTER — Ambulatory Visit
Admission: EM | Admit: 2021-02-20 | Discharge: 2021-02-20 | Disposition: A | Discharge status: Home / Self Care | Payer: Medicare Other | Attending: Emergency Medicine | Admitting: Emergency Medicine

## 2021-02-20 DIAGNOSIS — R21 Rash and other nonspecific skin eruption: Secondary | ICD-10-CM

## 2021-02-20 MED ORDER — TRIAMCINOLONE ACETONIDE 0.1 % EX CREA: 1.0000 "application " | TOPICAL_CREAM | Freq: Two times a day (BID) | CUTANEOUS | 0 refills | Status: DC

## 2021-02-20 MED ORDER — CETIRIZINE HCL 10 MG PO TABS: 10.0000 mg | ORAL_TABLET | Freq: Every day | ORAL | 0 refills | Status: DC

## 2021-02-20 NOTE — ED Triage Notes (Signed)
Pt c/o multiple whelps on her back and the back of her legs, she noticed them yesterday. Pt states she was outside the day before. She states the areas are very itchy. She has not taken any medications for the areas. Pt denies f/v/d, does report some nausea.

## 2021-02-20 NOTE — Discharge Instructions (Signed)
The rash you have appears likely to be related to an insect bite.  You can continue with the prescribed cyproheptadine (don't fill the zyrtec I originally sent to the pharmacy as I didn't see you were already prescribed cyproheptadine).  Cool compresses. Avoid heat to the area.  Topical kenalog cream to the affected areas, twice a day until they resolve.  They should resolve in the next few days assuming you have no further exposure.  Return for any further concern

## 2021-02-20 NOTE — ED Provider Notes (Signed)
[facility]    CSN: [id number] Arrival date & time: 02/20/21  0937      History   Chief Complaint Chief Complaint  Patient presents with  . Rash    [place]?    HPI Catherine Giles is a 50 y.o. female.   Catherine Giles presents with complaints of rash. Primarily to her backside - posterior thighs, buttock and back. Noted it yesterday. Itchy. Raised areas. No pain. No specific known exposures, she was at someone's home that she is concerned about bed bugs? She is already on singulair and periactin for allergies. No other treatments tried. States she had similar episode around a year ago. History of RA, asthma/ copd, lupus, htn    ROS per HPI, negative if not otherwise mentioned.      Past Medical History:  Diagnosis Date  . Arthritis    RA  . Asthma   . COPD (chronic obstructive pulmonary disease) ([place])   . Emphysema of lung ([place])   . Family history of breast cancer    6/21 cancer genetic testing letter sent  . Hypertension   . Lupus ([place])   . Pre-diabetes   . Thyroid disease     Patient Active Problem List   Diagnosis Date Noted  . Depression, major, single episode, mild ([place]) 01/24/2021  . Memory changes 10/12/2020  . Dairy allergy 10/12/2020  . Headache 10/12/2020  . Vaginal stenosis 03/19/2020  . Obesity 03/19/2020  . Insomnia 01/05/2020  . Complex regional pain syndrome I of upper limb 12/17/2019  . Hypertension associated with diabetes ([place]) 12/13/2019  . Centrilobular emphysema ([place]) 12/13/2019  . Type 2 diabetes mellitus with obesity ([place]) 12/13/2019  . Hypothyroid 12/13/2019  . DDD (degenerative disc disease), lumbar 08/26/2019  . Multinodular goiter 08/14/2019  . Chronic left hip pain 05/05/2019  . Osteoarthritis of spine with radiculopathy, cervical region 05/05/2019  . Fibromyalgia 02/10/2019  . Rheumatoid arthritis involving multiple sites with positive rheumatoid factor ([place]) 02/10/2019  . Undifferentiated connective tissue disease  ([place]) 02/10/2019  . Cervical radiculopathy 03/13/2018  . Vitamin D deficiency 10/11/2015    Past Surgical History:  Procedure Laterality Date  . MOUTH SURGERY      OB History    Gravida  9   Para  6   Term      Preterm      AB  3   Living        SAB  2   IAB      Ectopic      Multiple      Live Births               Home Medications    Prior to Admission medications   Medication Sig Start Date End Date Taking? Authorizing Provider  albuterol (VENTOLIN HFA) 108 (90 Base) MCG/ACT inhaler Inhale 2 puffs into the lungs every 4 (four) hours as needed.  02/12/19 02/20/21 Yes [provider]  amLODipine (NORVASC) 5 MG tablet Take 1 tablet (5 mg total) by mouth daily. 10/12/20  Yes Cannady, Jolene T, NP  Cholecalciferol 1.25 MG (50000 UT) TABS Take 1 tablet by mouth once a week. For 8 weeks and then stop.  Return to office for lab draw. 10/13/20  Yes Cannady, Jolene T, NP  cyproheptadine (PERIACTIN) 4 MG tablet Take 1 tablet (4 mg total) by mouth at bedtime. 01/25/21  Yes Kozlow, Donnamarie Poag, MD  DULoxetine (CYMBALTA) 30 MG capsule Start out with 30 mg (one tablet) once daily by  mouth for 1 week, then increase to 60 mg (two tablets) once daily by mouth. 01/24/21  Yes Cannady, Jolene T, NP  fluticasone (FLONASE) 50 MCG/ACT nasal spray Place 2 sprays into both nostrils daily. 07/01/20  Yes Rodriguez-Southworth, Sunday Spillers, PA-C  Fluticasone-Umeclidin-Vilant (TRELEGY ELLIPTA) 100-62.5-25 MCG/INH AEPB Inhale 1 puff into the lungs daily. 11/29/20  Yes [provider]  gabapentin (NEURONTIN) 100 MG capsule Take by mouth. 09/01/20  Yes [provider]  ibuprofen (ADVIL) 400 MG tablet Take by mouth. 06/03/20  Yes [provider]  ipratropium-albuterol (DUONEB) 0.5-2.5 (3) MG/3ML SOLN Take 3 mLs by nebulization every 6 (six) hours as needed for up to 7 days. 06/12/20 06/19/20 Yes Danton Clap, PA-C  leflunomide (ARAVA) 20 MG tablet Take 20 mg by mouth daily. 01/22/21   Yes [provider]  levothyroxine (SYNTHROID) 50 MCG tablet Take 50 mcg by mouth daily before breakfast.    Yes [provider]  liothyronine (CYTOMEL) 5 MCG tablet Take 5 mcg by mouth daily. 08/05/20  Yes [provider]  losartan (COZAAR) 100 MG tablet Take 100 mg by mouth daily.    Yes [provider]  metFORMIN (GLUCOPHAGE) 500 MG tablet Take 1 tablet (500 mg total) by mouth 2 (two) times daily with a meal. 01/25/21  Yes Cannady, Jolene T, NP  montelukast (SINGULAIR) 10 MG tablet Take 10 mg by mouth at bedtime. 12/29/20 12/29/21 Yes [provider]  tiZANidine (ZANAFLEX) 4 MG tablet Take 1 tablet (4 mg total) by mouth every 8 (eight) hours as needed for muscle spasms. 08/23/20  Yes Eulogio Bear, NP  traMADol (ULTRAM) 50 MG tablet Take 50 mg by mouth every 6 (six) hours as needed.    Yes [provider]  triamcinolone cream (KENALOG) 0.1 % Apply 1 application topically 2 (two) times daily. 02/20/21  Yes Augusto Gamble B, NP  cetirizine (ZYRTEC) 10 MG tablet Take 1 tablet (10 mg total) by mouth daily. 02/20/21 02/20/21 Yes Jocabed Cheese, Malachy Moan, NP  Blood Glucose Monitoring Suppl (ONETOUCH VERIO) w/Device KIT Use to check blood sugar 3 times daily with goal <130 fasting and <180 two hours after meal.  Bring meter to visits. 01/25/21   Marnee Guarneri T, NP  glucose blood test strip Use to check blood sugar 3 times daily with goal <130 fasting and <180 two hours after meal. 01/25/21   Cannady, Henrine Screws T, NP  Lancets (ONETOUCH ULTRASOFT) lancets Use to check blood sugar 3 times daily with goal <130 fasting and <180 two hours after meal. 01/25/21   Cannady, Jolene T, NP  nicotine (NICOTINE STEP 1) 21 mg/24hr patch Place 1 patch (21 mg total) onto the skin daily. 01/24/21   Cannady, Henrine Screws T, NP  traZODone (DESYREL) 50 MG tablet Take 1 tablet (50 mg total) by mouth at bedtime. 01/05/20 07/01/20  Venita Lick, NP    Family History Family History  Problem Relation  Age of Onset  . Lung cancer Mother   . Diabetes Mother   . Breast cancer Mother 45  . Hypertension Father   . Cancer Maternal Aunt   . Ovarian cancer Maternal Aunt   . Diabetes Maternal Aunt   . Diabetes Maternal Uncle   . Hypertension Maternal Grandmother   . Diabetes Maternal Grandmother     Social History Social History   Tobacco Use  . Smoking status: Current Every Day Smoker    Packs/day: 1.00    Years: 34.00    Pack years: 34.00  Types: Cigarettes  . Smokeless tobacco: Never Used  . Tobacco comment: 2-3 cig/day  Vaping Use  . Vaping Use: Never used  Substance Use Topics  . Alcohol use: Yes    Comment: Occ  . Drug use: Not Currently     Allergies   Azithromycin, Penicillins, and Shellfish allergy   Review of Systems Review of Systems   Physical Exam Triage Vital Signs ED Triage Vitals  Enc Vitals Group     BP 02/20/21 0950 (!) 145/90     Pulse Rate 02/20/21 0950 88     Resp 02/20/21 0950 18     Temp 02/20/21 0950 98.4 F (36.9 C)     Temp Source 02/20/21 0950 Oral     SpO2 02/20/21 0950 95 %     Weight 02/20/21 0948 167 lb (75.8 kg)     Height 02/20/21 0948 _0  (1.6 m)     Head Circumference --      Peak Flow --      Pain Score 02/20/21 0948 0     Pain Loc --      Pain Edu? --      Excl. in [place]? --    No data found.  Updated Vital Signs BP (!) 145/90 (BP Location: Left Arm)   Pulse 88   Temp 98.4 F (36.9 C) (Oral)   Resp 18   Ht _1  (1.6 m)   Wt 167 lb (75.8 kg)   SpO2 95%   BMI 29.58 kg/m   Visual Acuity Right Eye Distance:   Left Eye Distance:   Bilateral Distance:    Right Eye Near:   Left Eye Near:    Bilateral Near:     Physical Exam Constitutional:      General: She is not in acute distress.    Appearance: She is well-developed.  Cardiovascular:     Rate and Rhythm: Normal rate.  Pulmonary:     Effort: Pulmonary effort is normal.  Skin:    General: Skin is warm and dry.     Findings: Rash is not vesicular.      Comments: Red raised welts to posterior thighs, to underwear line, and to back noted; non tender; no pustule  Neurological:     Mental Status: She is alert and oriented to person, place, and time.      UC Treatments / Results  Labs (all labs ordered are listed, but only abnormal results are displayed) Labs Reviewed - No data to display  EKG   Radiology No results found.  Procedures Procedures (including critical care time)  Medications Ordered in UC Medications - No data to display  Initial Impression / Assessment and Plan / UC Course  I have reviewed the triage vital signs and the nursing notes.  Pertinent labs & imaging results that were available during my care of the patient were reviewed by me and considered in my medical decision making (see chart for details).     Appearance of rash is consistent with bug bites such as mosquito even. No specific known exposure. Itchy. No pain. No obvious source of infection. Supportive cares recommended. Return precautions provided. Patient verbalized understanding and agreeable to plan.   Final Clinical Impressions(s) / UC Diagnoses   Final diagnoses:  Rash and nonspecific skin eruption     Discharge Instructions     The rash you have appears likely to be related to an insect bite.  You can continue with the prescribed cyproheptadine (don't fill the  zyrtec I originally sent to the pharmacy as I didn't see you were already prescribed cyproheptadine).  Cool compresses. Avoid heat to the area.  Topical kenalog cream to the affected areas, twice a day until they resolve.  They should resolve in the next few days assuming you have no further exposure.  Return for any further concern     ED Prescriptions    Medication Sig Dispense Auth. Provider   triamcinolone cream (KENALOG) 0.1 % Apply 1 application topically 2 (two) times daily. 30 g Augusto Gamble B, NP   cetirizine (ZYRTEC) 10 MG tablet  (Status: Discontinued) Take 1  tablet (10 mg total) by mouth daily. 30 tablet Zigmund Gottron, NP     PDMP not reviewed this encounter.   Zigmund Gottron, NP 02/20/21 1009

## 2021-02-22 ENCOUNTER — Ambulatory Visit: Payer: Medicare Other | Admitting: Internal Medicine

## 2021-02-28 ENCOUNTER — Other Ambulatory Visit: Payer: Self-pay

## 2021-02-28 ENCOUNTER — Ambulatory Visit: Payer: Medicare Other

## 2021-02-28 ENCOUNTER — Telehealth: Payer: Medicaid Other

## 2021-02-28 ENCOUNTER — Ambulatory Visit: Payer: Medicare Other | Admitting: Internal Medicine

## 2021-02-28 ENCOUNTER — Telehealth (INDEPENDENT_AMBULATORY_CARE_PROVIDER_SITE_OTHER): Payer: Medicare Other | Admitting: Internal Medicine

## 2021-02-28 DIAGNOSIS — J019 Acute sinusitis, unspecified: Secondary | ICD-10-CM | POA: Diagnosis not present

## 2021-02-28 DIAGNOSIS — Z1152 Encounter for screening for COVID-19: Secondary | ICD-10-CM

## 2021-02-28 MED ORDER — FEXOFENADINE HCL 180 MG PO TABS: 180.0000 mg | ORAL_TABLET | Freq: Every day | ORAL | 0 refills | Status: DC

## 2021-02-28 MED ORDER — LACTOBACILLUS PO PACK: 1.0000 | PACK | ORAL | 0 refills | Status: DC | PRN

## 2021-02-28 NOTE — Progress Notes (Signed)
There were no vitals taken for this visit.   Subjective:    Patient ID: Catherine Giles, female    DOB: [birth year], 50 y.o.   MRN: [id number]  HPI: Catherine Giles is a 50 y.o. female  I connected with Catherine Giles on 02/28/21 by a video enabled telemedicine application and verified that I am speaking with the correct person using two identifiers.  I discussed the limitations of evaluation and management by telemedicine. The patient expressed understanding and agreed to proceed. "I discussed the limitations of evaluation and management by telemedicine and the availability of in person appointments. The patient expressed understanding and agreed to proceed"     This visit was completed via MyChart due to the restrictions of the COVID-19 pandemic. All issues as above were discussed and addressed. Physical exam was done as above through visual confirmation on MyChart. If it was felt that the patient should be evaluated in the office, they were directed there. The patient verbally consented to this visit. Location of the patient: in car Location of the provider: work Those involved with this call:  Provider: Charlynne Cousins, MD CMA: Frazier Butt, [place] Desk/Registration: Barth Kirks  Time spent on call: 10 minutes with patient face to face via video conference. More than 50% of this time was spent in counseling and coordination of care. 10 minutes total spent in review of patient's record and preparation of their chart.  Headache, congestion x Friday very little and  Mucus d/c noted, taking tx - albuterol q 4 hrly  trelegy inahlers and nebs  Feels tight in her chest per pt.  Pt says she feels very congested / heavy on her chest.  deneis fever   Headache  This is a new problem. Associated symptoms include coughing. Pertinent negatives include no abdominal pain, fever, vomiting or weight loss.  Diarrhea  This is a new (about 5 times on friday / saturday ) problem. The current episode  started in the past 7 days. The stool consistency is described as mucous and watery. Associated symptoms include coughing, headaches and a URI. Pertinent negatives include no abdominal pain, arthralgias, bloating, chills, fever, increased  flatus, myalgias, sweats, vomiting or weight loss. Associated symptoms comments: Light phlegm per pt - clear .    No chief complaint on file.   Relevant past medical, surgical, family and social history reviewed and updated as indicated. Interim medical history since our last visit reviewed. Allergies and medications reviewed and updated.  Review of Systems  Constitutional: Negative for chills, fever and weight loss.  Respiratory: Positive for cough.   Gastrointestinal: Positive for diarrhea. Negative for abdominal pain, bloating, flatus and vomiting.  Musculoskeletal: Negative for arthralgias and myalgias.  Neurological: Positive for headaches.    Per HPI unless specifically indicated above     Objective:    There were no vitals taken for this visit.  Wt Readings from Last 3 Encounters:  02/20/21 167 lb (75.8 kg)  01/25/21 176 lb 6.4 oz (80 kg)  01/24/21 174 lb 9.6 oz (79.2 kg)    Physical Exam Vitals reviewed: unable to perform sec to virtual visit.      Results for orders placed or performed in visit on 01/24/21  TSH  Result Value Ref Range   TSH 0.614 0.450 - 4.500 uIU/mL  CBC with Differential/Platelet  Result Value Ref Range   WBC 10.7 3.4 - 10.8 x10E3/uL   RBC 4.93 3.77 - 5.28 x10E6/uL   Hemoglobin 15.2 11.1 - 15.9 g/dL  Hematocrit 46.5 34.0 - 46.6 %   MCV 94 79 - 97 fL   MCH 30.8 26.6 - 33.0 pg   MCHC 32.7 31.5 - 35.7 g/dL   RDW 13.1 11.7 - 15.4 %   Platelets 429 150 - 450 x10E3/uL   Neutrophils 72 Not Estab. %   Lymphs 23 Not Estab. %   Monocytes 4 Not Estab. %   Eos 0 Not Estab. %   Basos 0 Not Estab. %   Neutrophils Absolute 7.8 (H) 1.4 - 7.0 x10E3/uL   Lymphocytes Absolute 2.4 0.7 - 3.1 x10E3/uL   Monocytes  Absolute 0.4 0.1 - 0.9 x10E3/uL   EOS (ABSOLUTE) 0.0 0.0 - 0.4 x10E3/uL   Basophils Absolute 0.0 0.0 - 0.2 x10E3/uL   Immature Granulocytes 1 Not Estab. %   Immature Grans (Abs) 0.1 0.0 - 0.1 x10E3/uL  Comprehensive metabolic panel  Result Value Ref Range   Glucose 237 (H) 65 - 99 mg/dL   BUN 19 6 - 24 mg/dL   Creatinine, Ser 0.67 0.57 - 1.00 mg/dL   eGFR 106 >59 mL/min/1.73   BUN/Creatinine Ratio 28 (H) 9 - 23   Sodium 146 (H) 134 - 144 mmol/L   Potassium 4.3 3.5 - 5.2 mmol/L   Chloride 106 96 - 106 mmol/L   CO2 19 (L) 20 - 29 mmol/L   Calcium 9.5 8.7 - 10.2 mg/dL   Total Protein 6.9 6.0 - 8.5 g/dL   Albumin 4.3 3.8 - 4.9 g/dL   Globulin, Total 2.6 1.5 - 4.5 g/dL   Albumin/Globulin Ratio 1.7 1.2 - 2.2   Bilirubin Total <0.2 0.0 - 1.2 mg/dL   Alkaline Phosphatase 86 44 - 121 IU/L   AST 12 0 - 40 IU/L   ALT 17 0 - 32 IU/L  VITAMIN D 25 Hydroxy (Vit-D Deficiency, Fractures)  Result Value Ref Range   Vit D, 25-Hydroxy 23.2 (L) 30.0 - 100.0 ng/mL  Vitamin B12  Result Value Ref Range   Vitamin B-12 635 232 - 1,245 pg/mL  T4, free  Result Value Ref Range   Free T4 1.29 0.82 - 1.77 ng/dL  Hemoglobin A1c  Result Value Ref Range   Hgb A1c MFr Bld 6.7 (H) 4.8 - 5.6 %   Est. average glucose Bld gHb Est-mCnc 146 mg/dL        Current Outpatient Medications:  .  albuterol (VENTOLIN HFA) 108 (90 Base) MCG/ACT inhaler, Inhale 2 puffs into the lungs every 4 (four) hours as needed. , Disp: , Rfl:  .  amLODipine (NORVASC) 5 MG tablet, Take 1 tablet (5 mg total) by mouth daily., Disp: 90 tablet, Rfl: 3 .  Blood Glucose Monitoring Suppl (ONETOUCH VERIO) w/Device KIT, Use to check blood sugar 3 times daily with goal <130 fasting and <180 two hours after meal.  Bring meter to visits., Disp: 1 kit, Rfl: 0 .  Cholecalciferol 1.25 MG (50000 UT) TABS, Take 1 tablet by mouth once a week. For 8 weeks and then stop.  Return to office for lab draw., Disp: 8 tablet, Rfl: 0 .  cyproheptadine  (PERIACTIN) 4 MG tablet, Take 1 tablet (4 mg total) by mouth at bedtime., Disp: 30 tablet, Rfl: 5 .  DULoxetine (CYMBALTA) 30 MG capsule, Start out with 30 mg (one tablet) once daily by mouth for 1 week, then increase to 60 mg (two tablets) once daily by mouth., Disp: 180 capsule, Rfl: 4 .  fluticasone (FLONASE) 50 MCG/ACT nasal spray, Place 2 sprays into both nostrils daily.,  Disp: 16 g, Rfl: 0 .  Fluticasone-Umeclidin-Vilant (TRELEGY ELLIPTA) 100-62.5-25 MCG/INH AEPB, Inhale 1 puff into the lungs daily., Disp: , Rfl:  .  gabapentin (NEURONTIN) 100 MG capsule, Take by mouth., Disp: , Rfl:  .  glucose blood test strip, Use to check blood sugar 3 times daily with goal <130 fasting and <180 two hours after meal., Disp: 100 each, Rfl: 12 .  ibuprofen (ADVIL) 400 MG tablet, Take by mouth., Disp: , Rfl:  .  ipratropium-albuterol (DUONEB) 0.5-2.5 (3) MG/3ML SOLN, Take 3 mLs by nebulization every 6 (six) hours as needed for up to 7 days., Disp: 360 mL, Rfl: 0 .  Lancets (ONETOUCH ULTRASOFT) lancets, Use to check blood sugar 3 times daily with goal <130 fasting and <180 two hours after meal., Disp: 100 each, Rfl: 12 .  leflunomide (ARAVA) 20 MG tablet, Take 20 mg by mouth daily., Disp: , Rfl:  .  levothyroxine (SYNTHROID) 50 MCG tablet, Take 50 mcg by mouth daily before breakfast. , Disp: , Rfl:  .  liothyronine (CYTOMEL) 5 MCG tablet, Take 5 mcg by mouth daily., Disp: , Rfl:  .  losartan (COZAAR) 100 MG tablet, Take 100 mg by mouth daily. , Disp: , Rfl:  .  metFORMIN (GLUCOPHAGE) 500 MG tablet, Take 1 tablet (500 mg total) by mouth 2 (two) times daily with a meal., Disp: 180 tablet, Rfl: 3 .  montelukast (SINGULAIR) 10 MG tablet, Take 10 mg by mouth at bedtime., Disp: , Rfl:  .  nicotine (NICOTINE STEP 1) 21 mg/24hr patch, Place 1 patch (21 mg total) onto the skin daily., Disp: 28 patch, Rfl: 0 .  tiZANidine (ZANAFLEX) 4 MG tablet, Take 1 tablet (4 mg total) by mouth every 8 (eight) hours as needed for  muscle spasms., Disp: 90 tablet, Rfl: 0 .  traMADol (ULTRAM) 50 MG tablet, Take 50 mg by mouth every 6 (six) hours as needed. , Disp: , Rfl:  .  triamcinolone cream (KENALOG) 0.1 %, Apply 1 application topically 2 (two) times daily., Disp: 30 g, Rfl: 0    Assessment & Plan:  Pt  Advised to call the office or go to the ER if she develops SOB , worsening Diarrhea/ abdominal pain / any bleeding,  dizziness   Pt verbalized understanding of such. COVID : test done today  Increase fluid intake.  Headahce - tyelnol every 4-6 hrs prn and alternate this with ibubrufen 800 mg q 8 hrly. Sinus pressure: use steam inhalation.  OTC -  Allegra / claritin.      Problem List Items Addressed This Visit   None    Visit Diagnoses     Encounter for screening for COVID-19    -  Primary   Relevant Orders   Novel Coronavirus, NAA ([facility])        Follow up plan: No follow-ups on file.

## 2021-03-01 LAB — SARS-COV-2, NAA 2 DAY TAT

## 2021-03-01 LAB — NOVEL CORONAVIRUS, NAA: SARS-CoV-2, NAA: NOT DETECTED

## 2021-03-03 DIAGNOSIS — M797 Fibromyalgia: Secondary | ICD-10-CM | POA: Diagnosis not present

## 2021-03-03 DIAGNOSIS — Z79899 Other long term (current) drug therapy: Secondary | ICD-10-CM | POA: Diagnosis not present

## 2021-03-03 DIAGNOSIS — M0579 Rheumatoid arthritis with rheumatoid factor of multiple sites without organ or systems involvement: Secondary | ICD-10-CM | POA: Diagnosis not present

## 2021-03-06 ENCOUNTER — Encounter: Payer: Self-pay | Admitting: Emergency Medicine

## 2021-03-06 ENCOUNTER — Ambulatory Visit
Admission: EM | Admit: 2021-03-06 | Discharge: 2021-03-06 | Disposition: A | Discharge status: Home / Self Care | Payer: Medicare Other | Attending: Sports Medicine | Admitting: Sports Medicine

## 2021-03-06 ENCOUNTER — Other Ambulatory Visit: Payer: Self-pay

## 2021-03-06 ENCOUNTER — Ambulatory Visit (INDEPENDENT_AMBULATORY_CARE_PROVIDER_SITE_OTHER): Payer: Medicare Other

## 2021-03-06 DIAGNOSIS — F17210 Nicotine dependence, cigarettes, uncomplicated: Secondary | ICD-10-CM | POA: Insufficient documentation

## 2021-03-06 DIAGNOSIS — R0789 Other chest pain: Secondary | ICD-10-CM | POA: Insufficient documentation

## 2021-03-06 DIAGNOSIS — Z79899 Other long term (current) drug therapy: Secondary | ICD-10-CM | POA: Insufficient documentation

## 2021-03-06 DIAGNOSIS — R4589 Other symptoms and signs involving emotional state: Secondary | ICD-10-CM | POA: Diagnosis not present

## 2021-03-06 DIAGNOSIS — I10 Essential (primary) hypertension: Secondary | ICD-10-CM | POA: Diagnosis not present

## 2021-03-06 DIAGNOSIS — R079 Chest pain, unspecified: Secondary | ICD-10-CM

## 2021-03-06 DIAGNOSIS — J439 Emphysema, unspecified: Secondary | ICD-10-CM | POA: Diagnosis not present

## 2021-03-06 DIAGNOSIS — J449 Chronic obstructive pulmonary disease, unspecified: Secondary | ICD-10-CM

## 2021-03-06 DIAGNOSIS — R059 Cough, unspecified: Secondary | ICD-10-CM

## 2021-03-06 DIAGNOSIS — R0602 Shortness of breath: Secondary | ICD-10-CM

## 2021-03-06 DIAGNOSIS — I517 Cardiomegaly: Secondary | ICD-10-CM | POA: Diagnosis not present

## 2021-03-06 DIAGNOSIS — M940 Chondrocostal junction syndrome [Tietze]: Secondary | ICD-10-CM | POA: Diagnosis not present

## 2021-03-06 DIAGNOSIS — Z20822 Contact with and (suspected) exposure to covid-19: Secondary | ICD-10-CM | POA: Diagnosis not present

## 2021-03-06 DIAGNOSIS — F411 Generalized anxiety disorder: Secondary | ICD-10-CM | POA: Diagnosis not present

## 2021-03-06 NOTE — Discharge Instructions (Addendum)
As we discussed, your EKG does not show any acute cardiac issues at this time.  Your chest x-ray does not show any change from prior x-rays with no pneumonia or collapse of your lung. As we discussed, I recommended you going to the ER to have a higher level of care and potentially a scan of your chest.  You declined on that and wanted go home with anti-inflammatory medicines and call your primary care physician and request that this can be done in the outpatient setting.  Again this is your choice however I would recommend that if your symptoms persist or worsen that you call 911 and go to the emergency room.

## 2021-03-06 NOTE — ED Provider Notes (Signed)
[facility]    CSN: [id number] Arrival date & time: 03/06/21  7026      History   Chief Complaint Chief Complaint  Patient presents with  . Chest Pain    HPI Catherine Giles is a 50 y.o. female.   50 year old female who presents for evaluation of the above issue.  Normally sees [facility] in [place].  She is on disability and does not work outside the home.  She reports started getting left-sided chest pain last evening.  She reports it is behind her breast on the left side.  She said she took some aspirin without relief.  She says that symptoms are worse if she takes a deep breath or coughs or even with talking.  She said the cough started this morning.  She is very tearful throughout triage and the encounter.  She has no history of heart disease.  I reviewed her chart in detail.  She has multiple medical issues but the most significant is COPD, emphysema, and asthma.  Despite this she continues to smoke.  She sees [facility] pulmonology.  She also sees rheumatology, allergy and immunology, and neurology for other medical issues.  She denies any accidents trauma falls twists that precipitated this pain.  She is very concerned that she may have a collapsed lung and is asking for a chest x-ray.  At times during the history she is inconsolable.     Past Medical History:  Diagnosis Date  . Arthritis    RA  . Asthma   . COPD (chronic obstructive pulmonary disease) ([place])   . Emphysema of lung ([place])   . Family history of breast cancer    6/21 cancer genetic testing letter sent  . Hypertension   . Lupus ([place])   . Pre-diabetes   . Thyroid disease     Patient Active Problem List   Diagnosis Date Noted  . Depression, major, single episode, mild ([place]) 01/24/2021  . Memory changes 10/12/2020  . Dairy allergy 10/12/2020  . Headache 10/12/2020  . Vaginal stenosis 03/19/2020  . Obesity 03/19/2020  . Insomnia 01/05/2020  . Complex regional pain syndrome  I of upper limb 12/17/2019  . Hypertension associated with diabetes ([place]) 12/13/2019  . Centrilobular emphysema ([place]) 12/13/2019  . Type 2 diabetes mellitus with obesity ([place]) 12/13/2019  . Hypothyroid 12/13/2019  . DDD (degenerative disc disease), lumbar 08/26/2019  . Multinodular goiter 08/14/2019  . Chronic left hip pain 05/05/2019  . Osteoarthritis of spine with radiculopathy, cervical region 05/05/2019  . Fibromyalgia 02/10/2019  . Rheumatoid arthritis involving multiple sites with positive rheumatoid factor ([place]) 02/10/2019  . Undifferentiated connective tissue disease ([place]) 02/10/2019  . Cervical radiculopathy 03/13/2018  . Vitamin D deficiency 10/11/2015    Past Surgical History:  Procedure Laterality Date  . MOUTH SURGERY      OB History    Gravida  9   Para  6   Term      Preterm      AB  3   Living        SAB  2   IAB      Ectopic      Multiple      Live Births               Home Medications    Prior to Admission medications   Medication Sig Start Date End Date Taking? Authorizing Provider  amLODipine (NORVASC) 5 MG tablet Take 1 tablet (5 mg total) by mouth  daily. 10/12/20  Yes Cannady, Jolene T, NP  Cholecalciferol 1.25 MG (50000 UT) TABS Take 1 tablet by mouth once a week. For 8 weeks and then stop.  Return to office for lab draw. 10/13/20  Yes Cannady, Jolene T, NP  fexofenadine (ALLEGRA ALLERGY) 180 MG tablet Take 1 tablet (180 mg total) by mouth daily. 02/28/21  Yes Vigg, Avanti, MD  fluticasone (FLONASE) 50 MCG/ACT nasal spray Place 2 sprays into both nostrils daily. 07/01/20  Yes Rodriguez-Southworth, Sunday Spillers, PA-C  Fluticasone-Umeclidin-Vilant (TRELEGY ELLIPTA) 100-62.5-25 MCG/INH AEPB Inhale 1 puff into the lungs daily. 11/29/20  Yes [provider]  gabapentin (NEURONTIN) 100 MG capsule Take by mouth. 09/01/20  Yes [provider]  leflunomide (ARAVA) 20 MG tablet Take 20 mg by mouth daily. 01/22/21  Yes [provider]  levothyroxine (SYNTHROID) 50 MCG tablet Take 50 mcg by mouth daily before breakfast.    Yes [provider]  liothyronine (CYTOMEL) 5 MCG tablet Take 5 mcg by mouth daily. 08/05/20  Yes [provider]  losartan (COZAAR) 100 MG tablet Take 100 mg by mouth daily.    Yes [provider]  metFORMIN (GLUCOPHAGE) 500 MG tablet Take 1 tablet (500 mg total) by mouth 2 (two) times daily with a meal. 01/25/21  Yes Cannady, Jolene T, NP  montelukast (SINGULAIR) 10 MG tablet Take 10 mg by mouth at bedtime. 12/29/20 12/29/21 Yes [provider]  traMADol (ULTRAM) 50 MG tablet Take 50 mg by mouth every 6 (six) hours as needed.    Yes [provider]  albuterol (VENTOLIN HFA) 108 (90 Base) MCG/ACT inhaler Inhale 2 puffs into the lungs every 4 (four) hours as needed.  02/12/19 02/20/21  [provider]  Blood Glucose Monitoring Suppl (ONETOUCH VERIO) w/Device KIT Use to check blood sugar 3 times daily with goal <130 fasting and <180 two hours after meal.  Bring meter to visits. 01/25/21   Cannady, Henrine Screws T, NP  cyproheptadine (PERIACTIN) 4 MG tablet Take 1 tablet (4 mg total) by mouth at bedtime. 01/25/21   Kozlow, Donnamarie Poag, MD  DULoxetine (CYMBALTA) 30 MG capsule Start out with 30 mg (one tablet) once daily by mouth for 1 week, then increase to 60 mg (two tablets) once daily by mouth. 01/24/21   Marnee Guarneri T, NP  glucose blood test strip Use to check blood sugar 3 times daily with goal <130 fasting and <180 two hours after meal. 01/25/21   Cannady, Jolene T, NP  ibuprofen (ADVIL) 400 MG tablet Take by mouth. 06/03/20   [provider]  ipratropium-albuterol (DUONEB) 0.5-2.5 (3) MG/3ML SOLN Take 3 mLs by nebulization every 6 (six) hours as needed for up to 7 days. 06/12/20 06/19/20  Laurene Footman B, PA-C  Lactobacillus PACK Take 1 each by mouth as needed (for diarrhea.). 02/28/21   Charlynne Cousins, MD  Lancets [facility] ULTRASOFT) lancets Use to check blood sugar 3 times  daily with goal <130 fasting and <180 two hours after meal. 01/25/21   Cannady, Jolene T, NP  nicotine (NICOTINE STEP 1) 21 mg/24hr patch Place 1 patch (21 mg total) onto the skin daily. 01/24/21   Cannady, Henrine Screws T, NP  tiZANidine (ZANAFLEX) 4 MG tablet Take 1 tablet (4 mg total) by mouth every 8 (eight) hours as needed for muscle spasms. 08/23/20   Eulogio Bear, NP  triamcinolone cream (KENALOG) 0.1 % Apply 1 application topically 2 (two) times daily. 02/20/21   Zigmund Gottron, NP  cetirizine (ZYRTEC) 10  MG tablet Take 1 tablet (10 mg total) by mouth daily. 02/20/21 02/20/21  Zigmund Gottron, NP  traZODone (DESYREL) 50 MG tablet Take 1 tablet (50 mg total) by mouth at bedtime. 01/05/20 07/01/20  Venita Lick, NP    Family History Family History  Problem Relation Age of Onset  . Lung cancer Mother   . Diabetes Mother   . Breast cancer Mother 38  . Hypertension Father   . Cancer Maternal Aunt   . Ovarian cancer Maternal Aunt   . Diabetes Maternal Aunt   . Diabetes Maternal Uncle   . Hypertension Maternal Grandmother   . Diabetes Maternal Grandmother     Social History Social History   Tobacco Use  . Smoking status: Current Every Day Smoker    Packs/day: 1.00    Years: 34.00    Pack years: 34.00    Types: Cigarettes  . Smokeless tobacco: Never Used  . Tobacco comment: 2-3 cig/day  Vaping Use  . Vaping Use: Never used  Substance Use Topics  . Alcohol use: Yes    Comment: Occ  . Drug use: Not Currently     Allergies   Azithromycin, Penicillins, Shellfish allergy, and Cymbalta [duloxetine hcl]   Review of Systems Review of Systems  Constitutional: Negative for appetite change, chills, diaphoresis, fatigue and fever.  HENT: Positive for congestion. Negative for ear pain, postnasal drip, rhinorrhea, sinus pressure, sinus pain, sneezing and sore throat.   Eyes: Negative for pain.  Respiratory: Positive for cough, chest tightness and shortness of breath. Negative for  wheezing and stridor.   Cardiovascular: Positive for chest pain. Negative for palpitations and leg swelling.  Gastrointestinal: Negative for abdominal pain, diarrhea and nausea.  Genitourinary: Negative for dysuria.  Musculoskeletal: Negative for arthralgias, back pain, myalgias, neck pain and neck stiffness.  Skin: Negative for color change, pallor, rash and wound.  Neurological: Negative for dizziness, syncope, light-headedness, numbness and headaches.  Psychiatric/Behavioral: Positive for agitation. The patient is nervous/anxious.      Physical Exam Triage Vital Signs ED Triage Vitals  Enc Vitals Group     BP 03/06/21 0832 (!) 171/98     Pulse Rate 03/06/21 0832 91     Resp 03/06/21 0832 (!) 24     Temp 03/06/21 0832 97.9 F (36.6 C)     Temp Source 03/06/21 0832 Oral     SpO2 03/06/21 0832 95 %     Weight 03/06/21 0833 167 lb 1.7 oz (75.8 kg)     Height 03/06/21 0833 5' 3"  (1.6 m)     Head Circumference --      Peak Flow --      Pain Score 03/06/21 0832 10     Pain Loc --      Pain Edu? --      Excl. in [place]? --    No data found.  Updated Vital Signs BP (!) 171/98 (BP Location: Right Arm)   Pulse 91   Temp 97.9 F (36.6 C) (Oral)   Resp (!) 24   Ht 5' 3"  (1.6 m)   Wt 75.8 kg   SpO2 95%   BMI 29.60 kg/m   Visual Acuity Right Eye Distance:   Left Eye Distance:   Bilateral Distance:    Right Eye Near:   Left Eye Near:    Bilateral Near:     Physical Exam Vitals and nursing note reviewed.  Constitutional:      General: She is not in acute distress.  Appearance: She is well-developed. She is not ill-appearing, toxic-appearing or diaphoretic.  HENT:     Head: Normocephalic.  Eyes:     Extraocular Movements: Extraocular movements intact.     Pupils: Pupils are equal, round, and reactive to light.  Neck:     Thyroid: No thyromegaly.     Vascular: No hepatojugular reflux or JVD.     Trachea: No tracheal deviation.  Cardiovascular:     Rate and Rhythm:  Normal rate and regular rhythm.  No extrasystoles are present.    Chest Wall: PMI is not displaced.     Pulses:          Carotid pulses are 2+ on the right side and 2+ on the left side.      Radial pulses are 2+ on the right side and 2+ on the left side.       Dorsalis pedis pulses are 2+ on the right side and 2+ on the left side.       Posterior tibial pulses are 2+ on the right side and 2+ on the left side.     Heart sounds: Normal heart sounds. Heart sounds not distant. No murmur heard. No friction rub. No gallop. No S3 or S4 sounds.   Pulmonary:     Breath sounds: No decreased breath sounds, wheezing, rhonchi or rales.     Comments: Patient takes shallow breaths secondary to her pain.  No  rhonchi rubs or wheezes appreciated Chest:     Chest wall: Tenderness present. No deformity or crepitus.     Comments: She is tender to palpation over the left side of the rib cage.  There is no crepitus. Musculoskeletal:     Cervical back: Normal range of motion and neck supple.  Lymphadenopathy:     Cervical: No cervical adenopathy.  Skin:    General: Skin is warm and dry.     Capillary Refill: Capillary refill takes less than 2 seconds.     Findings: No ecchymosis, erythema or rash.  Neurological:     General: No focal deficit present.     Mental Status: She is alert.  Psychiatric:        Mood and Affect: Mood is anxious. Affect is tearful.        Behavior: Behavior is agitated.      UC Treatments / Results  Labs (all labs ordered are listed, but only abnormal results are displayed) Labs Reviewed  SARS CORONAVIRUS 2 (TAT 6-24 HRS)    EKG Twelve-lead EKG performed in the urgent care today, Mar 06, 2021 at 8:32 AM shows normal sinus rhythm with a ventricular rate of 88 bpm.  PR interval was 126 ms.  QRS duration 66 ms.  QT and corrected QT at 392 and 474 ms.  There is some mild LVH.  No acute ST or T wave changes.  Of note the EKG was not optimal as the patient was moving at times and  multiple attempts were made to have her stop moving.  Radiology DG Chest 2 View  Result Date: 03/06/2021 CLINICAL DATA:  Acute onset of chest pain that began last night with associated shortness of breath. Acute cough that began this morning. EXAM: CHEST - 2 VIEW COMPARISON:  11/25/2020 and earlier, including CTA chest 04/29/2020. FINDINGS: Cardiac silhouette mildly enlarged, unchanged. Hilar and mediastinal contours otherwise unremarkable. Linear scarring in the lingula, LEFT LOWER LOBE and RIGHT LOWER LOBE, unchanged. Lungs clear. Bronchovascular markings normal. Pulmonary vascularity normal. No visible pleural effusions.  No pneumothorax. Visualized bony thorax unremarkable. IMPRESSION: 1.  No acute cardiopulmonary disease. 2. Stable mild cardiomegaly.  Stable scarring in both lungs. Electronically Signed   By: Evangeline Dakin M.D.   On: 03/06/2021 09:55    Procedures Procedures (including critical care time)  Medications Ordered in UC Medications - No data to display  Initial Impression / Assessment and Plan / UC Course  I have reviewed the triage vital signs and the nursing notes.  Pertinent labs & imaging results that were available during my care of the patient were reviewed by me and considered in my medical decision making (see chart for details).  Clinical impression: Acute onset of left-sided chest pain without injury.  She is tender to palpation over the ribs on the left side.  Seems consistent with costochondritis.  That said, cannot fully rule out a pulmonary issue given her asthma, COPD, and emphysema or a cardiac issue.  However, chest pain seems atypical.  Treatment plan: 1.  The findings and treatment plan were discussed in detail with the patient.  Patient was in agreement. 2.  Obtain an EKG.  No acute ST or T wave changes.  Results are above. 3.  Obtain a chest x-ray.  My independent review reveals no infiltrates.  No evidence of pneumothorax.  There is some chronic  scarring.  No acute cardiopulmonary process.  There is a little bit of hypertrophy of the heart.  We will await radiology overread and call the patient if the results are different from my interpretation. 4.  I discussed the findings with the patient in detail.  I gave her the option of going home with anti-inflammatory medications for presumed inflammatory response and costochondritis versus going to the emergency room.  Initially she wanted to go to the emergency room via ambulance but then she decided that the wait was too long and that she would just go home with anti-inflammatories and call her primary care provider to see if she can get an outpatient CT scan of her chest.  While I was concerned about letting her go home the choice was hers and I wanted to respect that.  I did want her to go to the ER be evaluated and potentially get a scan to rule out a cardiopulmonary issue within her chest.  Again she declined on that. 5.  I advised her that if her symptoms were to worsen she does need to go to the ER.  She can also call 911. 6.  Medication was sent into her pharmacy. 7.  She will follow-up with her primary care provider. 8.  She was stable on discharge and she will follow-up here as needed.    Final Clinical Impressions(s) / UC Diagnoses   Final diagnoses:  Atypical chest pain  Costochondritis  COPD with asthma ([place])  Tearfulness  Anxiety state     Discharge Instructions     As we discussed, your EKG does not show any acute cardiac issues at this time.  Your chest x-ray does not show any change from prior x-rays with no pneumonia or collapse of your lung. As we discussed, I recommended you going to the ER to have a higher level of care and potentially a scan of your chest.  You declined on that and wanted go home with anti-inflammatory medicines and call your primary care physician and request that this can be done in the outpatient setting.  Again this is your choice however I would  recommend that if your symptoms persist  or worsen that you call 911 and go to the emergency room.    ED Prescriptions    None     PDMP not reviewed this encounter.   Verda Cumins, MD 03/06/21 1020

## 2021-03-06 NOTE — ED Triage Notes (Signed)
Pt c/o chest pain. Started last night. She took ASA without relief. She states pain is worse when she breathes and talks. She states she is also short of breath, and cough started this morning. Pt tearful during triage. No h/o heart disease.

## 2021-03-07 LAB — SARS CORONAVIRUS 2 (TAT 6-24 HRS): SARS Coronavirus 2: NEGATIVE

## 2021-03-08 ENCOUNTER — Telehealth: Payer: Medicare Other | Admitting: General Practice

## 2021-03-08 ENCOUNTER — Ambulatory Visit (INDEPENDENT_AMBULATORY_CARE_PROVIDER_SITE_OTHER): Payer: Medicare Other | Admitting: General Practice

## 2021-03-08 DIAGNOSIS — M797 Fibromyalgia: Secondary | ICD-10-CM

## 2021-03-08 DIAGNOSIS — F320 Major depressive disorder, single episode, mild: Secondary | ICD-10-CM

## 2021-03-08 DIAGNOSIS — I10 Essential (primary) hypertension: Secondary | ICD-10-CM | POA: Diagnosis not present

## 2021-03-08 DIAGNOSIS — M0579 Rheumatoid arthritis with rheumatoid factor of multiple sites without organ or systems involvement: Secondary | ICD-10-CM

## 2021-03-08 DIAGNOSIS — N644 Mastodynia: Secondary | ICD-10-CM

## 2021-03-08 NOTE — Patient Instructions (Signed)
Visit Information  PATIENT GOALS: Goals Addressed              This Visit's Progress   .  RNCM: Cope with Chronic Pain        Timeframe:  Short-Term Goal Priority:  High Start Date:                             Expected End Date:    06-21-2021                   Follow Up Date 04-08-2021    - learn how to meditate - practice acceptance of chronic pain - spend time with positive people - tell myself I can (not I can't) - think of new ways to do favorite things    Why is this important?    Stress makes chronic pain feel worse.   Feelings like depression, anxiety, stress and anger can make your body more sensitive to pain.   Learning ways to cope with stress or depression may help you find some relief from the pain.         Marland Kitchen  RNCM: Manage Chronic Pain        Timeframe:  Short-Term Goal Priority:  High Start Date:                             Expected End Date:         06-21-2021              Follow Up Date 04-08-2021 - call for medicine refill 2 or 3 days before it runs out - develop a personal pain management plan - keep track of prescription refills - plan exercise or activity when pain is best controlled - prioritize tasks for the day - track times pain is worst and when it is best - track what makes the pain worse and what makes it better - work slower and less intense when having pain    Why is this important?    Day-to-day life can be hard when you have chronic pain.   Pain medicine is just one piece of the treatment puzzle.   You can try these action steps to help you manage your pain.    Notes: Appointment with the pcp on 03-15-2021    .  COMPLETED: RNCM: Pt- "My blood pressure is always high" (pt-stated)        CARE PLAN ENTRY (see longtitudinal plan of care for additional care plan information)  Current Barriers: Closing this goal and opening in new ELS . Chronic Disease Management support, education, and care coordination needs related to HTN and  COPD  Clinical Goal(s) related to HTN and COPD:  Over the next 120 days, patient will:  . Work with the care management team to address educational, disease management, and care coordination needs  . Begin or continue self health monitoring activities as directed today Measure and record blood pressure 3/4 times per week and adhere to a heart healthy diet . Call provider office for new or worsened signs and symptoms Blood pressure findings outside established parameters, Chest pain, Shortness of breath, and New or worsened symptom related to COPD/HTN or other chronic conditions . Call care management team with questions or concerns . Verbalize basic understanding of patient centered plan of care established today  Interventions related to HTN and COPD:  . Evaluation of  current treatment plans and patient's adherence to plan as established by provider.  The patient admits she does not always take her medications as prescribed. Education on the risk of heart attack and stroke with elevated blood pressure. The patient states she is trying to make it a priority to take her medications as prescribed.  . Assessed patient understanding of disease states.  The patient states she understands her conditions but she deals with chronic pain and discomfort and she does not always take care of her self the way she should . Assessed patient's education and care coordination needs.  Education on taking medications as directed, utilization of a pill box and reminders. Discussed taking blood pressure and recording. Education on following a heart healthy diet. 10-19-2020: The patient states she is doing okay. Recovering from an URI. Does not have COVID, Is feeling better each day.  . Provided disease specific education to patient. Review of the DASH diet and monitoring blood pressures. 10-19-2020: The patient did not have readings for the [facility] today . Collaborated with appropriate clinical care team members regarding  patient needs.  Has referrals in place with LCSW and pharmacist.   Patient Self Care Activities related to HTN and COPD:  . Patient is unable to independently self-manage chronic health conditions  Please see past updates related to this goal by clicking on the "Past Updates" button in the selected goal      .  COMPLETED: RNCM: Pt-"I have extreme pain everyday" (pt-stated)        CARE PLAN ENTRY (see longitudinal plan of care for additional care plan information)  Current Barriers: Closing this goal and opening in new ELS . Knowledge Deficits related to chronic pain, RA,  and fibromyalgia and how to effectively manage . Care Coordination needs related to food resources, transportation, and help with utilities  in a patient with Chronic pain, RA and fibromyalgia . Chronic Disease Management support and education needs related to uncontrolled chronic pain, RA, and fibromyalgia . Lacks caregiver support. Home alone during the day . Transportation barriers . Non-adherence to scheduled provider appointments . Non-adherence to prescribed medication regimen  Nurse Case Manager Clinical Goal(s):  Marland Kitchen Over the next 120 days, patient will verbalize understanding of plan for management of uncontrolled pain and discomfort . Over the next 120 days, patient will work with [facility], [place] team, specialist and pcp to address needs related to management of chronic pain, fibromyalgia and RA . Over the next 120 days, patient will demonstrate a decrease in pain exacerbations as evidenced by treatment regimen including RX and therapy to help patient with pain relief of chronic conditions  . Over the next 120 days, patient will attend all scheduled medical appointments: next appointment with the pcp on 09-13-2020.  Marland Kitchen Over the next 120 days, patient will demonstrate improved adherence to prescribed treatment plan for pain management  as evidenced bydecreased level of pain and discomfort and effective management of pain  for chronic pain, RA, and fibromyalgia . Over the next 120 days, patient will work with CM team pharmacist to reconcile medications and help with recommendations for controlling pain . Over the next 120 days, patient will work with CM clinical social worker to depression and anxiety related to dealing with chronic pain and discomfort . Over the next 120 days, patient will work with care guides  (community agency) to help with food resources, transportation and help with utilities . Over the next 120 days, the patient will demonstrate ongoing self health care  management ability as evidenced by taking medications as prescribed, working with physical therapy and CCM team to meet her health and wellness goals.   Interventions:  . Inter-disciplinary care team collaboration (see longitudinal plan of care) . Evaluation of current treatment plan related to chronic pain, fibromyalgia and RA and patient's adherence to plan as established by provider. . Advised patient to to call the pcp or specialist for increased uncontrolled pain and discomfort.  . Provided education to patient re: alternative pain relief methods. The patient was tearful on the call and states that she lives with pain daily. Some days are better than others but she is debilitated by her pain and discomfort. Education on starting therapy soon and the patient is hopeful this will help her. 10-19-2020: The patient was having a good day today. The patient denies any acute issues with her pain today.  . Reviewed medications with patient and discussed compliance. The patient admits she does not always take her medication as prescribed. Discussed compliance and following recommendations of her providers . Collaborated with CCM team and pcp regarding tearful encounter and the patients stated pain and discomfort on a daily basis . Discussed plans with patient for ongoing care management follow up and provided patient with direct contact information for  care management team . Reviewed scheduled/upcoming provider appointments including: pcp 11-30-2020 . Care Guide referral for food resources, transportation resources, and utility assistance . Social Work referral for emotional support, depression, anxiety, therapy in dealing with chronic pain and discomfort.  Currently working with [place] referral for medication reconciliation and recommendations. Appointment 09-10-2020  Patient Self Care Activities:  . Patient verbalizes understanding of plan to work with the CCM team, pcp, and specialist to manage chronic pain, fibromyalgia, and RA . Self administers medications as prescribed . Attends all scheduled provider appointments . Calls pharmacy for medication refills . Performs IADL's independently . Calls provider office for new concerns or questions . Unable to independently manage pain and discomfort as evidence of uncontrolled pain, tearful on the outreach call today . Does not adhere to provider recommendations re:  Marland Kitchen Does not attend all scheduled provider appointments . Does not adhere to prescribed medication regimen . Lacks social connections  Please see past updates related to this goal by clicking on the "Past Updates" button in the selected goal         Patient verbalizes understanding of instructions provided today and agrees to view in [place].   Telephone follow up appointment with care management team member scheduled for:04-08-2021 at 11:45 am  [place], MSN, [facility] Mobile: [phone]

## 2021-03-08 NOTE — Chronic Care Management (AMB) (Signed)
Chronic Care Management   CCM RN Visit Note  03/08/2021 Name: Catherine Giles MRN: [id number] DOB: 04-23-1971  Subjective: Catherine Giles is a 50 y.o. year old female who is a primary care patient of Cannady, Barbaraann Faster, NP. The care management team was consulted for assistance with disease management and care coordination needs.    Engaged with patient by telephone for follow up visit in response to provider referral for case management and/or care coordination services.   Consent to Services:  The patient was given information about Chronic Care Management services, agreed to services, and gave verbal consent prior to initiation of services.  Please see initial visit note for detailed documentation.   Patient agreed to services and verbal consent obtained.   Assessment: Review of patient past medical history, allergies, medications, health status, including review of consultants reports, laboratory and other test data, was performed as part of comprehensive evaluation and provision of chronic care management services.   SDOH (Social Determinants of Health) assessments and interventions performed:  SDOH Interventions   Flowsheet Row Most Recent Value  SDOH Interventions   Depression Interventions/Treatment  --  Aldean Ast calling to get an appointment with the pcp asap]       CCM Care Plan  Allergies  Allergen Reactions  . Azithromycin Anaphylaxis  . Penicillins     Did it involve swelling of the face/tongue/throat, SOB, or low BP? Yes Did it involve sudden or severe rash/hives, skin peeling, or any reaction on the inside of your mouth or nose? No Did you need to seek medical attention at a hospital or doctor's office? No When did it last happen? If all above answers are "NO", may proceed with cephalosporin use.   . Shellfish Allergy   . Cymbalta [Duloxetine Hcl] Rash    Outpatient Encounter Medications as of 03/08/2021  Medication Sig  . gabapentin (NEURONTIN) 100 MG capsule  Take by mouth.  Marland Kitchen albuterol (VENTOLIN HFA) 108 (90 Base) MCG/ACT inhaler Inhale 2 puffs into the lungs every 4 (four) hours as needed.   Marland Kitchen amLODipine (NORVASC) 5 MG tablet Take 1 tablet (5 mg total) by mouth daily.  . Blood Glucose Monitoring Suppl (ONETOUCH VERIO) w/Device KIT Use to check blood sugar 3 times daily with goal <130 fasting and <180 two hours after meal.  Bring meter to visits.  . Cholecalciferol 1.25 MG (50000 UT) TABS Take 1 tablet by mouth once a week. For 8 weeks and then stop.  Return to office for lab draw.  . cyproheptadine (PERIACTIN) 4 MG tablet Take 1 tablet (4 mg total) by mouth at bedtime.  . DULoxetine (CYMBALTA) 30 MG capsule Start out with 30 mg (one tablet) once daily by mouth for 1 week, then increase to 60 mg (two tablets) once daily by mouth. (Patient not taking: Reported on 03/08/2021)  . fexofenadine (ALLEGRA ALLERGY) 180 MG tablet Take 1 tablet (180 mg total) by mouth daily.  . fluticasone (FLONASE) 50 MCG/ACT nasal spray Place 2 sprays into both nostrils daily.  . Fluticasone-Umeclidin-Vilant (TRELEGY ELLIPTA) 100-62.5-25 MCG/INH AEPB Inhale 1 puff into the lungs daily.  Marland Kitchen glucose blood test strip Use to check blood sugar 3 times daily with goal <130 fasting and <180 two hours after meal.  . ibuprofen (ADVIL) 400 MG tablet Take by mouth.  Marland Kitchen ipratropium-albuterol (DUONEB) 0.5-2.5 (3) MG/3ML SOLN Take 3 mLs by nebulization every 6 (six) hours as needed for up to 7 days.  . Lactobacillus PACK Take 1 each by mouth as needed (for  diarrhea.).  Marland Kitchen Lancets (ONETOUCH ULTRASOFT) lancets Use to check blood sugar 3 times daily with goal <130 fasting and <180 two hours after meal.  . leflunomide (ARAVA) 20 MG tablet Take 20 mg by mouth daily.  Marland Kitchen levothyroxine (SYNTHROID) 50 MCG tablet Take 50 mcg by mouth daily before breakfast.   . liothyronine (CYTOMEL) 5 MCG tablet Take 5 mcg by mouth daily.  Marland Kitchen losartan (COZAAR) 100 MG tablet Take 100 mg by mouth daily.   . metFORMIN  (GLUCOPHAGE) 500 MG tablet Take 1 tablet (500 mg total) by mouth 2 (two) times daily with a meal.  . montelukast (SINGULAIR) 10 MG tablet Take 10 mg by mouth at bedtime.  . nicotine (NICOTINE STEP 1) 21 mg/24hr patch Place 1 patch (21 mg total) onto the skin daily.  Marland Kitchen tiZANidine (ZANAFLEX) 4 MG tablet Take 1 tablet (4 mg total) by mouth every 8 (eight) hours as needed for muscle spasms.  . traMADol (ULTRAM) 50 MG tablet Take 50 mg by mouth every 6 (six) hours as needed.   . triamcinolone cream (KENALOG) 0.1 % Apply 1 application topically 2 (two) times daily.  . [DISCONTINUED] cetirizine (ZYRTEC) 10 MG tablet Take 1 tablet (10 mg total) by mouth daily.  . [DISCONTINUED] traZODone (DESYREL) 50 MG tablet Take 1 tablet (50 mg total) by mouth at bedtime.   No facility-administered encounter medications on file as of 03/08/2021.    Patient Active Problem List   Diagnosis Date Noted  . Depression, major, single episode, mild ([place]) 01/24/2021  . Memory changes 10/12/2020  . Dairy allergy 10/12/2020  . Headache 10/12/2020  . Vaginal stenosis 03/19/2020  . Obesity 03/19/2020  . Insomnia 01/05/2020  . Complex regional pain syndrome I of upper limb 12/17/2019  . Hypertension associated with diabetes ([place]) 12/13/2019  . Centrilobular emphysema ([place]) 12/13/2019  . Type 2 diabetes mellitus with obesity ([place]) 12/13/2019  . Hypothyroid 12/13/2019  . DDD (degenerative disc disease), lumbar 08/26/2019  . Multinodular goiter 08/14/2019  . Chronic left hip pain 05/05/2019  . Osteoarthritis of spine with radiculopathy, cervical region 05/05/2019  . Fibromyalgia 02/10/2019  . Rheumatoid arthritis involving multiple sites with positive rheumatoid factor ([place]) 02/10/2019  . Undifferentiated connective tissue disease ([place]) 02/10/2019  . Cervical radiculopathy 03/13/2018  . Vitamin D deficiency 10/11/2015    Conditions to be addressed/monitored:HTN, Depression and Chronic pain: RA, left chest pain, and  fibromyalgia  Care Plan : RNCM: Depression (Adult)  Updates made by Vanita Ingles since 03/08/2021 12:00 AM    Problem: RNCM: Harm or Injury (Depression)   Priority: High    Goal: RNCM: Harm or Injury Prevented- depression   Priority: High  Note:   Current Barriers:  Marland Kitchen Knowledge Deficits related to resources for effective management of depression, depression worse due to severe pain and unable to take medication that was prescribed for depression . Care Coordination needs related to effective management of depression exacerbation  in a patient with severe pain causing depression symptoms to be exacerbated . Chronic Disease Management support and education needs related to depression with a patient with multiple chronic conditions including sever pain . Lacks caregiver support.  . Non-adherence to scheduled provider appointments . Non-adherence to prescribed medication regimen . Unable to independently manage depression due to severe pain and unable to take Cymbalta due to side effect of rash. Stopped taking the medication but did not notify the provider.  . Unable to self administer medications as prescribed . Does not attend all scheduled provider  appointments . Does not adhere to prescribed medication regimen . Lacks social connections . Does not contact provider office for questions/concerns  Nurse Case Manager Clinical Goal(s):  . patient will verbalize understanding of plan for effective management of depression  . patient will work with [facility] [place] team and pcp to address needs related to effective management of depression . patient will demonstrate a decrease in depression  exacerbations as evidenced by finding medication to help with depression, working with CCM team to optimize mental health and well being.  . patient will attend all scheduled medical appointments: staff message sent requesting follow up with pcp ASAP for evaluation and recommendations for depression exacerbation.   . patient will experience decrease in ED visits. ED visits in last 6 months = 6, 1 actuall ED, 5 urgent care . patient will work with CM team pharmacist to assist with medication side effects, education, and reconciliation  . patient will work with CM clinical social worker to worsening depression due to uncontrolled pain and discomfort.   Interventions:  . 1:1 collaboration with Venita Lick, NP regarding development and update of comprehensive plan of care as evidenced by provider attestation and co-signature . Inter-disciplinary care team collaboration (see longitudinal plan of care) . Evaluation of current treatment plan related to depression  and patient's adherence to plan as established by provider. The patient is currently having an exacerbation of her depression and was very tearful. Empathetic listening and support given. Encouraged the patient to reach out for help if she felt hopeless or had thoughts of hurting self.  . Advised patient to call the office for changes or questions, to be available when the office calls to make appointment with pcp (appointment secured for 03-15-2021 at 0940 am) to seek emergent help for suicidal ideation or thoughts of hurting self or others.  . Provided education to patient re: the need to find a medication that would work well for her depression that does not cause side effects. The patient stopped taking the Cymbalta due to a rash. She says her depression is worse due to the severe pain and discomfort she is experiencing.  . Reviewed medications with patient and discussed compliance. The patient is not taking any medications for depression at this time.  Marland Kitchen Collaborated with pcp, CCM team  regarding exacerbation of depression and the need to see pcp for evaluation and treatment . Reviewed scheduled/upcoming provider appointments including: 03-15-2021 at [employer] am . Social Work referral for exacerbation of depression . Pharmacy referral for medication  side effects and medication reconciliation. . Discussed plans with patient for ongoing care management follow up and provided patient with direct contact information for care management team  Patient Goals/Self-Care Activities Over the next 120 days, patient will:  - Patient will self administer medications as prescribed Patient will attend all scheduled provider appointments Patient will call pharmacy for medication refills Patient will attend church or other social activities Patient will continue to perform ADL's independently Patient will continue to perform IADL's independently Patient will call provider office for new concerns or questions Patient will work with BSW to address care coordination needs and will continue to work with the clinical team to address health care and disease management related needs.   Seek emergent care for suicidal ideation or thoughts of harming self or others - action plan for worsening symptoms mutually developed - barriers to safety identified and addressed - emergency contact information provided - family/caregiver monitoring of patient's emotional state encouraged - modification of  home and work environment promoted - Press photographer developed - provision of safe home environment by family/caregiver encouraged - risk of self-injury assessed - side effects of medication monitored - suicidal or self-injurious tendencies explored  Follow Up Plan: Telephone follow up appointment with care management team member scheduled for:  04-08-2021 at 11:45 am      Task: RNCM: Depression   Note:   Care Management Activities:    - action plan for worsening symptoms mutually developed - barriers to safety identified and addressed - emergency contact information provided - family/caregiver monitoring of patient's emotional state encouraged - modification of home and work environment promoted - Nurse, children's plan developed - provision of safe home  environment by family/caregiver encouraged - risk of self-injury assessed - side effects of medication monitored - suicidal or self-injurious tendencies explored       Care Plan : RNCM: Chronic Pain (Adult)  Updates made by Vanita Ingles since 03/08/2021 12:00 AM    Problem: RNCM: Pain Management Plan (Chronic Pain) left chest pain, RA, Fibromyalgia   Priority: High    Long-Range Goal: RNCM: Pain Management Plan Developed   Priority: High  Note:   Current Barriers:  Marland Kitchen Knowledge Deficits related to managing acute/chronic pain . Non-adherence to scheduled provider appointments . Non-adherence to prescribed medication regimen . Difficulty obtaining medications . Chronic Disease Management support and education needs related to chronic pain . Unable to independently manage pain and discomfort as evidence of pain level at >10 today . Unable to self administer medications as prescribed . Does not attend all scheduled provider appointments . Does not adhere to prescribed medication regimen . Lacks social connections . Does not contact provider office for questions/concerns Nurse Case Manager Clinical Goal(s):  . patient will verbalize understanding of plan for managing pain . patient will talk with RN Care Manager to address new concerns of pain and discomfort and help with exacerbation of pain and discomfort  . patient will attend all scheduled medical appointments: 03-15-2021 at 0940 am . patient will demonstrate use of different relaxation  skills and/or diversional activities to assist with pain reduction (distraction, imagery, relaxation, massage, acupressure, TENS, heat, and cold application . patient will report pain at a level less than 3 to 4 on a 10-10 rating scale . patient will use pharmacological and nonpharmacological pain relief strategies . patient will verbalize acceptable level of pain relief and ability to engage in desired activities . patient will engage in desired  activities without an increase in pain level Interventions:  . Collaboration with Venita Lick, NP regarding development and update of comprehensive plan of care as evidenced by provider attestation and co-signature . Inter-disciplinary care team collaboration (see longitudinal plan of care) . - deep breathing, relaxation and mindfulness use promoted . - effectiveness of pharmacologic therapy monitored . - medication-induced side effects managed . - misuse of pain medication assessed . - motivation and barriers to change assessed and addressed . - mutually acceptable comfort goal set . - pain assessed . - pain treatment goals reviewed . - premedication prior to activity encouraged . Evaluation of current treatment plan related to pain: RA, fibromyalgia, and left chest pain (patient seen in ER and cardiac issues ruled out) and patient's adherence to plan as established by provider. . Advised patient to call the office for worsening level or intensity of pain and to seek emergent care for unresolved pain and discomfort  . Provided education to patient re: safety concerns, working with  the CCM team and pcp, alternative pain relief methods.  . Reviewed medications with patient and discussed compliance, the patient is supposed to start infusions soon for her RA. She saw specialist last week and she is waiting on an appointment for infusion.  Marland Kitchen Collaborated with pcp, CCM team regarding Chronic pain and discomfort  . Discussed plans with patient for ongoing care management follow up and provided patient with direct contact information for care management team . Allow patient to maintain a diary of pain ratings, timing, precipitating events, medications, treatments, and what works best to relieve pain,  . Refer to support groups and self-help groups . Educate patient about the use of pharmacological interventions for pain management- antianxiety, antidepressants, NSAIDS, opioid analgesics,   . Explain the importance of lifestyle modifications to effective pain management  Patient Goals/Self Care Activities:  . - mutually acceptable comfort goal set . - pain assessed . - pain management plan developed . - pain treatment goals reviewed . - patient response to treatment assessed . - sharing of pain management plan with teachers and other caregivers encouraged . Self-administers medications as prescribed . Attends all scheduled provider appointments . Calls pharmacy for medication refills . Calls provider office for new concerns or questions Follow Up Plan: Telephone follow up appointment with care management team member scheduled for: 04-08-2021 at  11:45 am   Timeframe:  Short-Term Goal Priority:  High Start Date:                             Expected End Date:         06-21-2021              Follow Up Date 04-08-2021 - call for medicine refill 2 or 3 days before it runs out - develop a personal pain management plan - keep track of prescription refills - plan exercise or activity when pain is best controlled - prioritize tasks for the day - track times pain is worst and when it is best - track what makes the pain worse and what makes it better - work slower and less intense when having pain    Why is this important?    Day-to-day life can be hard when you have chronic pain.   Pain medicine is just one piece of the treatment puzzle.   You can try these action steps to help you manage your pain.    Notes: Appointment with the pcp on 03-15-2021  Timeframe:  Short-Term Goal Priority:  High Start Date:                             Expected End Date:    06-21-2021                   Follow Up Date 04-08-2021    - learn how to meditate - practice acceptance of chronic pain - spend time with positive people - tell myself I can (not I can't) - think of new ways to do favorite things    Why is this important?    Stress makes chronic pain feel worse.   Feelings like  depression, anxiety, stress and anger can make your body more sensitive to pain.   Learning ways to cope with stress or depression may help you find some relief from the pain.        Task: RNCM: Partner to Develop  Chronic Pain Management Plan   Note:   Care Management Activities:    - mutually acceptable comfort goal set - pain assessed - pain management plan developed - pain treatment goals reviewed - patient response to treatment assessed - sharing of pain management plan with teachers and other caregivers encouraged       Care Plan : RNCM: Hypertension (Adult)  Updates made by Vanita Ingles since 03/08/2021 12:00 AM    Problem: RNCM: Hypertension (Hypertension)   Priority: Medium    Long-Range Goal: RNCM: Hypertension Monitored   Priority: Medium  Note:   Objective:  . Last practice recorded BP readings:  BP Readings from Last 3 Encounters:  03/06/21 (!) 171/98  02/20/21 (!) 145/90  01/25/21 (!) 142/86 .   Marland Kitchen Most recent eGFR/CrCl:  Lab Results  Component Value Date   EGFR 106 01/24/2021 .    No components found for: CRCL Current Barriers:  Marland Kitchen Knowledge Deficits related to basic understanding of hypertension pathophysiology and self care management . Knowledge Deficits related to understanding of medications prescribed for management of hypertension . Non-adherence to prescribed medication regimen . Non-adherence to scheduled provider appointments . Limited Social Support . Unable to independently manage HTN . Does not adhere to prescribed medication regimen . Lacks social connections . Does not contact provider office for questions/concerns Case Manager Clinical Goal(s):  . patient will verbalize understanding of plan for hypertension management . patient will attend all scheduled medical appointments: 03-15-2021 . patient will demonstrate improved adherence to prescribed treatment plan for hypertension as evidenced by taking all medications as prescribed,  monitoring and recording blood pressure as directed, adhering to low sodium/DASH diet . patient will demonstrate improved health management independence as evidenced by checking blood pressure as directed and notifying PCP if SBP>150 or DBP > 60, taking all medications as prescribe, and adhering to a low sodium diet as discussed. . patient will verbalize basic understanding of hypertension disease process and self health management plan as evidenced by compliance with heart healthy diet, compliance with medications and working with the CCM team to manage health and well being.  Interventions:  . Collaboration with Venita Lick, NP regarding development and update of comprehensive plan of care as evidenced by provider attestation and co-signature . Inter-disciplinary care team collaboration (see longitudinal plan of care) . UNABLE to independently:manage HTN . Evaluation of current treatment plan related to hypertension self management and patient's adherence to plan as established by provider. . Provided education to patient re: stroke prevention, s/s of heart attack and stroke, DASH diet, complications of uncontrolled blood pressure . Reviewed medications with patient and discussed importance of compliance . Discussed plans with patient for ongoing care management follow up and provided patient with direct contact information for care management team . Advised patient, providing education and rationale, to monitor blood pressure daily and record, calling PCP for findings outside established parameters.  . Reviewed scheduled/upcoming provider appointments including: 03-15-2021 Self-Care Activities: - Self administers medications as prescribed Attends all scheduled provider appointments Calls provider office for new concerns, questions, or BP outside discussed parameters Checks BP and records as discussed Follows a low sodium diet/DASH diet Patient Goals: - check blood pressure daily - choose  a place to take my blood pressure (home, clinic or office, retail store) - write blood pressure results in a log or diary - agree on reward when goals are met - agree to work together to make changes - ask questions to understand - have a  family meeting to talk about healthy habits - learn about high blood pressure - blood pressure trends reviewed - depression screen reviewed - home or ambulatory blood pressure monitoring encouraged Follow Up Plan: Telephone follow up appointment with care management team member scheduled for: 04-08-2021 at 11:45 am    Task: RNCM: Identify and Monitor Blood Pressure Elevation   Note:   Care Management Activities:    - blood pressure trends reviewed - depression screen reviewed - home or ambulatory blood pressure monitoring encouraged         Plan:Telephone follow up appointment with care management team member scheduled for:  04-08-2021 at 1145 am  [place], MSN, [facility] Mobile: [phone]

## 2021-03-09 ENCOUNTER — Encounter: Payer: Self-pay | Admitting: Internal Medicine

## 2021-03-09 ENCOUNTER — Telehealth: Payer: Self-pay

## 2021-03-09 NOTE — Chronic Care Management (AMB) (Signed)
  Chronic Care Management   Note  03/09/2021 Name: Catherine Giles MRN: [id number] DOB: 01-Aug-1971  Catherine Giles is a 50 y.o. year old female who is a primary care patient of Cannady, Barbaraann Faster, NP. Catherine Giles is currently enrolled in care management services. An additional referral for Pharm D  was placed.   Follow up plan: Unsuccessful telephone outreach attempt made. A HIPAA compliant phone message was left for the patient providing contact information and requesting a return call.  The care management team will reach out to the patient again over the next 4 days.  If patient returns call to provider office, please advise to call [facility]  at [address], [place], [place], [place] [address] Direct Dial: [phone] [email or web address]: [email or web address]

## 2021-03-15 ENCOUNTER — Ambulatory Visit: Payer: Medicare Other | Admitting: Internal Medicine

## 2021-03-16 ENCOUNTER — Telehealth: Payer: Self-pay | Admitting: Licensed Clinical Social Worker

## 2021-03-16 ENCOUNTER — Other Ambulatory Visit: Payer: Self-pay

## 2021-03-16 ENCOUNTER — Encounter: Payer: Self-pay | Admitting: Nurse Practitioner

## 2021-03-16 ENCOUNTER — Ambulatory Visit (INDEPENDENT_AMBULATORY_CARE_PROVIDER_SITE_OTHER): Payer: Medicare Other | Admitting: Nurse Practitioner

## 2021-03-16 VITALS — BP 138/85 | HR 91 | Temp 98.2°F | Wt 172.4 lb

## 2021-03-16 DIAGNOSIS — I152 Hypertension secondary to endocrine disorders: Secondary | ICD-10-CM

## 2021-03-16 DIAGNOSIS — E1159 Type 2 diabetes mellitus with other circulatory complications: Secondary | ICD-10-CM

## 2021-03-16 DIAGNOSIS — E669 Obesity, unspecified: Secondary | ICD-10-CM

## 2021-03-16 DIAGNOSIS — M0579 Rheumatoid arthritis with rheumatoid factor of multiple sites without organ or systems involvement: Secondary | ICD-10-CM | POA: Diagnosis not present

## 2021-03-16 DIAGNOSIS — E1169 Type 2 diabetes mellitus with other specified complication: Secondary | ICD-10-CM

## 2021-03-16 DIAGNOSIS — F320 Major depressive disorder, single episode, mild: Secondary | ICD-10-CM | POA: Diagnosis not present

## 2021-03-16 MED ORDER — TRAMADOL HCL 50 MG PO TABS: 50.0000 mg | ORAL_TABLET | Freq: Four times a day (QID) | ORAL | 0 refills | Status: AC | PRN

## 2021-03-16 MED ORDER — DAPAGLIFLOZIN PROPANEDIOL 10 MG PO TABS: 10.0000 mg | ORAL_TABLET | Freq: Every day | ORAL | 4 refills | Status: AC

## 2021-03-16 MED ORDER — GABAPENTIN 100 MG PO CAPS: 100.0000 mg | ORAL_CAPSULE | Freq: Three times a day (TID) | ORAL | 4 refills | Status: DC

## 2021-03-16 MED ORDER — CHOLECALCIFEROL 1.25 MG (50000 UT) PO TABS: 1.0000 | ORAL_TABLET | ORAL | 3 refills | Status: DC

## 2021-03-16 MED ORDER — DULOXETINE HCL 30 MG PO CPEP: ORAL_CAPSULE | ORAL | 4 refills | Status: DC

## 2021-03-16 NOTE — Assessment & Plan Note (Signed)
Chronic, ongoing.  Followed by rheumatology, requested referral to [facility] for second opinion last visit, but has not attended yet.  At this time continue current medications and adjust as needed.  Urgent referral today to pain clinic due to ongoing pain, extremely tearful today in office.  Recommend she start taking Duloxetine and Gabapentin daily + will send in short supply of Tramadol to allow for some relief.  She would like to work, discussed with her obtaining FMLA from workplace.  Return in July and continue to collaborate with rheumatology.

## 2021-03-16 NOTE — Progress Notes (Addendum)
BP 138/85 (BP Location: Left Arm, Cuff Size: Normal)   Pulse 91   Temp 98.2 F (36.8 C) (Oral)   Wt 172 lb 6.4 oz (78.2 kg)   SpO2 95%   BMI 30.54 kg/m    Subjective:    Patient ID: Catherine Giles, female    DOB: May 09, 1971, 50 y.o.   MRN: [id number]  HPI: Catherine Giles is a 50 y.o. female  Chief Complaint  Patient presents with  . Depression  . Neurology Referral    Patient states she would like to discuss having a new referral placed for a new neurologist as she is not happy with the one she has currently.   . Pain    Patient states her all over body pain is starting to take her into a dark place and states all she does is take pills and feels there is no productivity in her current lifestyle due to not being able to do things due to the pain she is experiencing.   DEPRESSION Ordered Duloxetine last visit, which she reports caused big red rashes -- she stopped it and rashes continued -- now wants to go back on Duloxetine.   Discussed her taking this with Gabapentin.  Last visit in April 2022 -- 6.7%.  Is on Prednisone for her RA.  Tried Metformin -- this made her very sick.   Mood status: uncontrolled Satisfied with current treatment?: yes Symptom severity: moderate  Duration of current treatment : chronic Side effects: no Medication compliance: good compliance Psychotherapy/counseling: none Depressed mood: yes Anxious mood: no Anhedonia: no Significant weight loss or gain: no Insomnia: none Fatigue: no Feelings of worthlessness or guilt: no Impaired concentration/indecisiveness: no Suicidal ideations: no Hopelessness: no Crying spells: no Depression screen [facility] 2/9 03/08/2021 01/24/2021 10/12/2020 05/20/2019  Decreased Interest 3 3 0 0  Down, Depressed, Hopeless 3 2 0 1  PHQ - 2 Score 6 5 0 1  Altered sleeping 2 3 3 2   Tired, decreased energy 3 3 3 3   Change in appetite 0 1 2 2   Feeling bad or failure about yourself  0 0 0 0  Trouble concentrating 3 3 2 1   Moving  slowly or fidgety/restless 1 1 2 1   Suicidal thoughts 0 0 0 0  PHQ-9 Score 15 16 12 10   Difficult doing work/chores Somewhat difficult Somewhat difficult Somewhat difficult Very difficult   CHRONIC PAIN WITH RA Followed by rheumatology for RA with last visit 03/03/21 -- continues Arava and Prednisone (started this yesterday, she reports this make her more irritable and bloated) -- is tearful today and reporting constant pain and can not work.  She is not sure when infusions will start, just got approved yesterday.  She reports she is in a lot of pain today in arms and fingers. Took Tramadol in past, but has none left and on PDMP review has not filled since May 2021.  Sees neurology, Dr. Melrose Nakayama, and reports frustration with him. Pain control status: uncontrolled Duration: chronic Location: arms and hands are the worst -- legs Quality: dull, aching, burning and throbbing Current Pain Level: 10/10 Previous Pain Level: 10/10 What Activities task can be accomplished with current medication? House work at times but can not work Previous pain specialty evaluation: no Non-narcotic analgesic meds: yes  Relevant past medical, surgical, family and social history reviewed and updated as indicated. Interim medical history since our last visit reviewed. Allergies and medications reviewed and updated.  Review of Systems  Constitutional: Positive for fatigue. Negative  for activity change, appetite change, diaphoresis and fever.  Respiratory: Negative for cough, chest tightness and shortness of breath.   Cardiovascular: Negative for chest pain, palpitations and leg swelling.  Gastrointestinal: Negative.   Endocrine: Negative for cold intolerance, heat intolerance, polydipsia, polyphagia and polyuria.  Musculoskeletal: Positive for arthralgias.  Neurological: Negative for dizziness, syncope, weakness, light-headedness, numbness and headaches.  Psychiatric/Behavioral: Positive for decreased concentration and  sleep disturbance. Negative for self-injury and suicidal ideas. The patient is not nervous/anxious.     Per HPI unless specifically indicated above     Objective:    BP 138/85 (BP Location: Left Arm, Cuff Size: Normal)   Pulse 91   Temp 98.2 F (36.8 C) (Oral)   Wt 172 lb 6.4 oz (78.2 kg)   SpO2 95%   BMI 30.54 kg/m   Wt Readings from Last 3 Encounters:  03/16/21 172 lb 6.4 oz (78.2 kg)  03/06/21 167 lb 1.7 oz (75.8 kg)  02/20/21 167 lb (75.8 kg)    Physical Exam Vitals and nursing note reviewed.  Constitutional:      General: She is awake. She is not in acute distress.    Appearance: She is well-developed and well-groomed. She is obese. She is not ill-appearing.  HENT:     Head: Normocephalic.     Right Ear: Hearing normal.     Left Ear: Hearing normal.  Eyes:     General: Lids are normal.        Right eye: No discharge.        Left eye: No discharge.     Conjunctiva/sclera: Conjunctivae normal.     Pupils: Pupils are equal, round, and reactive to light.  Neck:     Thyroid: No thyromegaly.     Vascular: No carotid bruit.  Cardiovascular:     Rate and Rhythm: Normal rate and regular rhythm.     Heart sounds: Normal heart sounds. No murmur heard. No gallop.   Pulmonary:     Effort: Pulmonary effort is normal. No accessory muscle usage or respiratory distress.     Breath sounds: Normal breath sounds.  Abdominal:     General: Bowel sounds are normal.     Palpations: Abdomen is soft.  Musculoskeletal:     Cervical back: Normal range of motion and neck supple.     Right lower leg: No edema.     Left lower leg: No edema.  Lymphadenopathy:     Cervical: No cervical adenopathy.  Skin:    General: Skin is warm and dry.  Neurological:     Mental Status: She is alert and oriented to person, place, and time.  Psychiatric:        Attention and Perception: Attention normal.        Mood and Affect: Affect is tearful.        Speech: Speech normal.        Behavior:  Behavior normal. Behavior is cooperative.        Thought Content: Thought content normal.     Comments: Very tearful affect and grimacing with pain on moving.    Diabetic Foot Exam - Simple   Simple Foot Form Visual Inspection No deformities, no ulcerations, no other skin breakdown bilaterally: Yes Sensation Testing Intact to touch and monofilament testing bilaterally: Yes Pulse Check Posterior Tibialis and Dorsalis pulse intact bilaterally: Yes Comments    Results for orders placed or performed during the hospital encounter of 03/06/21  SARS CORONAVIRUS 2 (TAT 6-24 HRS) Nasopharyngeal Nasopharyngeal  Swab   Specimen: Nasopharyngeal Swab  Result Value Ref Range   SARS Coronavirus 2 NEGATIVE NEGATIVE      Assessment & Plan:   Problem List Items Addressed This Visit      Cardiovascular and Mediastinum   Hypertension associated with diabetes ([place])    Chronic, ongoing.  BP mildly above goal today, but is in pain.  No red flag findings on exam.  Continue Losartan 100 MG daily and Amlodipine 5 MG daily.  Recommend she monitor BP at least a few mornings a week at home and document.  DASH diet at home.  Labs next visit in July.  Return in July.       Relevant Medications   dapagliflozin propanediol (FARXIGA) 10 MG TABS tablet     Endocrine   Type 2 diabetes mellitus with obesity ([place])    New diagnosis in April 2022, with A1c 6.7% and on Prednisone daily for RA.  Did not tolerate Metformin due to GI issues.  Discussed with patient, will trial Farxiga 10 MG daily, educated her on this medication.  Script sent.  To continue to check BS 2-3 times daily and document for visits.  To alert provider if poor tolerance of medication.  Return in July for A1c.      Relevant Medications   dapagliflozin propanediol (FARXIGA) 10 MG TABS tablet     Musculoskeletal and Integument   Rheumatoid arthritis involving multiple sites with positive rheumatoid factor (HCC) - Primary    Chronic, ongoing.   Followed by rheumatology, requested referral to [facility] for second opinion last visit, but has not attended yet.  At this time continue current medications and adjust as needed.  Urgent referral today to pain clinic due to ongoing pain, extremely tearful today in office.  Recommend she start taking Duloxetine and Gabapentin daily + will send in short supply of Tramadol to allow for some relief.  She would like to work, discussed with her obtaining FMLA from workplace.  Return in July and continue to collaborate with rheumatology.      Relevant Medications   predniSONE (DELTASONE) 10 MG tablet   traMADol (ULTRAM) 50 MG tablet   Other Relevant Orders   Ambulatory referral to Pain Clinic     Other   Depression, major, single episode, mild ([place])    Ongoing with PHQ9 = 15 today.  Discussed at length various options for treatment, including no treatment.  Denies SI/HI.  At this timerecommend restart Duloxetine, which would offer benefit to both mood and underlying fibromyalgia and chronic pain.  Script sent and educated patient at length on this.  To return to office in July for follow-up.      Relevant Medications   DULoxetine (CYMBALTA) 30 MG capsule       Follow up plan: Return in about 8 weeks (around 05/11/2021) for T2DM, HTN/HLD, RA, MOOD.

## 2021-03-16 NOTE — Patient Instructions (Signed)
Rheumatoid Arthritis Rheumatoid arthritis (RA) is a long-term (chronic) disease. RA causes inflammation in your joints. Your joints may feel painful, stiff, swollen, and warm. RA may start slowly. It most often affects the small joints of the hands and feet. It can also affect other parts of the body. Symptoms of RA often come and go. There is no cure for RA, but medicines can help your symptoms. What are the causes?  RA is an autoimmune disease. This means that your body's defense system (immune system) attacks healthy parts of your body by mistake. The exact cause of RA is not known. What increases the risk?  Being a woman.  Having a family history of RA or other diseases like RA.  Smoking.  Being overweight.  Being exposed to pollutants or chemicals. What are the signs or symptoms?  Morning stiffness that lasts longer than 30 minutes. This is often the first symptom.  Symptoms start slowly. They are often worse in the morning.  As RA gets worse, symptoms may include: ? Pain, stiffness, swelling, warmth, and tenderness in joints on both sides of your body. ? Loss of energy. ? Not feeling hungry. ? Weight loss. ? A low fever. ? Dry eyes and a dry mouth. ? Firm lumps that grow under your skin. ? Changes in the way your joints look. ? Changes in the way your joints work.  Symptoms vary and they: ? Often come and go. ? Sometimes get worse for a period of time. These are called flares. How is this treated?  Treatment may include: ? Taking good care of yourself. Be sure to rest as needed, eat a healthy diet, and exercise. ? Medicines. These may include:  Pain relievers.  Medicines to help with inflammation.  Disease-modifying antirheumatic drugs (DMARDs).  Medicines called biologic response modifiers. ? Physical therapy and occupational therapy. ? Surgery, if joint damage is very bad. Your doctor will work with you to find the best treatments.   Follow these  instructions at home: Activity  Return to your normal activities as told by your doctor. Ask your doctor what activities are safe for you.  Rest when you have a flare.  Exercise as told by your doctor. General instructions  Take over-the-counter and prescription medicines only as told by your doctor.  Keep all follow-up visits as told by your doctor. This is important. Where to find more information  [employer] of Rheumatology: www.rheumatology.[place]: www.arthritis.org Contact a doctor if:  You have a flare.  You have a fever.  You have problems because of your medicines. Get help right away if:  You have chest pain.  You have trouble breathing.  You get a hot, painful joint all of a sudden, and it is worse than your normal joint aches. Summary  RA is a long-term disease.  Symptoms of RA start slowly. They are often worse in the morning.  RA causes inflammation in your joints. This information is not intended to replace advice given to you by your health care provider. Make sure you discuss any questions you have with your health care provider. Document Revised: 06/12/2018 Document Reviewed: 06/12/2018 Elsevier Patient Education  2021 [employer].

## 2021-03-16 NOTE — Assessment & Plan Note (Signed)
New diagnosis in April 2022, with A1c 6.7% and on Prednisone daily for RA.  Did not tolerate Metformin due to GI issues.  Discussed with patient, will trial Farxiga 10 MG daily, educated her on this medication.  Script sent.  To continue to check BS 2-3 times daily and document for visits.  To alert provider if poor tolerance of medication.  Return in July for A1c.

## 2021-03-16 NOTE — Assessment & Plan Note (Signed)
Chronic, ongoing.  BP mildly above goal today, but is in pain.  No red flag findings on exam.  Continue Losartan 100 MG daily and Amlodipine 5 MG daily.  Recommend she monitor BP at least a few mornings a week at home and document.  DASH diet at home.  Labs next visit in July.  Return in July.

## 2021-03-16 NOTE — Assessment & Plan Note (Signed)
Ongoing with PHQ9 = 15 today.  Discussed at length various options for treatment, including no treatment.  Denies SI/HI.  At this timerecommend restart Duloxetine, which would offer benefit to both mood and underlying fibromyalgia and chronic pain.  Script sent and educated patient at length on this.  To return to office in July for follow-up.

## 2021-03-16 NOTE — Telephone Encounter (Signed)
    Clinical Social Work  Chronic Care Management   Phone Outreach    03/16/2021 Name: Alylah Blakney MRN: [id number] DOB: 10-Aug-1971  Meridee Branum is a 50 y.o. year old female who is a primary care patient of Cannady, Barbaraann Faster, NP .   CCM LCSW reached out to patient today by phone to introduce self, assess needs and offer Care Management services and interventions.    Telephone outreach was unsuccessful  Plan:Will route chart to Care Guide to see if patient would like to schedule a phone appointment   Review of patient status, including review of consultants reports, relevant laboratory and other test results, and collaboration with appropriate care team members and the patient's provider was performed as part of comprehensive patient evaluation and provision of care management services.    Christa See, MSW, [email or web address] Phone [phone] 1:31 PM

## 2021-03-16 NOTE — Chronic Care Management (AMB) (Signed)
  Chronic Care Management   Note  03/16/2021 Name: Catherine Giles MRN: [id number] DOB: 03-Aug-1971  Catherine Giles is a 50 y.o. year old female who is a primary care patient of Cannady, Barbaraann Faster, NP. Catherine Giles is currently enrolled in care management services. An additional referral for Pharm D  was placed.   Follow up plan: Telephone appointment with care management team member scheduled for:03/18/2021  Noreene Larsson, [place], [place], [place] [address] Direct Dial: [phone] [email or web address]: [email or web address]

## 2021-03-17 ENCOUNTER — Telehealth: Payer: Self-pay | Admitting: Licensed Clinical Social Worker

## 2021-03-17 ENCOUNTER — Telehealth: Payer: Medicaid Other

## 2021-03-17 NOTE — Telephone Encounter (Signed)
Patient has been scheduled sooner.

## 2021-03-17 NOTE — Progress Notes (Deleted)
 Chronic Care Management Pharmacy Note  03/17/2021 Name:  Catherine Giles MRN:  [id number] DOB:  04/14/1971  Subjective: Catherine Giles is an 49 y.o. year old female who is a primary patient of Cannady, Jolene T, NP.  The CCM team was consulted for assistance with disease management and care coordination needs.    Engaged with patient by telephone for initial visit in response to provider referral for pharmacy case management and/or care coordination services.   Consent to Services:  The patient was given the following information about Chronic Care Management services today, agreed to services, and gave verbal consent: 1. CCM service includes personalized support from designated clinical staff supervised by the primary care provider, including individualized plan of care and coordination with other care providers 2. 24/7 contact phone numbers for assistance for urgent and routine care needs. 3. Service will only be billed when office clinical staff spend 20 minutes or more in a month to coordinate care. 4. Only one practitioner may furnish and bill the service in a calendar month. 5.The patient may stop CCM services at any time (effective at the end of the month) by phone call to the office staff. 6. The patient will be responsible for cost sharing (co-pay) of up to 20% of the service fee (after annual deductible is met). Patient agreed to services and consent obtained.  Patient Care Team: Cannady, Jolene T, NP as PCP - General (Nurse Practitioner) Joyce, Brooke L, LCSW as [facility] Care Management (Licensed Clinical Social Worker) ,  S, RPH (Pharmacist) Tate, Pamela J, RN as Case Manager (General Practice)  Recent office visits: 03/16/21- Cannady (PCP)- Farxiga, pain clinic referral, duloxetine 02/28/21- [facility] _ Covid test neg, fexofenadine, lactobacillus Recent consult visits: 03/08/21- Patel (rheumatology)- Simponi Aria infusions 100% covered 03/03/21- Patel  (rheumatology)-RA stage 2 moderate, taking prednisone, noncompliant with appts or Cimzia  Failed mtx, self dc plaquenil Hospital visits: 03/06/21- [facility] urgent care-chest pain, costochondritis 02/21/21- [facility] urgent care - RASH- thought to be bug bites, triamcinolone  Objective:  Lab Results  Component Value Date   CREATININE 0.67 01/24/2021   BUN 19 01/24/2021   GFRNONAA 115 10/12/2020   GFRAA 132 10/12/2020   NA 146 (H) 01/24/2021   K 4.3 01/24/2021   CALCIUM 9.5 01/24/2021   CO2 19 (L) 01/24/2021   GLUCOSE 237 (H) 01/24/2021    Lab Results  Component Value Date/Time   HGBA1C 6.7 (H) 01/24/2021 11:32 AM   HGBA1C 6.3 (H) 10/12/2020 11:18 AM    Last diabetic Eye exam: No results found for: HMDIABEYEEXA  Last diabetic Foot exam: No results found for: HMDIABFOOTEX   No results found for: CHOL, HDL, LDLCALC, LDLDIRECT, TRIG, CHOLHDL  Hepatic Function Latest Ref Rng & Units 01/24/2021 10/12/2020 08/11/2020  Total Protein 6.0 - 8.5 g/dL 6.9 7.2 7.3  Albumin 3.8 - 4.9 g/dL 4.3 4.2 4.0  AST 0 - 40 IU/L 12 14 23  ALT 0 - 32 IU/L 17 18 21  Alk Phosphatase 44 - 121 IU/L 86 81 58  Total Bilirubin 0.0 - 1.2 mg/dL <0.2 0.3 0.7    Lab Results  Component Value Date/Time   TSH 0.614 01/24/2021 11:32 AM   TSH 3.220 10/12/2020 11:18 AM   FREET4 1.29 01/24/2021 11:32 AM    CBC Latest Ref Rng & Units 01/24/2021 10/12/2020 07/08/2020  WBC 3.4 - 10.8 x10E3/uL 10.7 8.2 11.8(H)  Hemoglobin 11.1 - 15.9 g/dL 15.2 15.1 14.7  Hematocrit 34.0 - 46.6 % 46.5 46.1 42.9  Platelets   150 - 450 x10E3/uL 429 388 409(H)    Lab Results  Component Value Date/Time   VD25OH 23.2 (L) 01/24/2021 11:32 AM   VD25OH 8.7 (L) 10/12/2020 11:18 AM    Clinical ASCVD: {YES/NO:21197} The ASCVD Risk score (Goff DC Jr., et al., 2013) failed to calculate for the following reasons:   Cannot find a previous HDL lab   Cannot find a previous total cholesterol lab    Depression screen PHQ 2/9 03/08/2021 01/24/2021 10/12/2020   Decreased Interest 3 3 0  Down, Depressed, Hopeless 3 2 0  PHQ - 2 Score 6 5 0  Altered sleeping 2 3 3  Tired, decreased energy 3 3 3  Change in appetite 0 1 2  Feeling bad or failure about yourself  0 0 0  Trouble concentrating 3 3 2  Moving slowly or fidgety/restless 1 1 2  Suicidal thoughts 0 0 0  PHQ-9 Score 15 16 12  Difficult doing work/chores Somewhat difficult Somewhat difficult Somewhat difficult     ***Other: (CHADS2VASc if Afib, MMRC or CAT for COPD, ACT, DEXA)  Social History   Tobacco Use  Smoking Status Current Every Day Smoker  . Packs/day: 1.00  . Years: 34.00  . Pack years: 34.00  . Types: Cigarettes  Smokeless Tobacco Never Used  Tobacco Comment   2-3 cig/day   BP Readings from Last 3 Encounters:  03/16/21 138/85  03/06/21 (!) 171/98  02/20/21 (!) 145/90   Pulse Readings from Last 3 Encounters:  03/16/21 91  03/06/21 91  02/20/21 88   Wt Readings from Last 3 Encounters:  03/16/21 172 lb 6.4 oz (78.2 kg)  03/06/21 167 lb 1.7 oz (75.8 kg)  02/20/21 167 lb (75.8 kg)   BMI Readings from Last 3 Encounters:  03/16/21 30.54 kg/m  03/06/21 29.60 kg/m  02/20/21 29.58 kg/m    Assessment/Interventions: Review of patient past medical history, allergies, medications, health status, including review of consultants reports, laboratory and other test data, was performed as part of comprehensive evaluation and provision of chronic care management services.   SDOH:  (Social Determinants of Health) assessments and interventions performed: {yes/no:20286}  SDOH Screenings   Alcohol Screen: Not on file  Depression (PHQ2-9): Medium Risk  . PHQ-2 Score: 15  Financial Resource Strain: Not on file  Food Insecurity: Not on file  Housing: Not on file  Physical Activity: Not on file  Social Connections: Not on file  Stress: Not on file  Tobacco Use: High Risk  . Smoking Tobacco Use: Current Every Day Smoker  . Smokeless Tobacco Use: Never Used   Transportation Needs: Not on file      Immunization History  Administered Date(s) Administered  . PFIZER(Purple Top)SARS-COV-2 Vaccination 04/21/2020, 05/21/2020  . Td 03/24/2019    Conditions to be addressed/monitored:  Hypertension, Diabetes and Rheumatoid arthritis, Depression, emphysema, fibromyalgia, chronic pain  There are no care plans that you recently modified to display for this patient.    Medication Assistance: {MEDASSISTANCEINFO:25044}  Compliance/Adherence/Medication fill history: Care Gaps: ***  Star-Rating Drugs: ***  Patient's preferred pharmacy is:  [facility] - [place], [place] - [address] [place] [address] Phone: [phone] Fax: [phone]  [facility] [place], [place] - [address] [place] [address] Phone: [phone] Fax: [phone]  Uses pill box? {Yes or If no, why not?:20788} Pt endorses ***% compliance  We discussed: {Pharmacy options:24294} Patient decided to: {US Pharmacy Plan:23885}  Care Plan and   Follow Up Patient Decision:  {FOLLOWUP:24991}  Plan: {CM FOLLOW UP PLAN:25073}  ***   Current Barriers:  . {pharmacybarriers:24917}  Pharmacist Clinical Goal(s):  Marland Kitchen Patient will {PHARMACYGOALCHOICES:24921} through collaboration with PharmD and provider.   Interventions: . 1:1 collaboration with Venita Lick, NP regarding development and update of comprehensive plan of care as evidenced by provider attestation and co-signature . Inter-disciplinary care team collaboration (see longitudinal plan of care) . Comprehensive medication review performed; medication list updated in electronic medical record  Hypertension (BP goal {CHL HP UPSTREAM Pharmacist BP ranges:[phone]}) -{US controlled/uncontrolled:25276} -Current treatment: . *** -Medications previously tried: ***  -Current home readings: *** -Current dietary habits: *** -Current exercise habits:  *** -{ACTIONS;DENIES/REPORTS:21021675::"Denies"} hypotensive/hypertensive symptoms -Educated on {CCM BP Counseling:25124} -Counseled to monitor BP at home ***, document, and provide log at future appointments -{CCMPHARMDINTERVENTION:25122}  Diabetes (A1c goal {A1c goals:23924}) -{US controlled/uncontrolled:25276} -Current medications: . *** -Medications previously tried: ***  -Current home glucose readings . fasting glucose: *** . post prandial glucose: *** -{ACTIONS;DENIES/REPORTS:21021675::"Denies"} hypoglycemic/hyperglycemic symptoms -Current meal patterns:  . breakfast: ***  . lunch: ***  . dinner: *** . snacks: *** . drinks: *** -Current exercise: *** -Educated on {CCM DM COUNSELING:25123} -Counseled to check feet daily and get yearly eye exams -{CCMPHARMDINTERVENTION:25122}  COPD (Goal: control symptoms and prevent exacerbations) -{US controlled/uncontrolled:25276} -Current treatment  . *** -Medications previously tried: ***  -Gold Grade: {CHL HP Upstream Pharm COPD Gold QQIWL:[id number]} -Current COPD Classification:  {CHL HP Upstream Pharm COPD Classification:[phone]} -MMRC/CAT score: *** -Pulmonary function testing: *** -Exacerbations requiring treatment in last 6 months: *** -Patient {Actions; denies-reports:120008} consistent use of maintenance inhaler -Frequency of rescue inhaler use: *** -Counseled on {CCMINHALERCOUNSELING:25121} -{CCMPHARMDINTERVENTION:25122}  Depression/Anxiety (Goal: ***) -{US controlled/uncontrolled:25276} -Current treatment: . *** -Medications previously tried/failed: *** -PHQ9: *** -GAD7: *** -Connected with *** for mental health support -Educated on {CCM mental health counseling:25127} -{CCMPHARMDINTERVENTION:25122}  Tobacco use (Goal ***) -{US controlled/uncontrolled:25276} -Previous quit attempts: *** -Current treatment  . *** -Patient smokes {Time to first cigarette:23873} -Patient triggers include: {Smoking  Triggers:23882} -On a scale of 1-10, reports MOTIVATION to quit is *** -On a scale of 1-10, reports CONFIDENCE in quitting is *** -{Smoking Cessation Counseling:23883} -{CCMPHARMDINTERVENTION:25122}  Rheumatoid/osteo arthritis (Goal: ***) -{US controlled/uncontrolled:25276} -Current treatment  . *** -Medications previously tried: ***  -{CCMPHARMDINTERVENTION:25122}   Patient Goals/Self-Care Activities . Patient will:  - {pharmacypatientgoals:24919}  Follow Up Plan: {CM FOLLOW UP DEYC:14481}

## 2021-03-17 NOTE — Telephone Encounter (Signed)
    Clinical Social Work  Chronic Care Management   Phone Outreach    03/17/2021 Name: Catherine Giles MRN: [id number] DOB: May 28, 1971  Catherine Giles is a 50 y.o. year old female who is a primary care patient of Cannady, Barbaraann Faster, NP .   CCM LCSW reached out to patient today by phone to introduce self, assess needs and offer Care Management services and interventions. The telephone call was answered; however, disconnected shortly after CCM LCSW greeted patient. CCM LCSW called again and left message. Telephone outreach was unsuccessful  Plan:CCM LCSW will wait for return call. If no return call is received, Will reach out to patient again in the next 14 days .   Review of patient status, including review of consultants reports, relevant laboratory and other test results, and collaboration with appropriate care team members and the patient's provider was performed as part of comprehensive patient evaluation and provision of care management services.     Christa See, MSW, [email or web address] Phone [phone] 4:49 PM

## 2021-03-18 ENCOUNTER — Telehealth: Payer: Medicare Other

## 2021-03-18 ENCOUNTER — Telehealth: Payer: Self-pay | Admitting: Pharmacist

## 2021-03-23 DIAGNOSIS — M0579 Rheumatoid arthritis with rheumatoid factor of multiple sites without organ or systems involvement: Secondary | ICD-10-CM | POA: Diagnosis not present

## 2021-03-30 ENCOUNTER — Ambulatory Visit
Admission: RE | Admit: 2021-03-30 | Discharge: 2021-03-30 | Disposition: A | Discharge status: Home / Self Care | Payer: Medicare Other | Source: Ambulatory Visit | Attending: Nurse Practitioner | Admitting: Nurse Practitioner

## 2021-03-30 ENCOUNTER — Other Ambulatory Visit: Payer: Self-pay

## 2021-03-30 ENCOUNTER — Telehealth (INDEPENDENT_AMBULATORY_CARE_PROVIDER_SITE_OTHER): Payer: Medicare Other | Admitting: Nurse Practitioner

## 2021-03-30 ENCOUNTER — Ambulatory Visit
Admission: RE | Admit: 2021-03-30 | Discharge: 2021-03-30 | Disposition: A | Discharge status: Home / Self Care | Payer: Medicare Other | Source: Home / Self Care | Attending: Nurse Practitioner | Admitting: Nurse Practitioner

## 2021-03-30 ENCOUNTER — Encounter: Payer: Self-pay | Admitting: Nurse Practitioner

## 2021-03-30 DIAGNOSIS — R0602 Shortness of breath: Secondary | ICD-10-CM

## 2021-03-30 DIAGNOSIS — R059 Cough, unspecified: Secondary | ICD-10-CM | POA: Diagnosis not present

## 2021-03-30 DIAGNOSIS — Z20822 Contact with and (suspected) exposure to covid-19: Secondary | ICD-10-CM

## 2021-03-30 NOTE — Progress Notes (Addendum)
There were no vitals taken for this visit.   Subjective:    Patient ID: Catherine Giles, female    DOB: 20-Jun-1971, 50 y.o.   MRN: [id number]  HPI: Catherine Giles is a 50 y.o. female  Chief Complaint  Patient presents with   URI   UPPER RESPIRATORY TRACT INFECTION Worst symptom: sore throat, diarrhea, sob, fatigue Fever: no Cough: yes Shortness of breath: yes Wheezing: no Chest pain: no Chest tightness: no Chest congestion: no Nasal congestion: yes Runny nose: no Post nasal drip: yes Sneezing: no Sore throat: yes Swollen glands: no Sinus pressure: no Headache: yes Face pain: no Toothache: no Ear pain: no bilateral Ear pressure: no bilateral Eyes red/itching: redness Eye drainage/crusting: no  Vomiting: no Rash: no Fatigue: yes Sick contacts: no Strep contacts: no  Context: stable Recurrent sinusitis: no Relief with OTC cold/cough medications: no  Treatments attempted: none   Relevant past medical, surgical, family and social history reviewed and updated as indicated. Interim medical history since our last visit reviewed. Allergies and medications reviewed and updated.  Review of Systems  Constitutional: Negative for fatigue and fever.  HENT: Positive for congestion, postnasal drip and sore throat. Negative for dental problem, ear pain, rhinorrhea, sinus pressure, sinus pain and sneezing.   Respiratory: Positive for cough and shortness of breath. Negative for wheezing.   Cardiovascular: Negative for chest pain.  Gastrointestinal: Positive for diarrhea. Negative for vomiting.  Skin: Negative for rash.  Neurological: Negative for headaches.    Per HPI unless specifically indicated above     Objective:    There were no vitals taken for this visit.  Wt Readings from Last 3 Encounters:  03/16/21 172 lb 6.4 oz (78.2 kg)  03/06/21 167 lb 1.7 oz (75.8 kg)  02/20/21 167 lb (75.8 kg)    Physical Exam Vitals and nursing note reviewed.  Pulmonary:      Effort: Pulmonary effort is normal. No respiratory distress.  Neurological:     Mental Status: She is alert.  Psychiatric:        Mood and Affect: Mood normal.        Behavior: Behavior normal.        Thought Content: Thought content normal.        Judgment: Judgment normal.     Results for orders placed or performed during the hospital encounter of 03/06/21  SARS CORONAVIRUS 2 (TAT 6-24 HRS) Nasopharyngeal Nasopharyngeal Swab   Specimen: Nasopharyngeal Swab  Result Value Ref Range   SARS Coronavirus 2 NEGATIVE NEGATIVE      Assessment & Plan:   Problem List Items Addressed This Visit   None    Visit Diagnoses     Exposure to COVID-19 virus    -  Primary   Patient swabbed for COVID 19. Discussed antiviral medication if patient is positive for COVID. Reviewed quarantine instructions and s/s to monitor for.   Relevant Orders   Novel Coronavirus, NAA ([facility])   SOB (shortness of breath)       Chest xray ordered. Will make recommendations based on imaging results. Discussed s/s to monitor for and when to seek higher level of care.    Relevant Orders   DG Chest 2 View        Follow up plan: Return if symptoms worsen or fail to improve.   This visit was completed via MyChart due to the restrictions of the COVID-19 pandemic. All issues as above were discussed and addressed. Physical exam was done as above through  visual confirmation on MyChart. If it was felt that the patient should be evaluated in the office, they were directed there. The patient verbally consented to this visit. Location of the patient: [facility] Location of the provider: Office Those involved with this call:  Provider: Jon Billings, NP CMA: Tiffany Reel, CMA Front Desk/Registration: Jill Side Time spent on call: 17 minutes with patient via phone conference. More than 50% of this time was spent in counseling and coordination of care. 21 minutes total spent in review of patient's record  and preparation of their chart.

## 2021-03-31 LAB — NOVEL CORONAVIRUS, NAA: SARS-CoV-2, NAA: NOT DETECTED

## 2021-03-31 LAB — SARS-COV-2, NAA 2 DAY TAT

## 2021-03-31 NOTE — Progress Notes (Signed)
Please let patient know that her COVID test was negative. I have not received her chest xray back yet but I will send another message once it is back.

## 2021-04-04 NOTE — Progress Notes (Signed)
Hi Catherine Giles.  Your chest xray was normal.  Hope you are feeling better.

## 2021-04-07 ENCOUNTER — Telehealth: Payer: Medicaid Other

## 2021-04-08 ENCOUNTER — Ambulatory Visit (INDEPENDENT_AMBULATORY_CARE_PROVIDER_SITE_OTHER): Payer: Medicare Other | Admitting: General Practice

## 2021-04-08 ENCOUNTER — Telehealth: Payer: Medicaid Other | Admitting: General Practice

## 2021-04-08 DIAGNOSIS — I10 Essential (primary) hypertension: Secondary | ICD-10-CM

## 2021-04-08 DIAGNOSIS — M797 Fibromyalgia: Secondary | ICD-10-CM

## 2021-04-08 DIAGNOSIS — I152 Hypertension secondary to endocrine disorders: Secondary | ICD-10-CM | POA: Diagnosis not present

## 2021-04-08 DIAGNOSIS — M0579 Rheumatoid arthritis with rheumatoid factor of multiple sites without organ or systems involvement: Secondary | ICD-10-CM | POA: Diagnosis not present

## 2021-04-08 DIAGNOSIS — F320 Major depressive disorder, single episode, mild: Secondary | ICD-10-CM

## 2021-04-08 DIAGNOSIS — E1159 Type 2 diabetes mellitus with other circulatory complications: Secondary | ICD-10-CM | POA: Diagnosis not present

## 2021-04-08 NOTE — Chronic Care Management (AMB) (Signed)
Chronic Care Management   CCM RN Visit Note  04/08/2021 Name: Catherine Giles MRN: [id number] DOB: Apr 14, 1971  Subjective: Catherine Giles is a 50 y.o. year old female who is a primary care patient of Cannady, Barbaraann Faster, NP. The care management team was consulted for assistance with disease management and care coordination needs.    Engaged with patient by telephone for follow up visit in response to provider referral for case management and/or care coordination services.   Consent to Services:  The patient was given information about Chronic Care Management services, agreed to services, and gave verbal consent prior to initiation of services.  Please see initial visit note for detailed documentation.   Patient agreed to services and verbal consent obtained.   Assessment: Review of patient past medical history, allergies, medications, health status, including review of consultants reports, laboratory and other test data, was performed as part of comprehensive evaluation and provision of chronic care management services.   SDOH (Social Determinants of Health) assessments and interventions performed:    CCM Care Plan  Allergies  Allergen Reactions   Azithromycin Anaphylaxis   Penicillins     Did it involve swelling of the face/tongue/throat, SOB, or low BP? Yes Did it involve sudden or severe rash/hives, skin peeling, or any reaction on the inside of your mouth or nose? No Did you need to seek medical attention at a hospital or doctor's office? No When did it last happen?       If all above answers are "NO", may proceed with cephalosporin use.    Shellfish Allergy     Outpatient Encounter Medications as of 04/08/2021  Medication Sig   albuterol (VENTOLIN HFA) 108 (90 Base) MCG/ACT inhaler Inhale 2 puffs into the lungs every 4 (four) hours as needed.    amLODipine (NORVASC) 5 MG tablet Take 1 tablet (5 mg total) by mouth daily.   Blood Glucose Monitoring Suppl (ONETOUCH VERIO) w/Device  KIT Use to check blood sugar 3 times daily with goal <130 fasting and <180 two hours after meal.  Bring meter to visits.   Cholecalciferol 1.25 MG (50000 UT) TABS Take 1 tablet by mouth once a week. For 8 weeks and then stop.  Return to office for lab draw.   cyproheptadine (PERIACTIN) 4 MG tablet Take 1 tablet (4 mg total) by mouth at bedtime. (Patient not taking: Reported on 03/16/2021)   dapagliflozin propanediol (FARXIGA) 10 MG TABS tablet Take 1 tablet (10 mg total) by mouth daily before breakfast.   DULoxetine (CYMBALTA) 30 MG capsule Start out with 30 mg (one tablet) once daily by mouth for 1 week, then increase to 60 mg (two tablets) once daily by mouth.   fexofenadine (ALLEGRA ALLERGY) 180 MG tablet Take 1 tablet (180 mg total) by mouth daily.   fluticasone (FLONASE) 50 MCG/ACT nasal spray Place 2 sprays into both nostrils daily.   Fluticasone-Umeclidin-Vilant (TRELEGY ELLIPTA) 100-62.5-25 MCG/INH AEPB Inhale 1 puff into the lungs daily.   gabapentin (NEURONTIN) 100 MG capsule Take 1 capsule (100 mg total) by mouth 3 (three) times daily.   glucose blood test strip Use to check blood sugar 3 times daily with goal <130 fasting and <180 two hours after meal.   ibuprofen (ADVIL) 400 MG tablet Take by mouth.   ipratropium-albuterol (DUONEB) 0.5-2.5 (3) MG/3ML SOLN Take 3 mLs by nebulization every 6 (six) hours as needed for up to 7 days.   Lactobacillus PACK Take 1 each by mouth as needed (for diarrhea.).   Lancets (  ONETOUCH ULTRASOFT) lancets Use to check blood sugar 3 times daily with goal <130 fasting and <180 two hours after meal.   leflunomide (ARAVA) 20 MG tablet Take 20 mg by mouth daily.   levothyroxine (SYNTHROID) 50 MCG tablet Take 50 mcg by mouth daily before breakfast.    liothyronine (CYTOMEL) 5 MCG tablet Take 5 mcg by mouth daily.   losartan (COZAAR) 100 MG tablet Take 100 mg by mouth daily.    metFORMIN (GLUCOPHAGE) 500 MG tablet Take 1 tablet (500 mg total) by mouth 2 (two)  times daily with a meal.   montelukast (SINGULAIR) 10 MG tablet Take 10 mg by mouth at bedtime.   nicotine (NICOTINE STEP 1) 21 mg/24hr patch Place 1 patch (21 mg total) onto the skin daily.   tiZANidine (ZANAFLEX) 4 MG tablet Take 1 tablet (4 mg total) by mouth every 8 (eight) hours as needed for muscle spasms.   triamcinolone cream (KENALOG) 0.1 % Apply 1 application topically 2 (two) times daily.   [DISCONTINUED] cetirizine (ZYRTEC) 10 MG tablet Take 1 tablet (10 mg total) by mouth daily.   [DISCONTINUED] traZODone (DESYREL) 50 MG tablet Take 1 tablet (50 mg total) by mouth at bedtime.   No facility-administered encounter medications on file as of 04/08/2021.    Patient Active Problem List   Diagnosis Date Noted   Depression, major, single episode, mild ([place]) 01/24/2021   Memory changes 10/12/2020   Dairy allergy 10/12/2020   Headache 10/12/2020   Vaginal stenosis 03/19/2020   Obesity 03/19/2020   Insomnia 01/05/2020   Complex regional pain syndrome I of upper limb 12/17/2019   Hypertension associated with diabetes ([place]) 12/13/2019   Centrilobular emphysema ([place]) 12/13/2019   Type 2 diabetes mellitus with obesity ([place]) 12/13/2019   Hypothyroid 12/13/2019   DDD (degenerative disc disease), lumbar 08/26/2019   Multinodular goiter 08/14/2019   Chronic left hip pain 05/05/2019   Osteoarthritis of spine with radiculopathy, cervical region 05/05/2019   Fibromyalgia 02/10/2019   Rheumatoid arthritis involving multiple sites with positive rheumatoid factor ([place]) 02/10/2019   Undifferentiated connective tissue disease ([place]) 02/10/2019   Cervical radiculopathy 03/13/2018   Vitamin D deficiency 10/11/2015    Conditions to be addressed/monitored:HTN, Depression, and chronic pain due to RA  Care Plan : RNCM: Depression (Adult)  Updates made by Vanita Ingles since 04/08/2021 12:00 AM     Problem: RNCM: Harm or Injury (Depression)   Priority: High     Long-Range Goal: RNCM: Harm or  Injury Prevented- depression   Priority: High  Note:   Current Barriers:  Knowledge Deficits related to resources for effective management of depression, depression worse due to severe pain and unable to take medication that was prescribed for depression Care Coordination needs related to effective management of depression exacerbation  in a patient with severe pain causing depression symptoms to be exacerbated Chronic Disease Management support and education needs related to depression with a patient with multiple chronic conditions including sever pain Lacks caregiver support.  Non-adherence to scheduled provider appointments Non-adherence to prescribed medication regimen Unable to independently manage depression due to severe pain and unable to take Cymbalta due to side effect of rash. Stopped taking the medication but did not notify the provider.  Unable to self administer medications as prescribed Does not attend all scheduled provider appointments Does not adhere to prescribed medication regimen Lacks social connections Does not contact provider office for questions/concerns  Nurse Case Manager Clinical Goal(s):  patient will verbalize understanding of plan for  effective management of depression  patient will work with [place], CCM team and pcp to address needs related to effective management of depression patient will demonstrate a decrease in depression  exacerbations as evidenced by finding medication to help with depression, working with CCM team to optimize mental health and well being.  patient will attend all scheduled medical appointments: 05-11-2021 at 0940 am patient will experience decrease in ED visits. ED visits in last 6 months = 6, 1 actuall ED, 5 urgent care patient will work with CM team pharmacist to assist with medication side effects, education, and reconciliation  patient will work with CM clinical social worker to worsening depression due to uncontrolled pain and  discomfort. 04-08-2021: Currently working with the LCSW for ongoing support and assistance   Interventions:  1:1 collaboration with Venita Lick, NP regarding development and update of comprehensive plan of care as evidenced by provider attestation and co-signature Inter-disciplinary care team collaboration (see longitudinal plan of care) Evaluation of current treatment plan related to depression  and patient's adherence to plan as established by provider. The patient is currently having an exacerbation of her depression and was very tearful. Empathetic listening and support given. Encouraged the patient to reach out for help if she felt hopeless or had thoughts of hurting self. 04-08-2021: The patient is feeling better today but still having issues with pain and depression. She just wants to have balance in her life. She states she talked to someone about her appointment with pain clinic but does not remember if an appointment was made. Gave her the information to call the pain clinic. The patient verbalized understanding. Empathetic listening and support given.   Advised patient to call the office for changes or questions, to be available when the office calls to make appointment with pcp (appointment secured for 03-15-2021 at 0940 am) to seek emergent help for suicidal ideation or thoughts of hurting self or others.  Provided education to patient re: the need to find a medication that would work well for her depression that does not cause side effects. The patient stopped taking the Cymbalta due to a rash. She says her depression is worse due to the severe pain and discomfort she is experiencing. 04-08-2021: The patient is not tearful today. States compliance with medications and they are "trying" to help but her pain level is so bad. She is hopeful the pain clinic can assist her and help her with management of her RA pain. She feels this will help improve her depressive state. Denies any SI at this time.  Will continue to monitor.  Reviewed medications with patient and discussed compliance. The patient is not taking any medications for depression at this time. 04-08-2021: States compliance with the medications at this time.  Collaborated with pcp, CCM team  regarding exacerbation of depression and the need to see pcp for evaluation and treatment Reviewed scheduled/upcoming provider appointments including: 05-11-2021 at [place] am Social Work referral for exacerbation of depression. 04-08-2021: The patient is actively working with the LCSW at this time for ongoing support and recommendations.  Pharmacy referral for medication side effects and medication reconciliation. Discussed plans with patient for ongoing care management follow up and provided patient with direct contact information for care management team  Patient Goals/Self-Care Activities Over the next 120 days, patient will:  - Patient will self administer medications as prescribed Patient will attend all scheduled provider appointments Patient will call pharmacy for medication refills Patient will attend church or other social activities Patient  will continue to perform ADL's independently Patient will continue to perform IADL's independently Patient will call provider office for new concerns or questions Patient will work with BSW to address care coordination needs and will continue to work with the clinical team to address health care and disease management related needs.   Seek emergent care for suicidal ideation or thoughts of harming self or others - action plan for worsening symptoms mutually developed - barriers to safety identified and addressed - emergency contact information provided - family/caregiver monitoring of patient's emotional state encouraged - modification of home and work environment promoted - personal safety plan developed - provision of safe home environment by family/caregiver encouraged - risk of self-injury  assessed - side effects of medication monitored - suicidal or self-injurious tendencies explored  Follow Up Plan: Telephone follow up appointment with care management team member scheduled for:  05-27-2021 at 1 pm       Care Plan : RNCM: Chronic Pain (Adult)  Updates made by Vanita Ingles since 04/08/2021 12:00 AM     Problem: RNCM: Pain Management Plan (Chronic Pain) left chest pain, RA, Fibromyalgia   Priority: High     Long-Range Goal: RNCM: Pain Management Plan Developed   Priority: High  Note:   Current Barriers:  Knowledge Deficits related to managing acute/chronic pain Non-adherence to scheduled provider appointments Non-adherence to prescribed medication regimen Difficulty obtaining medications Chronic Disease Management support and education needs related to chronic pain Unable to independently manage pain and discomfort as evidence of pain level at >10 today Unable to self administer medications as prescribed Does not attend all scheduled provider appointments Does not adhere to prescribed medication regimen Lacks social connections Does not contact provider office for questions/concerns Nurse Case Manager Clinical Goal(s):  patient will verbalize understanding of plan for managing pain patient will talk with RN Care Manager to address new concerns of pain and discomfort and help with exacerbation of pain and discomfort  patient will attend all scheduled medical appointments: 05-11-2021 at [place] am patient will demonstrate use of different relaxation  skills and/or diversional activities to assist with pain reduction (distraction, imagery, relaxation, massage, acupressure, TENS, heat, and cold application patient will report pain at a level less than 3 to 4 on a 10-10 rating scale patient will use pharmacological and nonpharmacological pain relief strategies patient will verbalize acceptable level of pain relief and ability to engage in desired activities patient will  engage in desired activities without an increase in pain level Interventions:  Collaboration with Marnee Guarneri T, NP regarding development and update of comprehensive plan of care as evidenced by provider attestation and co-signature Inter-disciplinary care team collaboration (see longitudinal plan of care) - deep breathing, relaxation and mindfulness use promoted - effectiveness of pharmacologic therapy monitored - medication-induced side effects managed - misuse of pain medication assessed - motivation and barriers to change assessed and addressed - mutually acceptable comfort goal set - pain assessed - pain treatment goals reviewed - premedication prior to activity encouraged Evaluation of current treatment plan related to pain: RA, fibromyalgia, and left chest pain (patient seen in ER and cardiac issues ruled out) and patient's adherence to plan as established by provider. 04-08-2021: The patient is still having issues with pain. She is concerned that the medications are causing diarrhea. She does not know what is going on. She states she is taking Farxiga 10 mg, and the duloxetine and gabapentin, along with tramadol. She states that she can tell it is "trying" to work.  When inquiring about the pain clinic the patient states she talked to someone but her memory is foggy and she cannot remember is an appointment was made. Review of upcoming appointments and no appointments noted. Did give the number to Dr. Holley Raring office at [phone] and ask the patient to call and get an appointment with Dr. Holley Raring. The patient was going to call after the call with the [facility].  Advised patient to call the office for worsening level or intensity of pain and to seek emergent care for unresolved pain and discomfort  Provided education to patient re: safety concerns, working with the CCM team and pcp, alternative pain relief methods.  Reviewed medications with patient and discussed compliance, the patient is  supposed to start infusions soon for her RA. She saw specialist last week and she is waiting on an appointment for infusion.  Collaborated with pcp, CCM team regarding Chronic pain and discomfort  Discussed plans with patient for ongoing care management follow up and provided patient with direct contact information for care management team Allow patient to maintain a diary of pain ratings, timing, precipitating events, medications, treatments, and what works best to relieve pain,  Refer to support groups and self-help groups Educate patient about the use of pharmacological interventions for pain management- antianxiety, antidepressants, NSAIDS, opioid analgesics,  Explain the importance of lifestyle modifications to effective pain management  Patient Goals/Self Care Activities:  - mutually acceptable comfort goal set - pain assessed - pain management plan developed - pain treatment goals reviewed - patient response to treatment assessed - sharing of pain management plan with teachers and other caregivers encouraged Self-administers medications as prescribed Attends all scheduled provider appointments Calls pharmacy for medication refills Calls provider office for new concerns or questions Follow Up Plan: Telephone follow up appointment with care management team member scheduled for: 05-27-2021 at 1 pm  Timeframe:  long-Term Goal Priority:  High Start Date:                             Expected End Date:         06-21-2022              Follow Up Date 05-27-2021 - call for medicine refill 2 or 3 days before it runs out - develop a personal pain management plan - keep track of prescription refills - plan exercise or activity when pain is best controlled - prioritize tasks for the day - track times pain is worst and when it is best - track what makes the pain worse and what makes it better - work slower and less intense when having pain    Why is this important?   Day-to-day life can be  hard when you have chronic pain.  Pain medicine is just one piece of the treatment puzzle.  You can try these action steps to help you manage your pain.   Notes: Appointment with the pcp on 03-15-2021. 6-17-202: The patient heard from Dr. Holley Raring office but does not remember appointment or anything like making an appointment. Supplied the number to Dr. Holley Raring office at [phone] for the patient to call after the call with the [facility]. Expressed the need to talk to the pain clinic and get established for help with pain management.    Timeframe:  long-Term Goal Priority:  High Start Date:  Expected End Date:    06-21-2022                 Follow Up Date 05-27-2021    - learn how to meditate - practice acceptance of chronic pain - spend time with positive people - tell myself I can (not I can't) - think of new ways to do favorite things    Why is this important?   Stress makes chronic pain feel worse.  Feelings like depression, anxiety, stress and anger can make your body more sensitive to pain.  Learning ways to cope with stress or depression may help you find some relief from the pain.     04-08-2021: The patient is still have considerable pain. The medications are helping some but not a lot. She was not tearful today but says there is no balance in her life and she is tired of feeling the way she does.     Care Plan : RNCM: Hypertension (Adult)  Updates made by Vanita Ingles since 04/08/2021 12:00 AM     Problem: RNCM: Hypertension (Hypertension)   Priority: Medium     Long-Range Goal: RNCM: Hypertension Monitored   Priority: Medium  Note:   Objective:  Last practice recorded BP readings:  BP Readings from Last 3 Encounters:  03/16/21 138/85  03/06/21 (!) 171/98  02/20/21 (!) 145/90    Most recent eGFR/CrCl:  Lab Results  Component Value Date   EGFR 106 01/24/2021    No components found for: CRCL Current Barriers:  Knowledge Deficits related to  basic understanding of hypertension pathophysiology and self care management Knowledge Deficits related to understanding of medications prescribed for management of hypertension Non-adherence to prescribed medication regimen Non-adherence to scheduled provider appointments Limited Social Support Unable to independently manage HTN Does not adhere to prescribed medication regimen Lacks social connections Does not contact provider office for questions/concerns Case Manager Clinical Goal(s):  patient will verbalize understanding of plan for hypertension management patient will attend all scheduled medical appointments: 05-11-2021 patient will demonstrate improved adherence to prescribed treatment plan for hypertension as evidenced by taking all medications as prescribed, monitoring and recording blood pressure as directed, adhering to low sodium/DASH diet patient will demonstrate improved health management independence as evidenced by checking blood pressure as directed and notifying PCP if SBP>150 or DBP > 60, taking all medications as prescribe, and adhering to a low sodium diet as discussed. patient will verbalize basic understanding of hypertension disease process and self health management plan as evidenced by compliance with heart healthy diet, compliance with medications and working with the CCM team to manage health and well being.  Interventions:  Collaboration with Venita Lick, NP regarding development and update of comprehensive plan of care as evidenced by provider attestation and co-signature Inter-disciplinary care team collaboration (see longitudinal plan of care) UNABLE to independently:manage HTN Evaluation of current treatment plan related to hypertension self management and patient's adherence to plan as established by provider. 04-08-2021: The patient states her blood pressure is up when she is in pain. The patient states that she wants to have a balance in her health.  Empathetic listening and support given. Will continue to monitor.  Provided education to patient re: stroke prevention, s/s of heart attack and stroke, DASH diet, complications of uncontrolled blood pressure Reviewed medications with patient and discussed importance of compliance Discussed plans with patient for ongoing care management follow up and provided patient with direct contact information for care management team Advised patient, providing education  and rationale, to monitor blood pressure daily and record, calling PCP for findings outside established parameters.  Reviewed scheduled/upcoming provider appointments including: 05-11-2021 Self-Care Activities: - Self administers medications as prescribed Attends all scheduled provider appointments Calls provider office for new concerns, questions, or BP outside discussed parameters Checks BP and records as discussed Follows a low sodium diet/DASH diet Patient Goals: - check blood pressure daily - choose a place to take my blood pressure (home, clinic or office, retail store) - write blood pressure results in a log or diary - agree on reward when goals are met - agree to work together to make changes - ask questions to understand - have a family meeting to talk about healthy habits - learn about high blood pressure - blood pressure trends reviewed - depression screen reviewed - home or ambulatory blood pressure monitoring encouraged Follow Up Plan: Telephone follow up appointment with care management team member scheduled for: 05-27-2021 at 1 pm     Plan:Telephone follow up appointment with care management team member scheduled for:  05-27-2021 at 1 pm  [place], MSN, [facility] Mobile: [phone]

## 2021-04-08 NOTE — Patient Instructions (Signed)
Visit Information  PATIENT GOALS:  Goals Addressed             This Visit's Progress    RNCM: Cope with Chronic Pain       Timeframe:  Long-Range Goal Priority:  High Start Date:                             Expected End Date:    06-21-2022                  Follow Up Date 05-27-2021    - learn how to meditate - practice acceptance of chronic pain - spend time with positive people - tell myself I can (not I can't) - think of new ways to do favorite things    Why is this important?   Stress makes chronic pain feel worse.  Feelings like depression, anxiety, stress and anger can make your body more sensitive to pain.  Learning ways to cope with stress or depression may help you find some relief from the pain.     04-08-2021: The patient is still have considerable pain. The medications are helping some but not a lot. She was not tearful today but says there is no balance in her life and she is tired of feeling the way she does.       RNCM: Manage Chronic Pain       Timeframe:  Long-Range Goal Priority:  High Start Date:                             Expected End Date:         06-21-2022              Follow Up Date 05-27-2021 - call for medicine refill 2 or 3 days before it runs out - develop a personal pain management plan - keep track of prescription refills - plan exercise or activity when pain is best controlled - prioritize tasks for the day - track times pain is worst and when it is best - track what makes the pain worse and what makes it better - work slower and less intense when having pain    Why is this important?   Day-to-day life can be hard when you have chronic pain.  Pain medicine is just one piece of the treatment puzzle.  You can try these action steps to help you manage your pain.    Notes: Appointment with the pcp on 03-15-2021. 6-17-202: The patient heard from Dr. Holley Raring office but does not remember appointment or anything like making an appointment. Supplied  the number to Dr. Holley Raring office at [phone] for the patient to call after the call with the [facility]. Expressed the need to talk to the pain clinic and get established for help with pain management.                Patient Care Plan: RNCM: Depression (Adult)     Problem Identified: RNCM: Harm or Injury (Depression)   Priority: High     Long-Range Goal: RNCM: Harm or Injury Prevented- depression   Priority: High  Note:   Current Barriers:  Knowledge Deficits related to resources for effective management of depression, depression worse due to severe pain and unable to take medication that was prescribed for depression Care Coordination needs related to effective management of depression exacerbation  in a patient with severe  pain causing depression symptoms to be exacerbated Chronic Disease Management support and education needs related to depression with a patient with multiple chronic conditions including sever pain Lacks caregiver support.  Non-adherence to scheduled provider appointments Non-adherence to prescribed medication regimen Unable to independently manage depression due to severe pain and unable to take Cymbalta due to side effect of rash. Stopped taking the medication but did not notify the provider.  Unable to self administer medications as prescribed Does not attend all scheduled provider appointments Does not adhere to prescribed medication regimen Lacks social connections Does not contact provider office for questions/concerns  Nurse Case Manager Clinical Goal(s):  patient will verbalize understanding of plan for effective management of depression  patient will work with [facility], CCM team and pcp to address needs related to effective management of depression patient will demonstrate a decrease in depression  exacerbations as evidenced by finding medication to help with depression, working with CCM team to optimize mental health and well being.  patient will attend  all scheduled medical appointments: 05-11-2021 at 0940 am patient will experience decrease in ED visits. ED visits in last 6 months = 6, 1 actuall ED, 5 urgent care patient will work with CM team pharmacist to assist with medication side effects, education, and reconciliation  patient will work with CM clinical social worker to worsening depression due to uncontrolled pain and discomfort. 04-08-2021: Currently working with the LCSW for ongoing support and assistance   Interventions:  1:1 collaboration with Venita Lick, NP regarding development and update of comprehensive plan of care as evidenced by provider attestation and co-signature Inter-disciplinary care team collaboration (see longitudinal plan of care) Evaluation of current treatment plan related to depression  and patient's adherence to plan as established by provider. The patient is currently having an exacerbation of her depression and was very tearful. Empathetic listening and support given. Encouraged the patient to reach out for help if she felt hopeless or had thoughts of hurting self. 04-08-2021: The patient is feeling better today but still having issues with pain and depression. She just wants to have balance in her life. She states she talked to someone about her appointment with pain clinic but does not remember if an appointment was made. Gave her the information to call the pain clinic. The patient verbalized understanding. Empathetic listening and support given.   Advised patient to call the office for changes or questions, to be available when the office calls to make appointment with pcp (appointment secured for 03-15-2021 at 0940 am) to seek emergent help for suicidal ideation or thoughts of hurting self or others.  Provided education to patient re: the need to find a medication that would work well for her depression that does not cause side effects. The patient stopped taking the Cymbalta due to a rash. She says her depression  is worse due to the severe pain and discomfort she is experiencing. 04-08-2021: The patient is not tearful today. States compliance with medications and they are "trying" to help but her pain level is so bad. She is hopeful the pain clinic can assist her and help her with management of her RA pain. She feels this will help improve her depressive state. Denies any SI at this time. Will continue to monitor.  Reviewed medications with patient and discussed compliance. The patient is not taking any medications for depression at this time. 04-08-2021: States compliance with the medications at this time.  Collaborated with pcp, CCM team  regarding exacerbation of depression  and the need to see pcp for evaluation and treatment Reviewed scheduled/upcoming provider appointments including: 05-11-2021 at [place] am Social Work referral for exacerbation of depression. 04-08-2021: The patient is actively working with the LCSW at this time for ongoing support and recommendations.  Pharmacy referral for medication side effects and medication reconciliation. Discussed plans with patient for ongoing care management follow up and provided patient with direct contact information for care management team  Patient Goals/Self-Care Activities Over the next 120 days, patient will:  - Patient will self administer medications as prescribed Patient will attend all scheduled provider appointments Patient will call pharmacy for medication refills Patient will attend church or other social activities Patient will continue to perform ADL's independently Patient will continue to perform IADL's independently Patient will call provider office for new concerns or questions Patient will work with BSW to address care coordination needs and will continue to work with the clinical team to address health care and disease management related needs.   Seek emergent care for suicidal ideation or thoughts of harming self or others - action plan for  worsening symptoms mutually developed - barriers to safety identified and addressed - emergency contact information provided - family/caregiver monitoring of patient's emotional state encouraged - modification of home and work environment promoted - personal safety plan developed - provision of safe home environment by family/caregiver encouraged - risk of self-injury assessed - side effects of medication monitored - suicidal or self-injurious tendencies explored  Follow Up Plan: Telephone follow up appointment with care management team member scheduled for:  05-27-2021 at 1 pm       Task: RNCM: Depression   Note:   Care Management Activities:    - action plan for worsening symptoms mutually developed - barriers to safety identified and addressed - emergency contact information provided - family/caregiver monitoring of patient's emotional state encouraged - modification of home and work environment promoted - Nurse, children's plan developed - provision of safe home environment by family/caregiver encouraged - risk of self-injury assessed - side effects of medication monitored - suicidal or self-injurious tendencies explored        Patient Care Plan: RNCM: Chronic Pain (Adult)     Problem Identified: RNCM: Pain Management Plan (Chronic Pain) left chest pain, RA, Fibromyalgia   Priority: High     Long-Range Goal: RNCM: Pain Management Plan Developed   Priority: High  Note:   Current Barriers:  Knowledge Deficits related to managing acute/chronic pain Non-adherence to scheduled provider appointments Non-adherence to prescribed medication regimen Difficulty obtaining medications Chronic Disease Management support and education needs related to chronic pain Unable to independently manage pain and discomfort as evidence of pain level at >10 today Unable to self administer medications as prescribed Does not attend all scheduled provider appointments Does not adhere to  prescribed medication regimen Lacks social connections Does not contact provider office for questions/concerns Nurse Case Manager Clinical Goal(s):  patient will verbalize understanding of plan for managing pain patient will talk with RN Care Manager to address new concerns of pain and discomfort and help with exacerbation of pain and discomfort  patient will attend all scheduled medical appointments: 05-11-2021 at 0940 am patient will demonstrate use of different relaxation  skills and/or diversional activities to assist with pain reduction (distraction, imagery, relaxation, massage, acupressure, TENS, heat, and cold application patient will report pain at a level less than 3 to 4 on a 10-10 rating scale patient will use pharmacological and nonpharmacological pain relief strategies patient will verbalize acceptable  level of pain relief and ability to engage in desired activities patient will engage in desired activities without an increase in pain level Interventions:  Collaboration with Venita Lick, NP regarding development and update of comprehensive plan of care as evidenced by provider attestation and co-signature Inter-disciplinary care team collaboration (see longitudinal plan of care) - deep breathing, relaxation and mindfulness use promoted - effectiveness of pharmacologic therapy monitored - medication-induced side effects managed - misuse of pain medication assessed - motivation and barriers to change assessed and addressed - mutually acceptable comfort goal set - pain assessed - pain treatment goals reviewed - premedication prior to activity encouraged Evaluation of current treatment plan related to pain: RA, fibromyalgia, and left chest pain (patient seen in ER and cardiac issues ruled out) and patient's adherence to plan as established by provider. 04-08-2021: The patient is still having issues with pain. She is concerned that the medications are causing diarrhea. She does  not know what is going on. She states she is taking Farxiga 10 mg, and the duloxetine and gabapentin, along with tramadol. She states that she can tell it is "trying" to work. When inquiring about the pain clinic the patient states she talked to someone but her memory is foggy and she cannot remember is an appointment was made. Review of upcoming appointments and no appointments noted. Did give the number to Dr. Holley Raring office at [phone] and ask the patient to call and get an appointment with Dr. Holley Raring. The patient was going to call after the call with the [facility].  Advised patient to call the office for worsening level or intensity of pain and to seek emergent care for unresolved pain and discomfort  Provided education to patient re: safety concerns, working with the CCM team and pcp, alternative pain relief methods.  Reviewed medications with patient and discussed compliance, the patient is supposed to start infusions soon for her RA. She saw specialist last week and she is waiting on an appointment for infusion.  Collaborated with pcp, CCM team regarding Chronic pain and discomfort  Discussed plans with patient for ongoing care management follow up and provided patient with direct contact information for care management team Allow patient to maintain a diary of pain ratings, timing, precipitating events, medications, treatments, and what works best to relieve pain,  Refer to support groups and self-help groups Educate patient about the use of pharmacological interventions for pain management- antianxiety, antidepressants, NSAIDS, opioid analgesics,  Explain the importance of lifestyle modifications to effective pain management  Patient Goals/Self Care Activities:  - mutually acceptable comfort goal set - pain assessed - pain management plan developed - pain treatment goals reviewed - patient response to treatment assessed - sharing of pain management plan with teachers and other caregivers  encouraged Self-administers medications as prescribed Attends all scheduled provider appointments Calls pharmacy for medication refills Calls provider office for new concerns or questions Follow Up Plan: Telephone follow up appointment with care management team member scheduled for: 05-27-2021 at 1 pm  Timeframe:  long-Term Goal Priority:  High Start Date:                             Expected End Date:         06-21-2022              Follow Up Date 05-27-2021 - call for medicine refill 2 or 3 days before it runs out - develop a personal pain  management plan - keep track of prescription refills - plan exercise or activity when pain is best controlled - prioritize tasks for the day - track times pain is worst and when it is best - track what makes the pain worse and what makes it better - work slower and less intense when having pain    Why is this important?   Day-to-day life can be hard when you have chronic pain.  Pain medicine is just one piece of the treatment puzzle.  You can try these action steps to help you manage your pain.   Notes: Appointment with the pcp on 03-15-2021. 6-17-202: The patient heard from Dr. Holley Raring office but does not remember appointment or anything like making an appointment. Supplied the number to Dr. Holley Raring office at [phone] for the patient to call after the call with the [facility]. Expressed the need to talk to the pain clinic and get established for help with pain management.    Timeframe:  long-Term Goal Priority:  High Start Date:                             Expected End Date:    06-21-2022                 Follow Up Date 05-27-2021    - learn how to meditate - practice acceptance of chronic pain - spend time with positive people - tell myself I can (not I can't) - think of new ways to do favorite things    Why is this important?   Stress makes chronic pain feel worse.  Feelings like depression, anxiety, stress and anger can make your body more  sensitive to pain.  Learning ways to cope with stress or depression may help you find some relief from the pain.     04-08-2021: The patient is still have considerable pain. The medications are helping some but not a lot. She was not tearful today but says there is no balance in her life and she is tired of feeling the way she does.     Task: RNCM: Partner to Develop Chronic Pain Management Plan   Note:   Care Management Activities:    - mutually acceptable comfort goal set - pain assessed - pain management plan developed - pain treatment goals reviewed - patient response to treatment assessed - sharing of pain management plan with teachers and other caregivers encouraged        Patient Care Plan: RNCM: Hypertension (Adult)     Problem Identified: RNCM: Hypertension (Hypertension)   Priority: Medium     Long-Range Goal: RNCM: Hypertension Monitored   Priority: Medium  Note:   Objective:  Last practice recorded BP readings:  BP Readings from Last 3 Encounters:  03/16/21 138/85  03/06/21 (!) 171/98  02/20/21 (!) 145/90    Most recent eGFR/CrCl:  Lab Results  Component Value Date   EGFR 106 01/24/2021    No components found for: CRCL Current Barriers:  Knowledge Deficits related to basic understanding of hypertension pathophysiology and self care management Knowledge Deficits related to understanding of medications prescribed for management of hypertension Non-adherence to prescribed medication regimen Non-adherence to scheduled provider appointments Limited Social Support Unable to independently manage HTN Does not adhere to prescribed medication regimen Lacks social connections Does not contact provider office for questions/concerns Case Manager Clinical Goal(s):  patient will verbalize understanding of plan for hypertension management patient will attend all scheduled  medical appointments: 05-11-2021 patient will demonstrate improved adherence to prescribed  treatment plan for hypertension as evidenced by taking all medications as prescribed, monitoring and recording blood pressure as directed, adhering to low sodium/DASH diet patient will demonstrate improved health management independence as evidenced by checking blood pressure as directed and notifying PCP if SBP>150 or DBP > 60, taking all medications as prescribe, and adhering to a low sodium diet as discussed. patient will verbalize basic understanding of hypertension disease process and self health management plan as evidenced by compliance with heart healthy diet, compliance with medications and working with the CCM team to manage health and well being.  Interventions:  Collaboration with Venita Lick, NP regarding development and update of comprehensive plan of care as evidenced by provider attestation and co-signature Inter-disciplinary care team collaboration (see longitudinal plan of care) UNABLE to independently:manage HTN Evaluation of current treatment plan related to hypertension self management and patient's adherence to plan as established by provider. 04-08-2021: The patient states her blood pressure is up when she is in pain. The patient states that she wants to have a balance in her health. Empathetic listening and support given. Will continue to monitor.  Provided education to patient re: stroke prevention, s/s of heart attack and stroke, DASH diet, complications of uncontrolled blood pressure Reviewed medications with patient and discussed importance of compliance Discussed plans with patient for ongoing care management follow up and provided patient with direct contact information for care management team Advised patient, providing education and rationale, to monitor blood pressure daily and record, calling PCP for findings outside established parameters.  Reviewed scheduled/upcoming provider appointments including: 05-11-2021 Self-Care Activities: - Self administers medications  as prescribed Attends all scheduled provider appointments Calls provider office for new concerns, questions, or BP outside discussed parameters Checks BP and records as discussed Follows a low sodium diet/DASH diet Patient Goals: - check blood pressure daily - choose a place to take my blood pressure (home, clinic or office, retail store) - write blood pressure results in a log or diary - agree on reward when goals are met - agree to work together to make changes - ask questions to understand - have a family meeting to talk about healthy habits - learn about high blood pressure - blood pressure trends reviewed - depression screen reviewed - home or ambulatory blood pressure monitoring encouraged Follow Up Plan: Telephone follow up appointment with care management team member scheduled for: 05-27-2021 at 1 pm    Task: RNCM: Identify and Monitor Blood Pressure Elevation   Note:   Care Management Activities:    - blood pressure trends reviewed - depression screen reviewed - home or ambulatory blood pressure monitoring encouraged          Patient verbalizes understanding of instructions provided today and agrees to view in [place].   Telephone follow up appointment with care management team member scheduled for: 05-27-2021 at 1 pm   Noreene Larsson RN, MSN, [facility] Mobile: [phone]

## 2021-04-13 ENCOUNTER — Other Ambulatory Visit: Payer: Self-pay | Admitting: "Endocrinology

## 2021-04-13 DIAGNOSIS — E039 Hypothyroidism, unspecified: Secondary | ICD-10-CM

## 2021-04-13 DIAGNOSIS — E042 Nontoxic multinodular goiter: Secondary | ICD-10-CM

## 2021-04-15 ENCOUNTER — Ambulatory Visit (INDEPENDENT_AMBULATORY_CARE_PROVIDER_SITE_OTHER): Payer: Medicare Other

## 2021-04-15 VITALS — Ht 63.0 in | Wt 160.0 lb

## 2021-04-15 DIAGNOSIS — Z0000 Encounter for general adult medical examination without abnormal findings: Secondary | ICD-10-CM | POA: Diagnosis not present

## 2021-04-15 NOTE — Addendum Note (Signed)
Addended by: Glenna Durand E on: 04/15/2021 11:57 AM   Modules accepted: Orders

## 2021-04-15 NOTE — Patient Instructions (Signed)
Catherine Giles , Thank you for taking time to come for your Medicare Wellness Visit. I appreciate your ongoing commitment to your health goals. Please review the following plan we discussed and let me know if I can assist you in the future.   Screening recommendations/referrals: Colonoscopy: declined Mammogram: due Bone Density: n/a Recommended yearly ophthalmology/optometry visit for glaucoma screening and checkup Recommended yearly dental visit for hygiene and checkup  Vaccinations: Influenza vaccine: decline Pneumococcal vaccine: decline Tdap vaccine: completed 03/24/2019 Shingles vaccine: discussed  Covid-19:  05/21/2020, 04/21/2020  Advanced directives: Advance directive discussed with you today.   Conditions/risks identified: smoking  Next appointment: Follow up in one year for your annual wellness visit.   Preventive Care 40-64 Years, Female Preventive care refers to lifestyle choices and visits with your health care provider that can promote health and wellness. What does preventive care include? A yearly physical exam. This is also called an annual well check. Dental exams once or twice a year. Routine eye exams. Ask your health care provider how often you should have your eyes checked. Personal lifestyle choices, including: Daily care of your teeth and gums. Regular physical activity. Eating a healthy diet. Avoiding tobacco and drug use. Limiting alcohol use. Practicing safe sex. Taking low-dose aspirin daily starting at age 6. Taking vitamin and mineral supplements as recommended by your health care provider. What happens during an annual well check? The services and screenings done by your health care provider during your annual well check will depend on your age, overall health, lifestyle risk factors, and family history of disease. Counseling  Your health care provider may ask you questions about your: Alcohol use. Tobacco use. Drug use. Emotional well-being. Home  and relationship well-being. Sexual activity. Eating habits. Work and work Statistician. Method of birth control. Menstrual cycle. Pregnancy history. Screening  You may have the following tests or measurements: Height, weight, and BMI. Blood pressure. Lipid and cholesterol levels. These may be checked every 5 years, or more frequently if you are over 58 years old. Skin check. Lung cancer screening. You may have this screening every year starting at age 74 if you have a 30-pack-year history of smoking and currently smoke or have quit within the past 15 years. Fecal occult blood test (FOBT) of the stool. You may have this test every year starting at age 71. Flexible sigmoidoscopy or colonoscopy. You may have a sigmoidoscopy every 5 years or a colonoscopy every 10 years starting at age 44. Hepatitis C blood test. Hepatitis B blood test. Sexually transmitted disease (STD) testing. Diabetes screening. This is done by checking your blood sugar (glucose) after you have not eaten for a while (fasting). You may have this done every 1-3 years. Mammogram. This may be done every 1-2 years. Talk to your health care provider about when you should start having regular mammograms. This may depend on whether you have a family history of breast cancer. BRCA-related cancer screening. This may be done if you have a family history of breast, ovarian, tubal, or peritoneal cancers. Pelvic exam and Pap test. This may be done every 3 years starting at age 41. Starting at age 67, this may be done every 5 years if you have a Pap test in combination with an HPV test. Bone density scan. This is done to screen for osteoporosis. You may have this scan if you are at high risk for osteoporosis. Discuss your test results, treatment options, and if necessary, the need for more tests with your health care  provider. Vaccines  Your health care provider may recommend certain vaccines, such as: Influenza vaccine. This is  recommended every year. Tetanus, diphtheria, and acellular pertussis (Tdap, Td) vaccine. You may need a Td booster every 10 years. Zoster vaccine. You may need this after age 70. Pneumococcal 13-valent conjugate (PCV13) vaccine. You may need this if you have certain conditions and were not previously vaccinated. Pneumococcal polysaccharide (PPSV23) vaccine. You may need one or two doses if you smoke cigarettes or if you have certain conditions. Talk to your health care provider about which screenings and vaccines you need and how often you need them. This information is not intended to replace advice given to you by your health care provider. Make sure you discuss any questions you have with your health care provider. Document Released: 11/05/2015 Document Revised: 06/28/2016 Document Reviewed: 08/10/2015 Elsevier Interactive Patient Education  2017 [place] Prevention in the Home Falls can cause injuries. They can happen to people of all ages. There are many things you can do to make your home safe and to help prevent falls. What can I do on the outside of my home? Regularly fix the edges of walkways and driveways and fix any cracks. Remove anything that might make you trip as you walk through a door, such as a raised step or threshold. Trim any bushes or trees on the path to your home. Use bright outdoor lighting. Clear any walking paths of anything that might make someone trip, such as rocks or tools. Regularly check to see if handrails are loose or broken. Make sure that both sides of any steps have handrails. Any raised decks and porches should have guardrails on the edges. Have any leaves, snow, or ice cleared regularly. Use sand or salt on walking paths during winter. Clean up any spills in your garage right away. This includes oil or grease spills. What can I do in the bathroom? Use night lights. Install grab bars by the toilet and in the tub and shower. Do not use  towel bars as grab bars. Use non-skid mats or decals in the tub or shower. If you need to sit down in the shower, use a plastic, non-slip stool. Keep the floor dry. Clean up any water that spills on the floor as soon as it happens. Remove soap buildup in the tub or shower regularly. Attach bath mats securely with double-sided non-slip rug tape. Do not have throw rugs and other things on the floor that can make you trip. What can I do in the bedroom? Use night lights. Make sure that you have a light by your bed that is easy to reach. Do not use any sheets or blankets that are too big for your bed. They should not hang down onto the floor. Have a firm chair that has side arms. You can use this for support while you get dressed. Do not have throw rugs and other things on the floor that can make you trip. What can I do in the kitchen? Clean up any spills right away. Avoid walking on wet floors. Keep items that you use a lot in easy-to-reach places. If you need to reach something above you, use a strong step stool that has a grab bar. Keep electrical cords out of the way. Do not use floor polish or wax that makes floors slippery. If you must use wax, use non-skid floor wax. Do not have throw rugs and other things on the floor that can make  you trip. What can I do with my stairs? Do not leave any items on the stairs. Make sure that there are handrails on both sides of the stairs and use them. Fix handrails that are broken or loose. Make sure that handrails are as long as the stairways. Check any carpeting to make sure that it is firmly attached to the stairs. Fix any carpet that is loose or worn. Avoid having throw rugs at the top or bottom of the stairs. If you do have throw rugs, attach them to the floor with carpet tape. Make sure that you have a light switch at the top of the stairs and the bottom of the stairs. If you do not have them, ask someone to add them for you. What else can I do to  help prevent falls? Wear shoes that: Do not have high heels. Have rubber bottoms. Are comfortable and fit you well. Are closed at the toe. Do not wear sandals. If you use a stepladder: Make sure that it is fully opened. Do not climb a closed stepladder. Make sure that both sides of the stepladder are locked into place. Ask someone to hold it for you, if possible. Clearly mark and make sure that you can see: Any grab bars or handrails. First and last steps. Where the edge of each step is. Use tools that help you move around (mobility aids) if they are needed. These include: Canes. Walkers. Scooters. Crutches. Turn on the lights when you go into a dark area. Replace any light bulbs as soon as they burn out. Set up your furniture so you have a clear path. Avoid moving your furniture around. If any of your floors are uneven, fix them. If there are any pets around you, be aware of where they are. Review your medicines with your doctor. Some medicines can make you feel dizzy. This can increase your chance of falling. Ask your doctor what other things that you can do to help prevent falls. This information is not intended to replace advice given to you by your health care provider. Make sure you discuss any questions you have with your health care provider. Document Released: 08/05/2009 Document Revised: 03/16/2016 Document Reviewed: 11/13/2014 Elsevier Interactive Patient Education  2017 [employer].

## 2021-04-15 NOTE — Progress Notes (Signed)
I connected with Catherine Giles today by telephone and verified that I am speaking with the correct person using two identifiers. Location patient: home Location provider: work Persons participating in the virtual visit: Catherine Giles, Glenna Durand LPN.   I discussed the limitations, risks, security and privacy concerns of performing an evaluation and management service by telephone and the availability of in person appointments. I also discussed with the patient that there may be a patient responsible charge related to this service. The patient expressed understanding and verbally consented to this telephonic visit.    Interactive audio and video telecommunications were attempted between this provider and patient, however failed, due to patient having technical difficulties OR patient did not have access to video capability.  We continued and completed visit with audio only.     Vital signs may be patient reported or missing.  Subjective:   Catherine Giles is a 50 y.o. female who presents for an Initial Medicare Annual Wellness Visit.  Review of Systems     Cardiac Risk Factors include: diabetes mellitus;hypertension     Objective:    Today's Vitals   04/15/21 1122 04/15/21 1124  Weight: 160 lb (72.6 kg)   Height: 5' 3"  (1.6 m)   PainSc:  8    Body mass index is 28.34 kg/m.  Advanced Directives 04/15/2021 03/06/2021 10/25/2020 09/19/2020 08/11/2020 07/08/2020 06/12/2020  Does Patient Have a Medical Advance Directive? No No No No No No No  Type of Advance Directive - - - - - - -  Does patient want to make changes to medical advance directive? - - - - - - -  Copy of [place] in Chart? - - - - - - -  Would patient like information on creating a medical advance directive? - - - - No - Patient declined No - Patient declined -    Current Medications (verified) Outpatient Encounter Medications as of 04/15/2021  Medication Sig   amLODipine (NORVASC) 5 MG tablet Take 1  tablet (5 mg total) by mouth daily.   Blood Glucose Monitoring Suppl (ONETOUCH VERIO) w/Device KIT Use to check blood sugar 3 times daily with goal <130 fasting and <180 two hours after meal.  Bring meter to visits.   Cholecalciferol 1.25 MG (50000 UT) TABS Take 1 tablet by mouth once a week. For 8 weeks and then stop.  Return to office for lab draw.   dapagliflozin propanediol (FARXIGA) 10 MG TABS tablet Take 1 tablet (10 mg total) by mouth daily before breakfast.   DULoxetine (CYMBALTA) 30 MG capsule Start out with 30 mg (one tablet) once daily by mouth for 1 week, then increase to 60 mg (two tablets) once daily by mouth.   albuterol (VENTOLIN HFA) 108 (90 Base) MCG/ACT inhaler Inhale 2 puffs into the lungs every 4 (four) hours as needed.    cyproheptadine (PERIACTIN) 4 MG tablet Take 1 tablet (4 mg total) by mouth at bedtime. (Patient not taking: No sig reported)   fexofenadine (ALLEGRA ALLERGY) 180 MG tablet Take 1 tablet (180 mg total) by mouth daily.   fluticasone (FLONASE) 50 MCG/ACT nasal spray Place 2 sprays into both nostrils daily.   Fluticasone-Umeclidin-Vilant (TRELEGY ELLIPTA) 100-62.5-25 MCG/INH AEPB Inhale 1 puff into the lungs daily.   gabapentin (NEURONTIN) 100 MG capsule Take 1 capsule (100 mg total) by mouth 3 (three) times daily.   glucose blood test strip Use to check blood sugar 3 times daily with goal <130 fasting and <180 two hours after  meal.   ibuprofen (ADVIL) 400 MG tablet Take by mouth.   ipratropium-albuterol (DUONEB) 0.5-2.5 (3) MG/3ML SOLN Take 3 mLs by nebulization every 6 (six) hours as needed for up to 7 days.   Lactobacillus PACK Take 1 each by mouth as needed (for diarrhea.).   Lancets (ONETOUCH ULTRASOFT) lancets Use to check blood sugar 3 times daily with goal <130 fasting and <180 two hours after meal.   leflunomide (ARAVA) 20 MG tablet Take 20 mg by mouth daily.   levothyroxine (SYNTHROID) 50 MCG tablet Take 50 mcg by mouth daily before breakfast.     liothyronine (CYTOMEL) 5 MCG tablet Take 5 mcg by mouth daily.   losartan (COZAAR) 100 MG tablet Take 100 mg by mouth daily.    metFORMIN (GLUCOPHAGE) 500 MG tablet Take 1 tablet (500 mg total) by mouth 2 (two) times daily with a meal.   montelukast (SINGULAIR) 10 MG tablet Take 10 mg by mouth at bedtime.   nicotine (NICOTINE STEP 1) 21 mg/24hr patch Place 1 patch (21 mg total) onto the skin daily.   tiZANidine (ZANAFLEX) 4 MG tablet Take 1 tablet (4 mg total) by mouth every 8 (eight) hours as needed for muscle spasms.   triamcinolone cream (KENALOG) 0.1 % Apply 1 application topically 2 (two) times daily.   [DISCONTINUED] cetirizine (ZYRTEC) 10 MG tablet Take 1 tablet (10 mg total) by mouth daily.   [DISCONTINUED] traZODone (DESYREL) 50 MG tablet Take 1 tablet (50 mg total) by mouth at bedtime.   No facility-administered encounter medications on file as of 04/15/2021.    Allergies (verified) Azithromycin, Penicillins, and Shellfish allergy   History: Past Medical History:  Diagnosis Date   Arthritis    RA   Asthma    COPD (chronic obstructive pulmonary disease) ([place])    Depression    Diabetes mellitus without complication ([place])    Emphysema of lung ([place])    Family history of breast cancer    6/21 cancer genetic testing letter sent   Fibromyalgia    Hypertension    Lupus ([place])    Pre-diabetes    Thyroid disease    Past Surgical History:  Procedure Laterality Date   MOUTH SURGERY     Family History  Problem Relation Age of Onset   Lung cancer Mother    Diabetes Mother    Breast cancer Mother 71   Hypertension Father    Cancer Maternal Aunt    Ovarian cancer Maternal Aunt    Diabetes Maternal Aunt    Diabetes Maternal Uncle    Hypertension Maternal Grandmother    Diabetes Maternal Grandmother    Social History   Socioeconomic History   Marital status: Divorced    Spouse name: Not on file   Number of children: Not on file   Years of education: Not on file    Highest education level: Not on file  Occupational History   Not on file  Tobacco Use   Smoking status: Every Day    Packs/day: 1.00    Years: 34.00    Pack years: 34.00    Types: Cigarettes   Smokeless tobacco: Never   Tobacco comments:    2-3 cig/day  Vaping Use   Vaping Use: Never used  Substance and Sexual Activity   Alcohol use: Yes    Comment: Occ   Drug use: Not Currently   Sexual activity: Yes  Other Topics Concern   Not on file  Social History Narrative   Not on file  Social Determinants of Health   Financial Resource Strain: Medium Risk   Difficulty of Paying Living Expenses: Somewhat hard  Food Insecurity: Food Insecurity Present   Worried About [place] in the Last Year: Sometimes true   Ran Out of Food in the Last Year: Sometimes true  Transportation Needs: No Transportation Needs   Lack of Transportation (Medical): No   Lack of Transportation (Non-Medical): No  Physical Activity: Inactive   Days of Exercise per Week: 0 days   Minutes of Exercise per Session: 0 min  Stress: No Stress Concern Present   Feeling of Stress : Not at all  Social Connections: Not on file    Tobacco Counseling Ready to quit: Not Answered Counseling given: Not Answered Tobacco comments: 2-3 cig/day   Clinical Intake:  Pre-visit preparation completed: Yes  Pain : 0-10 Pain Score: 8  Pain Type: Chronic pain Pain Location: Generalized Pain Descriptors / Indicators: Aching, Dull, Sharp, Tingling, Numbness Pain Onset: More than a month ago Pain Frequency: Constant     Nutritional Status: BMI 25 -29 Overweight Nutritional Risks: Nausea/ vomitting/ diarrhea (nauseous, diarrhea) Diabetes: No  How often do you need to have someone help you when you read instructions, pamphlets, or other written materials from your doctor or pharmacy?: 1 - Never What is the last grade level you completed in school?: some college  Diabetic? Yes Nutrition Risk  Assessment:  Has the patient had any N/V/D within the last 2 months?  Yes  Does the patient have any non-healing wounds?  No  Has the patient had any unintentional weight loss or weight gain?  Yes   Diabetes:  Is the patient diabetic?  Yes  If diabetic, was a CBG obtained today?  No  Did the patient bring in their glucometer from home?  No  How often do you monitor your CBG's? Twice daily.   Financial Strains and Diabetes Management:  Are you having any financial strains with the device, your supplies or your medication? No .  Does the patient want to be seen by Chronic Care Management for management of their diabetes?  No  Would the patient like to be referred to a Nutritionist or for Diabetic Management?  No   Diabetic Exams:  Diabetic Eye Exam: Overdue for diabetic eye exam. Pt has been advised about the importance in completing this exam. Patient advised to call and schedule an eye exam. Diabetic Foot Exam: Completed 03/16/2021   Interpreter Needed?: No  Information entered by :: NAllen LPN   Activities of Daily Living In your present state of health, do you have any difficulty performing the following activities: 04/15/2021 05/17/2020  Hearing? N N  Vision? Y N  Difficulty concentrating or making decisions? N N  Walking or climbing stairs? Y N  Comment with pain -  Dressing or bathing? Y N  Doing errands, shopping? N -  Preparing Food and eating ? Y -  Using the Toilet? N -  In the past six months, have you accidently leaked urine? N -  Do you have problems with loss of bowel control? N -  Managing your Medications? Y -  Managing your Finances? Y -  Housekeeping or managing your Housekeeping? Y -  Some recent data might be hidden    Patient Care Team: Venita Lick, NP as PCP - General (Nurse Practitioner) Greg Cutter, LCSW as [place] Management (Licensed Clinical Social Worker) Kenton Kingfisher, Junita Push, [facility] (Pharmacist) Vanita Ingles, RN  as Case Manager Hotel manager)  Indicate any recent [place] you may have received from other than [facility] providers in the past year (date may be approximate).     Assessment:   This is a routine wellness examination for [place].  Hearing/Vision screen Vision Screening - Comments:: Regular eye exams, Dr. Ellin Mayhew  Dietary issues and exercise activities discussed: Current Exercise Habits: The patient does not participate in regular exercise at present   Goals Addressed             This Visit's Progress    Patient Stated       04/15/2021, wants to get better        Depression Screen PHQ 2/9 Scores 04/15/2021 03/08/2021 01/24/2021 10/12/2020 05/20/2019  PHQ - 2 Score 4 6 5  0 1  PHQ- 9 Score 14 15 16 12 10   Exception Documentation - Other- indicate reason in comment box - - -  Not completed - the patient states that she is more depressed due to uncontrolled pain - - -    Fall Risk Fall Risk  04/15/2021 05/20/2019  Falls in the past year? 1 0  Comment leg gave out -  Number falls in past yr: 0 0  Injury with Fall? 0 0  Risk for fall due to : Impaired balance/gait;Impaired mobility;Medication side effect -  Follow up Falls evaluation completed;Education provided;Falls prevention discussed -    FALL RISK PREVENTION PERTAINING TO THE HOME:  Any stairs in or around the home? No  If so, are there any without handrails?  Home free of loose throw rugs in walkways, pet beds, electrical cords, etc? Yes  Adequate lighting in your home to reduce risk of falls? Yes   ASSISTIVE DEVICES UTILIZED TO PREVENT FALLS:  Life alert? No  Use of a cane, walker or w/c? Yes  Grab bars in the bathroom? Yes  Shower chair or bench in shower? No  Elevated toilet seat or a handicapped toilet? No   TIMED UP AND GO:  Was the test performed? No .      Cognitive Function:     6CIT Screen 10/12/2020  What Year? 0 points  What month? 0 points  What time? 0 points  Count back from 20  0 points  Months in reverse 0 points  Repeat phrase 8 points  Total Score 8    Immunizations Immunization History  Administered Date(s) Administered   PFIZER(Purple Top)SARS-COV-2 Vaccination 04/21/2020, 05/21/2020   Td 03/24/2019    TDAP status: Up to date  Flu Vaccine status: Declined, Education has been provided regarding the importance of this vaccine but patient still declined. Advised may receive this vaccine at local pharmacy or Health Dept. Aware to provide a copy of the vaccination record if obtained from local pharmacy or Health Dept. Verbalized acceptance and understanding.  Pneumococcal vaccine status: Declined,  Education has been provided regarding the importance of this vaccine but patient still declined. Advised may receive this vaccine at local pharmacy or Health Dept. Aware to provide a copy of the vaccination record if obtained from local pharmacy or Health Dept. Verbalized acceptance and understanding.   Covid-19 vaccine status: Completed vaccines  Qualifies for Shingles Vaccine? Yes   Zostavax completed No   Shingrix Completed?: No.    Education has been provided regarding the importance of this vaccine. Patient has been advised to call insurance company to determine out of pocket expense if they have not yet received this vaccine. Advised may also receive vaccine at local  pharmacy or Health Dept. Verbalized acceptance and understanding.  Screening Tests Health Maintenance  Topic Date Due   Pneumococcal Vaccine 48-37 Years old (1 - PCV) Never done   OPHTHALMOLOGY EXAM  Never done   Zoster Vaccines- Shingrix (1 of 2) Never done   MAMMOGRAM  Never done   COVID-19 Vaccine (3 - Pfizer risk series) 06/18/2020   Hepatitis C Screening  10/12/2021 (Originally 03/22/1987)   COLONOSCOPY (Pts 45-43yr Insurance coverage will need to be confirmed)  01/24/2022 (Originally 03/21/2014)   PNEUMOCOCCAL POLYSACCHARIDE VACCINE AGE 109-64 HIGH RISK  01/24/2022 (Originally 03/22/1971)    INFLUENZA VACCINE  05/23/2021   HEMOGLOBIN A1C  07/26/2021   FOOT EXAM  03/16/2022   PAP SMEAR-Modifier  04/02/2023   TETANUS/TDAP  03/23/2029   HIV Screening  Completed   HPV VACCINES  Aged Out    Health Maintenance  Health Maintenance Due  Topic Date Due   Pneumococcal Vaccine 0462Years old (1 - PCV) Never done   OPHTHALMOLOGY EXAM  Never done   Zoster Vaccines- Shingrix (1 of 2) Never done   MAMMOGRAM  Never done   COVID-19 Vaccine (3 - Pfizer risk series) 06/18/2020    Colorectal cancer screening: due   Mammogram status: due  Bone Density status: n/a  Lung Cancer Screening: (Low Dose CT Chest recommended if Age 52-80years, 30 pack-year currently smoking OR have quit w/in 15years.) does not qualify.   Lung Cancer Screening Referral: no  Additional Screening:  Hepatitis C Screening: does qualify; decline  Vision Screening: Recommended annual ophthalmology exams for early detection of glaucoma and other disorders of the eye. Is the patient up to date with their annual eye exam?  No  Who is the provider or what is the name of the office in which the patient attends annual eye exams? Dr. WEllin MayhewIf pt is not established with a provider, would they like to be referred to a provider to establish care? No .   Dental Screening: Recommended annual dental exams for proper oral hygiene  Community Resource Referral / Chronic Care Management: CRR required this visit?  No   CCM required this visit?  No      Plan:     I have personally reviewed and noted the following in the patient's chart:   Medical and social history Use of alcohol, tobacco or illicit drugs  Current medications and supplements including opioid prescriptions. Patient is not currently taking opioid prescriptions. Functional ability and status Nutritional status Physical activity Advanced directives List of other physicians Hospitalizations, surgeries, and ER visits in previous 12  months Vitals Screenings to include cognitive, depression, and falls Referrals and appointments  In addition, I have reviewed and discussed with patient certain preventive protocols, quality metrics, and best practice recommendations. A written personalized care plan for preventive services as well as general preventive health recommendations were provided to patient.     NKellie Simmering LPN   [id number]  Nurse Notes: 6 CIT not administered. Patient appears cognitive through direct conversation.

## 2021-04-18 ENCOUNTER — Telehealth: Payer: Self-pay | Admitting: *Deleted

## 2021-04-18 NOTE — Telephone Encounter (Signed)
   Telephone encounter was:  Unsuccessful.  04/18/2021 Name: Catherine Giles MRN: [id number] DOB: 1971-08-10  Unsuccessful outbound call made today to assist with:  Food Insecurity  Outreach Attempt:  1st Attempt  A HIPAA compliant voice message was left requesting a return call.  Instructed patient to call back at   Instructed patient to call back at [phone]  at their earliest convenience.   [place], Care Management  [phone] [address] , [place] [address] Email : [email or web address]

## 2021-04-20 DIAGNOSIS — M0579 Rheumatoid arthritis with rheumatoid factor of multiple sites without organ or systems involvement: Secondary | ICD-10-CM | POA: Diagnosis not present

## 2021-04-27 ENCOUNTER — Ambulatory Visit
Admission: RE | Admit: 2021-04-27 | Discharge: 2021-04-27 | Disposition: A | Discharge status: Home / Self Care | Payer: Medicare Other | Source: Ambulatory Visit | Attending: "Endocrinology | Admitting: "Endocrinology

## 2021-04-27 ENCOUNTER — Other Ambulatory Visit: Payer: Self-pay

## 2021-04-27 ENCOUNTER — Ambulatory Visit: Admission: RE | Admit: 2021-04-27 | Payer: Medicare Other | Source: Ambulatory Visit

## 2021-04-27 DIAGNOSIS — R29898 Other symptoms and signs involving the musculoskeletal system: Secondary | ICD-10-CM | POA: Diagnosis not present

## 2021-04-27 DIAGNOSIS — E042 Nontoxic multinodular goiter: Secondary | ICD-10-CM

## 2021-04-27 DIAGNOSIS — M5412 Radiculopathy, cervical region: Secondary | ICD-10-CM | POA: Diagnosis not present

## 2021-04-27 DIAGNOSIS — G5602 Carpal tunnel syndrome, left upper limb: Secondary | ICD-10-CM | POA: Diagnosis not present

## 2021-04-27 DIAGNOSIS — E039 Hypothyroidism, unspecified: Secondary | ICD-10-CM | POA: Diagnosis not present

## 2021-04-27 DIAGNOSIS — R202 Paresthesia of skin: Secondary | ICD-10-CM | POA: Diagnosis not present

## 2021-04-27 DIAGNOSIS — Q892 Congenital malformations of other endocrine glands: Secondary | ICD-10-CM | POA: Diagnosis not present

## 2021-04-27 DIAGNOSIS — M79602 Pain in left arm: Secondary | ICD-10-CM | POA: Diagnosis not present

## 2021-04-27 DIAGNOSIS — R200 Anesthesia of skin: Secondary | ICD-10-CM | POA: Diagnosis not present

## 2021-04-27 DIAGNOSIS — M542 Cervicalgia: Secondary | ICD-10-CM | POA: Diagnosis not present

## 2021-05-02 ENCOUNTER — Telehealth: Payer: Self-pay | Admitting: *Deleted

## 2021-05-02 NOTE — Telephone Encounter (Signed)
   Telephone encounter was:  Unsuccessful.  05/02/2021 Name: Catherine Giles MRN: [id number] DOB: [birth year]  Unsuccessful outbound call made today to assist with:  Food Insecurity  Outreach Attempt:  2nd Attempt  A HIPAA compliant voice message was left requesting a return call.  Instructed patient to call back at [place] , [place], Care Management  [phone] [address] , [place] [address] Email : [email or web address]

## 2021-05-03 ENCOUNTER — Telehealth: Payer: Self-pay | Admitting: *Deleted

## 2021-05-03 NOTE — Telephone Encounter (Signed)
   Telephone encounter was:  Successful.  05/03/2021 Name: Catherine Giles MRN: [id number] DOB: Nov 02, 1970  Catherine Giles is a 50 y.o. year old female who is a primary care patient of [place], Barbaraann Faster, NP . The community resource team was consulted for assistance with patient behind on rent due to illness and also utilities does not need food assitance at theis time but will submit utilities for the [place] guide performed the following interventions: Patient provided with information about care guide support team and interviewed to confirm resource needs.  Follow Up Plan:  Care guide will follow up with patient by phone over the next when I hear from the fund on paying [place], Care Management  [phone] [address] , [place] [address] Email : [email or web address]

## 2021-05-05 ENCOUNTER — Telehealth: Payer: Self-pay | Admitting: *Deleted

## 2021-05-05 NOTE — Telephone Encounter (Signed)
   Telephone encounter was:  Unsuccessful.  05/05/2021 Name: Catherine Giles MRN: [id number] DOB: [birth year]  Unsuccessful outbound call made today to assist with:  Have left message still have not received emailed bills for [facility]  Outreach Attempt:  3rd Attempt.  Referral closed unable to contact patient.  A HIPAA compliant voice message was left requesting a return call.  Instructed patient to call back at   Instructed patient to call back at [phone]  at their earliest convenience.   [place], Care Management  [phone] [address] , [place] [address] Email : [email or web address]

## 2021-05-11 ENCOUNTER — Ambulatory Visit: Payer: Medicare Other | Admitting: Nurse Practitioner

## 2021-05-17 ENCOUNTER — Encounter: Payer: Self-pay | Admitting: Nurse Practitioner

## 2021-05-17 ENCOUNTER — Ambulatory Visit: Payer: Medicare Other | Admitting: Nurse Practitioner

## 2021-05-20 DIAGNOSIS — Z20822 Contact with and (suspected) exposure to covid-19: Secondary | ICD-10-CM | POA: Diagnosis not present

## 2021-05-27 ENCOUNTER — Telehealth: Payer: Self-pay

## 2021-05-27 ENCOUNTER — Telehealth: Payer: Medicaid Other | Admitting: General Practice

## 2021-05-27 ENCOUNTER — Ambulatory Visit (INDEPENDENT_AMBULATORY_CARE_PROVIDER_SITE_OTHER): Payer: Medicare Other | Admitting: General Practice

## 2021-05-27 DIAGNOSIS — I10 Essential (primary) hypertension: Secondary | ICD-10-CM

## 2021-05-27 DIAGNOSIS — I152 Hypertension secondary to endocrine disorders: Secondary | ICD-10-CM | POA: Diagnosis not present

## 2021-05-27 DIAGNOSIS — M797 Fibromyalgia: Secondary | ICD-10-CM

## 2021-05-27 DIAGNOSIS — M0579 Rheumatoid arthritis with rheumatoid factor of multiple sites without organ or systems involvement: Secondary | ICD-10-CM | POA: Diagnosis not present

## 2021-05-27 DIAGNOSIS — R413 Other amnesia: Secondary | ICD-10-CM

## 2021-05-27 DIAGNOSIS — E1159 Type 2 diabetes mellitus with other circulatory complications: Secondary | ICD-10-CM

## 2021-05-27 DIAGNOSIS — F320 Major depressive disorder, single episode, mild: Secondary | ICD-10-CM | POA: Diagnosis not present

## 2021-05-27 NOTE — Patient Instructions (Signed)
Visit Information  PATIENT GOALS:  Goals Addressed             This Visit's Progress    RNCM: Cope with Chronic Pain       Timeframe:  Long-Range Goal Priority:  High Start Date:    03-08-2021                         Expected End Date:    06-21-2022                  Follow Up Date 07-22-2021    - learn how to meditate - practice acceptance of chronic pain - spend time with positive people - tell myself I can (not I can't) - think of new ways to do favorite things    Why is this important?   Stress makes chronic pain feel worse.  Feelings like depression, anxiety, stress and anger can make your body more sensitive to pain.  Learning ways to cope with stress or depression may help you find some relief from the pain.     04-08-2021: The patient is still have considerable pain. The medications are helping some but not a lot. She was not tearful today but says there is no balance in her life and she is tired of feeling the way she does. 05-27-2021: The patient states she is still having a lot of pain and discomfort. Has missed several appointments. States the Lyrica is not helping but she is taking. She states her "stomach is so messed up". She was not tearful today but frustrated with herself and her chronic conditions. Emotional support, empathetic listening and support given. Will contact the clinical and admin staff at [facility] to assist with rescheduling of appointment with the pcp. Will also ask for pharm D support and LCSW support.      RNCM: Manage Chronic Pain       Timeframe:  Long-Range Goal Priority:  High Start Date:    03-08-2021                         Expected End Date:         06-21-2022              Follow Up Date 07-22-2021 - call for medicine refill 2 or 3 days before it runs out - develop a personal pain management plan - keep track of prescription refills - plan exercise or activity when pain is best controlled - prioritize tasks for the day - track times pain is worst  and when it is best - track what makes the pain worse and what makes it better - work slower and less intense when having pain    Why is this important?   Day-to-day life can be hard when you have chronic pain.  Pain medicine is just one piece of the treatment puzzle.  You can try these action steps to help you manage your pain.    Notes: Appointment with the pcp on 03-15-2021. 6-17-202: The patient heard from Dr. Holley Raring office but does not remember appointment or anything like making an appointment. Supplied the number to Dr. Holley Raring office at [phone] for the patient to call after the call with the [facility]. Expressed the need to talk to the pain clinic and get established for help with pain management. 05-27-2021: The patient states she is no better. Admits that she has missed several doctor appointments and it  is her fault because of her memory and forgetting. She missed her pcp appointment and rescheduled it but missed it again as she thought she was COVID positive again. She said it was her failure to call and tell them she could not come. The patient is unable to take the cymbalta due to it breaking her out and making her very sick. Will collaborate with the pcp and pharm D. The patient has missed orthopedic appointments as well and does not know how to reach them. Advised her to call her RA provider to get assistance with referral numbers. The patient verbalized understanding.      SW-Track and Manage My Symptoms-Depression       Timeframe:  Long-Range Goal Priority:  Medium Start Date:    01/14/21                         Expected End Date:   04/16/21                    Follow Up Date -02/28/21   - avoid negative self-talk - develop a personal safety plan - develop a plan to deal with triggers like holidays, anniversaries - exercise at least 2 to 3 times per week - have a plan for how to handle bad days - journal feelings and what helps to feel better or worse - spend time or talk with  others at least 2 to 3 times per week - spend time or talk with others every day - watch for early signs of feeling worse - write in journal every day    Why is this important?   Keeping track of your progress will help your treatment team find the right mix of medicine and therapy for you.  Write in your journal every day.  Day-to-day changes in depression symptoms are normal. It may be more helpful to check your progress at the end of each week instead of every day.    CARE PLAN ENTRY (see longitudinal plan of care for additional care plan information)  Current Barriers:  Patient with Depression and Dementia in need of assistance with connection to community resources  Knowledge deficits and need for support, education and care coordination related to community resources support  Level of care concerns, ADL IADL limitations, Mental Health Concerns , Social Isolation, and Limited access to caregiver  Clinical Goal(s)  Over the next 120 days patient will be able to keep up with medical appointments, take medications as prescribed and implement appropriate and healthy self-care into her daily routine as instructed by CCM LCSW  Interventions provided by LCSW:  Assessed patient's care coordination needs related to care coordination, managing health care and discussed ongoing care management follow up  Provided patient with information about healthy self-care Advised patient to contact [facility] to schedule PCP appointment as she is struggling with chronic pain and memory loss and could use a referral to a neurologist  Collaborated with appropriate clinical care team members regarding patient needs Discussed plans with patient for ongoing care management follow up and provided patient with direct contact information for care management team Assisted patient/caregiver with obtaining information about health plan benefits Provided education and assistance to client regarding Advanced  Directives. Provided education to patient/caregiver regarding level of care options. Provided education to patient/caregiver about Hospice and/or Palliative Care services Motivational Interviewing Solution-Focused Strategies Mindfulness or Relaxation Training Brief CBT Emotional/Supportive Counseling interventions implemented   Patient admits that she has  missed several scheduled medical appointments and will sit down with sister today to re-schedule appointments. Daughter can provide stable transportation for patient to these and can attend these appointments with her as well.  LCSW discussed coping skills for anxiety. SW used empathetic and active and reflective listening, validated patient's feelings/concerns, and provided emotional support. LCSW provided self-care education to help manage her multiple health conditions and improve her mood. Development of long term plan of care and institution of self health management strategies completed. LCSW provided education on relaxation techniques such as meditation, deep breathing, massage, grounding exercsies or yoga that can activate the body's relaxation response and ease symptoms of stress and anxiety.  Patient was informed that current CCM LCSW will be leaving position next month and her next CCM Social Work follow up visit will be with another LCSW. Patient was appreciative of support provided and receptive to news Patient's SOB has increased. She reports that her SOB is constant at this point.   Patient Self Care Activities & Deficits:  Patient is unable to independently navigate and manage her health care alone Acknowledges deficits and is motivated to resolve concern  Patient is able to contact medical providers to schedule appointments  as discussed today           Patient verbalizes understanding of instructions provided today and agrees to view in [place].   Telephone follow up appointment with care management team member scheduled  for: 07-22-2021 at 1 pm  Noreene Larsson RN, MSN, [facility] Mobile: [phone]

## 2021-05-27 NOTE — Telephone Encounter (Signed)
Attempted to reach patient regarding appointment unable to leave message.

## 2021-05-27 NOTE — Telephone Encounter (Signed)
-----   Message from Vanita Ingles sent at 05/27/2021  1:34 PM EDT ----- Regarding: call to get a new appointment Hello, The patient has missed last 2 appointments. She has her husband helping her to try to remember appointments now. Please call and reschedule her an appointment with Jolene. She is leaving next Thursday to go out of town. She says Tuesdays are her best days for appointments because her husband has changed his schedule to help help. Thanks for your help. Pam

## 2021-05-27 NOTE — Chronic Care Management (AMB) (Signed)
Chronic Care Management   CCM RN Visit Note  05/27/2021 Name: Quinlynn Cuthbert MRN: [id number] DOB: September 07, 1971  Subjective: Sia Gabrielsen is a 50 y.o. year old female who is a primary care patient of Cannady, Barbaraann Faster, NP. The care management team was consulted for assistance with disease management and care coordination needs.    Engaged with patient by telephone for follow up visit in response to provider referral for case management and/or care coordination services.   Consent to Services:  The patient was given information about Chronic Care Management services, agreed to services, and gave verbal consent prior to initiation of services.  Please see initial visit note for detailed documentation.   Patient agreed to services and verbal consent obtained.   Assessment: Review of patient past medical history, allergies, medications, health status, including review of consultants reports, laboratory and other test data, was performed as part of comprehensive evaluation and provision of chronic care management services.   SDOH (Social Determinants of Health) assessments and interventions performed:  SDOH Interventions    Flowsheet Row Most Recent Value  SDOH Interventions   Stress Interventions Other (Comment)  [patient is stressed out about her memory loss and forgetting appointments and her health conditions]  Social Connections Interventions Other (Comment)  [her family is trying to help her manage her appointments better]        CCM Care Plan  Allergies  Allergen Reactions   Azithromycin Anaphylaxis   Penicillins     Did it involve swelling of the face/tongue/throat, SOB, or low BP? Yes Did it involve sudden or severe rash/hives, skin peeling, or any reaction on the inside of your mouth or nose? No Did you need to seek medical attention at a hospital or doctor's office? No When did it last happen?       If all above answers are "NO", may proceed with cephalosporin use.     Shellfish Allergy     Outpatient Encounter Medications as of 05/27/2021  Medication Sig Note   albuterol (VENTOLIN HFA) 108 (90 Base) MCG/ACT inhaler Inhale 2 puffs into the lungs every 4 (four) hours as needed.     amLODipine (NORVASC) 5 MG tablet Take 1 tablet (5 mg total) by mouth daily.    Blood Glucose Monitoring Suppl (ONETOUCH VERIO) w/Device KIT Use to check blood sugar 3 times daily with goal <130 fasting and <180 two hours after meal.  Bring meter to visits.    Cholecalciferol 1.25 MG (50000 UT) TABS Take 1 tablet by mouth once a week. For 8 weeks and then stop.  Return to office for lab draw.    cyproheptadine (PERIACTIN) 4 MG tablet Take 1 tablet (4 mg total) by mouth at bedtime. (Patient not taking: No sig reported)    dapagliflozin propanediol (FARXIGA) 10 MG TABS tablet Take 1 tablet (10 mg total) by mouth daily before breakfast.    DULoxetine (CYMBALTA) 30 MG capsule Start out with 30 mg (one tablet) once daily by mouth for 1 week, then increase to 60 mg (two tablets) once daily by mouth. (Patient not taking: Reported on 05/27/2021) 05/27/2021: Patient states she has stopped taking, broke her out and she stopped taking, started taking again and made her very sick x 2 days. Is not longer taking.    fexofenadine (ALLEGRA ALLERGY) 180 MG tablet Take 1 tablet (180 mg total) by mouth daily.    fluticasone (FLONASE) 50 MCG/ACT nasal spray Place 2 sprays into both nostrils daily.    Fluticasone-Umeclidin-Vilant (TRELEGY ELLIPTA)  100-62.5-25 MCG/INH AEPB Inhale 1 puff into the lungs daily.    gabapentin (NEURONTIN) 100 MG capsule Take 1 capsule (100 mg total) by mouth 3 (three) times daily.    glucose blood test strip Use to check blood sugar 3 times daily with goal <130 fasting and <180 two hours after meal.    ibuprofen (ADVIL) 400 MG tablet Take by mouth.    ipratropium-albuterol (DUONEB) 0.5-2.5 (3) MG/3ML SOLN Take 3 mLs by nebulization every 6 (six) hours as needed for up to 7 days.     Lactobacillus PACK Take 1 each by mouth as needed (for diarrhea.).    Lancets (ONETOUCH ULTRASOFT) lancets Use to check blood sugar 3 times daily with goal <130 fasting and <180 two hours after meal.    leflunomide (ARAVA) 20 MG tablet Take 20 mg by mouth daily.    levothyroxine (SYNTHROID) 50 MCG tablet Take 50 mcg by mouth daily before breakfast.     liothyronine (CYTOMEL) 5 MCG tablet Take 5 mcg by mouth daily.    losartan (COZAAR) 100 MG tablet Take 100 mg by mouth daily.     metFORMIN (GLUCOPHAGE) 500 MG tablet Take 1 tablet (500 mg total) by mouth 2 (two) times daily with a meal.    montelukast (SINGULAIR) 10 MG tablet Take 10 mg by mouth at bedtime.    nicotine (NICOTINE STEP 1) 21 mg/24hr patch Place 1 patch (21 mg total) onto the skin daily.    tiZANidine (ZANAFLEX) 4 MG tablet Take 1 tablet (4 mg total) by mouth every 8 (eight) hours as needed for muscle spasms.    triamcinolone cream (KENALOG) 0.1 % Apply 1 application topically 2 (two) times daily.    [DISCONTINUED] cetirizine (ZYRTEC) 10 MG tablet Take 1 tablet (10 mg total) by mouth daily.    [DISCONTINUED] traZODone (DESYREL) 50 MG tablet Take 1 tablet (50 mg total) by mouth at bedtime.    No facility-administered encounter medications on file as of 05/27/2021.    Patient Active Problem List   Diagnosis Date Noted   Depression, major, single episode, mild ([place]) 01/24/2021   Memory changes 10/12/2020   Dairy allergy 10/12/2020   Headache 10/12/2020   Vaginal stenosis 03/19/2020   Obesity 03/19/2020   Insomnia 01/05/2020   Complex regional pain syndrome I of upper limb 12/17/2019   Hypertension associated with diabetes ([place]) 12/13/2019   Centrilobular emphysema ([place]) 12/13/2019   Type 2 diabetes mellitus with obesity ([place]) 12/13/2019   Hypothyroid 12/13/2019   DDD (degenerative disc disease), lumbar 08/26/2019   Multinodular goiter 08/14/2019   Chronic left hip pain 05/05/2019   Osteoarthritis of spine with radiculopathy,  cervical region 05/05/2019   Fibromyalgia 02/10/2019   Rheumatoid arthritis involving multiple sites with positive rheumatoid factor ([place]) 02/10/2019   Undifferentiated connective tissue disease ([place]) 02/10/2019   Cervical radiculopathy 03/13/2018   Vitamin D deficiency 10/11/2015    Conditions to be addressed/monitored:HTN, Depression, and RA with chronic pain issues and memory loss   Care Plan : RNCM: Depression (Adult)  Updates made by Vanita Ingles since 05/27/2021 12:00 AM     Problem: RNCM: Harm or Injury (Depression)   Priority: High     Long-Range Goal: RNCM: Harm or Injury Prevented- depression   Start Date: 03/08/2021  Expected End Date: 03/08/2022  This Visit's Progress: On track  Priority: High  Note:   Current Barriers:  Knowledge Deficits related to resources for effective management of depression, depression worse due to severe pain and unable to  take medication that was prescribed for depression Care Coordination needs related to effective management of depression exacerbation  in a patient with severe pain causing depression symptoms to be exacerbated Chronic Disease Management support and education needs related to depression with a patient with multiple chronic conditions including sever pain Lacks caregiver support.  Non-adherence to scheduled provider appointments Non-adherence to prescribed medication regimen Unable to independently manage depression due to severe pain and unable to take Cymbalta due to side effect of rash. Stopped taking the medication but did not notify the provider.  Unable to self administer medications as prescribed Does not attend all scheduled provider appointments Does not adhere to prescribed medication regimen Lacks social connections Does not contact provider office for questions/concerns  Nurse Case Manager Clinical Goal(s):  patient will verbalize understanding of plan for effective management of depression  patient will work  with [place], CCM team and pcp to address needs related to effective management of depression patient will demonstrate a decrease in depression  exacerbations as evidenced by finding medication to help with depression, working with CCM team to optimize mental health and well being.  patient will attend all scheduled medical appointments: 05-11-2021 at [facility] am, 05-27-2021: Missed several appointments including x 2 to the pcp office. Agrees today to talk to the office to secure a new appointment with pcp patient will experience decrease in ED visits. ED visits in last 6 months = 6, 1 actuall ED, 5 urgent care patient will work with CM team pharmacist to assist with medication side effects, education, and reconciliation  patient will work with CM clinical social worker to worsening depression due to uncontrolled pain and discomfort. 04-08-2021: Currently working with the LCSW for ongoing support and assistance   Interventions:  1:1 collaboration with Venita Lick, NP regarding development and update of comprehensive plan of care as evidenced by provider attestation and co-signature Inter-disciplinary care team collaboration (see longitudinal plan of care) Evaluation of current treatment plan related to depression  and patient's adherence to plan as established by provider. The patient is currently having an exacerbation of her depression and was very tearful. Empathetic listening and support given. Encouraged the patient to reach out for help if she felt hopeless or had thoughts of hurting self. 04-08-2021: The patient is feeling better today but still having issues with pain and depression. She just wants to have balance in her life. She states she talked to someone about her appointment with pain clinic but does not remember if an appointment was made. Gave her the information to call the pain clinic. The patient verbalized understanding. Empathetic listening and support given.  05-27-2021: The patient is very  frustrated with herself and her healthy conditions. She has missed multiple appointments due to her memory and not remember what she needs to do and when she needs to go. She wants to feel better. Her husband has changed his schedule so he can help her more with the management of her conditions. She says her family is trying to help her get on track with her appointments and managing her health. The patient was not tearful today. Expressed thankfulness for RNCM call. Will work with the office to get a new appointment for follow up with the pcp.  Advised patient to call the office for changes or questions, to be available when the office calls to make appointment with pcp (appointment secured for 03-15-2021 at 0940 am) to seek emergent help for suicidal ideation or thoughts of hurting self or others.  05-27-2021: The patient remains non-compliant with appointments. Has missed several. She denies SI and the patient states that she is trying to do better. Her family is on board with helping her with her appointments.  Provided education to patient re: the need to find a medication that would work well for her depression that does not cause side effects. The patient stopped taking the Cymbalta due to a rash. She says her depression is worse due to the severe pain and discomfort she is experiencing. 04-08-2021: The patient is not tearful today. States compliance with medications and they are "trying" to help but her pain level is so bad. She is hopeful the pain clinic can assist her and help her with management of her RA pain. She feels this will help improve her depressive state. Denies any SI at this time. Will continue to monitor. 05-27-2021: The patient is not taking the Cymbalta. She tried it but it broke her out, she stopped it and then started it again and it made her "very sick" x 2 days so she is not taking it again. Talked to her about the importance of keeping her appointments that the providers can not help her if  she does not follow up with the providers. The patient verbalized understanding. Will collaborate with the whole CCM team and pcp for assistance with the patient in meeting her needs.  Reviewed medications with patient and discussed compliance. The patient is not taking any medications for depression at this time. 04-08-2021: States compliance with the medications at this time. 05-27-2021: Is currently not taking the cymbalta, is taking Lyrica but states it is not effective in helping her pain level. She says her stomach is "so tore up", will consult with pharm D for assistance with medications management.  Collaborated with pcp, CCM team  regarding exacerbation of depression and the need to see pcp for evaluation and treatment. 05-27-2021: Will collaborate with the pcp and CCM team to get needed help to meet the patients needs.  Reviewed scheduled/upcoming provider appointments including: 05-11-2021 at [place] am. 05-27-2021: The patient states she has missed several appointments and knows she has not followed up appropriate. Agrees to get a new appointment with the pcp and have the admin staff call for a new appointment with the pcp.  Social Work referral for exacerbation of depression. 04-08-2021: The patient is actively working with the LCSW at this time for ongoing support and recommendations.  Pharmacy referral for medication side effects and medication reconciliation. Discussed plans with patient for ongoing care management follow up and provided patient with direct contact information for care management team  Patient Goals/Self-Care Activities Over the next 120 days, patient will:  - Patient will self administer medications as prescribed Patient will attend all scheduled provider appointments Patient will call pharmacy for medication refills Patient will attend church or other social activities Patient will continue to perform ADL's independently Patient will continue to perform IADL's  independently Patient will call provider office for new concerns or questions Patient will work with BSW to address care coordination needs and will continue to work with the clinical team to address health care and disease management related needs.   Seek emergent care for suicidal ideation or thoughts of harming self or others - action plan for worsening symptoms mutually developed - barriers to safety identified and addressed - emergency contact information provided - family/caregiver monitoring of patient's emotional state encouraged - modification of home and work environment promoted - Nurse, children's plan developed -  provision of safe home environment by family/caregiver encouraged - risk of self-injury assessed - side effects of medication monitored - suicidal or self-injurious tendencies explored  Follow Up Plan: Telephone follow up appointment with care management team member scheduled for:  07-22-2021 at 1 pm       Task: RNCM: Depression Completed 05/27/2021  Outcome: Positive  Note:   Care Management Activities:    - action plan for worsening symptoms mutually developed - barriers to safety identified and addressed - emergency contact information provided - family/caregiver monitoring of patient's emotional state encouraged - modification of home and work environment promoted - Nurse, children's plan developed - provision of safe home environment by family/caregiver encouraged - risk of self-injury assessed - side effects of medication monitored - suicidal or self-injurious tendencies explored        Care Plan : RNCM: Chronic Pain (Adult)  Updates made by Vanita Ingles since 05/27/2021 12:00 AM     Problem: RNCM: Pain Management Plan (Chronic Pain) left chest pain, RA, Fibromyalgia   Priority: High     Long-Range Goal: RNCM: Pain Management Plan Developed   Start Date: 03/08/2021  Expected End Date: 03/08/2022  This Visit's Progress: Not on track  Priority:  High  Note:   Current Barriers:  Knowledge Deficits related to managing acute/chronic pain Non-adherence to scheduled provider appointments Non-adherence to prescribed medication regimen Difficulty obtaining medications Chronic Disease Management support and education needs related to chronic pain Unable to independently manage pain and discomfort as evidence of pain level at >10 today Unable to self administer medications as prescribed Does not attend all scheduled provider appointments Does not adhere to prescribed medication regimen Lacks social connections Does not contact provider office for questions/concerns Nurse Case Manager Clinical Goal(s):  patient will verbalize understanding of plan for managing pain patient will talk with RN Care Manager to address new concerns of pain and discomfort and help with exacerbation of pain and discomfort  patient will attend all scheduled medical appointments: 05-11-2021 at 0940 am- needs a new appointment. Staff to call for an appointment  patient will demonstrate use of different relaxation  skills and/or diversional activities to assist with pain reduction (distraction, imagery, relaxation, massage, acupressure, TENS, heat, and cold application patient will report pain at a level less than 3 to 4 on a 10-10 rating scale patient will use pharmacological and nonpharmacological pain relief strategies patient will verbalize acceptable level of pain relief and ability to engage in desired activities patient will engage in desired activities without an increase in pain level Interventions:  Collaboration with Marnee Guarneri T, NP regarding development and update of comprehensive plan of care as evidenced by provider attestation and co-signature Inter-disciplinary care team collaboration (see longitudinal plan of care) - deep breathing, relaxation and mindfulness use promoted - effectiveness of pharmacologic therapy monitored - medication-induced  side effects managed - misuse of pain medication assessed - motivation and barriers to change assessed and addressed - mutually acceptable comfort goal set - pain assessed- 05-27-2021: The patient rates her pain as "uncontrolled", she has been taking Lyrica but does not see where it is helping. Had an infusion and it did not help but is having another one. The patient has missed several appointments and knows she has to follow up to get help.  - pain treatment goals reviewed - premedication prior to activity encouraged Evaluation of current treatment plan related to pain: RA, fibromyalgia, and left chest pain (patient seen in ER and cardiac issues ruled out)  and patient's adherence to plan as established by provider. 04-08-2021: The patient is still having issues with pain. She is concerned that the medications are causing diarrhea. She does not know what is going on. She states she is taking Farxiga 10 mg, and the duloxetine and gabapentin, along with tramadol. She states that she can tell it is "trying" to work. When inquiring about the pain clinic the patient states she talked to someone but her memory is foggy and she cannot remember is an appointment was made. Review of upcoming appointments and no appointments noted. Did give the number to Dr. Holley Raring office at [phone] and ask the patient to call and get an appointment with Dr. Holley Raring. The patient was going to call after the call with the [facility]. 05-27-2021: The patient states that she has missed several appointments and it is her fault.  She states she cannot remember things and she is overwhelmed with her health conditions. Suggested writing down her appointments or putting reminders on her phone. She states her family is trying to help her and her husband has even changed his work schedule so that he can help her. She wants to feel better. Discussed options and recommended that she call the specialist office and get numbers to call for the referrals to  orthopedic providers and other referrals they have made and she has not gone to.  Advised patient to call the office for worsening level or intensity of pain and to seek emergent care for unresolved pain and discomfort  Provided education to patient re: safety concerns, working with the CCM team and pcp, alternative pain relief methods.  Reviewed medications with patient and discussed compliance, the patient is supposed to start infusions soon for her RA. She saw specialist last week and she is waiting on an appointment for infusion. 05-27-2021: The patient had the infusion but can not see where it helped. She is scheduled for a new infusion and is mindful of this appointment. The Lyrica she is taking but states she can not see where it has helped. Reflective listening and support given.  Collaborated with pcp, CCM team regarding Chronic pain and discomfort  Discussed plans with patient for ongoing care management follow up and provided patient with direct contact information for care management team Allow patient to maintain a diary of pain ratings, timing, precipitating events, medications, treatments, and what works best to relieve pain,  Refer to support groups and self-help groups Educate patient about the use of pharmacological interventions for pain management- antianxiety, antidepressants, NSAIDS, opioid analgesics,  Explain the importance of lifestyle modifications to effective pain management  Patient Goals/Self Care Activities:  - mutually acceptable comfort goal set - pain assessed - pain management plan developed - pain treatment goals reviewed - patient response to treatment assessed - sharing of pain management plan with teachers and other caregivers encouraged Self-administers medications as prescribed Attends all scheduled provider appointments Calls pharmacy for medication refills Calls provider office for new concerns or questions Follow Up Plan: Telephone follow up appointment  with care management team member scheduled for: 07-22-2021 at 1 pm     Task: RNCM: Partner to Develop Chronic Pain Management Plan Completed 05/27/2021  Outcome: Positive  Note:   Care Management Activities:    - mutually acceptable comfort goal set - pain assessed - pain management plan developed - pain treatment goals reviewed - patient response to treatment assessed - sharing of pain management plan with teachers and other caregivers encouraged  Care Plan : RNCM: Hypertension (Adult)  Updates made by Vanita Ingles since 05/27/2021 12:00 AM     Problem: RNCM: Hypertension (Hypertension)   Priority: Medium     Long-Range Goal: RNCM: Hypertension Monitored   Start Date: 03/08/2021  Expected End Date: 05/22/2022  This Visit's Progress: On track  Priority: Medium  Note:   Objective:  Last practice recorded BP readings:  BP Readings from Last 3 Encounters:  03/16/21 138/85  03/06/21 (!) 171/98  02/20/21 (!) 145/90     Most recent eGFR/CrCl:  Lab Results  Component Value Date   EGFR 106 01/24/2021    No components found for: CRCL Current Barriers:  Knowledge Deficits related to basic understanding of hypertension pathophysiology and self care management Knowledge Deficits related to understanding of medications prescribed for management of hypertension Non-adherence to prescribed medication regimen Non-adherence to scheduled provider appointments Limited Social Support Unable to independently manage HTN Does not adhere to prescribed medication regimen Lacks social connections Does not contact provider office for questions/concerns Case Manager Clinical Goal(s):  patient will verbalize understanding of plan for hypertension management patient will attend all scheduled medical appointments: 05-11-2021- missed appointments x 2, needs new appointment, staff message sent patient will demonstrate improved adherence to prescribed treatment plan for hypertension as  evidenced by taking all medications as prescribed, monitoring and recording blood pressure as directed, adhering to low sodium/DASH diet patient will demonstrate improved health management independence as evidenced by checking blood pressure as directed and notifying PCP if SBP>150 or DBP > 60, taking all medications as prescribe, and adhering to a low sodium diet as discussed. patient will verbalize basic understanding of hypertension disease process and self health management plan as evidenced by compliance with heart healthy diet, compliance with medications and working with the CCM team to manage health and well being.  Interventions:  Collaboration with Venita Lick, NP regarding development and update of comprehensive plan of care as evidenced by provider attestation and co-signature Inter-disciplinary care team collaboration (see longitudinal plan of care) UNABLE to independently:manage HTN Evaluation of current treatment plan related to hypertension self management and patient's adherence to plan as established by provider. 05-27-2021: The patient states her blood pressure is up when she is in pain. The patient states that she wants to have a balance in her health. Empathetic listening and support given. Will continue to monitor.  Provided education to patient re: stroke prevention, s/s of heart attack and stroke, DASH diet, complications of uncontrolled blood pressure Reviewed medications with patient and discussed importance of compliance Discussed plans with patient for ongoing care management follow up and provided patient with direct contact information for care management team Advised patient, providing education and rationale, to monitor blood pressure daily and record, calling PCP for findings outside established parameters.  Reviewed scheduled/upcoming provider appointments including: 05-11-2021 Self-Care Activities: - Self administers medications as prescribed Attends all scheduled  provider appointments Calls provider office for new concerns, questions, or BP outside discussed parameters Checks BP and records as discussed Follows a low sodium diet/DASH diet Patient Goals: - check blood pressure daily - choose a place to take my blood pressure (home, clinic or office, retail store) - write blood pressure results in a log or diary - agree on reward when goals are met - agree to work together to make changes - ask questions to understand - have a family meeting to talk about healthy habits - learn about high blood pressure - blood pressure trends reviewed - depression  screen reviewed - home or ambulatory blood pressure monitoring encouraged Follow Up Plan: Telephone follow up appointment with care management team member scheduled for: 07-22-2021 at 1 pm    Task: RNCM: Identify and Monitor Blood Pressure Elevation Completed 05/27/2021  Outcome: Positive  Note:   Care Management Activities:    - blood pressure trends reviewed - depression screen reviewed - home or ambulatory blood pressure monitoring encouraged         Plan:Telephone follow up appointment with care management team member scheduled for:  07-22-2021 at 1 pm  [place], MSN, [facility] Mobile: [phone]

## 2021-05-30 NOTE — Telephone Encounter (Signed)
Mychart message sent to patient.

## 2021-05-31 ENCOUNTER — Encounter: Payer: Medicare Other | Admitting: Physician Assistant

## 2021-05-31 NOTE — Patient Instructions (Signed)
Nyeemah Magistro, thank you for joining [facility], PA-C for today's virtual visit.  While this provider is not your primary care provider (PCP), if your PCP is located in our provider database this encounter information will be shared with them immediately following your visit.  Consent: (Patient) Catherine Giles provided verbal consent for this virtual visit at the beginning of the encounter.  Current Medications:  Current Outpatient Medications:    albuterol (VENTOLIN HFA) 108 (90 Base) MCG/ACT inhaler, Inhale 2 puffs into the lungs every 4 (four) hours as needed. , Disp: , Rfl:    amLODipine (NORVASC) 5 MG tablet, Take 1 tablet (5 mg total) by mouth daily., Disp: 90 tablet, Rfl: 3   Blood Glucose Monitoring Suppl (ONETOUCH VERIO) w/Device KIT, Use to check blood sugar 3 times daily with goal <130 fasting and <180 two hours after meal.  Bring meter to visits., Disp: 1 kit, Rfl: 0   Cholecalciferol 1.25 MG (50000 UT) TABS, Take 1 tablet by mouth once a week. For 8 weeks and then stop.  Return to office for lab draw., Disp: 12 tablet, Rfl: 3   cyproheptadine (PERIACTIN) 4 MG tablet, Take 1 tablet (4 mg total) by mouth at bedtime. (Patient not taking: No sig reported), Disp: 30 tablet, Rfl: 5   dapagliflozin propanediol (FARXIGA) 10 MG TABS tablet, Take 1 tablet (10 mg total) by mouth daily before breakfast., Disp: 90 tablet, Rfl: 4   DULoxetine (CYMBALTA) 30 MG capsule, Start out with 30 mg (one tablet) once daily by mouth for 1 week, then increase to 60 mg (two tablets) once daily by mouth. (Patient not taking: Reported on 05/27/2021), Disp: 180 capsule, Rfl: 4   fexofenadine (ALLEGRA ALLERGY) 180 MG tablet, Take 1 tablet (180 mg total) by mouth daily., Disp: 15 tablet, Rfl: 0   fluticasone (FLONASE) 50 MCG/ACT nasal spray, Place 2 sprays into both nostrils daily., Disp: 16 g, Rfl: 0   Fluticasone-Umeclidin-Vilant (TRELEGY ELLIPTA) 100-62.5-25 MCG/INH AEPB, Inhale 1 puff into the lungs daily.,  Disp: , Rfl:    gabapentin (NEURONTIN) 100 MG capsule, Take 1 capsule (100 mg total) by mouth 3 (three) times daily., Disp: 270 capsule, Rfl: 4   glucose blood test strip, Use to check blood sugar 3 times daily with goal <130 fasting and <180 two hours after meal., Disp: 100 each, Rfl: 12   ibuprofen (ADVIL) 400 MG tablet, Take by mouth., Disp: , Rfl:    ipratropium-albuterol (DUONEB) 0.5-2.5 (3) MG/3ML SOLN, Take 3 mLs by nebulization every 6 (six) hours as needed for up to 7 days., Disp: 360 mL, Rfl: 0   Lactobacillus PACK, Take 1 each by mouth as needed (for diarrhea.)., Disp: 20 each, Rfl: 0   Lancets (ONETOUCH ULTRASOFT) lancets, Use to check blood sugar 3 times daily with goal <130 fasting and <180 two hours after meal., Disp: 100 each, Rfl: 12   leflunomide (ARAVA) 20 MG tablet, Take 20 mg by mouth daily., Disp: , Rfl:    levothyroxine (SYNTHROID) 50 MCG tablet, Take 50 mcg by mouth daily before breakfast. , Disp: , Rfl:    liothyronine (CYTOMEL) 5 MCG tablet, Take 5 mcg by mouth daily., Disp: , Rfl:    losartan (COZAAR) 100 MG tablet, Take 100 mg by mouth daily. , Disp: , Rfl:    metFORMIN (GLUCOPHAGE) 500 MG tablet, Take 1 tablet (500 mg total) by mouth 2 (two) times daily with a meal., Disp: 180 tablet, Rfl: 3   montelukast (SINGULAIR) 10 MG tablet, Take  10 mg by mouth at bedtime., Disp: , Rfl:    nicotine (NICOTINE STEP 1) 21 mg/24hr patch, Place 1 patch (21 mg total) onto the skin daily., Disp: 28 patch, Rfl: 0   tiZANidine (ZANAFLEX) 4 MG tablet, Take 1 tablet (4 mg total) by mouth every 8 (eight) hours as needed for muscle spasms., Disp: 90 tablet, Rfl: 0   triamcinolone cream (KENALOG) 0.1 %, Apply 1 application topically 2 (two) times daily., Disp: 30 g, Rfl: 0   Medications ordered in this encounter:  No orders of the defined types were placed in this encounter.    *If you need refills on other medications prior to your next appointment, please contact your  pharmacy*  Follow-Up: Call back or seek an in-person evaluation if the symptoms worsen or if the condition fails to improve as anticipated.   If you have been instructed to have an in-person evaluation today at a local Urgent Care facility, please use the link below. It will take you to a list of all of our available [facility], including address, phone number and hours of operation. Please do not delay care.  [facility]  If you or a family member do not have a primary care provider, use the link below to schedule a visit and establish care. When you choose a [facility] primary care physician or advanced practice provider, you gain a long-term partner in health. Find a Primary Care Provider  Learn more about [facility]'s in-office and virtual care options: [facility]

## 2021-05-31 NOTE — Progress Notes (Signed)
[name], matto are scheduled for a virtual visit with your provider today.    Just as we do with appointments in the office, we must obtain your consent to participate.  Your consent will be active for this visit and any virtual visit you may have with one of our providers in the next 365 days.    If you have a MyChart account, I can also send a copy of this consent to you electronically.  All virtual visits are billed to your insurance company just like a traditional visit in the office.  As this is a virtual visit, video technology does not allow for your provider to perform a traditional examination.  This may limit your provider's ability to fully assess your condition.  If your provider identifies any concerns that need to be evaluated in person or the need to arrange testing such as labs, EKG, etc, we will make arrangements to do so.    Although advances in technology are sophisticated, we cannot ensure that it will always work on either your end or our end.  If the connection with a video visit is poor, we may have to switch to a telephone visit.  With either a video or telephone visit, we are not always able to ensure that we have a secure connection.   I need to obtain your verbal consent now.   Are you willing to proceed with your visit today?   Catherine Giles has provided verbal consent on 05/31/2021 for a virtual visit (video or telephone).   Rodney Booze, PA-C 05/31/2021  3:47 PM  Catherine Giles,you are scheduled for a virtual visit with your provider today.    Just as we do with appointments in the office, we must obtain your consent to participate.  Your consent will be active for this visit and any virtual visit you may have with one of our providers in the next 365 days.    If you have a MyChart account, I can also send a copy of this consent to you electronically.  All virtual visits are billed to your insurance company just like a traditional visit in the office.  As this is a virtual  visit, video technology does not allow for your provider to perform a traditional examination.  This may limit your provider's ability to fully assess your condition.  If your provider identifies any concerns that need to be evaluated in person or the need to arrange testing such as labs, EKG, etc, we will make arrangements to do so.    Although advances in technology are sophisticated, we cannot ensure that it will always work on either your end or our end.  If the connection with a video visit is poor, we may have to switch to a telephone visit.  With either a video or telephone visit, we are not always able to ensure that we have a secure connection.   I need to obtain your verbal consent now.   Are you willing to proceed with your visit today?   Catherine Giles has provided verbal consent on 05/31/2021 for a virtual visit (video or telephone).   Rodney Booze, PA-C 05/31/2021  3:47 PM   Date:  05/31/2021   ID:  Catherine Giles, DOB [birth year], MRN [id number]  Patient Location: Home Provider Location: Home Office   Participants: Patient and Provider for Visit and Wrap up  Method of visit: Video  Location of Patient: Home Location of Provider: Home Office Consent was obtain for visit  over the video. Services rendered by provider: Visit was performed via video  A video enabled telemedicine application was used and I verified that I am speaking with the correct person using two identifiers.  PCP:  Venita Lick, NP   Chief Complaint:  gyn issue  History of Present Illness:    Catherine Giles is a 50 y.o. female with history as stated below. Presents video telehealth for an acute care visit  Pt states that her partner has had trichomonas in the past and she was treated for this. She recently had used an intravaginal pill for an infection and had intercourse with her partner before it was completely dissolved. She states he was later diagnosed with a yeast infection. She would like to  know if it is possible to for him to have gotten a yeast infection from this encounter.   During attempts to clarify the patients question and whether she was having symptoms or had personal concerns for an infection she hung up the visit. I was unable to provide recommendations.   Past Medical History:  Diagnosis Date   Arthritis    RA   Asthma    COPD (chronic obstructive pulmonary disease) ([place])    Depression    Diabetes mellitus without complication ([place])    Emphysema of lung ([place])    Family history of breast cancer    6/21 cancer genetic testing letter sent   Fibromyalgia    Hypertension    Lupus ([place])    Pre-diabetes    Thyroid disease     No outpatient medications have been marked as taking for the 05/31/21 encounter (Video Visit) with [place].     Allergies:   Azithromycin, Penicillins, and Shellfish allergy   ROS See HPI for history of present illness.  Physical Exam  MDM: During attempts to clarify the patients question and whether she was having symptoms or had personal concerns for an infection she hung up the visit. I was unable to provide recommendations.            There are no diagnoses linked to this encounter.   Time:   Today, I have spent 15 minutes with the patient with telehealth technology discussing the above problems, reviewing the chart, previous notes, medications and orders.    Tests Ordered: No orders of the defined types were placed in this encounter.   Medication Changes: No orders of the defined types were placed in this encounter.    Disposition:  Follow up  Signed, Rodney Booze, PA-C  05/31/2021 3:47 PM

## 2021-06-01 ENCOUNTER — Telehealth: Payer: Self-pay

## 2021-06-01 ENCOUNTER — Ambulatory Visit: Payer: Self-pay | Admitting: *Deleted

## 2021-06-01 NOTE — Chronic Care Management (AMB) (Signed)
  Chronic Care Management   Note  06/01/2021 Name: Cassidi Guymon MRN: [id number] DOB: May 06, 1971  Arna Berrios is a 50 y.o. year old female who is a primary care patient of Cannady, Barbaraann Faster, NP. Jayliani Pedalino is currently enrolled in care management services. An additional referral for RN CM and Pharm D  was placed.   Follow up plan: Unsuccessful telephone outreach attempt made. A HIPAA compliant phone message was left for the patient providing contact information and requesting a return call.  The care management team will reach out to the patient again over the next 7 days.  If patient returns call to provider office, please advise to call [facility]  at [place], [place], [place], [place] [address] Direct Dial: [phone] [email or web address]: [email or web address]

## 2021-06-01 NOTE — Telephone Encounter (Signed)
Reason for Disposition  Systolic BP  >= 99991111 OR Diastolic >= A999333  Answer Assessment - Initial Assessment Questions 1. BLOOD PRESSURE: "What is the blood pressure?" "Did you take at least two measurements 5 minutes apart?"     167/107 . 150/92 during call 2. ONSET: "When did you take your blood pressure?"     5am 3. HOW: "How did you obtain the blood pressure?" (e.g., visiting nurse, automatic home BP monitor)    Home wrist 4. HISTORY: "Do you have a history of high blood pressure?"     Yes 5. MEDICATIONS: "Are you taking any medications for blood pressure?" "Have you missed any doses recently?"     Do not take amlodipine every day, sometimes it runs low. 6. OTHER SYMPTOMS: "Do you have any symptoms?" (e.g., headache, chest pain, blurred vision, difficulty breathing, weakness)     Headache, blurred vision  Protocols used: Blood Pressure - High-A-AH

## 2021-06-01 NOTE — Telephone Encounter (Signed)
Pt reports BP at 5am this morning 167/107. States previous days 153/99 and 146/88. During call 150/92. Reports headache, 6/10. Has not taken amlodipine today, "I don't take it everyday, why would I if my blood pressure is low?"  Questioning why she was taken off losartan. Med is still on current med profile, last filled 2020 by historical provider. Pt seems reluctant to triage. Appt made for tomorrow with Ander Purpura. Advised to take BP meds today, as she has not done so yet.  Advised ED if headache worsens. Care advise per protocol. Pt verbalizes understanding.

## 2021-06-02 ENCOUNTER — Encounter: Payer: Self-pay | Admitting: Nurse Practitioner

## 2021-06-02 ENCOUNTER — Telehealth: Payer: Self-pay

## 2021-06-02 ENCOUNTER — Ambulatory Visit: Payer: Medicare Other | Admitting: Obstetrics & Gynecology

## 2021-06-02 ENCOUNTER — Ambulatory Visit (INDEPENDENT_AMBULATORY_CARE_PROVIDER_SITE_OTHER): Payer: Medicare Other | Admitting: Nurse Practitioner

## 2021-06-02 ENCOUNTER — Other Ambulatory Visit: Payer: Self-pay

## 2021-06-02 VITALS — BP 137/99 | HR 86 | Temp 98.5°F | Ht 62.99 in | Wt 169.4 lb

## 2021-06-02 DIAGNOSIS — E1169 Type 2 diabetes mellitus with other specified complication: Secondary | ICD-10-CM | POA: Diagnosis not present

## 2021-06-02 DIAGNOSIS — I152 Hypertension secondary to endocrine disorders: Secondary | ICD-10-CM | POA: Diagnosis not present

## 2021-06-02 DIAGNOSIS — R079 Chest pain, unspecified: Secondary | ICD-10-CM

## 2021-06-02 DIAGNOSIS — E669 Obesity, unspecified: Secondary | ICD-10-CM

## 2021-06-02 DIAGNOSIS — Z136 Encounter for screening for cardiovascular disorders: Secondary | ICD-10-CM

## 2021-06-02 DIAGNOSIS — E1159 Type 2 diabetes mellitus with other circulatory complications: Secondary | ICD-10-CM

## 2021-06-02 DIAGNOSIS — F320 Major depressive disorder, single episode, mild: Secondary | ICD-10-CM | POA: Diagnosis not present

## 2021-06-02 LAB — BAYER DCA HB A1C WAIVED: HB A1C (BAYER DCA - WAIVED): 6.3 % (ref <7.0)

## 2021-06-02 MED ORDER — LOSARTAN POTASSIUM 100 MG PO TABS: 100.0000 mg | ORAL_TABLET | Freq: Every day | ORAL | 0 refills | Status: DC

## 2021-06-02 MED ORDER — NICOTINE 21 MG/24HR TD PT24: 21.0000 mg | MEDICATED_PATCH | Freq: Every day | TRANSDERMAL | 0 refills | Status: DC

## 2021-06-02 NOTE — Patient Instructions (Signed)
Take the losartan and amlodipine once a day for your blood pressure. You can take these together. Take the metformin (glucophage) twice a day with food (breakfast and dinner). Duloxetine take daily at night, it may take 1-2 weeks for stomach pain to go away.

## 2021-06-02 NOTE — Assessment & Plan Note (Signed)
Chronic. Check A1C today. She is taking her metformin twice a day and farxiga. Continue regimen and will adjust treatment as needed. Follow-up in 3 months.

## 2021-06-02 NOTE — Progress Notes (Signed)
Established Patient Office Visit  Subjective:  Patient ID: Catherine Giles, female    DOB: [birth year]  Age: 50 y.o. MRN: [id number]  CC:  Chief Complaint  Patient presents with   Hypertension    Patient states that her BP has been elevated recently    HPI Catherine Giles presents for elevated blood pressure, diabetes, depression  HYPERTENSION Has not been taking blood pressure medicine for several months because her blood pressure was good. Restarted losartan about a week ago, has not been taking amlodipine.   Hypertension status: uncontrolled  Satisfied with current treatment? yes Duration of hypertension: chronic BP monitoring frequency:  daily BP range: 176/105 this morning BP medication side effects:  no Medication compliance: poor compliance Previous BP meds:amlodipine and losartan (cozaar) Aspirin: no Recurrent headaches: yes Visual changes: yes Palpitations: no Dyspnea: yes, chronic stable Chest pain:  3 days ago, not currently Lower extremity edema: no Dizzy/lightheaded: yes  DIABETES  Hypoglycemic episodes:no Polydipsia/polyuria: no Paresthesias: no Glucose Monitoring: yes  Accucheck frequency:Daily  Fasting glucose: 110s-120s  Post prandial:  Evening:  Before meals: Taking Insulin?: no  Long acting insulin:  Short acting insulin: Blood Pressure Monitoring: daily Retinal Examination: Up to Date Foot Exam: Up to Date Diabetic Education: Completed Pneumovax: Not up to Date Influenza: Not up to Date Aspirin: no  DEPRESSION  Had a rash 2 days after starting cymbalta, so she stopped taking it. She decided to start it again, and she didn't get a rash, but noticed she had a stomach ache, so she stopped taking it again.   Depression screen [facility] 2/9 06/02/2021 04/15/2021 03/08/2021 01/24/2021 10/12/2020  Decreased Interest 3 2 3 3  0  Down, Depressed, Hopeless 3 2 3 2  0  PHQ - 2 Score 6 4 6 5  0  Altered sleeping 3 3 2 3 3   Tired, decreased energy 3 3 3 3 3    Change in appetite 3 3 0 1 2  Feeling bad or failure about yourself  3 0 0 0 0  Trouble concentrating 3 1 3 3 2   Moving slowly or fidgety/restless 2 0 1 1 2   Suicidal thoughts 0 0 0 0 0  PHQ-9 Score 23 14 15 16 12   Difficult doing work/chores Very difficult Somewhat difficult Somewhat difficult Somewhat difficult Somewhat difficult    GAD 7 : Generalized Anxiety Score 06/02/2021 05/20/2019  Nervous, Anxious, on Edge 3 3  Control/stop worrying 3 2  Worry too much - different things 3 3  Trouble relaxing 3 3  Restless 2 1  Easily annoyed or irritable 3 3  Afraid - awful might happen 3 1  Total GAD 7 Score 20 16  Anxiety Difficulty Very difficult Somewhat difficult    Past Medical History:  Diagnosis Date   Arthritis    RA   Asthma    COPD (chronic obstructive pulmonary disease) ([place])    Depression    Diabetes mellitus without complication (HCC)    Emphysema of lung ([place])    Family history of breast cancer    6/21 cancer genetic testing letter sent   Fibromyalgia    Hypertension    Lupus ([place])    Pre-diabetes    Thyroid disease     Past Surgical History:  Procedure Laterality Date   MOUTH SURGERY      Family History  Problem Relation Age of Onset   Lung cancer Mother    Diabetes Mother    Breast cancer Mother 40   Hypertension Father  Cancer Maternal Aunt    Ovarian cancer Maternal Aunt    Diabetes Maternal Aunt    Diabetes Maternal Uncle    Hypertension Maternal Grandmother    Diabetes Maternal Grandmother     Social History   Socioeconomic History   Marital status: Divorced    Spouse name: Not on file   Number of children: Not on file   Years of education: Not on file   Highest education level: Not on file  Occupational History   Not on file  Tobacco Use   Smoking status: Every Day    Packs/day: 1.00    Years: 34.00    Pack years: 34.00    Types: Cigarettes   Smokeless tobacco: Never   Tobacco comments:    2-3 cig/day  Vaping Use   Vaping  Use: Never used  Substance and Sexual Activity   Alcohol use: Yes    Comment: Occ   Drug use: Not Currently   Sexual activity: Yes  Other Topics Concern   Not on file  Social History Narrative   ** Merged History Encounter **       Social Determinants of Health   Financial Resource Strain: Medium Risk   Difficulty of Paying Living Expenses: Somewhat hard  Food Insecurity: Food Insecurity Present   Worried About Charity fundraiser in the Last Year: Sometimes true   Ran Out of Food in the Last Year: Sometimes true  Transportation Needs: No Transportation Needs   Lack of Transportation (Medical): No   Lack of Transportation (Non-Medical): No  Physical Activity: Inactive   Days of Exercise per Week: 0 days   Minutes of Exercise per Session: 0 min  Stress: Stress Concern Present   Feeling of Stress : To some extent  Social Connections: Moderately Isolated   Frequency of Communication with Friends and Family: More than three times a week   Frequency of Social Gatherings with Friends and Family: More than three times a week   Attends Religious Services: Never   Marine scientist or Organizations: No   Attends Music therapist: Never   Marital Status: Married  Human resources officer Violence: Not At Risk   Fear of Current or Ex-Partner: No   Emotionally Abused: No   Physically Abused: No   Sexually Abused: No    Outpatient Medications Prior to Visit  Medication Sig Dispense Refill   amLODipine (NORVASC) 5 MG tablet Take 1 tablet (5 mg total) by mouth daily. 90 tablet 3   Blood Glucose Monitoring Suppl (ONETOUCH VERIO) w/Device KIT Use to check blood sugar 3 times daily with goal <130 fasting and <180 two hours after meal.  Bring meter to visits. 1 kit 0   cyproheptadine (PERIACTIN) 4 MG tablet Take 1 tablet (4 mg total) by mouth at bedtime. 30 tablet 5   dapagliflozin propanediol (FARXIGA) 10 MG TABS tablet Take 1 tablet (10 mg total) by mouth daily before  breakfast. 90 tablet 4   fexofenadine (ALLEGRA ALLERGY) 180 MG tablet Take 1 tablet (180 mg total) by mouth daily. 15 tablet 0   fluticasone (FLONASE) 50 MCG/ACT nasal spray Place 2 sprays into both nostrils daily. 16 g 0   Fluticasone-Umeclidin-Vilant (TRELEGY ELLIPTA) 100-62.5-25 MCG/INH AEPB Inhale 1 puff into the lungs daily.     glucose blood test strip Use to check blood sugar 3 times daily with goal <130 fasting and <180 two hours after meal. 100 each 12   ibuprofen (ADVIL) 400 MG tablet Take by  mouth.     Lactobacillus PACK Take 1 each by mouth as needed (for diarrhea.). 20 each 0   Lancets (ONETOUCH ULTRASOFT) lancets Use to check blood sugar 3 times daily with goal <130 fasting and <180 two hours after meal. 100 each 12   leflunomide (ARAVA) 20 MG tablet Take 20 mg by mouth daily.     levothyroxine (SYNTHROID) 50 MCG tablet Take 50 mcg by mouth daily before breakfast.      liothyronine (CYTOMEL) 5 MCG tablet Take 5 mcg by mouth daily.     metFORMIN (GLUCOPHAGE) 500 MG tablet Take 1 tablet (500 mg total) by mouth 2 (two) times daily with a meal. 180 tablet 3   montelukast (SINGULAIR) 10 MG tablet Take 10 mg by mouth at bedtime.     tiZANidine (ZANAFLEX) 4 MG tablet Take 1 tablet (4 mg total) by mouth every 8 (eight) hours as needed for muscle spasms. 90 tablet 0   triamcinolone cream (KENALOG) 0.1 % Apply 1 application topically 2 (two) times daily. 30 g 0   Cholecalciferol 1.25 MG (50000 UT) TABS Take 1 tablet by mouth once a week. For 8 weeks and then stop.  Return to office for lab draw. 12 tablet 3   gabapentin (NEURONTIN) 100 MG capsule Take 1 capsule (100 mg total) by mouth 3 (three) times daily. 270 capsule 4   losartan (COZAAR) 100 MG tablet Take 100 mg by mouth daily.      nicotine (NICOTINE STEP 1) 21 mg/24hr patch Place 1 patch (21 mg total) onto the skin daily. 28 patch 0   albuterol (VENTOLIN HFA) 108 (90 Base) MCG/ACT inhaler Inhale 2 puffs into the lungs every 4 (four)  hours as needed.      DULoxetine (CYMBALTA) 30 MG capsule Start out with 30 mg (one tablet) once daily by mouth for 1 week, then increase to 60 mg (two tablets) once daily by mouth. (Patient not taking: No sig reported) 180 capsule 4   golimumab (SIMPONI ARIA) 50 MG/4ML SOLN injection Inject into the vein.     ipratropium-albuterol (DUONEB) 0.5-2.5 (3) MG/3ML SOLN Take 3 mLs by nebulization every 6 (six) hours as needed for up to 7 days. 360 mL 0   No facility-administered medications prior to visit.    Allergies  Allergen Reactions   Azithromycin Anaphylaxis   Penicillins     Did it involve swelling of the face/tongue/throat, SOB, or low BP? Yes Did it involve sudden or severe rash/hives, skin peeling, or any reaction on the inside of your mouth or nose? No Did you need to seek medical attention at a hospital or doctor's office? No When did it last happen?       If all above answers are "NO", may proceed with cephalosporin use.    Shellfish Allergy     ROS Review of Systems  Constitutional:  Positive for fatigue. Negative for fever.  HENT: Negative.    Eyes: Negative.   Respiratory:  Positive for shortness of breath (chronic, stable with inhalers).   Cardiovascular:  Positive for chest pain (intermittent left sided chest pain 3 days ago). Negative for leg swelling.  Gastrointestinal: Negative.   Genitourinary: Negative.   Musculoskeletal:        Chronic pain in left hand  Skin: Negative.   Neurological:  Positive for dizziness (intermittent) and headaches (intermittent).  Psychiatric/Behavioral:  Positive for dysphoric mood.      Objective:    Physical Exam Vitals and nursing note reviewed.  Constitutional:  General: She is not in acute distress.    Appearance: Normal appearance.  HENT:     Head: Normocephalic and atraumatic.  Eyes:     Conjunctiva/sclera: Conjunctivae normal.  Cardiovascular:     Rate and Rhythm: Normal rate and regular rhythm.     Pulses:  Normal pulses.     Heart sounds: Normal heart sounds.  Pulmonary:     Effort: Pulmonary effort is normal.     Breath sounds: Normal breath sounds.  Musculoskeletal:     Cervical back: Normal range of motion.  Skin:    General: Skin is warm and dry.  Neurological:     General: No focal deficit present.     Mental Status: She is alert and oriented to person, place, and time.  Psychiatric:        Mood and Affect: Mood normal. Affect is tearful.        Behavior: Behavior normal.        Thought Content: Thought content normal.        Judgment: Judgment normal.    BP (!) 137/99   Pulse 86   Temp 98.5 F (36.9 C) (Oral)   Ht 5' 2.99" (1.6 m)   Wt 169 lb 6.4 oz (76.8 kg)   SpO2 96%   BMI 30.02 kg/m  Wt Readings from Last 3 Encounters:  06/02/21 169 lb 6.4 oz (76.8 kg)  04/15/21 160 lb (72.6 kg)  03/16/21 172 lb 6.4 oz (78.2 kg)     Health Maintenance Due  Topic Date Due   MAMMOGRAM  Never done   INFLUENZA VACCINE  05/23/2021    There are no preventive care reminders to display for this patient.  Lab Results  Component Value Date   TSH 0.614 01/24/2021   Lab Results  Component Value Date   WBC 10.7 01/24/2021   HGB 15.2 01/24/2021   HCT 46.5 01/24/2021   MCV 94 01/24/2021   PLT 429 01/24/2021   Lab Results  Component Value Date   NA 146 (H) 01/24/2021   K 4.3 01/24/2021   CO2 19 (L) 01/24/2021   GLUCOSE 237 (H) 01/24/2021   BUN 19 01/24/2021   CREATININE 0.67 01/24/2021   BILITOT <0.2 01/24/2021   ALKPHOS 86 01/24/2021   AST 12 01/24/2021   ALT 17 01/24/2021   PROT 6.9 01/24/2021   ALBUMIN 4.3 01/24/2021   CALCIUM 9.5 01/24/2021   ANIONGAP 11 08/11/2020   EGFR 106 01/24/2021   No results found for: CHOL No results found for: HDL No results found for: LDLCALC No results found for: TRIG No results found for: CHOLHDL Lab Results  Component Value Date   HGBA1C 6.7 (H) 01/24/2021      Assessment & Plan:   Problem List Items Addressed This Visit        Cardiovascular and Mediastinum   Hypertension associated with diabetes ([place]) - Primary    Chronic, not controlled.  She has not been taking her medication because her blood pressure was "normal" at home. She restarted her losartan about a week ago.  Her blood pressure has still been 160s to 170/low 100s at home.  Discussed taking her losartan and amlodipine every day. Continue checking blood pressure at home. Losartan refill sent to the pharmacy. Follow-up in 4 weeks or sooner with BP concerns.       Relevant Medications   losartan (COZAAR) 100 MG tablet   Other Relevant Orders   CBC with Differential/Platelet   Comprehensive metabolic panel  Endocrine   Type 2 diabetes mellitus with obesity (HCC)    Chronic. Check A1C today. She is taking her metformin twice a day and farxiga. Continue regimen and will adjust treatment as needed. Follow-up in 3 months.       Relevant Medications   losartan (COZAAR) 100 MG tablet   Other Relevant Orders   Bayer DCA Hb A1c Waived   CBC with Differential/Platelet   Comprehensive metabolic panel     Other   Depression, major, single episode, mild (HCC)    Chronic, not controlled. Discussed that stomach pain is a side effect when starting cymbalta and that it should resolve in 1-2 weeks. She can take the cymbalta at night to help with symptoms as well. Currently denies SI/HI, if develops, call office or go to ER immediately. Follow up in 4-6 weeks, or sooner if needed.       Other Visit Diagnoses     Encounter for screening for cardiovascular disorders       Check lipid panel today   Relevant Orders   Lipid Panel w/o Chol/HDL Ratio   Chest pain, unspecified type       Chest pain 3 days ago.  No chest pain today.  EKG shows normal sinus rhythm with no ST changes.  May be from elevated BP.  Follow-up if more chest pain   Relevant Orders   EKG 12-Lead (Completed)       Meds ordered this encounter  Medications   losartan (COZAAR) 100  MG tablet    Sig: Take 1 tablet (100 mg total) by mouth daily.    Dispense:  90 tablet    Refill:  0   nicotine (NICOTINE STEP 1) 21 mg/24hr patch    Sig: Place 1 patch (21 mg total) onto the skin daily.    Dispense:  28 patch    Refill:  0     Follow-up: Return in about 4 weeks (around 06/30/2021) for mood, htn .    Charyl Dancer, NP

## 2021-06-02 NOTE — Assessment & Plan Note (Addendum)
Chronic, not controlled.  She has not been taking her medication because her blood pressure was "normal" at home. She restarted her losartan about a week ago.  Her blood pressure has still been 160s to 170/low 100s at home.  Discussed taking her losartan and amlodipine every day. Continue checking blood pressure at home. Losartan refill sent to the pharmacy. Follow-up in 4 weeks or sooner with BP concerns.

## 2021-06-02 NOTE — Telephone Encounter (Signed)
A1C explained to patient.

## 2021-06-02 NOTE — Telephone Encounter (Signed)
Copied from [place] [id number]. Topic: General - Inquiry >> Jun 02, 2021  3:39 PM Greggory Keen D wrote: Reason for CRM: Pt request call back to go over her labs.   [phone]

## 2021-06-02 NOTE — Assessment & Plan Note (Addendum)
Chronic, not controlled. Discussed that stomach pain is a side effect when starting cymbalta and that it should resolve in 1-2 weeks. She can take the cymbalta at night to help with symptoms as well. Currently denies SI/HI, if develops, call office or go to ER immediately. Follow up in 4-6 weeks, or sooner if needed.

## 2021-06-02 NOTE — Progress Notes (Signed)
EKG done by me on 06/02/2021 shows normal sinus rhythm with a heart rate of 75.  No ST or T wave changes.

## 2021-06-03 ENCOUNTER — Telehealth: Payer: Self-pay

## 2021-06-03 ENCOUNTER — Ambulatory Visit: Payer: Medicare Other | Admitting: Advanced Practice Midwife

## 2021-06-03 LAB — LIPID PANEL W/O CHOL/HDL RATIO
Cholesterol, Total: 170 mg/dL (ref 100–199)
HDL: 71 mg/dL (ref >39)
LDL Chol Calc (NIH): 78 mg/dL (ref 0–99)
Triglycerides: 118 mg/dL (ref 0–149)
VLDL Cholesterol Cal: 21 mg/dL (ref 5–40)

## 2021-06-03 LAB — COMPREHENSIVE METABOLIC PANEL
ALT: 22 IU/L (ref 0–32)
AST: 20 IU/L (ref 0–40)
Albumin/Globulin Ratio: 1.7 (ref 1.2–2.2)
Albumin: 4.7 g/dL (ref 3.8–4.9)
Alkaline Phosphatase: 80 IU/L (ref 44–121)
BUN/Creatinine Ratio: 19 (ref 9–23)
BUN: 9 mg/dL (ref 6–24)
Bilirubin Total: 0.3 mg/dL (ref 0.0–1.2)
CO2: 21 mmol/L (ref 20–29)
Calcium: 9.7 mg/dL (ref 8.7–10.2)
Chloride: 101 mmol/L (ref 96–106)
Creatinine, Ser: 0.48 mg/dL — ABNORMAL LOW (ref 0.57–1.00)
Globulin, Total: 2.8 g/dL (ref 1.5–4.5)
Glucose: 112 mg/dL — ABNORMAL HIGH (ref 65–99)
Potassium: 4.3 mmol/L (ref 3.5–5.2)
Sodium: 141 mmol/L (ref 134–144)
Total Protein: 7.5 g/dL (ref 6.0–8.5)
eGFR: 114 mL/min/{1.73_m2} (ref >59)

## 2021-06-03 LAB — CBC WITH DIFFERENTIAL/PLATELET
Basophils Absolute: 0.1 10*3/uL (ref 0.0–0.2)
Basos: 1 %
EOS (ABSOLUTE): 0.3 10*3/uL (ref 0.0–0.4)
Eos: 4 %
Hematocrit: 48.6 % — ABNORMAL HIGH (ref 34.0–46.6)
Hemoglobin: 15.9 g/dL (ref 11.1–15.9)
Immature Grans (Abs): 0 10*3/uL (ref 0.0–0.1)
Immature Granulocytes: 0 %
Lymphocytes Absolute: 4.5 10*3/uL — ABNORMAL HIGH (ref 0.7–3.1)
Lymphs: 51 %
MCH: 31.2 pg (ref 26.6–33.0)
MCHC: 32.7 g/dL (ref 31.5–35.7)
MCV: 95 fL (ref 79–97)
Monocytes Absolute: 0.5 10*3/uL (ref 0.1–0.9)
Monocytes: 6 %
Neutrophils Absolute: 3.3 10*3/uL (ref 1.4–7.0)
Neutrophils: 38 %
Platelets: 348 10*3/uL (ref 150–450)
RBC: 5.1 x10E6/uL (ref 3.77–5.28)
RDW: 12.6 % (ref 11.7–15.4)
WBC: 8.6 10*3/uL (ref 3.4–10.8)

## 2021-06-03 NOTE — Telephone Encounter (Signed)
PA was initiated via CoverMyMeds for Nicotine Patches.  KEY: [id number]

## 2021-06-07 ENCOUNTER — Ambulatory Visit: Payer: Self-pay | Admitting: *Deleted

## 2021-06-07 NOTE — Telephone Encounter (Signed)
Reason for Disposition  [1] MODERATE difficulty breathing (e.g., speaks in phrases, SOB even at rest, pulse 100-120) AND [2] still present when not coughing  Answer Assessment - Initial Assessment Questions 1. ONSET: "When did the cough begin?"      2 days 2. SEVERITY: "How bad is the cough today?"      Spells 3. SPUTUM: "Describe the color of your sputum" (none, dry cough; clear, white, yellow, green)     White/clear 4. HEMOPTYSIS: "Are you coughing up any blood?" If so ask: "How much?" (flecks, streaks, tablespoons, etc.)     no 5. DIFFICULTY BREATHING: "Are you having difficulty breathing?" If Yes, ask: "How bad is it?" (e.g., mild, moderate, severe)    - MILD: No SOB at rest, mild SOB with walking, speaks normally in sentences, can lie down, no retractions, pulse < 100.    - MODERATE: SOB at rest, SOB with minimal exertion and prefers to sit, cannot lie down flat, speaks in phrases, mild retractions, audible wheezing, pulse 100-120.    - SEVERE: Very SOB at rest, speaks in single words, struggling to breathe, sitting hunched forward, retractions, pulse > 120      Mild/moderate 6. FEVER: "Do you have a fever?" If Yes, ask: "What is your temperature, how was it measured, and when did it start?"     No fever 7. CARDIAC HISTORY: "Do you have any history of heart disease?" (e.g., heart attack, congestive heart failure)      High BP 8. LUNG HISTORY: "Do you have any history of lung disease?"  (e.g., pulmonary embolus, asthma, emphysema)     emphysema 9. PE RISK FACTORS: "Do you have a history of blood clots?" (or: recent major surgery, recent prolonged travel, bedridden)     no 10. OTHER SYMPTOMS: "Do you have any other symptoms?" (e.g., runny nose, wheezing, chest pain)       Chest discomfort,cough 11. PREGNANCY: "Is there any chance you are pregnant?" "When was your last menstrual period?"       N/a 12. TRAVEL: "Have you traveled out of the country in the last month?" (e.g., travel  history, exposures)       No exposure  Protocols used: Cough - Acute Productive-A-AH

## 2021-06-07 NOTE — Telephone Encounter (Signed)
Routing to provider to advise on nurse triage note.

## 2021-06-07 NOTE — Telephone Encounter (Signed)
Patient returned call for her lab results- after giving results- patient states she is sick and felling horrible. Patient has symptoms related to COVID- cough, chest discomfort, SOB- did use nebulizer last night. Patient states she feels awful. Patient advised she needs to be seen and with the breathing issues she is having - advised ED. Patient states she does not want to go- advised her if she goes to [facility]- they may send her.Will send alert to PCP regarding patient illness.

## 2021-06-07 NOTE — Telephone Encounter (Signed)
Patient notified. Patient states that everywhere is filled with people worse off then her and that she is just going to take a COVID test at home. Encouraged patient to go to [facility] or ER if her breathing gets worse.

## 2021-06-07 NOTE — Chronic Care Management (AMB) (Signed)
  Chronic Care Management   Note  06/07/2021 Name: Catherine Giles MRN: [id number] DOB: [birth year]  Catherine Giles is a 50 y.o. year old female who is a primary care patient of Cannady, Barbaraann Faster, NP. Catherine Giles is currently enrolled in care management services. An additional referral for LCSW and Pharm D  was placed.   Follow up plan: Telephone appointment with care management team member scheduled for: 06/24/2021   Patient declines  engagement by Pharm D . Appropriate care team members and provider have been notified via electronic communication.   Catherine Giles, [place], [place], [place] [address] Direct Dial: [phone] [email or web address]: [email or web address]

## 2021-06-07 NOTE — Progress Notes (Signed)
Patient declined scheduling with Pharm D

## 2021-06-08 DIAGNOSIS — Z20822 Contact with and (suspected) exposure to covid-19: Secondary | ICD-10-CM | POA: Diagnosis not present

## 2021-06-13 ENCOUNTER — Telehealth: Payer: Self-pay

## 2021-06-13 NOTE — Progress Notes (Signed)
    Unsuccessfully unable to reach patient to try to reschedule Initial CCM visit with CPP Madelin Rear.I attempted to call twice. I have also left my call back number for the patient to return my call.  Corrie Mckusick, [place]

## 2021-06-14 DIAGNOSIS — G5602 Carpal tunnel syndrome, left upper limb: Secondary | ICD-10-CM | POA: Diagnosis not present

## 2021-06-24 ENCOUNTER — Telehealth: Payer: Self-pay | Admitting: Licensed Clinical Social Worker

## 2021-06-24 ENCOUNTER — Telehealth: Payer: Medicare Other

## 2021-06-24 NOTE — Telephone Encounter (Signed)
    Clinical Social Work  Care Management   Phone Outreach    06/24/2021 Name: Catherine Giles MRN: [id number] DOB: [birth year]  Catherine Giles is a 50 y.o. year old female who is a primary care patient of Cannady, Barbaraann Faster, NP .   Reason for referral: Mental Health Counseling and Resources.    CCM LCSW reached out to patient today by phone to introduce self, assess needs and offer Care Management services and interventions.    2nd unsuccessful telephone outreach attempt.  If unable to reach patient by phone on the 3rd attempt, will discontinue outreach calls but will be available at any time to provide services.   Plan:CCM LCSW will wait for return call. If no return call is received, Will route chart to Care Guide to see if patient would like to reschedule phone appointment   Review of patient status, including review of consultants reports, relevant laboratory and other test results, and collaboration with appropriate care team members and the patient's provider was performed as part of comprehensive patient evaluation and provision of care management services.    Christa See, MSW, [email or web address] Phone [phone] 4:18 PM

## 2021-07-04 ENCOUNTER — Telehealth: Payer: Self-pay

## 2021-07-04 NOTE — Chronic Care Management (AMB) (Signed)
  Care Management   Note  07/04/2021 Name: Rumor Sirman MRN: [id number] DOB: 11-Feb-1971  Faythe Hulse is a 50 y.o. year old female who is a primary care patient of Cannady, Barbaraann Faster, NP and is actively engaged with the care management team. I reached out to [employer] by phone today to assist with re-scheduling an initial visit with the Licensed Clinical Social Worker  Follow up plan: Patient declines further follow up and engagement by the care management team. Appropriate care team members and provider have been notified via electronic communication.   Noreene Larsson, [place], [place], [place] [address] Direct Dial: [phone] [email or web address] Website: [email or web address]

## 2021-07-04 NOTE — Telephone Encounter (Signed)
Pt declined rescheduling

## 2021-07-06 ENCOUNTER — Telehealth: Payer: Self-pay

## 2021-07-06 NOTE — Telephone Encounter (Signed)
Copied from [place] [id number]. Topic: General - Other >> Jul 06, 2021  8:59 AM Leward Quan A wrote: Reason for CRM: Nanine Means with [facility] called in to inform [facility]  In home assessment and had abnormal findings of severe depression and severe pain. Any questions please call Manuela Schwartz and reach out to patient Ph# [phone]   Routing to provider as an FYI.

## 2021-07-07 ENCOUNTER — Other Ambulatory Visit: Payer: Self-pay | Admitting: Nurse Practitioner

## 2021-07-07 MED ORDER — IBUPROFEN 400 MG PO TABS: 400.0000 mg | ORAL_TABLET | Freq: Three times a day (TID) | ORAL | 0 refills | Status: DC | PRN

## 2021-07-07 NOTE — Telephone Encounter (Signed)
Requested medication (s) are due for refill today: historical medication  Requested medication (s) are on the active medication list: yes  Last refill:  06/03/20  Future visit scheduled: yes in 1 month  Notes to clinic:  historical med. Do you want to renew Rx? Please see encounter regarding prednisone request . Awaiting call from patient      Requested Prescriptions  Pending Prescriptions Disp Refills   ibuprofen (ADVIL) 400 MG tablet 30 tablet     Sig: Take by mouth.     Analgesics:  NSAIDS Failed - 07/07/2021  1:41 PM      Failed - Cr in normal range and within 360 days    Creatinine, Ser  Date Value Ref Range Status  06/02/2021 0.48 (L) 0.57 - 1.00 mg/dL Final          Passed - HGB in normal range and within 360 days    Hemoglobin  Date Value Ref Range Status  06/02/2021 15.9 11.1 - 15.9 g/dL Final          Passed - Patient is not pregnant      Passed - Valid encounter within last 12 months    Recent Outpatient Visits           1 month ago Hypertension associated with diabetes ([place])   [place], Lauren A, NP   3 months ago Exposure to COVID-19 virus   [place], Santiago Glad, NP   3 months ago Rheumatoid arthritis involving multiple sites with positive rheumatoid factor ([place])   [place], Henrine Screws T, NP   4 months ago Encounter for screening for [place], Avanti, MD   5 months ago Depression, major, single episode, mild ([place])   [place], Barbaraann Faster, NP       Future Appointments             In 1 month Cannady, Barbaraann Faster, NP [employer]   In 9 months  [employer]

## 2021-07-07 NOTE — Telephone Encounter (Signed)
Patient called, left VM to return the call to the office to speak to a nurse about the refill requests. Will need to discuss the reason she will need Prednisone, not on her current medication list.

## 2021-07-07 NOTE — Telephone Encounter (Signed)
Medication: ibuprofen (ADVIL) 400 MG tablet  prednisone Has the pt contacted their pharmacy? Yes, she said she should always have refills of these meds. Got irritated I asked Preferred pharmacy: [facility] [place], [address]  Please be advised refills may take up to 3 business days.  We ask that you follow up with your pharmacy.

## 2021-07-18 DIAGNOSIS — Z20822 Contact with and (suspected) exposure to covid-19: Secondary | ICD-10-CM | POA: Diagnosis not present

## 2021-07-20 ENCOUNTER — Ambulatory Visit (INDEPENDENT_AMBULATORY_CARE_PROVIDER_SITE_OTHER): Payer: Medicare Other

## 2021-07-20 ENCOUNTER — Telehealth: Payer: Medicare Other | Admitting: General Practice

## 2021-07-20 DIAGNOSIS — I152 Hypertension secondary to endocrine disorders: Secondary | ICD-10-CM

## 2021-07-20 DIAGNOSIS — M0579 Rheumatoid arthritis with rheumatoid factor of multiple sites without organ or systems involvement: Secondary | ICD-10-CM

## 2021-07-20 DIAGNOSIS — M79601 Pain in right arm: Secondary | ICD-10-CM

## 2021-07-20 DIAGNOSIS — F320 Major depressive disorder, single episode, mild: Secondary | ICD-10-CM

## 2021-07-20 DIAGNOSIS — E1159 Type 2 diabetes mellitus with other circulatory complications: Secondary | ICD-10-CM

## 2021-07-20 DIAGNOSIS — M797 Fibromyalgia: Secondary | ICD-10-CM

## 2021-07-20 NOTE — Chronic Care Management (AMB) (Signed)
Chronic Care Management   CCM RN Visit Note  07/20/2021 Name: Catherine Giles MRN: [id number] DOB: 1971-05-10  Subjective: Catherine Giles is a 50 y.o. year old female who is a primary care patient of Cannady, Barbaraann Faster, NP. The care management team was consulted for assistance with disease management and care coordination needs.    Engaged with patient by telephone for follow up visit in response to provider referral for case management and/or care coordination services.   Consent to Services:  The patient was given information about Chronic Care Management services, agreed to services, and gave verbal consent prior to initiation of services.  Please see initial visit note for detailed documentation.   Patient agreed to services and verbal consent obtained.   Assessment: Review of patient past medical history, allergies, medications, health status, including review of consultants reports, laboratory and other test data, was performed as part of comprehensive evaluation and provision of chronic care management services.   SDOH (Social Determinants of Health) assessments and interventions performed:    CCM Care Plan  Allergies  Allergen Reactions   Azithromycin Anaphylaxis   Penicillins     Did it involve swelling of the face/tongue/throat, SOB, or low BP? Yes Did it involve sudden or severe rash/hives, skin peeling, or any reaction on the inside of your mouth or nose? No Did you need to seek medical attention at a hospital or doctor's office? No When did it last happen?       If all above answers are "NO", may proceed with cephalosporin use.    Shellfish Allergy     Outpatient Encounter Medications as of 07/20/2021  Medication Sig Note   albuterol (VENTOLIN HFA) 108 (90 Base) MCG/ACT inhaler Inhale 2 puffs into the lungs every 4 (four) hours as needed.     amLODipine (NORVASC) 5 MG tablet Take 1 tablet (5 mg total) by mouth daily.    Blood Glucose Monitoring Suppl (ONETOUCH VERIO)  w/Device KIT Use to check blood sugar 3 times daily with goal <130 fasting and <180 two hours after meal.  Bring meter to visits.    cyproheptadine (PERIACTIN) 4 MG tablet Take 1 tablet (4 mg total) by mouth at bedtime.    dapagliflozin propanediol (FARXIGA) 10 MG TABS tablet Take 1 tablet (10 mg total) by mouth daily before breakfast.    DULoxetine (CYMBALTA) 30 MG capsule Start out with 30 mg (one tablet) once daily by mouth for 1 week, then increase to 60 mg (two tablets) once daily by mouth. (Patient not taking: No sig reported) 05/27/2021: Patient states she has stopped taking, broke her out and she stopped taking, started taking again and made her very sick x 2 days. Is not longer taking.    fexofenadine (ALLEGRA ALLERGY) 180 MG tablet Take 1 tablet (180 mg total) by mouth daily.    fluticasone (FLONASE) 50 MCG/ACT nasal spray Place 2 sprays into both nostrils daily.    Fluticasone-Umeclidin-Vilant (TRELEGY ELLIPTA) 100-62.5-25 MCG/INH AEPB Inhale 1 puff into the lungs daily.    glucose blood test strip Use to check blood sugar 3 times daily with goal <130 fasting and <180 two hours after meal.    golimumab (SIMPONI ARIA) 50 MG/4ML SOLN injection Inject into the vein.    ibuprofen (ADVIL) 400 MG tablet Take 1 tablet (400 mg total) by mouth every 8 (eight) hours as needed.    ipratropium-albuterol (DUONEB) 0.5-2.5 (3) MG/3ML SOLN Take 3 mLs by nebulization every 6 (six) hours as needed for up to  7 days.    Lactobacillus PACK Take 1 each by mouth as needed (for diarrhea.).    Lancets (ONETOUCH ULTRASOFT) lancets Use to check blood sugar 3 times daily with goal <130 fasting and <180 two hours after meal.    leflunomide (ARAVA) 20 MG tablet Take 20 mg by mouth daily.    levothyroxine (SYNTHROID) 50 MCG tablet Take 50 mcg by mouth daily before breakfast.     liothyronine (CYTOMEL) 5 MCG tablet Take 5 mcg by mouth daily.    losartan (COZAAR) 100 MG tablet Take 1 tablet (100 mg total) by mouth daily.     metFORMIN (GLUCOPHAGE) 500 MG tablet Take 1 tablet (500 mg total) by mouth 2 (two) times daily with a meal.    montelukast (SINGULAIR) 10 MG tablet Take 10 mg by mouth at bedtime.    nicotine (NICOTINE STEP 1) 21 mg/24hr patch Place 1 patch (21 mg total) onto the skin daily.    tiZANidine (ZANAFLEX) 4 MG tablet Take 1 tablet (4 mg total) by mouth every 8 (eight) hours as needed for muscle spasms.    triamcinolone cream (KENALOG) 0.1 % Apply 1 application topically 2 (two) times daily.    [DISCONTINUED] cetirizine (ZYRTEC) 10 MG tablet Take 1 tablet (10 mg total) by mouth daily.    [DISCONTINUED] traZODone (DESYREL) 50 MG tablet Take 1 tablet (50 mg total) by mouth at bedtime.    No facility-administered encounter medications on file as of 07/20/2021.    Patient Active Problem List   Diagnosis Date Noted   Depression, major, single episode, mild ([place]) 01/24/2021   Memory changes 10/12/2020   Dairy allergy 10/12/2020   Headache 10/12/2020   Vaginal stenosis 03/19/2020   Obesity 03/19/2020   Insomnia 01/05/2020   Complex regional pain syndrome I of upper limb 12/17/2019   Hypertension associated with diabetes ([place]) 12/13/2019   Centrilobular emphysema ([place]) 12/13/2019   Type 2 diabetes mellitus with obesity ([place]) 12/13/2019   Hypothyroid 12/13/2019   DDD (degenerative disc disease), lumbar 08/26/2019   Multinodular goiter 08/14/2019   Chronic left hip pain 05/05/2019   Osteoarthritis of spine with radiculopathy, cervical region 05/05/2019   Fibromyalgia 02/10/2019   Rheumatoid arthritis involving multiple sites with positive rheumatoid factor ([place]) 02/10/2019   Undifferentiated connective tissue disease ([place]) 02/10/2019   Cervical radiculopathy 03/13/2018   Vitamin D deficiency 10/11/2015    Conditions to be addressed/monitored:HTN, Depression, and chronic pain, RA  Care Plan : RNCM: Depression (Adult)  Updates made by Vanita Ingles, RN since 07/20/2021 12:00 AM     Problem:  RNCM: Harm or Injury (Depression)   Priority: High     Long-Range Goal: RNCM: Harm or Injury Prevented- depression   Start Date: 03/08/2021  Expected End Date: 03/08/2022  This Visit's Progress: On track  Recent Progress: On track  Priority: High  Note:   Current Barriers:  Knowledge Deficits related to resources for effective management of depression, depression worse due to severe pain and unable to take medication that was prescribed for depression Care Coordination needs related to effective management of depression exacerbation  in a patient with severe pain causing depression symptoms to be exacerbated Chronic Disease Management support and education needs related to depression with a patient with multiple chronic conditions including sever pain Lacks caregiver support.  Non-adherence to scheduled provider appointments Non-adherence to prescribed medication regimen Unable to independently manage depression due to severe pain and unable to take Cymbalta due to side effect of rash. Stopped taking the  medication but did not notify the provider.  Unable to self administer medications as prescribed Does not attend all scheduled provider appointments Does not adhere to prescribed medication regimen Lacks social connections Does not contact provider office for questions/concerns  Nurse Case Manager Clinical Goal(s):  patient will verbalize understanding of plan for effective management of depression  patient will work with Silvana, CCM team and pcp to address needs related to effective management of depression patient will demonstrate a decrease in depression  exacerbations as evidenced by finding medication to help with depression, working with CCM team to optimize mental health and well being.  patient will attend all scheduled medical appointments: 05-11-2021 at [place] am, 05-27-2021: Missed several appointments including x 2 to the pcp office. Agrees today to talk to the office to secure a new  appointment with pcp. 07-20-2021: Ask for an appointment with the pcp so she can come in and discuss her health, well being, and medications. In basket message sent to staff asking for them to reach out to her and schedule an appointment.  patient will experience decrease in ED visits. ED visits in last 6 months = 6, 1 actuall ED, 5 urgent care patient will work with CM team pharmacist to assist with medication side effects, education, and reconciliation  patient will work with CM clinical social worker to worsening depression due to uncontrolled pain and discomfort. 04-08-2021: Currently working with the LCSW for ongoing support and assistance. 07-20-2021: The patient has declined to reschedule with the LCSW at this time. Did discuss with her the benefits of the CCM team. The patient states she will continue to work with the [facility] and will consider working with the LCSW.   Interventions:  1:1 collaboration with Venita Lick, NP regarding development and update of comprehensive plan of care as evidenced by provider attestation and co-signature Inter-disciplinary care team collaboration (see longitudinal plan of care) Evaluation of current treatment plan related to depression  and patient's adherence to plan as established by provider. The patient is currently having an exacerbation of her depression and was very tearful. Empathetic listening and support given. Encouraged the patient to reach out for help if she felt hopeless or had thoughts of hurting self. 04-08-2021: The patient is feeling better today but still having issues with pain and depression. She just wants to have balance in her life. She states she talked to someone about her appointment with pain clinic but does not remember if an appointment was made. Gave her the information to call the pain clinic. The patient verbalized understanding. Empathetic listening and support given.  05-27-2021: The patient is very frustrated with herself and her  healthy conditions. She has missed multiple appointments due to her memory and not remember what she needs to do and when she needs to go. She wants to feel better. Her husband has changed his schedule so he can help her more with the management of her conditions. She says her family is trying to help her get on track with her appointments and managing her health. The patient was not tearful today. Expressed thankfulness for RNCM call. Will work with the office to get a new appointment for follow up with the pcp. 07-20-2021: The patient states that she knows she has not been compliant with appointments and recommendations of the providers. She is taking the cymbalta now and denies issues with the cymbalta. States she lives in a "constant state of pain". She is much clearer today in her mentation and states  her husband changing his schedule has been helpful for her. She is going through "menopause" and this is making things worse. She is having hot flashes and other symptoms. She ask if the pcp could refer her to a different OB/GYN or help her with medications to help the symptoms. She also needs a referral for mammogram.  Will collaborate with the pcp on multiple request the patient expressed today. Did ask the patient to write down questions to ask the pcp at her appointment. Also reminded the patient it was essential to keep her appointment with the pcp.  Advised patient to call the office for changes or questions, to be available when the office calls to make appointment with pcp (appointment secured for 03-15-2021 at 0940 am) to seek emergent help for suicidal ideation or thoughts of hurting self or others. 05-27-2021: The patient remains non-compliant with appointments. Has missed several. She denies SI and the patient states that she is trying to do better. Her family is on board with helping her with her appointments. 07-20-2021: The patient admits to non-compliance in the past and wants to get her healht and  well being under control. She states she has been through so much. Education and support given.  Provided education to patient re: the need to find a medication that would work well for her depression that does not cause side effects. The patient stopped taking the Cymbalta due to a rash. She says her depression is worse due to the severe pain and discomfort she is experiencing. 04-08-2021: The patient is not tearful today. States compliance with medications and they are "trying" to help but her pain level is so bad. She is hopeful the pain clinic can assist her and help her with management of her RA pain. She feels this will help improve her depressive state. Denies any SI at this time. Will continue to monitor. 05-27-2021: The patient is not taking the Cymbalta. She tried it but it broke her out, she stopped it and then started it again and it made her "very sick" x 2 days so she is not taking it again. Talked to her about the importance of keeping her appointments that the providers can not help her if she does not follow up with the providers. The patient verbalized understanding. Will collaborate with the whole CCM team and pcp for assistance with the patient in meeting her needs. 07-20-2021: She wants to come in and see the pcp and discuss all of her medications with the pcp. She wants to make sure she understands what they are for and why she is taking. Education and support given.  Reviewed medications with patient and discussed compliance. The patient is not taking any medications for depression at this time. 04-08-2021: States compliance with the medications at this time. 05-27-2021: Is currently not taking the cymbalta, is taking Lyrica but states it is not effective in helping her pain level. She says her stomach is "so tore up", will consult with pharm D for assistance with medications management. 9-28-2022Ladon Applebaum that she is taking Cymbalta now and is not having any further issues with Cymbalta.   Collaborated with pcp, CCM team  regarding exacerbation of depression and the need to see pcp for evaluation and treatment. 05-27-2021: Will collaborate with the pcp and CCM team to get needed help to meet the patients needs.  Reviewed scheduled/upcoming provider appointments including: 05-11-2021 at [place] am. 05-27-2021: The patient states she has missed several appointments and knows she has not followed  up appropriate. Agrees to get a new appointment with the pcp and have the admin staff call for a new appointment with the pcp. 07-20-2021: The patient agrees for the office to call and get an appointment with the pcp scheduled Social Work referral for exacerbation of depression. 04-08-2021: The patient is actively working with the LCSW at this time for ongoing support and recommendations. 07-20-2021: The patient declined scheduling with the LCSW. Will continue to monitor for needs and discuss re-engagement.  Pharmacy referral for medication side effects and medication reconciliation. Discussed plans with patient for ongoing care management follow up and provided patient with direct contact information for care management team  Patient Goals/Self-Care Activities patient will:  - Patient will self administer medications as prescribed Patient will attend all scheduled provider appointments Patient will call pharmacy for medication refills Patient will attend church or other social activities Patient will continue to perform ADL's independently Patient will continue to perform IADL's independently Patient will call provider office for new concerns or questions Patient will work with BSW to address care coordination needs and will continue to work with the clinical team to address health care and disease management related needs.   Seek emergent care for suicidal ideation or thoughts of harming self or others - action plan for worsening symptoms mutually developed - barriers to safety identified and  addressed - emergency contact information provided - family/caregiver monitoring of patient's emotional state encouraged - modification of home and work environment promoted - personal safety plan developed - provision of safe home environment by family/caregiver encouraged - risk of self-injury assessed - side effects of medication monitored - suicidal or self-injurious tendencies explored  Follow Up Plan: Telephone follow up appointment with care management team member scheduled for:  09-28-2021 at 345 pm       Care Plan : RNCM: Chronic Pain (Adult)  Updates made by Vanita Ingles, RN since 07/20/2021 12:00 AM     Problem: RNCM: Pain Management Plan (Chronic Pain) left chest pain, RA, Fibromyalgia   Priority: High     Long-Range Goal: RNCM: Pain Management Plan Developed   Start Date: 03/08/2021  Expected End Date: 03/08/2022  This Visit's Progress: Not on track  Recent Progress: Not on track  Priority: High  Note:   Current Barriers:  Knowledge Deficits related to managing acute/chronic pain Non-adherence to scheduled provider appointments Non-adherence to prescribed medication regimen Difficulty obtaining medications Chronic Disease Management support and education needs related to chronic pain Unable to independently manage pain and discomfort as evidence of pain level at >10 today Unable to self administer medications as prescribed Does not attend all scheduled provider appointments Does not adhere to prescribed medication regimen Lacks social connections Does not contact provider office for questions/concerns Nurse Case Manager Clinical Goal(s):  patient will verbalize understanding of plan for managing pain patient will talk with RN Care Manager to address new concerns of pain and discomfort and help with exacerbation of pain and discomfort  patient will attend all scheduled medical appointments: 05-11-2021 at 0940 am- needs a new appointment. Staff to call for an  appointment 07-20-2021: The patient agrees to an appointment with the pcp and the office calling to set up and appointment with the patient for pcp visit.  patient will demonstrate use of different relaxation  skills and/or diversional activities to assist with pain reduction (distraction, imagery, relaxation, massage, acupressure, TENS, heat, and cold application patient will report pain at a level less than 3 to 4 on a 10-10  rating scale patient will use pharmacological and nonpharmacological pain relief strategies patient will verbalize acceptable level of pain relief and ability to engage in desired activities patient will engage in desired activities without an increase in pain level Interventions:  Collaboration with Marnee Guarneri T, NP regarding development and update of comprehensive plan of care as evidenced by provider attestation and co-signature Inter-disciplinary care team collaboration (see longitudinal plan of care) - deep breathing, relaxation and mindfulness use promoted - effectiveness of pharmacologic therapy monitored - medication-induced side effects managed - misuse of pain medication assessed - motivation and barriers to change assessed and addressed - mutually acceptable comfort goal set - pain assessed- 05-27-2021: The patient rates her pain as "uncontrolled", she has been taking Lyrica but does not see where it is helping. Had an infusion and it did not help but is having another one. The patient has missed several appointments and knows she has to follow up to get help. 07-20-2021: The patient had an infusion today. She states that she cannot say yet that it is helping. Due to a sick child and other circumstances she had to put her needs to the side and take care of those things and she got off schedule with her infusions. She will have another infusion in 8 weeks. She states she is wearing a wrist brace for her left wrist carpal tunnel and she will go back for another  appointment to see the specialist. They have discussed surgery but she is unsure that is the answer as everyone is addressing her Lupus and RA, not her fibromyalgia.  The patient says the wrist brace is helping "a little". She states the house calls visit NP was there a couple of weeks ago and states that she was concerned with her taking Leflumonide and was sending a message to the pcp. The patient states she told her it put her at increased risk of heart attack. Will send and in basket message to the pcp but also advised the patient that she likely would need to talk to her Rheumatologist as this was a medications for her RA. Patient verbalized understanding. She states that she lives in a "constant state of pain" and she has good days and bad days and she does the best that she can. She wants to be clear on what she is taking and why she is taking it.  - pain treatment goals reviewed 07-20-2021: The patient wants to get her pain under control  - premedication prior to activity encouraged Evaluation of current treatment plan related to pain: RA, fibromyalgia, and left chest pain (patient seen in ER and cardiac issues ruled out) and patient's adherence to plan as established by provider. 04-08-2021: The patient is still having issues with pain. She is concerned that the medications are causing diarrhea. She does not know what is going on. She states she is taking Farxiga 10 mg, and the duloxetine and gabapentin, along with tramadol. She states that she can tell it is "trying" to work. When inquiring about the pain clinic the patient states she talked to someone but her memory is foggy and she cannot remember is an appointment was made. Review of upcoming appointments and no appointments noted. Did give the number to Dr. Holley Raring office at [phone] and ask the patient to call and get an appointment with Dr. Holley Raring. The patient was going to call after the call with the [facility]. 05-27-2021: The patient states that she  has missed several appointments and it is her  fault.  She states she cannot remember things and she is overwhelmed with her health conditions. Suggested writing down her appointments or putting reminders on her phone. She states her family is trying to help her and her husband has even changed his work schedule so that he can help her. She wants to feel better. Discussed options and recommended that she call the specialist office and get numbers to call for the referrals to orthopedic providers and other referrals they have made and she has not gone to. 07-20-2021: The patient sees several providers for her chronic conditions and pain management. The patient states she has gotten off track with medications but is wanting to change things for the better and feel better. Review of upcoming specialist appointments. Advised the patient to write down questions to ask the providers so she would not forget what she wanted to ask. Advised patient to call the office for worsening level or intensity of pain and to seek emergent care for unresolved pain and discomfort  Provided education to patient re: safety concerns, working with the CCM team and pcp, alternative pain relief methods.  Reviewed medications with patient and discussed compliance, the patient is supposed to start infusions soon for her RA. She saw specialist last week and she is waiting on an appointment for infusion. 05-27-2021: The patient had the infusion but can not see where it helped. She is scheduled for a new infusion and is mindful of this appointment. The Lyrica she is taking but states she can not see where it has helped. Reflective listening and support given. 07-20-2021: Had an infusion today. States she cannot tell if it is helping. She will have another infusion in 8 weeks.  Collaborated with pcp, CCM team regarding Chronic pain and discomfort  Discussed plans with patient for ongoing care management follow up and provided patient with direct  contact information for care management team Allow patient to maintain a diary of pain ratings, timing, precipitating events, medications, treatments, and what works best to relieve pain,  Refer to support groups and self-help groups Educate patient about the use of pharmacological interventions for pain management- antianxiety, antidepressants, NSAIDS, opioid analgesics,  Explain the importance of lifestyle modifications to effective pain management  Patient Goals/Self Care Activities:  - mutually acceptable comfort goal set - pain assessed - pain management plan developed - pain treatment goals reviewed - patient response to treatment assessed - sharing of pain management plan with teachers and other caregivers encouraged Self-administers medications as prescribed Attends all scheduled provider appointments Calls pharmacy for medication refills Calls provider office for new concerns or questions Follow Up Plan: Telephone follow up appointment with care management team member scheduled for: 09-28-2021 at 345 pm     Care Plan : RNCM: Hypertension (Adult)  Updates made by Vanita Ingles, RN since 07/20/2021 12:00 AM     Problem: RNCM: Hypertension (Hypertension)   Priority: Medium     Long-Range Goal: RNCM: Hypertension Monitored   Start Date: 03/08/2021  Expected End Date: 05/22/2022  This Visit's Progress: On track  Recent Progress: On track  Priority: Medium  Note:   Objective:  Last practice recorded BP readings:  BP Readings from Last 3 Encounters:  06/02/21 (!) 137/99  03/16/21 138/85  03/06/21 (!) 171/98     Most recent eGFR/CrCl:  Lab Results  Component Value Date   EGFR 106 01/24/2021    No components found for: CRCL Current Barriers:  Knowledge Deficits related to basic understanding of hypertension pathophysiology  and self care management Knowledge Deficits related to understanding of medications prescribed for management of hypertension Non-adherence to  prescribed medication regimen Non-adherence to scheduled provider appointments Limited Social Support Unable to independently manage HTN Does not adhere to prescribed medication regimen Lacks social connections Does not contact provider office for questions/concerns Case Manager Clinical Goal(s):  patient will verbalize understanding of plan for hypertension management patient will attend all scheduled medical appointments: 05-11-2021- missed appointments x 2, needs new appointment, staff message sent. 07-20-2021: Agrees to let the office call and schedule her an appointment with pcp  patient will demonstrate improved adherence to prescribed treatment plan for hypertension as evidenced by taking all medications as prescribed, monitoring and recording blood pressure as directed, adhering to low sodium/DASH diet patient will demonstrate improved health management independence as evidenced by checking blood pressure as directed and notifying PCP if SBP>150 or DBP > 60, taking all medications as prescribe, and adhering to a low sodium diet as discussed. patient will verbalize basic understanding of hypertension disease process and self health management plan as evidenced by compliance with heart healthy diet, compliance with medications and working with the CCM team to manage health and well being.  Interventions:  Collaboration with Venita Lick, NP regarding development and update of comprehensive plan of care as evidenced by provider attestation and co-signature Inter-disciplinary care team collaboration (see longitudinal plan of care) UNABLE to independently:manage HTN Evaluation of current treatment plan related to hypertension self management and patient's adherence to plan as established by provider. 05-27-2021: The patient states her blood pressure is up when she is in pain. The patient states that she wants to have a balance in her health. Empathetic listening and support given. Will continue  to monitor. 07-20-2021: The patient had not been taking her medications for HTN but states she is taking her medications now. Denies any issues with medications. States she is taking her blood pressure at home and it has normalized. Education and support.  Provided education to patient re: stroke prevention, s/s of heart attack and stroke, DASH diet, complications of uncontrolled blood pressure Reviewed medications with patient and discussed importance of compliance. 07-20-2021: States she is compliant with medications at this time.  Discussed plans with patient for ongoing care management follow up and provided patient with direct contact information for care management team Advised patient, providing education and rationale, to monitor blood pressure daily and record, calling PCP for findings outside established parameters.  Reviewed scheduled/upcoming provider appointments including: office to call and schedule a follow up with the pcp  Self-Care Activities: - Self administers medications as prescribed Attends all scheduled provider appointments Calls provider office for new concerns, questions, or BP outside discussed parameters Checks BP and records as discussed Follows a low sodium diet/DASH diet Patient Goals: - check blood pressure daily - choose a place to take my blood pressure (home, clinic or office, retail store) - write blood pressure results in a log or diary - agree on reward when goals are met - agree to work together to make changes - ask questions to understand - have a family meeting to talk about healthy habits - learn about high blood pressure - blood pressure trends reviewed - depression screen reviewed - home or ambulatory blood pressure monitoring encouraged Follow Up Plan: Telephone follow up appointment with care management team member scheduled for: 09-28-2021 at 345 pm     Plan:Telephone follow up appointment with care management team member scheduled for:   09-28-2021 at 345  pm  Noreene Larsson RN, MSN, [facility] Mobile: [phone]

## 2021-07-20 NOTE — Patient Instructions (Signed)
Visit Information  PATIENT GOALS:  Goals Addressed             This Visit's Progress    RNCM: Cope with Chronic Pain       Timeframe:  Long-Range Goal Priority:  High Start Date:    03-08-2021                         Expected End Date:    06-21-2022                  Follow Up Date 09-28-2021    - learn how to meditate - practice acceptance of chronic pain - spend time with positive people - tell myself I can (not I can't) - think of new ways to do favorite things    Why is this important?   Stress makes chronic pain feel worse.  Feelings like depression, anxiety, stress and anger can make your body more sensitive to pain.  Learning ways to cope with stress or depression may help you find some relief from the pain.     04-08-2021: The patient is still have considerable pain. The medications are helping some but not a lot. She was not tearful today but says there is no balance in her life and she is tired of feeling the way she does. 05-27-2021: The patient states she is still having a lot of pain and discomfort. Has missed several appointments. States the Lyrica is not helping but she is taking. She states her "stomach is so messed up". She was not tearful today but frustrated with herself and her chronic conditions. Emotional support, empathetic listening and support given. Will contact the clinical and admin staff at [facility] to assist with rescheduling of appointment with the pcp. Will also ask for pharm D support and LCSW support. 07-20-2021: The patient was defensive at first with the [facility] and states that she lives in a constant state of pain. After redirection and review of the role of the RNCM the patient states that she is thankful for the help and to please not take things personal. The patient had an infusion today. She states she cannot say that it is helping as she got off schedule with her infusions for one month and now is getting back on track. She has seen a specialist about her left  carpal tunnel and she says the wrist brace is helping a little. She is unsure if surgery is the answer to the pain she is experiencing as everyone is addressing the Lupus and RA but not her fibromyalgia. The patient will follow up with the specialist next month and will further discuss treatment options.      RNCM: Manage Chronic Pain       Timeframe:  Long-Range Goal Priority:  High Start Date:    03-08-2021                         Expected End Date:         06-21-2022              Follow Up Date 09-28-2021 - call for medicine refill 2 or 3 days before it runs out - develop a personal pain management plan - keep track of prescription refills - plan exercise or activity when pain is best controlled - prioritize tasks for the day - track times pain is worst and when it is best - track what makes the  pain worse and what makes it better - work slower and less intense when having pain    Why is this important?   Day-to-day life can be hard when you have chronic pain.  Pain medicine is just one piece of the treatment puzzle.  You can try these action steps to help you manage your pain.    Notes: Appointment with the pcp on 03-15-2021. 6-17-202: The patient heard from Dr. Holley Raring office but does not remember appointment or anything like making an appointment. Supplied the number to Dr. Holley Raring office at [phone] for the patient to call after the call with the [facility]. Expressed the need to talk to the pain clinic and get established for help with pain management. 05-27-2021: The patient states she is no better. Admits that she has missed several doctor appointments and it is her fault because of her memory and forgetting. She missed her pcp appointment and rescheduled it but missed it again as she thought she was COVID positive again. She said it was her failure to call and tell them she could not come. The patient is unable to take the cymbalta due to it breaking her out and making her very sick. Will  collaborate with the pcp and pharm D. The patient has missed orthopedic appointments as well and does not know how to reach them. Advised her to call her RA provider to get assistance with referral numbers. The patient verbalized understanding. 07-20-2021: The patient was having a better day today that the last outreach. The patient was not tearful and was more clear in her thinking. She admits she has not been compliant like she should with her medications but is doing better with management of medications at this time. She endorses taking cymbalta now. She also had questions about Leflemonide. She takes this for her RA. Will collaborate with the pcp and ask about this as the housecalls NP told her the side effects put her at increase risk of heart attack. Did advise the patient that the RA provider may need to evaluate this question and medication but would talk with the pcp. Verbalized understanding.         Patient verbalizes understanding of instructions provided today and agrees to view in [place].   Telephone follow up appointment with care management team member scheduled for: 09-28-2021 at 1 pm  Noreene Larsson RN, MSN, [facility] Mobile: [phone]

## 2021-07-22 ENCOUNTER — Telehealth: Payer: Medicaid Other

## 2021-07-22 DIAGNOSIS — F320 Major depressive disorder, single episode, mild: Secondary | ICD-10-CM | POA: Diagnosis not present

## 2021-07-22 DIAGNOSIS — I152 Hypertension secondary to endocrine disorders: Secondary | ICD-10-CM

## 2021-07-22 DIAGNOSIS — E1159 Type 2 diabetes mellitus with other circulatory complications: Secondary | ICD-10-CM

## 2021-07-22 DIAGNOSIS — M0579 Rheumatoid arthritis with rheumatoid factor of multiple sites without organ or systems involvement: Secondary | ICD-10-CM

## 2021-07-25 ENCOUNTER — Other Ambulatory Visit: Payer: Self-pay

## 2021-07-25 ENCOUNTER — Ambulatory Visit
Admission: EM | Admit: 2021-07-25 | Discharge: 2021-07-25 | Disposition: A | Discharge status: Home / Self Care | Payer: Medicare Other | Attending: Family Medicine | Admitting: Family Medicine

## 2021-07-25 ENCOUNTER — Ambulatory Visit (INDEPENDENT_AMBULATORY_CARE_PROVIDER_SITE_OTHER): Payer: Medicare Other

## 2021-07-25 ENCOUNTER — Encounter: Payer: Self-pay | Admitting: Emergency Medicine

## 2021-07-25 ENCOUNTER — Ambulatory Visit: Payer: Self-pay | Admitting: *Deleted

## 2021-07-25 DIAGNOSIS — J432 Centrilobular emphysema: Secondary | ICD-10-CM | POA: Diagnosis not present

## 2021-07-25 DIAGNOSIS — Z20822 Contact with and (suspected) exposure to covid-19: Secondary | ICD-10-CM | POA: Insufficient documentation

## 2021-07-25 DIAGNOSIS — R0602 Shortness of breath: Secondary | ICD-10-CM | POA: Diagnosis not present

## 2021-07-25 DIAGNOSIS — J441 Chronic obstructive pulmonary disease with (acute) exacerbation: Secondary | ICD-10-CM | POA: Diagnosis not present

## 2021-07-25 DIAGNOSIS — Z880 Allergy status to penicillin: Secondary | ICD-10-CM | POA: Diagnosis not present

## 2021-07-25 DIAGNOSIS — Z7951 Long term (current) use of inhaled steroids: Secondary | ICD-10-CM | POA: Insufficient documentation

## 2021-07-25 DIAGNOSIS — F17210 Nicotine dependence, cigarettes, uncomplicated: Secondary | ICD-10-CM | POA: Insufficient documentation

## 2021-07-25 LAB — RESP PANEL BY RT-PCR (FLU A&B, COVID) ARPGX2
Influenza A by PCR: NEGATIVE
Influenza B by PCR: NEGATIVE
SARS Coronavirus 2 by RT PCR: NEGATIVE

## 2021-07-25 MED ORDER — ALBUTEROL SULFATE (2.5 MG/3ML) 0.083% IN NEBU: 2.5000 mg | INHALATION_SOLUTION | Freq: Four times a day (QID) | RESPIRATORY_TRACT | 12 refills | Status: DC | PRN

## 2021-07-25 MED ORDER — PROMETHAZINE-DM 6.25-15 MG/5ML PO SYRP: 5.0000 mL | ORAL_SOLUTION | Freq: Four times a day (QID) | ORAL | 0 refills | Status: DC | PRN

## 2021-07-25 MED ORDER — PREDNISONE 50 MG PO TABS: ORAL_TABLET | ORAL | 0 refills | Status: DC

## 2021-07-25 MED ORDER — DOXYCYCLINE HYCLATE 100 MG PO CAPS: 100.0000 mg | ORAL_CAPSULE | Freq: Two times a day (BID) | ORAL | 0 refills | Status: DC

## 2021-07-25 NOTE — Telephone Encounter (Signed)
Reason for Disposition  [1] MODERATE difficulty breathing (e.g., speaks in phrases, SOB even at rest, pulse 100-120) AND [2] NEW-onset or WORSE than normal  Answer Assessment - Initial Assessment Questions 1. RESPIRATORY STATUS: "Describe your breathing?" (e.g., wheezing, shortness of breath, unable to speak, severe coughing)      SOB, having hard time talking- coughing 2. ONSET: "When did this breathing problem begin?"      Over 1 week 3. PATTERN "Does the difficult breathing come and go, or has it been constant since it started?"     constant 4. SEVERITY: "How bad is your breathing?" (e.g., mild, moderate, severe)    - MILD: No SOB at rest, mild SOB with walking, speaks normally in sentences, can lie down, no retractions, pulse < 100.    - MODERATE: SOB at rest, SOB with minimal exertion and prefers to sit, cannot lie down flat, speaks in phrases, mild retractions, audible wheezing, pulse 100-120.    - SEVERE: Very SOB at rest, speaks in single words, struggling to breathe, sitting hunched forward, retractions, pulse > 120      moderate  Protocols used: Breathing Difficulty-A-AH

## 2021-07-25 NOTE — Telephone Encounter (Signed)
Patient is calling to request appointment- patient states he is sOB, coughing- green sputum, with chest tightness and chills. Patient is having a hard time talking- advised ED now

## 2021-07-25 NOTE — ED Triage Notes (Signed)
Pt c/o shortness of breath, cough, wheezing, sweats, and chills. Started about 2 days ago but has gotten worse this morning. She has known asthma and emphysema. She also states her right lung hurts.

## 2021-07-27 NOTE — ED Provider Notes (Signed)
[facility]    CSN: [id number] Arrival date & time: 07/25/21  1055      History   Chief Complaint Chief Complaint  Patient presents with   Shortness of Breath   Cough    HPI 50 year old female presents with the above complaints.  Patient reports that she has been sick for the past 2 days.  She has known emphysema.  She reports cough, shortness of breath, wheezing, sweats, and chills.  She feels very poorly.  She is concerned that she may have pneumonia.  She has had sick contacts at home.  She has had no relief with her albuterol inhaler.  She states that she does not have any tubing for her albuterol nebulizer.  Past Medical History:  Diagnosis Date   Arthritis    RA   Asthma    COPD (chronic obstructive pulmonary disease) ([place])    Depression    Diabetes mellitus without complication ([place])    Emphysema of lung ([place])    Family history of breast cancer    6/21 cancer genetic testing letter sent   Fibromyalgia    Hypertension    Lupus ([place])    Pre-diabetes    Thyroid disease     Patient Active Problem List   Diagnosis Date Noted   Depression, major, single episode, mild ([place]) 01/24/2021   Memory changes 10/12/2020   Dairy allergy 10/12/2020   Headache 10/12/2020   Vaginal stenosis 03/19/2020   Obesity 03/19/2020   Insomnia 01/05/2020   Complex regional pain syndrome I of upper limb 12/17/2019   Hypertension associated with diabetes ([place]) 12/13/2019   Centrilobular emphysema ([place]) 12/13/2019   Type 2 diabetes mellitus with obesity ([place]) 12/13/2019   Hypothyroid 12/13/2019   DDD (degenerative disc disease), lumbar 08/26/2019   Multinodular goiter 08/14/2019   Chronic left hip pain 05/05/2019   Osteoarthritis of spine with radiculopathy, cervical region 05/05/2019   Fibromyalgia 02/10/2019   Rheumatoid arthritis involving multiple sites with positive rheumatoid factor ([place]) 02/10/2019   Undifferentiated connective tissue disease ([place]) 02/10/2019    Cervical radiculopathy 03/13/2018   Vitamin D deficiency 10/11/2015    Past Surgical History:  Procedure Laterality Date   MOUTH SURGERY      OB History     Gravida  9   Para  6   Term  0   Preterm  0   AB  3   Living         SAB  2   IAB  0   Ectopic  0   Multiple      Live Births               Home Medications    Prior to Admission medications   Medication Sig Start Date End Date Taking? Authorizing Provider  albuterol (PROVENTIL) (2.5 MG/3ML) 0.083% nebulizer solution Take 3 mLs (2.5 mg total) by nebulization every 6 (six) hours as needed for wheezing or shortness of breath. 07/25/21  Yes Bernhard Koskinen G, DO  amLODipine (NORVASC) 5 MG tablet Take 1 tablet (5 mg total) by mouth daily. 10/12/20  Yes Cannady, Henrine Screws T, NP  dapagliflozin propanediol (FARXIGA) 10 MG TABS tablet Take 1 tablet (10 mg total) by mouth daily before breakfast. 03/16/21  Yes Cannady, Jolene T, NP  doxycycline (VIBRAMYCIN) 100 MG capsule Take 1 capsule (100 mg total) by mouth 2 (two) times daily. 07/25/21  Yes Sonna Lipsky G, DO  DULoxetine (CYMBALTA) 30 MG capsule Start out with 30 mg (one tablet)  once daily by mouth for 1 week, then increase to 60 mg (two tablets) once daily by mouth. 03/16/21  Yes Cannady, Jolene T, NP  fexofenadine (ALLEGRA ALLERGY) 180 MG tablet Take 1 tablet (180 mg total) by mouth daily. 02/28/21  Yes Vigg, Avanti, MD  fluticasone (FLONASE) 50 MCG/ACT nasal spray Place 2 sprays into both nostrils daily. 07/01/20  Yes Rodriguez-Southworth, Sunday Spillers, PA-C  golimumab ([place] ARIA) 50 MG/4ML SOLN injection Inject into the vein.   Yes [provider]  Lactobacillus PACK Take 1 each by mouth as needed (for diarrhea.). 02/28/21  Yes Vigg, Avanti, MD  leflunomide (ARAVA) 20 MG tablet Take 20 mg by mouth daily. 01/22/21  Yes [provider]  levothyroxine (SYNTHROID) 50 MCG tablet Take 50 mcg by mouth daily before breakfast.    Yes [provider]  liothyronine  (CYTOMEL) 5 MCG tablet Take 5 mcg by mouth daily. 08/05/20  Yes [provider]  losartan (COZAAR) 100 MG tablet Take 1 tablet (100 mg total) by mouth daily. 06/02/21  Yes McElwee, Lauren A, NP  metFORMIN (GLUCOPHAGE) 500 MG tablet Take 1 tablet (500 mg total) by mouth 2 (two) times daily with a meal. 01/25/21  Yes Cannady, Jolene T, NP  montelukast (SINGULAIR) 10 MG tablet Take 10 mg by mouth at bedtime. 12/29/20 12/29/21 Yes [provider]  predniSONE (DELTASONE) 50 MG tablet 1 tablet daily x 5 days 07/25/21  Yes Brenner Visconti G, DO  promethazine-dextromethorphan (PROMETHAZINE-DM) 6.25-15 MG/5ML syrup Take 5 mLs by mouth 4 (four) times daily as needed for cough. 07/25/21  Yes Yonathan Perrow G, DO  tiZANidine (ZANAFLEX) 4 MG tablet Take 1 tablet (4 mg total) by mouth every 8 (eight) hours as needed for muscle spasms. 08/23/20  Yes Eulogio Bear, NP  albuterol (VENTOLIN HFA) 108 (90 Base) MCG/ACT inhaler Inhale 2 puffs into the lungs every 4 (four) hours as needed.  02/12/19 02/20/21  [provider]  Blood Glucose Monitoring Suppl (ONETOUCH VERIO) w/Device KIT Use to check blood sugar 3 times daily with goal <130 fasting and <180 two hours after meal.  Bring meter to visits. 01/25/21   Cannady, Henrine Screws T, NP  cyproheptadine (PERIACTIN) 4 MG tablet Take 1 tablet (4 mg total) by mouth at bedtime. 01/25/21   Kozlow, Donnamarie Poag, MD  Fluticasone-Umeclidin-Vilant (TRELEGY ELLIPTA) 100-62.5-25 MCG/INH AEPB Inhale 1 puff into the lungs daily. 11/29/20   [provider]  glucose blood test strip Use to check blood sugar 3 times daily with goal <130 fasting and <180 two hours after meal. 01/25/21   Cannady, Jolene T, NP  ibuprofen (ADVIL) 400 MG tablet Take 1 tablet (400 mg total) by mouth every 8 (eight) hours as needed. 07/07/21   Cannady, Henrine Screws T, NP  ipratropium-albuterol (DUONEB) 0.5-2.5 (3) MG/3ML SOLN Take 3 mLs by nebulization every 6 (six) hours as needed for up to 7 days. 06/12/20 06/19/20   Laurene Footman B, PA-C  Lancets [facility] ULTRASOFT) lancets Use to check blood sugar 3 times daily with goal <130 fasting and <180 two hours after meal. 01/25/21   Cannady, Jolene T, NP  nicotine (NICOTINE STEP 1) 21 mg/24hr patch Place 1 patch (21 mg total) onto the skin daily. 06/02/21   McElwee, Lauren A, NP  triamcinolone cream (KENALOG) 0.1 % Apply 1 application topically 2 (two) times daily. 02/20/21   Zigmund Gottron, NP  cetirizine (ZYRTEC) 10 MG tablet Take 1 tablet (10 mg total) by mouth daily. 02/20/21 02/20/21  Zigmund Gottron,  NP  traZODone (DESYREL) 50 MG tablet Take 1 tablet (50 mg total) by mouth at bedtime. 01/05/20 07/01/20  Venita Lick, NP    Family History Family History  Problem Relation Age of Onset   Lung cancer Mother    Diabetes Mother    Breast cancer Mother 35   Hypertension Father    Cancer Maternal Aunt    Ovarian cancer Maternal Aunt    Diabetes Maternal Aunt    Diabetes Maternal Uncle    Hypertension Maternal Grandmother    Diabetes Maternal Grandmother     Social History Social History   Tobacco Use   Smoking status: Every Day    Packs/day: 1.00    Years: 34.00    Pack years: 34.00    Types: Cigarettes   Smokeless tobacco: Never   Tobacco comments:    2-3 cig/day  Vaping Use   Vaping Use: Never used  Substance Use Topics   Alcohol use: Yes    Comment: Occ   Drug use: Not Currently     Allergies   Azithromycin, Penicillins, and Shellfish allergy   Review of Systems Review of Systems  Constitutional:  Positive for chills.  Respiratory:  Positive for cough and shortness of breath.     Physical Exam Triage Vital Signs ED Triage Vitals  Enc Vitals Group     BP 07/25/21 1125 (!) 147/87     Pulse Rate 07/25/21 1125 (!) 112     Resp 07/25/21 1125 (!) 24     Temp 07/25/21 1125 98.2 F (36.8 C)     Temp Source 07/25/21 1125 Oral     SpO2 07/25/21 1125 94 %     Weight 07/25/21 1120 169 lb 5 oz (76.8 kg)     Height 07/25/21 1120 _0   (1.575 m)     Head Circumference --      Peak Flow --      Pain Score 07/25/21 1120 10     Pain Loc --      Pain Edu? --      Excl. in [place]? --    Updated Vital Signs BP (!) 147/87 (BP Location: Left Arm)   Pulse (!) 112   Temp 98.2 F (36.8 C) (Oral)   Resp (!) 24   Ht _1  (1.575 m)   Wt 76.8 kg   SpO2 94%   BMI 30.97 kg/m   Visual Acuity Right Eye Distance:   Left Eye Distance:   Bilateral Distance:    Right Eye Near:   Left Eye Near:    Bilateral Near:     Physical Exam Vitals and nursing note reviewed.  Constitutional:      Comments: Tachypnea noted.  HENT:     Head: Normocephalic and atraumatic.  Eyes:     General:        Right eye: No discharge.        Left eye: No discharge.     Conjunctiva/sclera: Conjunctivae normal.  Cardiovascular:     Rate and Rhythm: Regular rhythm. Tachycardia present.  Pulmonary:     Comments: Mild increased work of breathing.  Diffuse expiratory wheezing. Neurological:     Mental Status: She is alert.  Psychiatric:        Mood and Affect: Mood normal.        Behavior: Behavior normal.    UC Treatments / Results  Labs (all labs ordered are listed, but only abnormal results are displayed) Labs Reviewed  RESP PANEL BY  RT-PCR (FLU A&B, COVID) [id number]    EKG   Radiology DG Chest 2 View  Result Date: 07/25/2021 CLINICAL DATA:  Shortness of breath EXAM: CHEST - 2 VIEW COMPARISON:  Chest x-ray dated March 30, 2021 FINDINGS: Cardiac and mediastinal contours are unchanged within normal limits. Similar left greater than right bilateral linear opacities, likely due to scarring or atelectasis. Lungs are otherwise clear. No pleural effusion or pneumothorax. IMPRESSION: No active cardiopulmonary disease. Electronically Signed   By: Yetta Glassman M.D.   On: 07/25/2021 12:46    Procedures Procedures (including critical care time)  Medications Ordered in UC Medications - No data to display  Initial Impression / Assessment and Plan  / UC Course  I have reviewed the triage vital signs and the nursing notes.  Pertinent labs & imaging results that were available during my care of the patient were reviewed by me and considered in my medical decision making (see chart for details).    50 year old female presents with COPD exacerbation.  COVID and flu testing negative.  Chest x-ray was obtained and independently reviewed by me.  Interpretation: No evidence of acute cardiopulmonary disease.  Treating with albuterol, prednisone, doxycycline.  Promethazine DM for cough.  Final Clinical Impressions(s) / UC Diagnoses   Final diagnoses:  COPD exacerbation [facility])   Discharge Instructions   None    ED Prescriptions     Medication Sig Dispense Auth. Provider   albuterol (PROVENTIL) (2.5 MG/3ML) 0.083% nebulizer solution Take 3 mLs (2.5 mg total) by nebulization every 6 (six) hours as needed for wheezing or shortness of breath. 75 mL Jacoya Bauman G, DO   predniSONE (DELTASONE) 50 MG tablet 1 tablet daily x 5 days 5 tablet Allyssa Abruzzese G, DO   doxycycline (VIBRAMYCIN) 100 MG capsule Take 1 capsule (100 mg total) by mouth 2 (two) times daily. 14 capsule Eilene Voigt G, DO   promethazine-dextromethorphan (PROMETHAZINE-DM) 6.25-15 MG/5ML syrup Take 5 mLs by mouth 4 (four) times daily as needed for cough. 118 mL Coral Spikes, DO      PDMP not reviewed this encounter.   Thersa Salt [place], [place] 07/27/21 [id number]

## 2021-07-28 ENCOUNTER — Ambulatory Visit: Payer: Medicare Other | Admitting: Nurse Practitioner

## 2021-08-14 ENCOUNTER — Other Ambulatory Visit: Payer: Self-pay

## 2021-08-14 ENCOUNTER — Ambulatory Visit
Admission: EM | Admit: 2021-08-14 | Discharge: 2021-08-14 | Discharge status: Against Medical Advice | Payer: Medicare Other | Attending: Internal Medicine | Admitting: Internal Medicine

## 2021-08-16 ENCOUNTER — Other Ambulatory Visit: Payer: Self-pay

## 2021-08-16 ENCOUNTER — Ambulatory Visit (INDEPENDENT_AMBULATORY_CARE_PROVIDER_SITE_OTHER): Payer: Medicare Other | Admitting: Nurse Practitioner

## 2021-08-16 ENCOUNTER — Encounter: Payer: Self-pay | Admitting: Nurse Practitioner

## 2021-08-16 ENCOUNTER — Ambulatory Visit: Payer: Self-pay | Admitting: *Deleted

## 2021-08-16 VITALS — BP 146/92 | HR 89 | Temp 98.4°F | Wt 171.6 lb

## 2021-08-16 DIAGNOSIS — M542 Cervicalgia: Secondary | ICD-10-CM | POA: Diagnosis not present

## 2021-08-16 DIAGNOSIS — H6691 Otitis media, unspecified, right ear: Secondary | ICD-10-CM | POA: Diagnosis not present

## 2021-08-16 DIAGNOSIS — E1159 Type 2 diabetes mellitus with other circulatory complications: Secondary | ICD-10-CM

## 2021-08-16 DIAGNOSIS — I152 Hypertension secondary to endocrine disorders: Secondary | ICD-10-CM | POA: Diagnosis not present

## 2021-08-16 DIAGNOSIS — J069 Acute upper respiratory infection, unspecified: Secondary | ICD-10-CM | POA: Diagnosis not present

## 2021-08-16 LAB — VERITOR FLU A/B WAIVED
Influenza A: NEGATIVE
Influenza B: NEGATIVE

## 2021-08-16 MED ORDER — PREDNISONE 10 MG PO TABS: ORAL_TABLET | ORAL | 0 refills | Status: DC

## 2021-08-16 MED ORDER — DOXYCYCLINE HYCLATE 100 MG PO TABS: 100.0000 mg | ORAL_TABLET | Freq: Two times a day (BID) | ORAL | 0 refills | Status: DC

## 2021-08-16 MED ORDER — ONDANSETRON 4 MG PO TBDP: 4.0000 mg | ORAL_TABLET | Freq: Three times a day (TID) | ORAL | 0 refills | Status: DC | PRN

## 2021-08-16 NOTE — Telephone Encounter (Signed)
Patient is calling to report she has elevated BP- patient states she is not taking medication regularly due to COPD exacerbation. Patient was seen at ED and tested for respiratory illness 3 weeks ago- all negative tests- but she has not improved. Patient is complaining about her circulation and cough. Patient advised she neds to treat her symptoms and take her Rx medication regularly. Appointment scheduled.

## 2021-08-16 NOTE — Telephone Encounter (Signed)
Reason for Disposition  Systolic BP  >= 548 OR Diastolic >= 628  Answer Assessment - Initial Assessment Questions 1. BLOOD PRESSURE: "What is the blood pressure?" "Did you take at least two measurements 5 minutes apart?"     154/92, 168/100 2. ONSET: "When did you take your blood pressure?"     This morning- 5:45 3. HOW: "How did you obtain the blood pressure?" (e.g., visiting nurse, automatic home BP monitor)     Automatic- wrist 4. HISTORY: "Do you have a history of high blood pressure?"     yes 5. MEDICATIONS: "Are you taking any medications for blood pressure?" "Have you missed any doses recently?"     Yes- missed dosing- skipping medications 6. OTHER SYMPTOMS: "Do you have any symptoms?" (e.g., headache, chest pain, blurred vision, difficulty breathing, weakness)     Poor circulation- numbness and tingling in extremities at night, blurred vision several days 7. PREGNANCY: "Is there any chance you are pregnant?" "When was your last menstrual period?"     na  Protocols used: Blood Pressure - High-A-AH

## 2021-08-16 NOTE — Progress Notes (Signed)
Established Patient Office Visit  Subjective:  Patient ID: Catherine Giles, female    DOB: 10/11/1971  Age: 50 y.o. MRN: [id number]  CC:  Chief Complaint  Patient presents with   Hypertension   URI    HPI Catherine Giles presents for elevated blood pressure. She has not taken any of her medications since Saturday due to overall not feeling well. She also endorses mild neck pain with numbness and tingling to both arms. The symptoms are worse laying down and sometimes wake her up in sleep.   UPPER RESPIRATORY TRACT INFECTION  Worst symptom: most concerned about elevated blood pressure Fever: no Cough: yes - productive, yellow, thick Shortness of breath: yes Wheezing: no Chest pain: no Chest tightness: yes Chest congestion: no Nasal congestion: yes Runny nose:  mild Post nasal drip: no Sneezing: no Sore throat: no Swollen glands: no Sinus pressure: yes Headache: yes Face pain: no Toothache: no Ear pain: yes left Ear pressure: no  Eyes red/itching:no Eye drainage/crusting: no  Vomiting: no Rash: no Fatigue: yes Sick contacts: yes - daughter who is in 3rd grade Strep contacts: no  Context: fluctuating Recurrent sinusitis: no Relief with OTC cold/cough medications:  none taken   Treatments attempted:  tea, lemon, honey     Past Medical History:  Diagnosis Date   Arthritis    RA   Asthma    COPD (chronic obstructive pulmonary disease) ([place])    Depression    Diabetes mellitus without complication (HCC)    Emphysema of lung (HCC)    Family history of breast cancer    6/21 cancer genetic testing letter sent   Fibromyalgia    Hypertension    Lupus ([place])    Pre-diabetes    Thyroid disease     Past Surgical History:  Procedure Laterality Date   MOUTH SURGERY      Family History  Problem Relation Age of Onset   Lung cancer Mother    Diabetes Mother    Breast cancer Mother 48   Hypertension Father    Cancer Maternal Aunt    Ovarian cancer Maternal Aunt     Diabetes Maternal Aunt    Diabetes Maternal Uncle    Hypertension Maternal Grandmother    Diabetes Maternal Grandmother     Social History   Socioeconomic History   Marital status: Divorced    Spouse name: Not on file   Number of children: Not on file   Years of education: Not on file   Highest education level: Not on file  Occupational History   Not on file  Tobacco Use   Smoking status: Every Day    Packs/day: 1.00    Years: 34.00    Pack years: 34.00    Types: Cigarettes   Smokeless tobacco: Never   Tobacco comments:    2-3 cig/day  Vaping Use   Vaping Use: Never used  Substance and Sexual Activity   Alcohol use: Yes    Comment: Occ   Drug use: Not Currently   Sexual activity: Yes  Other Topics Concern   Not on file  Social History Narrative   ** Merged History Encounter **       Social Determinants of Health   Financial Resource Strain: Medium Risk   Difficulty of Paying Living Expenses: Somewhat hard  Food Insecurity: Food Insecurity Present   Worried About Running Out of Food in the Last Year: Sometimes true   Ran Out of Food in the Last Year: Sometimes true  Transportation Needs: No Data processing manager (Medical): No   Lack of Transportation (Non-Medical): No  Physical Activity: Inactive   Days of Exercise per Week: 0 days   Minutes of Exercise per Session: 0 min  Stress: Stress Concern Present   Feeling of Stress : To some extent  Social Connections: Moderately Isolated   Frequency of Communication with Friends and Family: More than three times a week   Frequency of Social Gatherings with Friends and Family: More than three times a week   Attends Religious Services: Never   Marine scientist or Organizations: No   Attends Music therapist: Never   Marital Status: Married  Human resources officer Violence: Not At Risk   Fear of Current or Ex-Partner: No   Emotionally Abused: No   Physically Abused: No    Sexually Abused: No    Outpatient Medications Prior to Visit  Medication Sig Dispense Refill   albuterol (PROVENTIL) (2.5 MG/3ML) 0.083% nebulizer solution Take 3 mLs (2.5 mg total) by nebulization every 6 (six) hours as needed for wheezing or shortness of breath. 75 mL 12   amLODipine (NORVASC) 5 MG tablet Take 1 tablet (5 mg total) by mouth daily. 90 tablet 3   Blood Glucose Monitoring Suppl (ONETOUCH VERIO) w/Device KIT Use to check blood sugar 3 times daily with goal <130 fasting and <180 two hours after meal.  Bring meter to visits. 1 kit 0   cyproheptadine (PERIACTIN) 4 MG tablet Take 1 tablet (4 mg total) by mouth at bedtime. 30 tablet 5   dapagliflozin propanediol (FARXIGA) 10 MG TABS tablet Take 1 tablet (10 mg total) by mouth daily before breakfast. 90 tablet 4   DULoxetine (CYMBALTA) 30 MG capsule Start out with 30 mg (one tablet) once daily by mouth for 1 week, then increase to 60 mg (two tablets) once daily by mouth. 180 capsule 4   fexofenadine (ALLEGRA ALLERGY) 180 MG tablet Take 1 tablet (180 mg total) by mouth daily. 15 tablet 0   fluticasone (FLONASE) 50 MCG/ACT nasal spray Place 2 sprays into both nostrils daily. 16 g 0   Fluticasone-Umeclidin-Vilant (TRELEGY ELLIPTA) 100-62.5-25 MCG/INH AEPB Inhale 1 puff into the lungs daily.     glucose blood test strip Use to check blood sugar 3 times daily with goal <130 fasting and <180 two hours after meal. 100 each 12   golimumab (SIMPONI ARIA) 50 MG/4ML SOLN injection Inject into the vein.     ibuprofen (ADVIL) 400 MG tablet Take 1 tablet (400 mg total) by mouth every 8 (eight) hours as needed. 30 tablet 0   Lactobacillus PACK Take 1 each by mouth as needed (for diarrhea.). 20 each 0   Lancets (ONETOUCH ULTRASOFT) lancets Use to check blood sugar 3 times daily with goal <130 fasting and <180 two hours after meal. 100 each 12   leflunomide (ARAVA) 20 MG tablet Take 20 mg by mouth daily.     levothyroxine (SYNTHROID) 50 MCG tablet Take  50 mcg by mouth daily before breakfast.      liothyronine (CYTOMEL) 5 MCG tablet Take 5 mcg by mouth daily.     losartan (COZAAR) 100 MG tablet Take 1 tablet (100 mg total) by mouth daily. 90 tablet 0   metFORMIN (GLUCOPHAGE) 500 MG tablet Take 1 tablet (500 mg total) by mouth 2 (two) times daily with a meal. 180 tablet 3   montelukast (SINGULAIR) 10 MG tablet Take 10 mg by mouth at bedtime.  nicotine (NICOTINE STEP 1) 21 mg/24hr patch Place 1 patch (21 mg total) onto the skin daily. 28 patch 0   tiZANidine (ZANAFLEX) 4 MG tablet Take 1 tablet (4 mg total) by mouth every 8 (eight) hours as needed for muscle spasms. 90 tablet 0   triamcinolone cream (KENALOG) 0.1 % Apply 1 application topically 2 (two) times daily. 30 g 0   albuterol (VENTOLIN HFA) 108 (90 Base) MCG/ACT inhaler Inhale 2 puffs into the lungs every 4 (four) hours as needed.      ipratropium-albuterol (DUONEB) 0.5-2.5 (3) MG/3ML SOLN Take 3 mLs by nebulization every 6 (six) hours as needed for up to 7 days. 360 mL 0   doxycycline (VIBRAMYCIN) 100 MG capsule Take 1 capsule (100 mg total) by mouth 2 (two) times daily. 14 capsule 0   predniSONE (DELTASONE) 50 MG tablet 1 tablet daily x 5 days 5 tablet 0   promethazine-dextromethorphan (PROMETHAZINE-DM) 6.25-15 MG/5ML syrup Take 5 mLs by mouth 4 (four) times daily as needed for cough. 118 mL 0   No facility-administered medications prior to visit.    Allergies  Allergen Reactions   Azithromycin Anaphylaxis   Penicillins     Did it involve swelling of the face/tongue/throat, SOB, or low BP? Yes Did it involve sudden or severe rash/hives, skin peeling, or any reaction on the inside of your mouth or nose? No Did you need to seek medical attention at a hospital or doctor's office? No When did it last happen?       If all above answers are "NO", may proceed with cephalosporin use.    Shellfish Allergy     ROS Review of Systems  Constitutional:  Positive for fatigue. Negative  for fever.  HENT:  Positive for congestion, ear pain, rhinorrhea and sinus pressure. Negative for postnasal drip and sore throat.   Respiratory:  Positive for cough, chest tightness and shortness of breath.   Cardiovascular: Negative.   Gastrointestinal:  Positive for nausea. Negative for abdominal pain, constipation, diarrhea and vomiting.  Genitourinary: Negative.   Musculoskeletal:  Positive for neck pain (mild).  Skin: Negative.   Neurological:  Positive for numbness (and tingling to her arms).  Psychiatric/Behavioral: Negative.       Objective:    Physical Exam Vitals and nursing note reviewed.  Constitutional:      General: She is not in acute distress.    Appearance: Normal appearance.  HENT:     Head: Normocephalic and atraumatic.     Right Ear: Ear canal and external ear normal. Tympanic membrane is erythematous.     Left Ear: Tympanic membrane, ear canal and external ear normal.  Eyes:     Conjunctiva/sclera: Conjunctivae normal.  Cardiovascular:     Rate and Rhythm: Normal rate and regular rhythm.     Pulses: Normal pulses.     Heart sounds: Normal heart sounds.  Pulmonary:     Effort: Pulmonary effort is normal.     Breath sounds: Normal breath sounds.  Abdominal:     Palpations: Abdomen is soft.     Tenderness: There is no abdominal tenderness.  Musculoskeletal:        General: No tenderness. Normal range of motion.     Cervical back: Normal range of motion and neck supple.  Lymphadenopathy:     Cervical: No cervical adenopathy.  Skin:    General: Skin is warm and dry.  Neurological:     General: No focal deficit present.     Mental Status:  She is alert and oriented to person, place, and time.  Psychiatric:        Mood and Affect: Mood normal.        Behavior: Behavior normal.        Thought Content: Thought content normal.        Judgment: Judgment normal.    BP (!) 146/92 (BP Location: Left Arm, Patient Position: Sitting)   Pulse 89   Temp 98.4 F  (36.9 C) (Oral)   Wt 171 lb 9.6 oz (77.8 kg)   SpO2 93%   BMI 31.39 kg/m  Wt Readings from Last 3 Encounters:  08/16/21 171 lb 9.6 oz (77.8 kg)  07/25/21 169 lb 5 oz (76.8 kg)  06/02/21 169 lb 6.4 oz (76.8 kg)     Health Maintenance Due  Topic Date Due   OPHTHALMOLOGY EXAM  Never done   MAMMOGRAM  Never done   COVID-19 Vaccine (3 - Pfizer risk series) 06/18/2020   INFLUENZA VACCINE  Never done    There are no preventive care reminders to display for this patient.  Lab Results  Component Value Date   TSH 0.614 01/24/2021   Lab Results  Component Value Date   WBC 8.6 06/02/2021   HGB 15.9 06/02/2021   HCT 48.6 (H) 06/02/2021   MCV 95 06/02/2021   PLT 348 06/02/2021   Lab Results  Component Value Date   NA 141 06/02/2021   K 4.3 06/02/2021   CO2 21 06/02/2021   GLUCOSE 112 (H) 06/02/2021   BUN 9 06/02/2021   CREATININE 0.48 (L) 06/02/2021   BILITOT 0.3 06/02/2021   ALKPHOS 80 06/02/2021   AST 20 06/02/2021   ALT 22 06/02/2021   PROT 7.5 06/02/2021   ALBUMIN 4.7 06/02/2021   CALCIUM 9.7 06/02/2021   ANIONGAP 11 08/11/2020   EGFR 114 06/02/2021   Lab Results  Component Value Date   CHOL 170 06/02/2021   Lab Results  Component Value Date   HDL 71 06/02/2021   Lab Results  Component Value Date   LDLCALC 78 06/02/2021   Lab Results  Component Value Date   TRIG 118 06/02/2021   No results found for: CHOLHDL Lab Results  Component Value Date   HGBA1C 6.3 06/02/2021      Assessment & Plan:   Problem List Items Addressed This Visit       Cardiovascular and Mediastinum   Hypertension associated with diabetes ([place])    Blood pressure elevated on today's visit. She has not taken any of her medications since Saturday due to nausea and not feeling well. Discussed not changing doses or medications at this time and that her BP is elevated due to missing 4 days of medications. Will give her zofran to help with nausea so she can hopefully restart her BP  meds asap. No red flags on exam. Continue monitoring blood pressure daily and call if blood pressure still elevated 1 week after re-starting medications.         Other   Neck pain    Pain in her neck with tingling and numbness down her arms for the last few weeks. No red flags on exam. Symptoms worse at night. Will see if her symptoms improve with prednisone for respiratory illness. Keep next scheduled appointment with PCP in November.       Other Visit Diagnoses     Upper respiratory tract infection, unspecified type    -  Primary   Symptoms x4 days. Will check flu  and covid-19. Will treat with prednisone and zofran for nausea. F/U if symptoms don't improve.    Relevant Orders   Novel Coronavirus, NAA ([facility])   Influenza A & B ([facility])   Acute infection of right ear       Will treat with doxycycline. Encouraged increased fluids. Can use flonase.        Meds ordered this encounter  Medications   doxycycline (VIBRA-TABS) 100 MG tablet    Sig: Take 1 tablet (100 mg total) by mouth 2 (two) times daily.    Dispense:  20 tablet    Refill:  0   ondansetron (ZOFRAN ODT) 4 MG disintegrating tablet    Sig: Take 1 tablet (4 mg total) by mouth every 8 (eight) hours as needed for nausea or vomiting.    Dispense:  20 tablet    Refill:  0   predniSONE (DELTASONE) 10 MG tablet    Sig: Take 6 tablets today, then 5 tablets tomorrow, then decrease by 1 tablet every day until gone    Dispense:  21 tablet    Refill:  0     Follow-up: Return if symptoms worsen or fail to improve.    Charyl Dancer, NP

## 2021-08-16 NOTE — Assessment & Plan Note (Signed)
Blood pressure elevated on today's visit. She has not taken any of her medications since Saturday due to nausea and not feeling well. Discussed not changing doses or medications at this time and that her BP is elevated due to missing 4 days of medications. Will give her zofran to help with nausea so she can hopefully restart her BP meds asap. No red flags on exam. Continue monitoring blood pressure daily and call if blood pressure still elevated 1 week after re-starting medications.

## 2021-08-16 NOTE — Assessment & Plan Note (Signed)
Pain in her neck with tingling and numbness down her arms for the last few weeks. No red flags on exam. Symptoms worse at night. Will see if her symptoms improve with prednisone for respiratory illness. Keep next scheduled appointment with PCP in November.

## 2021-08-17 LAB — SARS-COV-2, NAA 2 DAY TAT

## 2021-08-17 LAB — NOVEL CORONAVIRUS, NAA: SARS-CoV-2, NAA: NOT DETECTED

## 2021-08-21 DIAGNOSIS — Z20822 Contact with and (suspected) exposure to covid-19: Secondary | ICD-10-CM | POA: Diagnosis not present

## 2021-09-02 ENCOUNTER — Ambulatory Visit: Payer: Medicare Other | Admitting: Nurse Practitioner

## 2021-09-05 ENCOUNTER — Ambulatory Visit (INDEPENDENT_AMBULATORY_CARE_PROVIDER_SITE_OTHER): Payer: Medicare Other | Admitting: Nurse Practitioner

## 2021-09-05 ENCOUNTER — Encounter: Payer: Self-pay | Admitting: Nurse Practitioner

## 2021-09-05 ENCOUNTER — Other Ambulatory Visit: Payer: Self-pay

## 2021-09-05 VITALS — BP 148/92 | HR 90 | Temp 98.3°F | Wt 173.6 lb

## 2021-09-05 DIAGNOSIS — I152 Hypertension secondary to endocrine disorders: Secondary | ICD-10-CM | POA: Diagnosis not present

## 2021-09-05 DIAGNOSIS — M797 Fibromyalgia: Secondary | ICD-10-CM

## 2021-09-05 DIAGNOSIS — E559 Vitamin D deficiency, unspecified: Secondary | ICD-10-CM

## 2021-09-05 DIAGNOSIS — E1169 Type 2 diabetes mellitus with other specified complication: Secondary | ICD-10-CM | POA: Diagnosis not present

## 2021-09-05 DIAGNOSIS — E1159 Type 2 diabetes mellitus with other circulatory complications: Secondary | ICD-10-CM

## 2021-09-05 DIAGNOSIS — F320 Major depressive disorder, single episode, mild: Secondary | ICD-10-CM

## 2021-09-05 DIAGNOSIS — J432 Centrilobular emphysema: Secondary | ICD-10-CM

## 2021-09-05 DIAGNOSIS — M0579 Rheumatoid arthritis with rheumatoid factor of multiple sites without organ or systems involvement: Secondary | ICD-10-CM

## 2021-09-05 DIAGNOSIS — E039 Hypothyroidism, unspecified: Secondary | ICD-10-CM

## 2021-09-05 DIAGNOSIS — E669 Obesity, unspecified: Secondary | ICD-10-CM

## 2021-09-05 MED ORDER — BUSPIRONE HCL 5 MG PO TABS: 5.0000 mg | ORAL_TABLET | Freq: Two times a day (BID) | ORAL | 4 refills | Status: DC

## 2021-09-05 MED ORDER — AMLODIPINE BESYLATE 10 MG PO TABS: 10.0000 mg | ORAL_TABLET | Freq: Every day | ORAL | 4 refills | Status: AC

## 2021-09-05 NOTE — Assessment & Plan Note (Signed)
Chronic, ongoing.  Followed by rheumatology, continue this collaboration.  At this time continue current medications ordered by them and adjust as needed.  Continue taking Duloxetine, which is beneficial for pain and mood.  Labs today.

## 2021-09-05 NOTE — Assessment & Plan Note (Addendum)
Diagnosed in April 2022, with A1c 6.7%, was on Prednisone daily at time for RA.  Did not tolerate Metformin due to GI issues.  Continue Farxiga 10 MG daily, educated her on this medication. To continue to check BS 2-3 times daily and document for visits.  To alert provider if poor tolerance of medication.  Recheck A1c on labs today + urine ALB, adjust regimen as needed.  Consider statin in future, with underlying chronic pain would consider Rosuvastatin at low dose to start -- recent LDL 78.

## 2021-09-05 NOTE — Assessment & Plan Note (Addendum)
Ongoing with PHQ9 = 13 today, some improvement.  Denies SI/HI.  Continue Duloxetine and increase to 60 MG as needed in future, which offers benefit to both mood and underlying fibromyalgia and chronic pain.  Restart Buspar 5 MG BID to assist with anxiety.  Refills as needed. Would benefit return to therapy in future, refuses today.

## 2021-09-05 NOTE — Assessment & Plan Note (Signed)
Chronic, ongoing, followed by Dr. Honor Junes and last seen 08/05/20 -- has not returned since.  Will recheck TSH and Free T4 + thyroid antibody today.  At this time continue current medication regimen and adjust as needed.

## 2021-09-05 NOTE — Assessment & Plan Note (Signed)
Chronic, ongoing.  BP remains elevated above goal on initial and recheck.  No red flag findings on exam.  Continue Losartan 100 MG daily and increase Amlodipine to 10 MG daily.  Recommend she monitor BP at least a few mornings a week at home and document for visits.  DASH diet at home.  Labs: CBC, CMP, urine ALB, and lipid.  Referral to cardiology to further assist in this more complex case, also has ongoing SOB -- would benefit to ensure no cardiac etiology.  Return in 4 weeks.

## 2021-09-05 NOTE — Assessment & Plan Note (Signed)
Chronic, check Vit D level today and continue supplement.  Recommend she take this consistently.

## 2021-09-05 NOTE — Assessment & Plan Note (Signed)
Chronic, ongoing, followed by rheumatology.  Continue this collaboration and medication regimen as prescribed by them + Duloxetine as ordered by PCP for mood and pain.  Adjust regimen as needed.

## 2021-09-05 NOTE — Patient Instructions (Signed)

## 2021-09-05 NOTE — Progress Notes (Signed)
BP (!) 148/92 (BP Location: Left Arm)   Pulse 90   Temp 98.3 F (36.8 C) (Oral)   Wt 173 lb 9.6 oz (78.7 kg)   SpO2 97%   BMI 31.75 kg/m    Subjective:    Patient ID: Catherine Giles, female    DOB: 1970/11/22, 50 y.o.   MRN: [id number]  HPI: Catherine Giles is a 50 y.o. female  Chief Complaint  Patient presents with   Diabetes    Patient states she is doing well with Diabetes. Patient states she is in need of a Diabetic Eye Exam as her vision is becoming effected.    Mood     Patient states she is here, but she is not having the best of days.    Medication Management   DIABETES Continues on Farxiga 10 MG daily.  A1c in August was 6.3%. Hypoglycemic episodes:no Polydipsia/polyuria: no Visual disturbance: no Chest pain: no Paresthesias: no Glucose Monitoring: yes  Accucheck frequency: Daily  Fasting glucose: 100 range  Post prandial:  Evening:  Before meals: Taking Insulin?: no  Long acting insulin:  Short acting insulin: Blood Pressure Monitoring: daily Retinal Examination: Up to Date Foot Exam: Up to Date Pneumovax: Not Up To Date Influenza: Not up to Date Aspirin: no   HYPERTENSION / HYPERLIPIDEMIA Continues on Losartan and Amlodipine, no statin at this time = reports she has never tried. Satisfied with current treatment? yes Duration of hypertension: chronic BP monitoring frequency: daily BP range: occasional elevations BP medication side effects: no Past BP meds: as above Duration of hyperlipidemia: chronic Medication compliance: good compliance Aspirin: no Recent stressors: no Recurrent headaches: no Visual changes: no Palpitations: no Dyspnea: yes Chest pain: no Lower extremity edema: no Dizzy/lightheaded: no   COPD Continues on Trelegy and Albuterol as needed.  Followed by Dr. Raul Del. COPD status: stable Satisfied with current treatment?: yes Oxygen use: no Dyspnea frequency:  Cough frequency:  Rescue inhaler frequency:   Limitation of  activity: no Productive cough:  Last Spirometry:  Pneumovax: Not up to Date Influenza: Not up to Date -- made her sick  DEPRESSION Continues on Duloxetine, which she reports is helping mood.  Took Buspar in the past, which she reports did offer some benefit to anxiety. Mood status: stable Satisfied with current treatment?: yes Symptom severity: moderate  Duration of current treatment : chronic Side effects: no Medication compliance: good compliance Psychotherapy/counseling: none Depressed mood: yes Anxious mood: no Anhedonia: no Significant weight loss or gain: no Insomnia: none Fatigue: no Feelings of worthlessness or guilt: no Impaired concentration/indecisiveness: no Suicidal ideations: no Hopelessness: no Crying spells: no Depression screen [facility] 2/9 09/05/2021 06/02/2021 04/15/2021 03/08/2021 01/24/2021  Decreased Interest 1 3 2 3 3   Down, Depressed, Hopeless 1 3 2 3 2   PHQ - 2 Score 2 6 4 6 5   Altered sleeping 2 3 3 2 3   Tired, decreased energy 2 3 3 3 3   Change in appetite 2 3 3  0 1  Feeling bad or failure about yourself  2 3 0 0 0  Trouble concentrating 2 3 1 3 3   Moving slowly or fidgety/restless 1 2 0 1 1  Suicidal thoughts 0 0 0 0 0  PHQ-9 Score 13 23 14 15 16   Difficult doing work/chores Not difficult at all Very difficult Somewhat difficult Somewhat difficult Somewhat difficult    GAD 7 : Generalized Anxiety Score 09/05/2021 06/02/2021 05/20/2019  Nervous, Anxious, on Edge 2 3 3   Control/stop worrying 2 3 2  Worry too much - different things 2 3 3   Trouble relaxing 2 3 3   Restless 1 2 1   Easily annoyed or irritable 2 3 3   Afraid - awful might happen 0 3 1  Total GAD 7 Score 11 20 16   Anxiety Difficulty Not difficult at all Very difficult Somewhat difficult   CHRONIC PAIN WITH RA Followed by rheumatology for RA with last visit 03/03/21 -- continues infusions (Golimumab) + Arava.   Pain control status: stable Duration: chronic Location: arms and hands are the  worst -- legs Quality: dull, aching, burning and throbbing Current Pain Level: 6/10 Previous Pain Level: 10/10 What Activities task can be accomplished with current medication? House work at times but can not work Previous pain specialty evaluation: no Non-narcotic analgesic meds: yes   Relevant past medical, surgical, family and social history reviewed and updated as indicated. Interim medical history since our last visit reviewed. Allergies and medications reviewed and updated.  Review of Systems  Constitutional:  Negative for activity change, appetite change, diaphoresis, fatigue and fever.  Respiratory:  Positive for shortness of breath. Negative for cough, chest tightness and wheezing.   Cardiovascular:  Negative for chest pain, palpitations and leg swelling.  Endocrine: Negative for cold intolerance, heat intolerance, polydipsia, polyphagia and polyuria.  Musculoskeletal:  Positive for arthralgias.  Neurological: Negative.   Psychiatric/Behavioral:  Negative for decreased concentration, self-injury, sleep disturbance and suicidal ideas. The patient is nervous/anxious.    Per HPI unless specifically indicated above     Objective:    BP (!) 148/92 (BP Location: Left Arm)   Pulse 90   Temp 98.3 F (36.8 C) (Oral)   Wt 173 lb 9.6 oz (78.7 kg)   SpO2 97%   BMI 31.75 kg/m   Wt Readings from Last 3 Encounters:  09/05/21 173 lb 9.6 oz (78.7 kg)  08/16/21 171 lb 9.6 oz (77.8 kg)  07/25/21 169 lb 5 oz (76.8 kg)    Physical Exam Vitals and nursing note reviewed.  Constitutional:      General: She is awake. She is not in acute distress.    Appearance: She is well-developed and well-groomed. She is obese. She is not ill-appearing or toxic-appearing.  HENT:     Head: Normocephalic.     Right Ear: Hearing normal.     Left Ear: Hearing normal.  Eyes:     General: Lids are normal.        Right eye: No discharge.        Left eye: No discharge.     Conjunctiva/sclera:  Conjunctivae normal.     Pupils: Pupils are equal, round, and reactive to light.  Neck:     Thyroid: No thyromegaly.     Vascular: No carotid bruit.  Cardiovascular:     Rate and Rhythm: Normal rate and regular rhythm.     Heart sounds: Normal heart sounds. No murmur heard.   No gallop.  Pulmonary:     Effort: Pulmonary effort is normal. No accessory muscle usage or respiratory distress.     Breath sounds: Normal breath sounds.  Abdominal:     General: Bowel sounds are normal.     Palpations: Abdomen is soft. There is no hepatomegaly or splenomegaly.  Musculoskeletal:     Cervical back: Normal range of motion and neck supple.     Right lower leg: No edema.     Left lower leg: No edema.  Skin:    General: Skin is warm and dry.  Neurological:  Mental Status: She is alert and oriented to person, place, and time.  Psychiatric:        Attention and Perception: Attention normal.        Mood and Affect: Mood normal.        Speech: Speech normal.        Behavior: Behavior normal. Behavior is cooperative.        Thought Content: Thought content normal.   Results for orders placed or performed in visit on 08/16/21  Novel Coronavirus, NAA ([facility])   Specimen: Nasopharyngeal(NP) swabs in vial transport medium  Result Value Ref Range   SARS-CoV-2, NAA Not Detected Not Detected  SARS-COV-2, NAA 2 DAY TAT  Result Value Ref Range   SARS-CoV-2, NAA 2 DAY TAT Performed   Influenza A & B (STAT)  Result Value Ref Range   Influenza A Negative Negative   Influenza B Negative Negative      Assessment & Plan:   Problem List Items Addressed This Visit       Cardiovascular and Mediastinum   Hypertension associated with diabetes ([place])    Chronic, ongoing.  BP remains elevated above goal on initial and recheck.  No red flag findings on exam.  Continue Losartan 100 MG daily and increase Amlodipine to 10 MG daily.  Recommend she monitor BP at least a few mornings a week at home and document  for visits.  DASH diet at home.  Labs: CBC, CMP, urine ALB, and lipid.  Referral to cardiology to further assist in this more complex case, also has ongoing SOB -- would benefit to ensure no cardiac etiology.  Return in 4 weeks.       Relevant Medications   amLODipine (NORVASC) 10 MG tablet   Other Relevant Orders   CBC with Differential/Platelet   Comprehensive metabolic panel   Lipid Panel w/o Chol/HDL Ratio   Bayer DCA Hb A1c Waived   Ambulatory referral to Cardiology     Respiratory   Centrilobular emphysema (HCC)    Chronic, ongoing.  Continue current inhaler regimen and adjust as needed. Recommend to continue collaboration with pulmonary and instructed her to schedule follow-up with Dr. Raul Del, especially with her ongoing complaint of SOB.  CBC today.          Endocrine   Hypothyroid    Chronic, ongoing, followed by Dr. Honor Junes and last seen 08/05/20 -- has not returned since.  Will recheck TSH and Free T4 + thyroid antibody today.  At this time continue current medication regimen and adjust as needed.        Relevant Orders   T4, free   TSH   Thyroid peroxidase antibody   Type 2 diabetes mellitus with obesity ([place]) - Primary    Diagnosed in April 2022, with A1c 6.7%, was on Prednisone daily at time for RA.  Did not tolerate Metformin due to GI issues.  Continue Farxiga 10 MG daily, educated her on this medication. To continue to check BS 2-3 times daily and document for visits.  To alert provider if poor tolerance of medication.  Recheck A1c on labs today + urine ALB, adjust regimen as needed.  Consider statin in future, with underlying chronic pain would consider Rosuvastatin at low dose to start -- recent LDL 78.      Relevant Orders   Bayer DCA Hb A1c Waived   Microalbumin, Urine Waived     Musculoskeletal and Integument   Rheumatoid arthritis involving multiple sites with positive rheumatoid factor ([place])  Chronic, ongoing.  Followed by rheumatology, continue this  collaboration.  At this time continue current medications ordered by them and adjust as needed.  Continue taking Duloxetine, which is beneficial for pain and mood.  Labs today.        Other   Depression, major, single episode, mild ([place])    Ongoing with PHQ9 = 13 today, some improvement.  Denies SI/HI.  Continue Duloxetine and increase to 60 MG as needed in future, which offers benefit to both mood and underlying fibromyalgia and chronic pain.  Restart Buspar 5 MG BID to assist with anxiety.  Refills as needed. Would benefit return to therapy in future, refuses today.      Relevant Medications   busPIRone (BUSPAR) 5 MG tablet   Fibromyalgia    Chronic, ongoing, followed by rheumatology.  Continue this collaboration and medication regimen as prescribed by them + Duloxetine as ordered by PCP for mood and pain.  Adjust regimen as needed.      Vitamin D deficiency    Chronic, check Vit D level today and continue supplement.  Recommend she take this consistently.      Relevant Orders   VITAMIN D 25 Hydroxy (Vit-D Deficiency, Fractures)     Follow up plan: Return in about 4 weeks (around 10/03/2021) for MOOD and HTN.

## 2021-09-05 NOTE — Assessment & Plan Note (Signed)
Chronic, ongoing.  Continue current inhaler regimen and adjust as needed. Recommend to continue collaboration with pulmonary and instructed her to schedule follow-up with Dr. Raul Del, especially with her ongoing complaint of SOB.  CBC today.

## 2021-09-06 ENCOUNTER — Encounter: Payer: Self-pay | Admitting: Nurse Practitioner

## 2021-09-06 ENCOUNTER — Other Ambulatory Visit: Payer: Self-pay | Admitting: Nurse Practitioner

## 2021-09-06 DIAGNOSIS — D729 Disorder of white blood cells, unspecified: Secondary | ICD-10-CM

## 2021-09-06 LAB — COMPREHENSIVE METABOLIC PANEL
ALT: 22 IU/L (ref 0–32)
AST: 15 IU/L (ref 0–40)
Albumin/Globulin Ratio: 1.6 (ref 1.2–2.2)
Albumin: 4.4 g/dL (ref 3.8–4.9)
Alkaline Phosphatase: 90 IU/L (ref 44–121)
BUN/Creatinine Ratio: 26 — ABNORMAL HIGH (ref 9–23)
BUN: 16 mg/dL (ref 6–24)
Bilirubin Total: 0.3 mg/dL (ref 0.0–1.2)
CO2: 23 mmol/L (ref 20–29)
Calcium: 9.6 mg/dL (ref 8.7–10.2)
Chloride: 103 mmol/L (ref 96–106)
Creatinine, Ser: 0.62 mg/dL (ref 0.57–1.00)
Globulin, Total: 2.8 g/dL (ref 1.5–4.5)
Glucose: 115 mg/dL — ABNORMAL HIGH (ref 70–99)
Potassium: 3.8 mmol/L (ref 3.5–5.2)
Sodium: 141 mmol/L (ref 134–144)
Total Protein: 7.2 g/dL (ref 6.0–8.5)
eGFR: 107 mL/min/{1.73_m2} (ref >59)

## 2021-09-06 LAB — LIPID PANEL W/O CHOL/HDL RATIO
Cholesterol, Total: 154 mg/dL (ref 100–199)
HDL: 72 mg/dL (ref >39)
LDL Chol Calc (NIH): 47 mg/dL (ref 0–99)
Triglycerides: 226 mg/dL — ABNORMAL HIGH (ref 0–149)
VLDL Cholesterol Cal: 35 mg/dL (ref 5–40)

## 2021-09-06 LAB — CBC WITH DIFFERENTIAL/PLATELET
Basophils Absolute: 0.1 10*3/uL (ref 0.0–0.2)
Basos: 0 %
EOS (ABSOLUTE): 0.1 10*3/uL (ref 0.0–0.4)
Eos: 1 %
Hematocrit: 46.6 % (ref 34.0–46.6)
Hemoglobin: 15.5 g/dL (ref 11.1–15.9)
Immature Grans (Abs): 0 10*3/uL (ref 0.0–0.1)
Immature Granulocytes: 0 %
Lymphocytes Absolute: 4.4 10*3/uL — ABNORMAL HIGH (ref 0.7–3.1)
Lymphs: 31 %
MCH: 31.1 pg (ref 26.6–33.0)
MCHC: 33.3 g/dL (ref 31.5–35.7)
MCV: 93 fL (ref 79–97)
Monocytes Absolute: 1 10*3/uL — ABNORMAL HIGH (ref 0.1–0.9)
Monocytes: 7 %
Neutrophils Absolute: 8.7 10*3/uL — ABNORMAL HIGH (ref 1.4–7.0)
Neutrophils: 61 %
Platelets: 341 10*3/uL (ref 150–450)
RBC: 4.99 x10E6/uL (ref 3.77–5.28)
RDW: 12.2 % (ref 11.7–15.4)
WBC: 14.3 10*3/uL — ABNORMAL HIGH (ref 3.4–10.8)

## 2021-09-06 LAB — T4, FREE: Free T4: 1.1 ng/dL (ref 0.82–1.77)

## 2021-09-06 LAB — THYROID PEROXIDASE ANTIBODY: Thyroperoxidase Ab SerPl-aCnc: 310 IU/mL — ABNORMAL HIGH (ref 0–34)

## 2021-09-06 LAB — TSH: TSH: 2.73 u[IU]/mL (ref 0.450–4.500)

## 2021-09-06 LAB — MICROALBUMIN, URINE WAIVED
Creatinine, Urine Waived: 200 mg/dL (ref 10–300)
Microalb, Ur Waived: 30 mg/L — ABNORMAL HIGH (ref 0–19)
Microalb/Creat Ratio: <30 mg/g (ref <30)

## 2021-09-06 LAB — BAYER DCA HB A1C WAIVED: HB A1C (BAYER DCA - WAIVED): 5.7 % — ABNORMAL HIGH (ref 4.8–5.6)

## 2021-09-06 LAB — VITAMIN D 25 HYDROXY (VIT D DEFICIENCY, FRACTURES): Vit D, 25-Hydroxy: 19.9 ng/mL — ABNORMAL LOW (ref 30.0–100.0)

## 2021-09-06 NOTE — Progress Notes (Signed)
Contacted via MyChart   Good morning Catherine Giles, your labs have returned: - CBC is showing some mild elevation in white blood cell count and neutrophils.  Have you been sick recently?  Any recent Prednisone use?  I would like to recheck these via outpatient labs in 4 weeks -- please schedule lab only visit to recheck. - CMP shows normal kidney function, creatinine and eGFR, + normal liver function, AST and ALT. - Cholesterol levels remain stable. - Thyroid levels show normal TSH and Free T4, thyroid antibody is slightly elevated showing more Hashimoto's as probable cause for thyroid issues -- we continue to treat this with Levothyroxine, but I would read up online as some people also add diet changes with Hashimoto's to help thyroid. - Vitamin D level is a little low. I recommend you start taking Vitamin D3 2000 units daily, which you can obtain over the counter.  Any questions? Keep being amazing!!  Thank you for allowing me to participate in your care.  I appreciate you. Kindest regards,   

## 2021-09-06 NOTE — Telephone Encounter (Signed)
Copied from [place] [id number]. Topic: General - Other >> Sep 06, 2021  9:00 AM Yvette Rack wrote: Reason for CRM: Pt requests call back to go over lab results. Cb# [phone]

## 2021-09-12 ENCOUNTER — Ambulatory Visit
Admission: EM | Admit: 2021-09-12 | Discharge: 2021-09-12 | Disposition: A | Discharge status: Home / Self Care | Payer: Medicare Other | Attending: Family Medicine | Admitting: Family Medicine

## 2021-09-12 ENCOUNTER — Encounter: Payer: Self-pay | Admitting: Emergency Medicine

## 2021-09-12 DIAGNOSIS — J111 Influenza due to unidentified influenza virus with other respiratory manifestations: Secondary | ICD-10-CM

## 2021-09-12 DIAGNOSIS — B349 Viral infection, unspecified: Secondary | ICD-10-CM

## 2021-09-12 MED ORDER — PROMETHAZINE-DM 6.25-15 MG/5ML PO SYRP: 5.0000 mL | ORAL_SOLUTION | Freq: Four times a day (QID) | ORAL | 0 refills | Status: DC | PRN

## 2021-09-12 MED ORDER — OSELTAMIVIR PHOSPHATE 75 MG PO CAPS: 75.0000 mg | ORAL_CAPSULE | Freq: Two times a day (BID) | ORAL | 0 refills | Status: DC

## 2021-09-12 NOTE — ED Provider Notes (Signed)
Roderic Palau    CSN: [id number] Arrival date & time: 09/12/21  1137      History   Chief Complaint Chief Complaint  Patient presents with   Headache   Cough    HPI Catherine Giles is a 50 y.o. female.   HPI Patient with a medical history significant for COPD, rheumatoid, fibromyalgia presents today for evaluation of 3 days of headache, cough, chills following an exposure by her daughter to influenza.  Patient is unaware if she has had any fever.  Reports she is generally feeling unwell with generalized body aches.  She has had a cough and reports using her nebulizer treatments as prescribed.  She denies any chest pain.Cough is nonproductive and she has some mild nasal drainage.  Most worrisome symptom is her headache pain.  She did take ibuprofen yesterday with minimal relief. Past Medical History:  Diagnosis Date   Arthritis    RA   Asthma    COPD (chronic obstructive pulmonary disease) ([place])    Depression    Diabetes mellitus without complication ([place])    Emphysema of lung ([place])    Family history of breast cancer    6/21 cancer genetic testing letter sent   Fibromyalgia    Hypertension    Lupus ([place])    Pre-diabetes    Thyroid disease     Patient Active Problem List   Diagnosis Date Noted   Neck pain 08/16/2021   Depression, major, single episode, mild ([place]) 01/24/2021   Memory changes 10/12/2020   Dairy allergy 10/12/2020   Headache 10/12/2020   Vaginal stenosis 03/19/2020   Obesity 03/19/2020   Insomnia 01/05/2020   Complex regional pain syndrome I of upper limb 12/17/2019   Hypertension associated with diabetes ([place]) 12/13/2019   Centrilobular emphysema ([place]) 12/13/2019   Type 2 diabetes mellitus with obesity ([place]) 12/13/2019   Hashimoto's thyroiditis 12/13/2019   DDD (degenerative disc disease), lumbar 08/26/2019   Multinodular goiter 08/14/2019   Chronic left hip pain 05/05/2019   Osteoarthritis of spine with radiculopathy, cervical region  05/05/2019   Fibromyalgia 02/10/2019   Rheumatoid arthritis involving multiple sites with positive rheumatoid factor ([place]) 02/10/2019   Cervical radiculopathy 03/13/2018   Vitamin D deficiency 10/11/2015    Past Surgical History:  Procedure Laterality Date   MOUTH SURGERY      OB History     Gravida  9   Para  6   Term  0   Preterm  0   AB  3   Living         SAB  2   IAB  0   Ectopic  0   Multiple      Live Births               Home Medications    Prior to Admission medications   Medication Sig Start Date End Date Taking? Authorizing Provider  oseltamivir (TAMIFLU) 75 MG capsule Take 1 capsule (75 mg total) by mouth 2 (two) times daily. 09/12/21  Yes Scot Jun, FNP  promethazine-dextromethorphan (PROMETHAZINE-DM) 6.25-15 MG/5ML syrup Take 5 mLs by mouth 4 (four) times daily as needed for cough. 09/12/21  Yes Scot Jun, FNP  albuterol (PROVENTIL) (2.5 MG/3ML) 0.083% nebulizer solution Take 3 mLs (2.5 mg total) by nebulization every 6 (six) hours as needed for wheezing or shortness of breath. 07/25/21   Coral Spikes, DO  albuterol (VENTOLIN HFA) 108 (90 Base) MCG/ACT inhaler Inhale 2 puffs into the lungs every  4 (four) hours as needed.  02/12/19 02/20/21  [provider]  amLODipine (NORVASC) 10 MG tablet Take 1 tablet (10 mg total) by mouth daily. 09/05/21   Cannady, Henrine Screws T, NP  Blood Glucose Monitoring Suppl (ONETOUCH VERIO) w/Device KIT Use to check blood sugar 3 times daily with goal <130 fasting and <180 two hours after meal.  Bring meter to visits. 01/25/21   Cannady, Henrine Screws T, NP  busPIRone (BUSPAR) 5 MG tablet Take 1 tablet (5 mg total) by mouth 2 (two) times daily. 09/05/21   Cannady, Henrine Screws T, NP  cyproheptadine (PERIACTIN) 4 MG tablet Take 1 tablet (4 mg total) by mouth at bedtime. 01/25/21   Kozlow, Donnamarie Poag, MD  dapagliflozin propanediol (FARXIGA) 10 MG TABS tablet Take 1 tablet (10 mg total) by mouth daily before breakfast.  03/16/21   Cannady, Henrine Screws T, NP  doxycycline (VIBRA-TABS) 100 MG tablet Take 1 tablet (100 mg total) by mouth 2 (two) times daily. 08/16/21   McElwee, Scheryl Darter, NP  DULoxetine (CYMBALTA) 30 MG capsule Start out with 30 mg (one tablet) once daily by mouth for 1 week, then increase to 60 mg (two tablets) once daily by mouth. 03/16/21   Cannady, Jolene T, NP  fexofenadine (ALLEGRA ALLERGY) 180 MG tablet Take 1 tablet (180 mg total) by mouth daily. 02/28/21   Vigg, Avanti, MD  fluticasone (FLONASE) 50 MCG/ACT nasal spray Place 2 sprays into both nostrils daily. 07/01/20   Rodriguez-Southworth, Sunday Spillers, PA-C  Fluticasone-Umeclidin-Vilant (TRELEGY ELLIPTA) 100-62.5-25 MCG/INH AEPB Inhale 1 puff into the lungs daily. 11/29/20   [provider]  glucose blood test strip Use to check blood sugar 3 times daily with goal <130 fasting and <180 two hours after meal. 01/25/21   Cannady, Jolene T, NP  golimumab (SIMPONI ARIA) 50 MG/4ML SOLN injection Inject into the vein.    [provider]  ibuprofen (ADVIL) 400 MG tablet Take 1 tablet (400 mg total) by mouth every 8 (eight) hours as needed. 07/07/21   Cannady, Henrine Screws T, NP  ipratropium-albuterol (DUONEB) 0.5-2.5 (3) MG/3ML SOLN Take 3 mLs by nebulization every 6 (six) hours as needed for up to 7 days. 06/12/20 06/19/20  Laurene Footman B, PA-C  Lactobacillus PACK Take 1 each by mouth as needed (for diarrhea.). 02/28/21   Charlynne Cousins, MD  Lancets (ONETOUCH ULTRASOFT) lancets Use to check blood sugar 3 times daily with goal <130 fasting and <180 two hours after meal. 01/25/21   Cannady, Jolene T, NP  leflunomide (ARAVA) 20 MG tablet Take 20 mg by mouth daily. 01/22/21   [provider]  levothyroxine (SYNTHROID) 50 MCG tablet Take 50 mcg by mouth daily before breakfast.     [provider]  liothyronine (CYTOMEL) 5 MCG tablet Take 5 mcg by mouth daily. 08/05/20   [provider]  losartan (COZAAR) 100 MG tablet Take 1 tablet (100 mg total)  by mouth daily. 06/02/21   McElwee, Scheryl Darter, NP  metFORMIN (GLUCOPHAGE) 500 MG tablet Take 1 tablet (500 mg total) by mouth 2 (two) times daily with a meal. 01/25/21   Cannady, Jolene T, NP  montelukast (SINGULAIR) 10 MG tablet Take 10 mg by mouth at bedtime. 12/29/20 12/29/21  [provider]  nicotine (NICOTINE STEP 1) 21 mg/24hr patch Place 1 patch (21 mg total) onto the skin daily. 06/02/21   McElwee, Lauren A, NP  ondansetron (ZOFRAN ODT) 4 MG disintegrating tablet Take 1 tablet (4 mg total) by mouth every 8 (eight) hours as needed  for nausea or vomiting. 08/16/21   McElwee, Lauren A, NP  tiZANidine (ZANAFLEX) 4 MG tablet Take 1 tablet (4 mg total) by mouth every 8 (eight) hours as needed for muscle spasms. 08/23/20   Eulogio Bear, NP  triamcinolone cream (KENALOG) 0.1 % Apply 1 application topically 2 (two) times daily. 02/20/21   Zigmund Gottron, NP  cetirizine (ZYRTEC) 10 MG tablet Take 1 tablet (10 mg total) by mouth daily. 02/20/21 02/20/21  Zigmund Gottron, NP  traZODone (DESYREL) 50 MG tablet Take 1 tablet (50 mg total) by mouth at bedtime. 01/05/20 07/01/20  Venita Lick, NP    Family History Family History  Problem Relation Age of Onset   Lung cancer Mother    Diabetes Mother    Breast cancer Mother 26   Hypertension Father    Cancer Maternal Aunt    Ovarian cancer Maternal Aunt    Diabetes Maternal Aunt    Diabetes Maternal Uncle    Hypertension Maternal Grandmother    Diabetes Maternal Grandmother     Social History Social History   Tobacco Use   Smoking status: Every Day    Packs/day: 1.00    Years: 34.00    Pack years: 34.00    Types: Cigarettes   Smokeless tobacco: Never   Tobacco comments:    2-3 cig/day  Vaping Use   Vaping Use: Never used  Substance Use Topics   Alcohol use: Yes    Comment: Occ   Drug use: Not Currently     Allergies   Azithromycin, Penicillins, and Shellfish allergy   Review of Systems Review of Systems Pertinent  negatives listed in HPI  Physical Exam Triage Vital Signs ED Triage Vitals  Enc Vitals Group     BP 09/12/21 1314 (!) 143/85     Pulse Rate 09/12/21 1314 (!) 105     Resp 09/12/21 1314 20     Temp 09/12/21 1314 97.9 F (36.6 C)     Temp Source 09/12/21 1314 Oral     SpO2 09/12/21 1314 96 %     Weight --      Height --      Head Circumference --      Peak Flow --      Pain Score 09/12/21 1328 8     Pain Loc --      Pain Edu? --      Excl. in [place]? --    No data found.  Updated Vital Signs BP (!) 143/85 (BP Location: Left Arm)   Pulse (!) 105   Temp 97.9 F (36.6 C) (Oral)   Resp 20   SpO2 96%   Visual Acuity Right Eye Distance:   Left Eye Distance:   Bilateral Distance:    Right Eye Near:   Left Eye Near:    Bilateral Near:     Physical Exam  General Appearance:    Alert, acutely ill-appearing, cooperative, no distress  HENT:   Normocephalic, ears normal, nares mucosal edema with congestion, oropharynx without erythema or edema.  Eyes:    PERRL, conjunctiva/corneas clear, EOM's intact       Lungs:     Clear to auscultation bilaterally, respirations unlabored  Heart:    Regular rate and rhythm  Neurologic:   Awake, alert, oriented x 3. No apparent focal neurological           defect.      UC Treatments / Results  Labs (all labs ordered are listed, but  only abnormal results are displayed) Labs Reviewed  COVID-19, FLU A+B NAA    EKG   Radiology No results found.  Procedures Procedures (including critical care time)  Medications Ordered in UC Medications - No data to display  Initial Impression / Assessment and Plan / UC Course  I have reviewed the triage vital signs and the nursing notes.  Pertinent labs & imaging results that were available during my care of the patient were reviewed by me and considered in my medical decision making (see chart for details).    Currently out of rapid point-of-care flu test treating and diagnosing for suspected  influenza virus given symptoms and close exposure.  Patient has within the window of Tamiflu treatment therefore will initiate Tamiflu treatment dose.  Encouraged to continue nebulizer treatments. Promethazine DM prescribed for management of cough.  Discussed red flag symptoms that warrant immediate evaluation in the setting of the ER.  Work note provided.  Patient verbalized understanding and agreement with plan. Final Clinical Impressions(s) / UC Diagnoses   Final diagnoses:  Viral illness  Influenza-like illness     Discharge Instructions      Your Flu/COVID results should result within 3-5 days. I am treating you for influenza given that your daughter tested positive and you have had a close exposure.  Start Tamiflu today.  And take as directed. Negative results are immediately resulted to Mychart. Positive results will receive a follow-up call from our clinic. If symptoms are present, I recommend home quarantine until results are known. Alternate Tylenol and ibuprofen as needed for body aches and fever.  Symptom management per recommendations discussed today.  If any breathing difficulty or chest pain develops go immediately to the closest emergency department for evaluation.    ED Prescriptions     Medication Sig Dispense Auth. Provider   promethazine-dextromethorphan (PROMETHAZINE-DM) 6.25-15 MG/5ML syrup Take 5 mLs by mouth 4 (four) times daily as needed for cough. 140 mL Scot Jun, FNP   oseltamivir (TAMIFLU) 75 MG capsule Take 1 capsule (75 mg total) by mouth 2 (two) times daily. 10 capsule Scot Jun, FNP      PDMP not reviewed this encounter.   Scot Jun, [place] 09/13/21 [phone]

## 2021-09-12 NOTE — Discharge Instructions (Addendum)
Your Flu/COVID results should result within 3-5 days. I am treating you for influenza given that your daughter tested positive and you have had a close exposure.  Start Tamiflu today.  And take as directed. Negative results are immediately resulted to Mychart. Positive results will receive a follow-up call from our clinic. If symptoms are present, I recommend home quarantine until results are known. Alternate Tylenol and ibuprofen as needed for body aches and fever.  Symptom management per recommendations discussed today.  If any breathing difficulty or chest pain develops go immediately to the closest emergency department for evaluation.

## 2021-09-12 NOTE — ED Triage Notes (Signed)
Pt here with severe headache, stuffy, and cough x 3 days.

## 2021-09-13 DIAGNOSIS — M0579 Rheumatoid arthritis with rheumatoid factor of multiple sites without organ or systems involvement: Secondary | ICD-10-CM | POA: Diagnosis not present

## 2021-09-13 DIAGNOSIS — M539 Dorsopathy, unspecified: Secondary | ICD-10-CM | POA: Diagnosis not present

## 2021-09-13 DIAGNOSIS — Z79899 Other long term (current) drug therapy: Secondary | ICD-10-CM | POA: Diagnosis not present

## 2021-09-13 LAB — COVID-19, FLU A+B NAA
Influenza A, NAA: NOT DETECTED
Influenza B, NAA: NOT DETECTED
SARS-CoV-2, NAA: NOT DETECTED

## 2021-09-16 ENCOUNTER — Ambulatory Visit: Payer: Self-pay

## 2021-09-16 NOTE — Telephone Encounter (Signed)
Pt. Reports she started having chest pain, shortness of breath and elevated heart rate 4 days ago. Pain comes and goes. Lasts a few minutes. Pain 10/10. Had pain at 0400 this morning . No pain now, but has shortness of breath. Instructed to go to ED now.Verbalizes understanding.    Reason for Disposition  [1] Chest pain (or "angina") comes and goes AND [2] is happening more often (increasing in frequency) or getting worse (increasing in severity) (Exception: chest pains that last only a few seconds)  Answer Assessment - Initial Assessment Questions 1. LOCATION: "Where does it hurt?"       Chest pain 2. RADIATION: "Does the pain go anywhere else?" (e.g., into neck, jaw, arms, back)     No 3. ONSET: "When did the chest pain begin?" (Minutes, hours or days)      4 days ago 4. PATTERN "Does the pain come and go, or has it been constant since it started?"  "Does it get worse with exertion?"      Comes and goes 5. DURATION: "How long does it last" (e.g., seconds, minutes, hours)     Minutes 6. SEVERITY: "How bad is the pain?"  (e.g., Scale 1-10; mild, moderate, or severe)    - MILD (1-3): doesn't interfere with normal activities     - MODERATE (4-7): interferes with normal activities or awakens from sleep    - SEVERE (8-10): excruciating pain, unable to do any normal activities       10 7. CARDIAC RISK FACTORS: "Do you have any history of heart problems or risk factors for heart disease?" (e.g., angina, prior heart attack; diabetes, high blood pressure, high cholesterol, smoker, or strong family history of heart disease)     High heart rate 8. PULMONARY RISK FACTORS: "Do you have any history of lung disease?"  (e.g., blood clots in lung, asthma, emphysema, birth control pills)     No 9. CAUSE: "What do you think is causing the chest pain?"     Unsure 10. OTHER SYMPTOMS: "Do you have any other symptoms?" (e.g., dizziness, nausea, vomiting, sweating, fever, difficulty breathing, cough)        Shortness of breath 11. PREGNANCY: "Is there any chance you are pregnant?" "When was your last menstrual period?"       No  Protocols used: Chest Pain-A-AH

## 2021-09-19 NOTE — Telephone Encounter (Signed)
Agree with ER plan of care, will follow-up with her after this.

## 2021-09-19 NOTE — Telephone Encounter (Signed)
FYI to provider

## 2021-09-21 ENCOUNTER — Encounter: Payer: Self-pay | Admitting: Cardiology

## 2021-09-21 ENCOUNTER — Ambulatory Visit: Payer: Medicare Other | Admitting: Cardiology

## 2021-09-21 NOTE — Progress Notes (Deleted)
Primary Care Provider: Venita Lick, NP [facility] Cardiologist: None Electrophysiologist: None Pulmonologist: Dr. Raul Del [facility])  Clinic Note: No chief complaint on file.   ===================================  ASSESSMENT/PLAN   Problem List Items Addressed This Visit   None   ===================================  HPI:    Catherine Giles is a 50 y.o. female with PMH notable for DM-2 (A1c 6.7 as of Dx in April 2022), HTN, HLD, COPD, and RHEUMATOID ARTHRITIS, and FIBROMYALGIA who is being seen today for the evaluation and treatment of HYPERTENSION, and ongoing Catherine Giles at the request of Venita Lick, NP.  Catherine Giles was seen by Marnee Guarneri, NP on 09/05/2021.  She noted her standard arthritis related arthralgias along with anxiety and progressive dyspnea. Increased amlodipine to 10 mg from 5 mg, but felt that she needed to be referred to cardiology for further assistance in a more complex case. Did not tolerate metformin because of GI issues, started on Farxiga. Thought to consider statin, but recent LDL was 78.  Recent Hospitalizations: ***  Reviewed  CV studies:    The following studies were reviewed today: (if available, images/films reviewed: From Epic Chart or Care Everywhere) CTA Chest-PE 04/29/2020: No acute PE.  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  (Consider lymphoma, thymoma, nodal metastasis or accessory thyroid).  No comment on coronary calcification.  TTE 04/30/2020: EF 60 to 65%.  Normal LV function.  No R WMA.  GR 1 DD.  Normal RV size and function.  Normal PAP.  Normal atrial size.  Normal valves.  Interval History:   Catherine Giles   CV Review of Symptoms (Summary) Cardiovascular ROS: {roscv:310661}  REVIEWED OF SYSTEMS   ROS  Chronic RA pain worse arms and hands as well as legs.  Usually 6/10 pain  I have reviewed and (if needed) personally updated the patient's problem list, medications,  allergies, past medical and surgical history, social and family history.   PAST MEDICAL HISTORY   Past Medical History:  Diagnosis Date   Arthritis    RA   Asthma    COPD (chronic obstructive pulmonary disease) ([place])    Depression    Diabetes mellitus without complication ([place])    Emphysema of lung ([place])    Family history of breast cancer    6/21 cancer genetic testing letter sent   Fibromyalgia    Hypertension    Lupus ([place])    Pre-diabetes    Thyroid disease     PAST SURGICAL HISTORY   Past Surgical History:  Procedure Laterality Date   MOUTH SURGERY      Immunization History  Administered Date(s) Administered   PFIZER(Purple Top)SARS-COV-2 Vaccination 04/21/2020, 05/21/2020   Td 03/24/2019    MEDICATIONS/ALLERGIES   No outpatient medications have been marked as taking for the 09/21/21 encounter (Appointment) with Leonie Man, MD.    Allergies  Allergen Reactions   Azithromycin Anaphylaxis   Penicillins     Did it involve swelling of the face/tongue/throat, SOB, or low BP? Yes Did it involve sudden or severe rash/hives, skin peeling, or any reaction on the inside of your mouth or nose? No Did you need to seek medical attention at a hospital or doctor's office? No When did it last happen?       If all above answers are "NO", may proceed with cephalosporin use.    Shellfish Allergy     SOCIAL HISTORY/FAMILY HISTORY   Reviewed in Epic:   Social History  Tobacco Use   Smoking status: Every Day    Packs/day: 1.00    Years: 34.00    Pack years: 34.00    Types: Cigarettes   Smokeless tobacco: Never   Tobacco comments:    2-3 cig/day  Vaping Use   Vaping Use: Never used  Substance Use Topics   Alcohol use: Yes    Comment: Occ   Drug use: Not Currently   Social History   Social History Narrative   ** Merged History Encounter **       Family History  Problem Relation Age of Onset   Lung cancer Mother    Diabetes Mother    Breast  cancer Mother 82   Hypertension Father    Cancer Maternal Aunt    Ovarian cancer Maternal Aunt    Diabetes Maternal Aunt    Diabetes Maternal Uncle    Hypertension Maternal Grandmother    Diabetes Maternal Grandmother     OBJCTIVE -PE, EKG, labs   Wt Readings from Last 3 Encounters:  09/05/21 173 lb 9.6 oz (78.7 kg)  08/16/21 171 lb 9.6 oz (77.8 kg)  07/25/21 169 lb 5 oz (76.8 kg)    Physical Exam: There were no vitals taken for this visit. Physical Exam   Adult ECG Report  Rate: *** ;  Rhythm: {rhythm:17366};   Narrative Interpretation: ***  Recent Labs:  ***  Lab Results  Component Value Date   CHOL 154 09/05/2021   HDL 72 09/05/2021   LDLCALC 47 09/05/2021   TRIG 226 (H) 09/05/2021   Lab Results  Component Value Date   CREATININE 0.62 09/05/2021   BUN 16 09/05/2021   NA 141 09/05/2021   K 3.8 09/05/2021   CL 103 09/05/2021   CO2 23 09/05/2021   CBC Latest Ref Rng & Units 09/05/2021 06/02/2021 01/24/2021  WBC 3.4 - 10.8 x10E3/uL 14.3(H) 8.6 10.7  Hemoglobin 11.1 - 15.9 g/dL 15.5 15.9 15.2  Hematocrit 34.0 - 46.6 % 46.6 48.6(H) 46.5  Platelets 150 - 450 x10E3/uL 341 348 429    Lab Results  Component Value Date   HGBA1C 5.7 (H) 09/05/2021   Lab Results  Component Value Date   TSH 2.730 09/05/2021    ==================================================  COVID-19 Education: The signs and symptoms of COVID-19 were discussed with the patient and how to seek care for testing (follow up with PCP or arrange E-visit).    I spent a total of *** minutes with the patient spent in direct patient consultation.  Additional time spent with chart review  / charting (studies, outside notes, etc): *** min Total Time: *** min  Current medicines are reviewed at length with the patient today.  (+/- concerns) ***  This visit occurred during the SARS-CoV-2 public health emergency.  Safety protocols were in place, including screening questions prior to the visit, additional  usage of staff PPE, and extensive cleaning of exam room while observing appropriate contact time as indicated for disinfecting solutions.  Notice: This dictation was prepared with Dragon dictation along with smart phrase technology. Any transcriptional errors that result from this process are unintentional and may not be corrected upon review.   Studies Ordered:  No orders of the defined types were placed in this encounter.   Patient Instructions / Medication Changes & Studies & Tests Ordered   There are no Patient Instructions on file for this visit.    Glenetta Hew, M.D., M.S. Interventional Cardiologist   Pager # [phone] Phone # [phone] [address]  [place], [place] [address]   Thank you for choosing [facility] in [place]!!

## 2021-09-22 ENCOUNTER — Encounter: Payer: Self-pay | Admitting: Cardiology

## 2021-09-28 ENCOUNTER — Ambulatory Visit: Payer: Self-pay

## 2021-09-28 ENCOUNTER — Telehealth: Payer: Medicare Other

## 2021-09-28 DIAGNOSIS — F320 Major depressive disorder, single episode, mild: Secondary | ICD-10-CM

## 2021-09-28 DIAGNOSIS — M797 Fibromyalgia: Secondary | ICD-10-CM

## 2021-09-28 DIAGNOSIS — M0579 Rheumatoid arthritis with rheumatoid factor of multiple sites without organ or systems involvement: Secondary | ICD-10-CM

## 2021-09-28 DIAGNOSIS — I152 Hypertension secondary to endocrine disorders: Secondary | ICD-10-CM

## 2021-09-28 DIAGNOSIS — E1159 Type 2 diabetes mellitus with other circulatory complications: Secondary | ICD-10-CM

## 2021-09-28 DIAGNOSIS — N951 Menopausal and female climacteric states: Secondary | ICD-10-CM

## 2021-09-28 NOTE — Patient Instructions (Signed)
Visit Information  Thank you for taking time to visit with me today. Please don't hesitate to contact me if I can be of assistance to you before our next scheduled telephone appointment.  Following are the goals we discussed today:  RNCM Clinical Goal(s):  Patient will verbalize basic understanding of HTN, Depression, and Chronic pain and acute onset of menopausal sx and sx disease process and self health management plan as evidenced by keeping appointments, following the plan of care, monitoring VS, calling the office for changes in condition, questions, or concerns, and working with the CCM team to optimize plan of care for chronic disease management and care coordination needs take all medications exactly as prescribed and will call provider for medication related questions as evidenced by taking as prescribed and calling the for refills before running out of medications     attend all scheduled medical appointments: 10-20-2021 at 0800, call gyn office and call and reschedule cardiologist appointment as evidenced by keeping appointments and calling the office for reschedule needs         demonstrate improved and ongoing health management independence as evidenced by stabilization of chronic conditions and pain level managed        continue to work with RN Care Manager and/or Social Worker to address care management and care coordination needs related to HTN, Depression, and chronic pain and new onset of sx and sx of menopause as evidenced by adherence to CM Team Scheduled appointments     demonstrate a decrease in chronic pain  exacerbations  as evidenced by reaching out to specialist as the patient states the infusion 2 weeks ago has not helped her at all demonstrate ongoing self health care management ability for effective management of chronic conditions  as evidenced by working with the CCM team through collaboration with Consulting civil engineer, provider, and care team.    Interventions: 1:1  collaboration with primary care provider regarding development and update of comprehensive plan of care as evidenced by provider attestation and co-signature Inter-disciplinary care team collaboration (see longitudinal plan of care) Evaluation of current treatment plan related to  self management and patient's adherence to plan as established by provider     Acute onset of menopausal sx and sx    (Status: New goal. Goal on track: NO.) Short Term Goal  Evaluation of current treatment plan related to  hot flashes and joint pain possibly related to menopause ,  self-management and patient's adherence to plan as established by provider. Discussed plans with patient for ongoing care management follow up and provided patient with direct contact information for care management team Advised patient to to call the gyn office and ask about recommendations as the sx and sx described to the [facility] are likely due to menopause. The patient has a gyn and verbalized understanding of calling the gyn office to ask for recommendations ; Provided education to patient re: sx and sx to monitor for in menopausal women. The patient is having hot flashes and states he arms are tingling and her pain is worse. Education provided on possible sx and sx of menopause. She states the hot flashes are making everything worse; Collaborated with pcp regarding patients states sx and sx and if there are any other recommendations to give to the patient other than reaching out to her gyn provider; Reviewed scheduled/upcoming provider appointments including 10-20-2021 at 0800 am; Discussed plans with patient for ongoing care management follow up and provided patient with direct contact information for care management  team;    Depression   (Status: Goal on track: NO.) Long Term Goal  Evaluation of current treatment plan related to Depression, Mental Health Concerns  self-management and patient's adherence to plan as established by provider.  09-28-2021: The patient was tearful today and in a lot of pain. Pain is the biggest reason for her depression. The patient had a good month last month and was even working a job but after receiving an infusion 2 weeks ago she states she is worse now that what she was before having the infusion. Advised the patient to reach out to the specialist and the patient is waiting to hear back from them as she has sent a message to the office. Empathetic listening and support given.  Discussed plans with patient for ongoing care management follow up and provided patient with direct contact information for care management team Advised patient to call the office for changes in mood, anxiety, or depression ; Provided education to patient re: doing things she enjoys doing, being around positive people, working with the CCM team and communicating needs to pcp and specialist ; Reviewed medications with patient and discussed compliance ; Reviewed scheduled/upcoming provider appointments including 10-20-2022 at 0800 am; Social Work referral for ongoing support and education for depression and chronic conditions impacting the patients health and well being ; Discussed plans with patient for ongoing care management follow up and provided patient with direct contact information for care management team; Advised patient to discuss mood changes and depression  with provider; Screening for signs and symptoms of depression related to chronic disease state;  Assessed social determinant of health barriers;    Hypertension: (Status: Goal on Track (progressing): YES.) Last practice recorded BP readings:     BP Readings from Last 3 Encounters:  09/12/21 (!) 143/85  09/05/21 (!) 148/92  08/16/21 (!) 146/92  Most recent eGFR/CrCl:       Lab Results  Component Value Date    EGFR 107 09/05/2021    No components found for: CRCL   Evaluation of current treatment plan related to hypertension self management and patient's  adherence to plan as established by provider;   Provided education to patient re: stroke prevention, s/s of heart attack and stroke; Reviewed prescribed diet heart healthy Reviewed medications with patient and discussed importance of compliance;  Discussed plans with patient for ongoing care management follow up and provided patient with direct contact information for care management team; Advised patient, providing education and rationale, to monitor blood pressure daily and record, calling PCP for findings outside established parameters. 09-28-2021: The patient states her blood pressures have been up and down at home but could not provide any readings to the [facility]. The patient states she was not having a good day.   Reviewed scheduled/upcoming provider appointments including: 09-28-2021: The patient states she missed her appointment with the cardiologist and has to call and reschedule. States her pain is "all over" not just in her chest. Advised patient to seek emergent care for unresolved chest pain, increased shortness of breath, or other life threatening sx and sx  Advised patient to discuss HTN and episodes of CP with provider; Provided education on prescribed diet heart healthy ;  Discussed complications of poorly controlled blood pressure such as heart disease, stroke, circulatory complications, vision complications, kidney impairment, sexual dysfunction;    Pain:  (Status: Goal on track: NO.) Long Term Goal  Pain assessment performed. 09-28-2021: The patient states her pain is a "10" today and it is  like she "has never had an infusion". She does not understand why the infusion she had 2 weeks ago has not helped her pain at all. She states she is in a "constant state of pain". She has a call into the RA specialist but has not heard back from the provider yet. The patient states that she does not know what is going on. Advised the patient to continue to take her medications as directed and follow up  with her specialist. Advised her to seek emergent help for unresolved pain. The patient verbalized understanding.  Medications reviewed Reviewed provider established plan for pain management; Discussed importance of adherence to all scheduled medical appointments; Counseled on the importance of reporting any/all new or changed pain symptoms or management strategies to pain management provider; Advised patient to report to care team affect of pain on daily activities; Discussed use of relaxation techniques and/or diversional activities to assist with pain reduction (distraction, imagery, relaxation, massage, acupressure, TENS, heat, and cold application; Reviewed with patient prescribed pharmacological and nonpharmacological pain relief strategies; Advised patient to discuss uncontrolled pain, changes in level or intensity of pain  with provider; Screening for signs and symptoms of depression related to chronic disease state;  Assessed social determinant of health barriers;    Patient Goals/Self-Care Activities: Take medications as prescribed   Attend all scheduled provider appointments Call pharmacy for medication refills 3-7 days in advance of running out of medications Attend church or other social activities Perform all self care activities independently  Perform IADL's (shopping, preparing meals, housekeeping, managing finances) independently Call provider office for new concerns or questions  Work with the social worker to address care coordination needs and will continue to work with the clinical team to address health care and disease management related needs call the Suicide and Crisis Lifeline: 988 call the [place] National Suicide Prevention Lifeline: 716-138-0365 or TTY: 3150047605 TTY (931)451-5941) to talk to a trained counselor call 1-800-273-TALK (toll free, 24 hour hotline) if experiencing a Mental Health or [place]  check blood pressure daily choose a place  to take my blood pressure (home, clinic or office, retail store) write blood pressure results in a log or diary learn about high blood pressure keep a blood pressure log take blood pressure log to all doctor appointments call doctor for signs and symptoms of high blood pressure develop an action plan for high blood pressure keep all doctor appointments take medications for blood pressure exactly as prescribed report new symptoms to your doctor eat more whole grains, fruits and vegetables, lean meats and healthy fats    Our next appointment is by telephone on 12-14-2021 at 345 pm  Please call the care guide team at [phone] if you need to cancel or reschedule your appointment.   If you are experiencing a Mental Health or [place] or need someone to talk to, please call the Suicide and Crisis Lifeline: 988 call the [place] National Suicide Prevention Lifeline: (587)813-9176 or TTY: 762-742-3034 TTY 214-816-5911) to talk to a trained counselor call 1-800-273-TALK (toll free, 24 hour hotline)   Patient verbalizes understanding of instructions provided today and agrees to view in [place].   Noreene Larsson RN, MSN, [facility] Mobile: [phone]

## 2021-09-28 NOTE — Chronic Care Management (AMB) (Signed)
Chronic Care Management   CCM RN Visit Note  09/28/2021 Name: Catherine Giles MRN: [id number] DOB: 09-18-1971  Subjective: Catherine Giles is a 50 y.o. year old female who is a primary care patient of Cannady, Barbaraann Faster, NP. The care management team was consulted for assistance with disease management and care coordination needs.    Engaged with patient by telephone for follow up visit in response to provider referral for case management and/or care coordination services.   Consent to Services:  The patient was given information about Chronic Care Management services, agreed to services, and gave verbal consent prior to initiation of services.  Please see initial visit note for detailed documentation.   Patient agreed to services and verbal consent obtained.   Assessment: Review of patient past medical history, allergies, medications, health status, including review of consultants reports, laboratory and other test data, was performed as part of comprehensive evaluation and provision of chronic care management services.   SDOH (Social Determinants of Health) assessments and interventions performed:    CCM Care Plan  Allergies  Allergen Reactions   Azithromycin Anaphylaxis   Penicillins     Did it involve swelling of the face/tongue/throat, SOB, or low BP? Yes Did it involve sudden or severe rash/hives, skin peeling, or any reaction on the inside of your mouth or nose? No Did you need to seek medical attention at a hospital or doctor's office? No When did it last happen?       If all above answers are "NO", may proceed with cephalosporin use.    Shellfish Allergy     Outpatient Encounter Medications as of 09/28/2021  Medication Sig Note   albuterol (PROVENTIL) (2.5 MG/3ML) 0.083% nebulizer solution Take 3 mLs (2.5 mg total) by nebulization every 6 (six) hours as needed for wheezing or shortness of breath.    albuterol (VENTOLIN HFA) 108 (90 Base) MCG/ACT inhaler Inhale 2 puffs into the  lungs every 4 (four) hours as needed.     amLODipine (NORVASC) 10 MG tablet Take 1 tablet (10 mg total) by mouth daily.    Blood Glucose Monitoring Suppl (ONETOUCH VERIO) w/Device KIT Use to check blood sugar 3 times daily with goal <130 fasting and <180 two hours after meal.  Bring meter to visits.    busPIRone (BUSPAR) 5 MG tablet Take 1 tablet (5 mg total) by mouth 2 (two) times daily.    cyproheptadine (PERIACTIN) 4 MG tablet Take 1 tablet (4 mg total) by mouth at bedtime.    dapagliflozin propanediol (FARXIGA) 10 MG TABS tablet Take 1 tablet (10 mg total) by mouth daily before breakfast.    doxycycline (VIBRA-TABS) 100 MG tablet Take 1 tablet (100 mg total) by mouth 2 (two) times daily.    DULoxetine (CYMBALTA) 30 MG capsule Start out with 30 mg (one tablet) once daily by mouth for 1 week, then increase to 60 mg (two tablets) once daily by mouth. 05/27/2021: Patient states she has stopped taking, broke her out and she stopped taking, started taking again and made her very sick x 2 days. Is not longer taking.    fexofenadine (ALLEGRA ALLERGY) 180 MG tablet Take 1 tablet (180 mg total) by mouth daily.    fluticasone (FLONASE) 50 MCG/ACT nasal spray Place 2 sprays into both nostrils daily.    Fluticasone-Umeclidin-Vilant (TRELEGY ELLIPTA) 100-62.5-25 MCG/INH AEPB Inhale 1 puff into the lungs daily.    glucose blood test strip Use to check blood sugar 3 times daily with goal <130 fasting and <  180 two hours after meal.    golimumab (SIMPONI ARIA) 50 MG/4ML SOLN injection Inject into the vein.    ibuprofen (ADVIL) 400 MG tablet Take 1 tablet (400 mg total) by mouth every 8 (eight) hours as needed.    ipratropium-albuterol (DUONEB) 0.5-2.5 (3) MG/3ML SOLN Take 3 mLs by nebulization every 6 (six) hours as needed for up to 7 days.    Lactobacillus PACK Take 1 each by mouth as needed (for diarrhea.).    Lancets (ONETOUCH ULTRASOFT) lancets Use to check blood sugar 3 times daily with goal <130 fasting and  <180 two hours after meal.    leflunomide (ARAVA) 20 MG tablet Take 20 mg by mouth daily.    levothyroxine (SYNTHROID) 50 MCG tablet Take 50 mcg by mouth daily before breakfast.     liothyronine (CYTOMEL) 5 MCG tablet Take 5 mcg by mouth daily.    losartan (COZAAR) 100 MG tablet Take 1 tablet (100 mg total) by mouth daily.    metFORMIN (GLUCOPHAGE) 500 MG tablet Take 1 tablet (500 mg total) by mouth 2 (two) times daily with a meal.    montelukast (SINGULAIR) 10 MG tablet Take 10 mg by mouth at bedtime.    nicotine (NICOTINE STEP 1) 21 mg/24hr patch Place 1 patch (21 mg total) onto the skin daily.    ondansetron (ZOFRAN ODT) 4 MG disintegrating tablet Take 1 tablet (4 mg total) by mouth every 8 (eight) hours as needed for nausea or vomiting.    oseltamivir (TAMIFLU) 75 MG capsule Take 1 capsule (75 mg total) by mouth 2 (two) times daily.    promethazine-dextromethorphan (PROMETHAZINE-DM) 6.25-15 MG/5ML syrup Take 5 mLs by mouth 4 (four) times daily as needed for cough.    tiZANidine (ZANAFLEX) 4 MG tablet Take 1 tablet (4 mg total) by mouth every 8 (eight) hours as needed for muscle spasms.    triamcinolone cream (KENALOG) 0.1 % Apply 1 application topically 2 (two) times daily.    [DISCONTINUED] cetirizine (ZYRTEC) 10 MG tablet Take 1 tablet (10 mg total) by mouth daily.    [DISCONTINUED] traZODone (DESYREL) 50 MG tablet Take 1 tablet (50 mg total) by mouth at bedtime.    No facility-administered encounter medications on file as of 09/28/2021.    Patient Active Problem List   Diagnosis Date Noted   Neck pain 08/16/2021   Depression, major, single episode, mild ([place]) 01/24/2021   Memory changes 10/12/2020   Dairy allergy 10/12/2020   Headache 10/12/2020   Vaginal stenosis 03/19/2020   Obesity 03/19/2020   Insomnia 01/05/2020   Complex regional pain syndrome I of upper limb 12/17/2019   Hypertension associated with diabetes ([place]) 12/13/2019   Centrilobular emphysema ([place]) 12/13/2019    Type 2 diabetes mellitus with obesity ([place]) 12/13/2019   Hashimoto's thyroiditis 12/13/2019   DDD (degenerative disc disease), lumbar 08/26/2019   Multinodular goiter 08/14/2019   Chronic left hip pain 05/05/2019   Osteoarthritis of spine with radiculopathy, cervical region 05/05/2019   Fibromyalgia 02/10/2019   Rheumatoid arthritis involving multiple sites with positive rheumatoid factor ([place]) 02/10/2019   Cervical radiculopathy 03/13/2018   Vitamin D deficiency 10/11/2015    Conditions to be addressed/monitored:HTN, Depression, and Chronic pain and acute onset of menopausal sx and sx  Care Plan : RNCM: Depression (Adult)  Updates made by Vanita Ingles, RN since 09/28/2021 12:00 AM  Completed 09/28/2021   Problem: RNCM: Harm or Injury (Depression) Resolved 09/28/2021  Priority: High     Long-Range Goal: RNCM: Harm or  Injury Prevented- depression Completed 09/28/2021  Start Date: 03/08/2021  Expected End Date: 03/08/2022  Recent Progress: On track  Priority: High  Note:   Current Barriers: Resolving, duplicate goal  Knowledge Deficits related to resources for effective management of depression, depression worse due to severe pain and unable to take medication that was prescribed for depression Care Coordination needs related to effective management of depression exacerbation  in a patient with severe pain causing depression symptoms to be exacerbated Chronic Disease Management support and education needs related to depression with a patient with multiple chronic conditions including sever pain Lacks caregiver support.  Non-adherence to scheduled provider appointments Non-adherence to prescribed medication regimen Unable to independently manage depression due to severe pain and unable to take Cymbalta due to side effect of rash. Stopped taking the medication but did not notify the provider.  Unable to self administer medications as prescribed Does not attend all scheduled provider  appointments Does not adhere to prescribed medication regimen Lacks social connections Does not contact provider office for questions/concerns  Nurse Case Manager Clinical Goal(s):  patient will verbalize understanding of plan for effective management of depression  patient will work with [place], CCM team and pcp to address needs related to effective management of depression patient will demonstrate a decrease in depression  exacerbations as evidenced by finding medication to help with depression, working with CCM team to optimize mental health and well being.  patient will attend all scheduled medical appointments: 05-11-2021 at [place] am, 05-27-2021: Missed several appointments including x 2 to the pcp office. Agrees today to talk to the office to secure a new appointment with pcp. 07-20-2021: Ask for an appointment with the pcp so she can come in and discuss her health, well being, and medications. In basket message sent to staff asking for them to reach out to her and schedule an appointment.  patient will experience decrease in ED visits. ED visits in last 6 months = 6, 1 actuall ED, 5 urgent care patient will work with CM team pharmacist to assist with medication side effects, education, and reconciliation  patient will work with CM clinical social worker to worsening depression due to uncontrolled pain and discomfort. 04-08-2021: Currently working with the LCSW for ongoing support and assistance. 07-20-2021: The patient has declined to reschedule with the LCSW at this time. Did discuss with her the benefits of the CCM team. The patient states she will continue to work with the [facility] and will consider working with the LCSW.   Interventions:  1:1 collaboration with Venita Lick, NP regarding development and update of comprehensive plan of care as evidenced by provider attestation and co-signature Inter-disciplinary care team collaboration (see longitudinal plan of care) Evaluation of current  treatment plan related to depression  and patient's adherence to plan as established by provider. The patient is currently having an exacerbation of her depression and was very tearful. Empathetic listening and support given. Encouraged the patient to reach out for help if she felt hopeless or had thoughts of hurting self. 04-08-2021: The patient is feeling better today but still having issues with pain and depression. She just wants to have balance in her life. She states she talked to someone about her appointment with pain clinic but does not remember if an appointment was made. Gave her the information to call the pain clinic. The patient verbalized understanding. Empathetic listening and support given.  05-27-2021: The patient is very frustrated with herself and her healthy conditions. She has missed multiple  appointments due to her memory and not remember what she needs to do and when she needs to go. She wants to feel better. Her husband has changed his schedule so he can help her more with the management of her conditions. She says her family is trying to help her get on track with her appointments and managing her health. The patient was not tearful today. Expressed thankfulness for RNCM call. Will work with the office to get a new appointment for follow up with the pcp. 07-20-2021: The patient states that she knows she has not been compliant with appointments and recommendations of the providers. She is taking the cymbalta now and denies issues with the cymbalta. States she lives in a "constant state of pain". She is much clearer today in her mentation and states her husband changing his schedule has been helpful for her. She is going through "menopause" and this is making things worse. She is having hot flashes and other symptoms. She ask if the pcp could refer her to a different OB/GYN or help her with medications to help the symptoms. She also needs a referral for mammogram.  Will collaborate with the pcp  on multiple request the patient expressed today. Did ask the patient to write down questions to ask the pcp at her appointment. Also reminded the patient it was essential to keep her appointment with the pcp.  Advised patient to call the office for changes or questions, to be available when the office calls to make appointment with pcp (appointment secured for 03-15-2021 at 0940 am) to seek emergent help for suicidal ideation or thoughts of hurting self or others. 05-27-2021: The patient remains non-compliant with appointments. Has missed several. She denies SI and the patient states that she is trying to do better. Her family is on board with helping her with her appointments. 07-20-2021: The patient admits to non-compliance in the past and wants to get her healht and well being under control. She states she has been through so much. Education and support given.  Provided education to patient re: the need to find a medication that would work well for her depression that does not cause side effects. The patient stopped taking the Cymbalta due to a rash. She says her depression is worse due to the severe pain and discomfort she is experiencing. 04-08-2021: The patient is not tearful today. States compliance with medications and they are "trying" to help but her pain level is so bad. She is hopeful the pain clinic can assist her and help her with management of her RA pain. She feels this will help improve her depressive state. Denies any SI at this time. Will continue to monitor. 05-27-2021: The patient is not taking the Cymbalta. She tried it but it broke her out, she stopped it and then started it again and it made her "very sick" x 2 days so she is not taking it again. Talked to her about the importance of keeping her appointments that the providers can not help her if she does not follow up with the providers. The patient verbalized understanding. Will collaborate with the whole CCM team and pcp for assistance with the  patient in meeting her needs. 07-20-2021: She wants to come in and see the pcp and discuss all of her medications with the pcp. She wants to make sure she understands what they are for and why she is taking. Education and support given.  Reviewed medications with patient and discussed compliance. The patient is  not taking any medications for depression at this time. 04-08-2021: States compliance with the medications at this time. 05-27-2021: Is currently not taking the cymbalta, is taking Lyrica but states it is not effective in helping her pain level. She says her stomach is "so tore up", will consult with pharm D for assistance with medications management. 9-28-2022Ladon Applebaum that she is taking Cymbalta now and is not having any further issues with Cymbalta.  Collaborated with pcp, CCM team  regarding exacerbation of depression and the need to see pcp for evaluation and treatment. 05-27-2021: Will collaborate with the pcp and CCM team to get needed help to meet the patients needs.  Reviewed scheduled/upcoming provider appointments including: 05-11-2021 at [place] am. 05-27-2021: The patient states she has missed several appointments and knows she has not followed up appropriate. Agrees to get a new appointment with the pcp and have the admin staff call for a new appointment with the pcp. 07-20-2021: The patient agrees for the office to call and get an appointment with the pcp scheduled Social Work referral for exacerbation of depression. 04-08-2021: The patient is actively working with the LCSW at this time for ongoing support and recommendations. 07-20-2021: The patient declined scheduling with the LCSW. Will continue to monitor for needs and discuss re-engagement.  Pharmacy referral for medication side effects and medication reconciliation. Discussed plans with patient for ongoing care management follow up and provided patient with direct contact information for care management team  Patient Goals/Self-Care  Activities patient will:  - Patient will self administer medications as prescribed Patient will attend all scheduled provider appointments Patient will call pharmacy for medication refills Patient will attend church or other social activities Patient will continue to perform ADL's independently Patient will continue to perform IADL's independently Patient will call provider office for new concerns or questions Patient will work with BSW to address care coordination needs and will continue to work with the clinical team to address health care and disease management related needs.   Seek emergent care for suicidal ideation or thoughts of harming self or others - action plan for worsening symptoms mutually developed - barriers to safety identified and addressed - emergency contact information provided - family/caregiver monitoring of patient's emotional state encouraged - modification of home and work environment promoted - personal safety plan developed - provision of safe home environment by family/caregiver encouraged - risk of self-injury assessed - side effects of medication monitored - suicidal or self-injurious tendencies explored  Follow Up Plan: Telephone follow up appointment with care management team member scheduled for:  09-28-2021 at 345 pm       Care Plan : RNCM: Chronic Pain (Adult)  Updates made by Vanita Ingles, RN since 09/28/2021 12:00 AM  Completed 09/28/2021   Problem: RNCM: Pain Management Plan (Chronic Pain) left chest pain, RA, Fibromyalgia Resolved 09/28/2021  Priority: High     Long-Range Goal: RNCM: Pain Management Plan Developed Completed 09/28/2021  Start Date: 03/08/2021  Expected End Date: 03/08/2022  Recent Progress: Not on track  Priority: High  Note:   Current Barriers: resolving, duplicate goal  Knowledge Deficits related to managing acute/chronic pain Non-adherence to scheduled provider appointments Non-adherence to prescribed medication  regimen Difficulty obtaining medications Chronic Disease Management support and education needs related to chronic pain Unable to independently manage pain and discomfort as evidence of pain level at >10 today Unable to self administer medications as prescribed Does not attend all scheduled provider appointments Does not adhere to prescribed medication regimen Lacks  social connections Does not contact provider office for questions/concerns Nurse Case Manager Clinical Goal(s):  patient will verbalize understanding of plan for managing pain patient will talk with RN Care Manager to address new concerns of pain and discomfort and help with exacerbation of pain and discomfort  patient will attend all scheduled medical appointments: 05-11-2021 at 0940 am- needs a new appointment. Staff to call for an appointment 07-20-2021: The patient agrees to an appointment with the pcp and the office calling to set up and appointment with the patient for pcp visit.  patient will demonstrate use of different relaxation  skills and/or diversional activities to assist with pain reduction (distraction, imagery, relaxation, massage, acupressure, TENS, heat, and cold application patient will report pain at a level less than 3 to 4 on a 10-10 rating scale patient will use pharmacological and nonpharmacological pain relief strategies patient will verbalize acceptable level of pain relief and ability to engage in desired activities patient will engage in desired activities without an increase in pain level Interventions:  Collaboration with Marnee Guarneri T, NP regarding development and update of comprehensive plan of care as evidenced by provider attestation and co-signature Inter-disciplinary care team collaboration (see longitudinal plan of care) - deep breathing, relaxation and mindfulness use promoted - effectiveness of pharmacologic therapy monitored - medication-induced side effects managed - misuse of pain  medication assessed - motivation and barriers to change assessed and addressed - mutually acceptable comfort goal set - pain assessed- 05-27-2021: The patient rates her pain as "uncontrolled", she has been taking Lyrica but does not see where it is helping. Had an infusion and it did not help but is having another one. The patient has missed several appointments and knows she has to follow up to get help. 07-20-2021: The patient had an infusion today. She states that she cannot say yet that it is helping. Due to a sick child and other circumstances she had to put her needs to the side and take care of those things and she got off schedule with her infusions. She will have another infusion in 8 weeks. She states she is wearing a wrist brace for her left wrist carpal tunnel and she will go back for another appointment to see the specialist. They have discussed surgery but she is unsure that is the answer as everyone is addressing her Lupus and RA, not her fibromyalgia.  The patient says the wrist brace is helping "a little". She states the house calls visit NP was there a couple of weeks ago and states that she was concerned with her taking Leflumonide and was sending a message to the pcp. The patient states she told her it put her at increased risk of heart attack. Will send and in basket message to the pcp but also advised the patient that she likely would need to talk to her Rheumatologist as this was a medications for her RA. Patient verbalized understanding. She states that she lives in a "constant state of pain" and she has good days and bad days and she does the best that she can. She wants to be clear on what she is taking and why she is taking it.  - pain treatment goals reviewed 07-20-2021: The patient wants to get her pain under control  - premedication prior to activity encouraged Evaluation of current treatment plan related to pain: RA, fibromyalgia, and left chest pain (patient seen in ER and cardiac  issues ruled out) and patient's adherence to plan as established by provider.  04-08-2021: The patient is still having issues with pain. She is concerned that the medications are causing diarrhea. She does not know what is going on. She states she is taking Farxiga 10 mg, and the duloxetine and gabapentin, along with tramadol. She states that she can tell it is "trying" to work. When inquiring about the pain clinic the patient states she talked to someone but her memory is foggy and she cannot remember is an appointment was made. Review of upcoming appointments and no appointments noted. Did give the number to Dr. Holley Raring office at [phone] and ask the patient to call and get an appointment with Dr. Holley Raring. The patient was going to call after the call with the [facility]. 05-27-2021: The patient states that she has missed several appointments and it is her fault.  She states she cannot remember things and she is overwhelmed with her health conditions. Suggested writing down her appointments or putting reminders on her phone. She states her family is trying to help her and her husband has even changed his work schedule so that he can help her. She wants to feel better. Discussed options and recommended that she call the specialist office and get numbers to call for the referrals to orthopedic providers and other referrals they have made and she has not gone to. 07-20-2021: The patient sees several providers for her chronic conditions and pain management. The patient states she has gotten off track with medications but is wanting to change things for the better and feel better. Review of upcoming specialist appointments. Advised the patient to write down questions to ask the providers so she would not forget what she wanted to ask. Advised patient to call the office for worsening level or intensity of pain and to seek emergent care for unresolved pain and discomfort  Provided education to patient re: safety concerns,  working with the CCM team and pcp, alternative pain relief methods.  Reviewed medications with patient and discussed compliance, the patient is supposed to start infusions soon for her RA. She saw specialist last week and she is waiting on an appointment for infusion. 05-27-2021: The patient had the infusion but can not see where it helped. She is scheduled for a new infusion and is mindful of this appointment. The Lyrica she is taking but states she can not see where it has helped. Reflective listening and support given. 07-20-2021: Had an infusion today. States she cannot tell if it is helping. She will have another infusion in 8 weeks.  Collaborated with pcp, CCM team regarding Chronic pain and discomfort  Discussed plans with patient for ongoing care management follow up and provided patient with direct contact information for care management team Allow patient to maintain a diary of pain ratings, timing, precipitating events, medications, treatments, and what works best to relieve pain,  Refer to support groups and self-help groups Educate patient about the use of pharmacological interventions for pain management- antianxiety, antidepressants, NSAIDS, opioid analgesics,  Explain the importance of lifestyle modifications to effective pain management  Patient Goals/Self Care Activities:  - mutually acceptable comfort goal set - pain assessed - pain management plan developed - pain treatment goals reviewed - patient response to treatment assessed - sharing of pain management plan with teachers and other caregivers encouraged Self-administers medications as prescribed Attends all scheduled provider appointments Calls pharmacy for medication refills Calls provider office for new concerns or questions Follow Up Plan: Telephone follow up appointment with care management team member scheduled for: 09-28-2021 at  345 pm     Care Plan : RNCM: Hypertension (Adult)  Updates made by Vanita Ingles, RN  since 09/28/2021 12:00 AM  Completed 09/28/2021   Problem: RNCM: Hypertension (Hypertension) Resolved 09/28/2021  Priority: Medium     Long-Range Goal: RNCM: Hypertension Monitored Completed 09/28/2021  Start Date: 03/08/2021  Expected End Date: 05/22/2022  Recent Progress: On track  Priority: Medium  Note:   Objective: Resolving, duplicate goal  Last practice recorded BP readings:  BP Readings from Last 3 Encounters:  06/02/21 (!) 137/99  03/16/21 138/85  03/06/21 (!) 171/98     Most recent eGFR/CrCl:  Lab Results  Component Value Date   EGFR 106 01/24/2021    No components found for: CRCL Current Barriers:  Knowledge Deficits related to basic understanding of hypertension pathophysiology and self care management Knowledge Deficits related to understanding of medications prescribed for management of hypertension Non-adherence to prescribed medication regimen Non-adherence to scheduled provider appointments Limited Social Support Unable to independently manage HTN Does not adhere to prescribed medication regimen Lacks social connections Does not contact provider office for questions/concerns Case Manager Clinical Goal(s):  patient will verbalize understanding of plan for hypertension management patient will attend all scheduled medical appointments: 05-11-2021- missed appointments x 2, needs new appointment, staff message sent. 07-20-2021: Agrees to let the office call and schedule her an appointment with pcp  patient will demonstrate improved adherence to prescribed treatment plan for hypertension as evidenced by taking all medications as prescribed, monitoring and recording blood pressure as directed, adhering to low sodium/DASH diet patient will demonstrate improved health management independence as evidenced by checking blood pressure as directed and notifying PCP if SBP>150 or DBP > 60, taking all medications as prescribe, and adhering to a low sodium diet as discussed. patient  will verbalize basic understanding of hypertension disease process and self health management plan as evidenced by compliance with heart healthy diet, compliance with medications and working with the CCM team to manage health and well being.  Interventions:  Collaboration with Venita Lick, NP regarding development and update of comprehensive plan of care as evidenced by provider attestation and co-signature Inter-disciplinary care team collaboration (see longitudinal plan of care) UNABLE to independently:manage HTN Evaluation of current treatment plan related to hypertension self management and patient's adherence to plan as established by provider. 05-27-2021: The patient states her blood pressure is up when she is in pain. The patient states that she wants to have a balance in her health. Empathetic listening and support given. Will continue to monitor. 07-20-2021: The patient had not been taking her medications for HTN but states she is taking her medications now. Denies any issues with medications. States she is taking her blood pressure at home and it has normalized. Education and support.  Provided education to patient re: stroke prevention, s/s of heart attack and stroke, DASH diet, complications of uncontrolled blood pressure Reviewed medications with patient and discussed importance of compliance. 07-20-2021: States she is compliant with medications at this time.  Discussed plans with patient for ongoing care management follow up and provided patient with direct contact information for care management team Advised patient, providing education and rationale, to monitor blood pressure daily and record, calling PCP for findings outside established parameters.  Reviewed scheduled/upcoming provider appointments including: office to call and schedule a follow up with the pcp  Self-Care Activities: - Self administers medications as prescribed Attends all scheduled provider appointments Calls  provider office for new concerns, questions, or BP outside  discussed parameters Checks BP and records as discussed Follows a low sodium diet/DASH diet Patient Goals: - check blood pressure daily - choose a place to take my blood pressure (home, clinic or office, retail store) - write blood pressure results in a log or diary - agree on reward when goals are met - agree to work together to make changes - ask questions to understand - have a family meeting to talk about healthy habits - learn about high blood pressure - blood pressure trends reviewed - depression screen reviewed - home or ambulatory blood pressure monitoring encouraged Follow Up Plan: Telephone follow up appointment with care management team member scheduled for: 09-28-2021 at 345 pm    Care Plan : RNCM: General Plan of Care (Adult) for Chronic Disease Management and Care Coordination Needs  Updates made by Vanita Ingles, RN since 09/28/2021 12:00 AM     Problem: RNCM: Development of Plan of Care for Chronic Disease Management (HTN, Depression, Chronic pain)   Priority: High     Long-Range Goal: RNCM: Effective Management  of Plan of Care for Chronic Disease Management (HTN, Depression, Chronic pain)   Start Date: 09/28/2021  Expected End Date: 09/28/2022  Priority: High  Note:   Current Barriers:  Knowledge Deficits related to plan of care for management of HTN, Depression: depressed mood anxiety hopelessness disturbed sleep impaired memory, and chronic pain  Chronic Disease Management support and education needs related to HTN, Depression: depressed mood anxiety hopelessness disturbed sleep impaired memory, and chronic pain Memory concerns  RNCM Clinical Goal(s):  Patient will verbalize basic understanding of HTN, Depression, and Chronic pain and acute onset of menopausal sx and sx disease process and self health management plan as evidenced by keeping appointments, following the plan of care, monitoring VS,  calling the office for changes in condition, questions, or concerns, and working with the CCM team to optimize plan of care for chronic disease management and care coordination needs take all medications exactly as prescribed and will call provider for medication related questions as evidenced by taking as prescribed and calling the for refills before running out of medications     attend all scheduled medical appointments: 10-20-2021 at 0800, call gyn office and call and reschedule cardiologist appointment as evidenced by keeping appointments and calling the office for reschedule needs         demonstrate improved and ongoing health management independence as evidenced by stabilization of chronic conditions and pain level managed        continue to work with RN Care Manager and/or Social Worker to address care management and care coordination needs related to HTN, Depression, and chronic pain and new onset of sx and sx of menopause as evidenced by adherence to CM Team Scheduled appointments     demonstrate a decrease in chronic pain  exacerbations  as evidenced by reaching out to specialist as the patient states the infusion 2 weeks ago has not helped her at all demonstrate ongoing self health care management ability for effective management of chronic conditions  as evidenced by working with the CCM team through collaboration with Consulting civil engineer, provider, and care team.   Interventions: 1:1 collaboration with primary care provider regarding development and update of comprehensive plan of care as evidenced by provider attestation and co-signature Inter-disciplinary care team collaboration (see longitudinal plan of care) Evaluation of current treatment plan related to  self management and patient's adherence to plan as established by provider   Acute onset of  menopausal sx and sx    (Status: New goal. Goal on track: NO.) Short Term Goal  Evaluation of current treatment plan related to  hot flashes  and joint pain possibly related to menopause ,  self-management and patient's adherence to plan as established by provider. Discussed plans with patient for ongoing care management follow up and provided patient with direct contact information for care management team Advised patient to to call the gyn office and ask about recommendations as the sx and sx described to the [facility] are likely due to menopause. The patient has a gyn and verbalized understanding of calling the gyn office to ask for recommendations ; Provided education to patient re: sx and sx to monitor for in menopausal women. The patient is having hot flashes and states he arms are tingling and her pain is worse. Education provided on possible sx and sx of menopause. She states the hot flashes are making everything worse; Collaborated with pcp regarding patients states sx and sx and if there are any other recommendations to give to the patient other than reaching out to her gyn provider; Reviewed scheduled/upcoming provider appointments including 10-20-2021 at 0800 am; Discussed plans with patient for ongoing care management follow up and provided patient with direct contact information for care management team;   Depression   (Status: Goal on track: NO.) Long Term Goal  Evaluation of current treatment plan related to Depression, Mental Health Concerns  self-management and patient's adherence to plan as established by provider. 09-28-2021: The patient was tearful today and in a lot of pain. Pain is the biggest reason for her depression. The patient had a good month last month and was even working a job but after receiving an infusion 2 weeks ago she states she is worse now that what she was before having the infusion. Advised the patient to reach out to the specialist and the patient is waiting to hear back from them as she has sent a message to the office. Empathetic listening and support given.  Discussed plans with patient for ongoing care  management follow up and provided patient with direct contact information for care management team Advised patient to call the office for changes in mood, anxiety, or depression ; Provided education to patient re: doing things she enjoys doing, being around positive people, working with the CCM team and communicating needs to pcp and specialist ; Reviewed medications with patient and discussed compliance ; Reviewed scheduled/upcoming provider appointments including 10-20-2022 at 0800 am; Social Work referral for ongoing support and education for depression and chronic conditions impacting the patients health and well being ; Discussed plans with patient for ongoing care management follow up and provided patient with direct contact information for care management team; Advised patient to discuss mood changes and depression  with provider; Screening for signs and symptoms of depression related to chronic disease state;  Assessed social determinant of health barriers;   Hypertension: (Status: Goal on Track (progressing): YES.) Last practice recorded BP readings:  BP Readings from Last 3 Encounters:  09/12/21 (!) 143/85  09/05/21 (!) 148/92  08/16/21 (!) 146/92  Most recent eGFR/CrCl:  Lab Results  Component Value Date   EGFR 107 09/05/2021    No components found for: CRCL  Evaluation of current treatment plan related to hypertension self management and patient's adherence to plan as established by provider;   Provided education to patient re: stroke prevention, s/s of heart attack and stroke; Reviewed prescribed diet heart healthy Reviewed medications with patient  and discussed importance of compliance;  Discussed plans with patient for ongoing care management follow up and provided patient with direct contact information for care management team; Advised patient, providing education and rationale, to monitor blood pressure daily and record, calling PCP for findings outside established  parameters. 09-28-2021: The patient states her blood pressures have been up and down at home but could not provide any readings to the [facility]. The patient states she was not having a good day.   Reviewed scheduled/upcoming provider appointments including: 09-28-2021: The patient states she missed her appointment with the cardiologist and has to call and reschedule. States her pain is "all over" not just in her chest. Advised patient to seek emergent care for unresolved chest pain, increased shortness of breath, or other life threatening sx and sx  Advised patient to discuss HTN and episodes of CP with provider; Provided education on prescribed diet heart healthy ;  Discussed complications of poorly controlled blood pressure such as heart disease, stroke, circulatory complications, vision complications, kidney impairment, sexual dysfunction;   Pain:  (Status: Goal on track: NO.) Long Term Goal  Pain assessment performed. 09-28-2021: The patient states her pain is a "10" today and it is like she "has never had an infusion". She does not understand why the infusion she had 2 weeks ago has not helped her pain at all. She states she is in a "constant state of pain". She has a call into the RA specialist but has not heard back from the provider yet. The patient states that she does not know what is going on. Advised the patient to continue to take her medications as directed and follow up with her specialist. Advised her to seek emergent help for unresolved pain. The patient verbalized understanding.  Medications reviewed Reviewed provider established plan for pain management; Discussed importance of adherence to all scheduled medical appointments; Counseled on the importance of reporting any/all new or changed pain symptoms or management strategies to pain management provider; Advised patient to report to care team affect of pain on daily activities; Discussed use of relaxation techniques and/or diversional  activities to assist with pain reduction (distraction, imagery, relaxation, massage, acupressure, TENS, heat, and cold application; Reviewed with patient prescribed pharmacological and nonpharmacological pain relief strategies; Advised patient to discuss uncontrolled pain, changes in level or intensity of pain  with provider; Screening for signs and symptoms of depression related to chronic disease state;  Assessed social determinant of health barriers;   Patient Goals/Self-Care Activities: Take medications as prescribed   Attend all scheduled provider appointments Call pharmacy for medication refills 3-7 days in advance of running out of medications Attend church or other social activities Perform all self care activities independently  Perform IADL's (shopping, preparing meals, housekeeping, managing finances) independently Call provider office for new concerns or questions  Work with the social worker to address care coordination needs and will continue to work with the clinical team to address health care and disease management related needs call the Suicide and Crisis Lifeline: 988 call the [place] National Suicide Prevention Lifeline: (332)798-6010 or TTY: 819-885-8983 TTY 220-391-2182) to talk to a trained counselor call 1-800-273-TALK (toll free, 24 hour hotline) if experiencing a Mental Health or [place]  check blood pressure daily choose a place to take my blood pressure (home, clinic or office, retail store) write blood pressure results in a log or diary learn about high blood pressure keep a blood pressure log take blood pressure log to all doctor appointments call  doctor for signs and symptoms of high blood pressure develop an action plan for high blood pressure keep all doctor appointments take medications for blood pressure exactly as prescribed report new symptoms to your doctor eat more whole grains, fruits and vegetables, lean meats and healthy fats        Plan:Telephone follow up appointment with care management team member scheduled for:  12-14-2021 at 345 pm  [place], MSN, [facility] Mobile: [phone]

## 2021-09-29 ENCOUNTER — Ambulatory Visit: Payer: Medicare Other | Admitting: Cardiology

## 2021-10-18 ENCOUNTER — Ambulatory Visit: Payer: Self-pay

## 2021-10-18 ENCOUNTER — Telehealth: Payer: Medicare Other | Admitting: Nurse Practitioner

## 2021-10-18 DIAGNOSIS — R21 Rash and other nonspecific skin eruption: Secondary | ICD-10-CM

## 2021-10-18 NOTE — Progress Notes (Signed)
Spoke with patient over the phone prefers video visit.    Based on what you shared with me, I feel your condition warrants further evaluation and I recommend that you be seen for a face to face visit.  Please contact your primary care physician practice to be seen. Many offices offer virtual options to be seen via video if you are not comfortable going in person to a medical facility at this time.  NOTE: You will NOT be charged for this eVisit.  If you do not have a PCP, [facility] offers a free physician referral service available at [phone]. Our trained staff has the experience, knowledge and resources to put you in touch with a physician who is right for you.    If you are having a true medical emergency please call 911.   Your e-visit answers were reviewed by a board certified advanced clinical practitioner to complete your personal care plan.  Thank you for using e-Visits.

## 2021-10-18 NOTE — Telephone Encounter (Signed)
° ° ° ° ° ° ° °  Chief Complaint: itching rash Symptoms: severe itching Frequency: continuous Pertinent Negatives: Patient denies pain Disposition: [] ED /Urgent Care (no appt availability in office) / [] Appointment(In office/virtual)/ []  [place] [facility]/ [] Home Care/ [] Refused Recommended Disposition  Additional Notes: pt stated t is shinles due to rash and blisters  Answer Assessment - Initial Assessment Questions 1. APPEARANCE of RASH: "Describe the rash."      Red rash with blister 2. LOCATION: "Where is the rash located?"      Stomach, arm, and hand and arm 3. ONSET: "When did the rash start?"      Christmas 4. ITCHING: "Does the rash itch?" If Yes, ask: "How bad is the itch?"  (Scale 1-10; or mild, moderate, severe)     10 5. PAIN: "Does the rash hurt?" If Yes, ask: "How bad is the pain?"  (Scale 0-10; or none, mild, moderate, severe)    - NONE (0): no pain    - MILD (1-3): doesn't interfere with normal activities     - MODERATE (4-7): interferes with normal activities or awakens from sleep     - SEVERE (8-10): excruciating pain, unable to do any normal activities     no 6. OTHER SYMPTOMS: "Do you have any other symptoms?" (e.g., fever)     no 7. PREGNANCY: "Is there any chance you are pregnant?" "When was your last menstrual period?"     *No Answer*  Protocols used: Shingles (Zoster)-A-AH

## 2021-10-20 ENCOUNTER — Ambulatory Visit: Payer: Medicare Other | Admitting: Nurse Practitioner

## 2021-10-21 ENCOUNTER — Ambulatory Visit: Payer: Self-pay | Admitting: *Deleted

## 2021-10-21 DIAGNOSIS — Z20822 Contact with and (suspected) exposure to covid-19: Secondary | ICD-10-CM | POA: Diagnosis not present

## 2021-10-21 NOTE — Telephone Encounter (Signed)
Reason for Disposition  Taking a deep breath makes pain worse  Answer Assessment - Initial Assessment Questions 1. LOCATION: "Where does it hurt?"       When I breath in it hurts left to right and between my breasts and going down.    I just started coughing yesterday.   2. RADIATION: "Does the pain go anywhere else?" (e.g., into neck, jaw, arms, back)     My chest.  I'm a little short of breath but I have emphysema.   Not on O2. 3. ONSET: "When did the chest pain begin?" (Minutes, hours or days)      Chest pain started at 7:00 this morning. 4. PATTERN "Does the pain come and go, or has it been constant since it started?"  "Does it get worse with exertion?"      Just when I breath in it hurts. 5. DURATION: "How long does it last" (e.g., seconds, minutes, hours)     *No Answer* 6. SEVERITY: "How bad is the pain?"  (e.g., Scale 1-10; mild, moderate, or severe)    - MILD (1-3): doesn't interfere with normal activities     - MODERATE (4-7): interferes with normal activities or awakens from sleep    - SEVERE (8-10): excruciating pain, unable to do any normal activities       *No Answer* 7. CARDIAC RISK FACTORS: "Do you have any history of heart problems or risk factors for heart disease?" (e.g., angina, prior heart attack; diabetes, high blood pressure, high cholesterol, smoker, or strong family history of heart disease)     I was like this yesterday morning too.   I also have anxiety.   8. PULMONARY RISK FACTORS: "Do you have any history of lung disease?"  (e.g., blood clots in lung, asthma, emphysema, birth control pills)     Empysema    I feel like something is stuck in my esophagus.     I'm getting diarrhea.    No vomiting.   9. CAUSE: "What do you think is causing the chest pain?"     I think I'm getting sick with something.   I came into the office on the wrong day.   I think I have shingles and it comes and goes.    My appt was yesterday but I could not get back there.    All over my chest  arms, back, legs.    10. OTHER SYMPTOMS: "Do you have any other symptoms?" (e.g., dizziness, nausea, vomiting, sweating, fever, difficulty breathing, cough)        Possible shingles 11. PREGNANCY: "Is there any chance you are pregnant?" "When was your last menstrual period?"       N/A  Protocols used: Chest Pain-A-AH

## 2021-10-21 NOTE — Telephone Encounter (Signed)
°  Chief Complaint: chest pain with deep breath and a rash on chest, back, arms and legs that is painful. Symptoms: chest pain with deep breaths, rash over multiple areas of her body that hurts Frequency: constantly Pertinent Negatives: Patient denies shortness of breath but sounds short of breath Disposition: [] ED /[x] Urgent Care (no appt availability in office) / [] Appointment(In office/virtual)/ []  [facility]/ [] Home Care/ [] Refused Recommended Disposition /[] [place] [facility]/ []  Follow-up with PCP Additional Notes: I have referred her to the urgent care since there are no openings at [facility] after I called in to see if she could be worked in.   She's going to call her daughter to take her now.    I did make her an appt with Marnee Guarneri, NP for 10/25/2021 at 3:00 in office visit.

## 2021-10-22 ENCOUNTER — Ambulatory Visit: Payer: Self-pay

## 2021-10-25 ENCOUNTER — Other Ambulatory Visit: Payer: Self-pay

## 2021-10-25 ENCOUNTER — Encounter: Payer: Self-pay | Admitting: Nurse Practitioner

## 2021-10-25 ENCOUNTER — Ambulatory Visit (INDEPENDENT_AMBULATORY_CARE_PROVIDER_SITE_OTHER): Payer: Medicare Other | Admitting: Nurse Practitioner

## 2021-10-25 ENCOUNTER — Ambulatory Visit
Admission: EM | Admit: 2021-10-25 | Discharge: 2021-10-25 | Disposition: A | Discharge status: Home / Self Care | Payer: Medicare Other

## 2021-10-25 VITALS — BP 117/84 | HR 90 | Temp 98.7°F | Wt 171.0 lb

## 2021-10-25 DIAGNOSIS — R21 Rash and other nonspecific skin eruption: Secondary | ICD-10-CM | POA: Diagnosis not present

## 2021-10-25 DIAGNOSIS — T7840XA Allergy, unspecified, initial encounter: Secondary | ICD-10-CM | POA: Diagnosis not present

## 2021-10-25 DIAGNOSIS — M0579 Rheumatoid arthritis with rheumatoid factor of multiple sites without organ or systems involvement: Secondary | ICD-10-CM

## 2021-10-25 MED ORDER — FAMOTIDINE 20 MG PO TABS: 20.0000 mg | ORAL_TABLET | Freq: Two times a day (BID) | ORAL | 4 refills | Status: DC

## 2021-10-25 MED ORDER — DIPHENHYDRAMINE HCL 25 MG PO CAPS: 25.0000 mg | ORAL_CAPSULE | Freq: Four times a day (QID) | ORAL | 0 refills | Status: DC | PRN

## 2021-10-25 MED ORDER — HYDROXYZINE PAMOATE 25 MG PO CAPS: 25.0000 mg | ORAL_CAPSULE | Freq: Three times a day (TID) | ORAL | 0 refills | Status: DC | PRN

## 2021-10-25 MED ORDER — DIPHENHYDRAMINE-ZINC ACETATE 2-0.1 % EX CREA: 1.0000 "application " | TOPICAL_CREAM | Freq: Three times a day (TID) | CUTANEOUS | 1 refills | Status: DC | PRN

## 2021-10-25 MED ORDER — TRIAMCINOLONE ACETONIDE 40 MG/ML IJ SUSP
40.0000 mg | Freq: Once | INTRAMUSCULAR | Status: AC
  Administered 2021-10-25: 40 mg via INTRAMUSCULAR

## 2021-10-25 MED ORDER — PREDNISONE 10 MG PO TABS: ORAL_TABLET | ORAL | 0 refills | Status: DC

## 2021-10-25 NOTE — Assessment & Plan Note (Signed)
Chronic, ongoing.  Followed by rheumatology, continue this collaboration.  At this time continue current medications ordered by them and adjust as needed.  Continue taking Duloxetine, which is beneficial for pain and mood.  Labs up to date.

## 2021-10-25 NOTE — Patient Instructions (Signed)
Rash, Adult  A rash is a change in the color of your skin. A rash can also change the way your skin feels. There are many different conditions and factors that can causea rash. Follow these instructions at home: The goal of treatment is to stop the itching and keep the rash from spreading. Watch for any changes in your symptoms. Let your doctor know about them. Followthese instructions to help with your condition: Medicine Take or apply over-the-counter and prescription medicines only as told by your doctor. These may include medicines: To treat red or swollen skin (corticosteroid creams). To treat itching. To treat an allergy (oral antihistamines). To treat very bad symptoms (oral corticosteroids).  Skin care Put cool cloths (compresses) on the affected areas. Do not scratch or rub your skin. Avoid covering the rash. Make sure that the rash is exposed to air as much as possible. Managing itching and discomfort Avoid hot showers or baths. These can make itching worse. A cold shower may help. Try taking a bath with: Epsom salts. You can get these at your local pharmacy or grocery store. Follow the instructions on the package. Baking soda. Pour a small amount into the bath as told by your doctor. Colloidal oatmeal. You can get this at your local pharmacy or grocery store. Follow the instructions on the package. Try putting baking soda paste onto your skin. Stir water into baking soda until it gets like a paste. Try putting on a lotion that relieves itchiness (calamine lotion). Keep cool and out of the sun. Sweating and being hot can make itching worse. General instructions  Rest as needed. Drink enough fluid to keep your pee (urine) pale yellow. Wear loose-fitting clothing. Avoid scented soaps, detergents, and perfumes. Use gentle soaps, detergents, perfumes, and other cosmetic products. Avoid anything that causes your rash. Keep a journal to help track what causes your rash. Write  down: What you eat. What cosmetic products you use. What you drink. What you wear. This includes jewelry. Keep all follow-up visits as told by your doctor. This is important.  Contact a doctor if: You sweat at night. You lose weight. You pee (urinate) more than normal. You pee less than normal, or you notice that your pee is a darker color than normal. You feel weak. You throw up (vomit). Your skin or the whites of your eyes look yellow (jaundice). Your skin: Tingles. Is numb. Your rash: Does not go away after a few days. Gets worse. You are: More thirsty than normal. More tired than normal. You have: New symptoms. Pain in your belly (abdomen). A fever. Watery poop (diarrhea). Get help right away if: You have a fever and your symptoms suddenly get worse. You start to feel mixed up (confused). You have a very bad headache or a stiff neck. You have very bad joint pains or stiffness. You have jerky movements that you cannot control (seizure). Your rash covers all or most of your body. The rash may or may not be painful. You have blisters that: Are on top of the rash. Grow larger. Grow together. Are painful. Are inside your nose or mouth. You have a rash that: Looks like purple pinprick-sized spots all over your body. Has a "bull's eye" or looks like a target. Is red and painful, causes your skin to peel, and is not from being in the sun too long. Summary A rash is a change in the color of your skin. A rash can also change the way your skin feels.   The goal of treatment is to stop the itching and keep the rash from spreading. Take or apply over-the-counter and prescription medicines only as told by your doctor. Contact a doctor if you have new symptoms or symptoms that get worse. Keep all follow-up visits as told by your doctor. This is important. This information is not intended to replace advice given to you by your health care provider. Make sure you discuss any  questions you have with your healthcare provider. Document Revised: 01/31/2019 Document Reviewed: 05/13/2018 Elsevier Patient Education  2022 [employer]  

## 2021-10-25 NOTE — Progress Notes (Signed)
BP 117/84    Pulse 90 Comment: apical   Temp 98.7 F (37.1 C) (Oral)    Wt 171 lb (77.6 kg)    SpO2 98%    BMI 31.28 kg/m    Subjective:    Patient ID: Catherine Giles, female    DOB: 16-Aug-1971, 51 y.o.   MRN: [id number]  HPI: Catherine Giles is a 51 y.o. female  Chief Complaint  Patient presents with   Rash    Pt states she has a rash that she first noticed on Christmas. States the rash is all over and is very painful. States she has done an E visit and nothing was given to her.    RASH Started out with rash and discomfort to chest on Christmas Day evening, has E Visit on 10/18/21 -- who recommended she see PCP, she reports this was pointless as no treatment provided.  Reports she is having discomfort in esophagus with this rash.  She does not recall eating anything different or using difference products at home.  Had Covid testing which was negative.    She is followed by rheumatology for her RA, is going for infusions with last one at end of November.  Currently taking Prednisone 10 MG every day.  Has gone for allergy testing in past, last over summer.   Duration:  days  Location: generalized  Itching: yes Burning: no Redness: yes Oozing: no Scaling: no Blisters: no Painful: yes Fevers: no Change in detergents/soaps/personal care products: no Recent illness: no Recent travel:no History of same: no Context: worse Alleviating factors: benadryl cream Treatments attempted:benadryl cream -- out of benadryl pills Shortness of breath: no  Throat/tongue swelling: no Myalgias/arthralgias: no   Relevant past medical, surgical, family and social history reviewed and updated as indicated. Interim medical history since our last visit reviewed. Allergies and medications reviewed and updated.  Review of Systems  Constitutional:  Negative for activity change, appetite change, diaphoresis, fatigue and fever.  Respiratory:  Negative for cough, chest tightness, shortness of breath and  wheezing.   Cardiovascular:  Negative for chest pain, palpitations and leg swelling.  Endocrine: Negative for cold intolerance, heat intolerance, polydipsia, polyphagia and polyuria.  Musculoskeletal:  Positive for arthralgias.  Skin:  Positive for rash.  Neurological: Negative.   Psychiatric/Behavioral:  Negative for decreased concentration, self-injury, sleep disturbance and suicidal ideas. The patient is nervous/anxious.    Per HPI unless specifically indicated above     Objective:    BP 117/84    Pulse 90 Comment: apical   Temp 98.7 F (37.1 C) (Oral)    Wt 171 lb (77.6 kg)    SpO2 98%    BMI 31.28 kg/m   Wt Readings from Last 3 Encounters:  10/25/21 171 lb (77.6 kg)  09/05/21 173 lb 9.6 oz (78.7 kg)  08/16/21 171 lb 9.6 oz (77.8 kg)    Physical Exam Vitals and nursing note reviewed.  Constitutional:      General: She is awake. She is not in acute distress.    Appearance: She is well-developed and well-groomed. She is obese. She is not ill-appearing or toxic-appearing.  HENT:     Head: Normocephalic.     Right Ear: Hearing normal.     Left Ear: Hearing normal.  Eyes:     General: Lids are normal.        Right eye: No discharge.        Left eye: No discharge.     Conjunctiva/sclera: Conjunctivae normal.  Pupils: Pupils are equal, round, and reactive to light.  Neck:     Thyroid: No thyromegaly.     Vascular: No carotid bruit.  Cardiovascular:     Rate and Rhythm: Normal rate and regular rhythm.     Heart sounds: Normal heart sounds. No murmur heard.   No gallop.  Pulmonary:     Effort: Pulmonary effort is normal. No accessory muscle usage or respiratory distress.     Breath sounds: Normal breath sounds.  Abdominal:     General: Bowel sounds are normal.     Palpations: Abdomen is soft. There is no hepatomegaly or splenomegaly.  Musculoskeletal:     Cervical back: Normal range of motion and neck supple.     Right lower leg: No edema.     Left lower leg: No  edema.  Skin:    General: Skin is warm and dry.     Findings: Rash present. Rash is urticarial.     Comments: Welts present from head to toe scattered from arms, to abdomen, to back, to legs.  Areas of linear scratch marks to welts on arms.  Welts vary in size from 2 cm to 4 cm.  Skin intact.  Neurological:     Mental Status: She is alert and oriented to person, place, and time.  Psychiatric:        Attention and Perception: Attention normal.        Mood and Affect: Mood normal.        Speech: Speech normal.        Behavior: Behavior normal. Behavior is cooperative.        Thought Content: Thought content normal.    Results for orders placed or performed during the hospital encounter of 09/12/21  Covid-19, Flu A+B ([facility])   Specimen: Nasopharyngeal   Naso  Result Value Ref Range   SARS-CoV-2, NAA Not Detected Not Detected   Influenza A, NAA Not Detected Not Detected   Influenza B, NAA Not Detected Not Detected   Test Information: Comment       Assessment & Plan:   Problem List Items Addressed This Visit       Musculoskeletal and Integument   Rash due to allergy    Acute x 10 days scattered all over body with welts.  Appears allergic in nature, ?cause of allergic reaction.  No recent medication changes or home care changes per patient.  Had RA infusion last end of November, ?cause, recommend she reach out to rheumatology.  At this time Kenalog shot in office provider and will start Prednisone taper, then she can return to her 10 MG daily dosing.  Famotidine 20 MG BID and Benadryl cream + Vistaril as needed sent in.  Recommend oatmeal baths at home and avoiding any new foods or products at this time.  Return in one week.  May need to return for allergy testing.      Relevant Medications   triamcinolone acetonide (KENALOG-40) injection 40 mg   Rheumatoid arthritis involving multiple sites with positive rheumatoid factor (HCC) - Primary    Chronic, ongoing.  Followed by  rheumatology, continue this collaboration.  At this time continue current medications ordered by them and adjust as needed.  Continue taking Duloxetine, which is beneficial for pain and mood.  Labs up to date.      Relevant Medications   triamcinolone acetonide (KENALOG-40) injection 40 mg   predniSONE (DELTASONE) 10 MG tablet     Follow up plan: Return in about  1 week (around 11/01/2021) for Rash.

## 2021-10-25 NOTE — Assessment & Plan Note (Signed)
Acute x 10 days scattered all over body with welts.  Appears allergic in nature, ?cause of allergic reaction.  No recent medication changes or home care changes per patient.  Had RA infusion last end of November, ?cause, recommend she reach out to rheumatology.  At this time Kenalog shot in office provider and will start Prednisone taper, then she can return to her 10 MG daily dosing.  Famotidine 20 MG BID and Benadryl cream + Vistaril as needed sent in.  Recommend oatmeal baths at home and avoiding any new foods or products at this time.  Return in one week.  May need to return for allergy testing.

## 2021-10-28 ENCOUNTER — Encounter: Payer: Self-pay | Admitting: Nurse Practitioner

## 2021-10-30 ENCOUNTER — Other Ambulatory Visit: Payer: Self-pay

## 2021-10-30 ENCOUNTER — Ambulatory Visit (INDEPENDENT_AMBULATORY_CARE_PROVIDER_SITE_OTHER): Payer: Medicare Other

## 2021-10-30 ENCOUNTER — Ambulatory Visit
Admission: EM | Admit: 2021-10-30 | Discharge: 2021-10-30 | Disposition: A | Discharge status: Home / Self Care | Payer: Medicare Other | Attending: Physician Assistant | Admitting: Physician Assistant

## 2021-10-30 DIAGNOSIS — M797 Fibromyalgia: Secondary | ICD-10-CM | POA: Diagnosis not present

## 2021-10-30 DIAGNOSIS — F17210 Nicotine dependence, cigarettes, uncomplicated: Secondary | ICD-10-CM | POA: Diagnosis not present

## 2021-10-30 DIAGNOSIS — R051 Acute cough: Secondary | ICD-10-CM | POA: Diagnosis not present

## 2021-10-30 DIAGNOSIS — J449 Chronic obstructive pulmonary disease, unspecified: Secondary | ICD-10-CM | POA: Diagnosis not present

## 2021-10-30 DIAGNOSIS — R059 Cough, unspecified: Secondary | ICD-10-CM | POA: Diagnosis not present

## 2021-10-30 DIAGNOSIS — E119 Type 2 diabetes mellitus without complications: Secondary | ICD-10-CM | POA: Insufficient documentation

## 2021-10-30 DIAGNOSIS — Z8709 Personal history of other diseases of the respiratory system: Secondary | ICD-10-CM | POA: Diagnosis not present

## 2021-10-30 DIAGNOSIS — Z20822 Contact with and (suspected) exposure to covid-19: Secondary | ICD-10-CM | POA: Diagnosis not present

## 2021-10-30 DIAGNOSIS — R0602 Shortness of breath: Secondary | ICD-10-CM | POA: Insufficient documentation

## 2021-10-30 DIAGNOSIS — M329 Systemic lupus erythematosus, unspecified: Secondary | ICD-10-CM | POA: Diagnosis not present

## 2021-10-30 DIAGNOSIS — I10 Essential (primary) hypertension: Secondary | ICD-10-CM | POA: Diagnosis not present

## 2021-10-30 MED ORDER — IPRATROPIUM-ALBUTEROL 0.5-2.5 (3) MG/3ML IN SOLN: 3.0000 mL | Freq: Four times a day (QID) | RESPIRATORY_TRACT | 0 refills | Status: DC | PRN
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
## 2021-10-30 NOTE — Discharge Instructions (Addendum)
-  Your x-ray looks good today.  Your oxygen is in a good range. - Your lungs are clear. - We have obtained a COVID test.  The result be back tomorrow we will call you if it is positive.  If it is positive we can send antiviral medicine for you.  If it is positive you need to be isolated 5 days and wear mask for 5 days. - Keep your appointment with your pulmonologist tomorrow. - Go to ER for any worsening of your breathing before that though. - I have refilled your nebulizer solution. - I am very sorry for what you are going through.  I hope everything works out.
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
## 2021-10-31 LAB — SARS CORONAVIRUS 2 (TAT 6-24 HRS): SARS Coronavirus 2: NEGATIVE

## 2021-11-04 ENCOUNTER — Ambulatory Visit: Payer: Medicare Other | Admitting: Nurse Practitioner

## 2021-11-08 DIAGNOSIS — M0579 Rheumatoid arthritis with rheumatoid factor of multiple sites without organ or systems involvement: Secondary | ICD-10-CM | POA: Diagnosis not present

## 2021-11-08 DIAGNOSIS — J441 Chronic obstructive pulmonary disease with (acute) exacerbation: Secondary | ICD-10-CM | POA: Diagnosis not present

## 2021-11-08 DIAGNOSIS — J45901 Unspecified asthma with (acute) exacerbation: Secondary | ICD-10-CM | POA: Diagnosis not present

## 2021-11-09 ENCOUNTER — Encounter: Payer: Self-pay | Admitting: Internal Medicine

## 2021-11-09 ENCOUNTER — Ambulatory Visit (INDEPENDENT_AMBULATORY_CARE_PROVIDER_SITE_OTHER): Payer: Medicare Other | Admitting: Internal Medicine

## 2021-11-09 ENCOUNTER — Other Ambulatory Visit: Payer: Self-pay

## 2021-11-09 VITALS — BP 158/78 | HR 86 | Temp 98.7°F | Ht 63.39 in | Wt 177.0 lb

## 2021-11-09 DIAGNOSIS — I10 Essential (primary) hypertension: Secondary | ICD-10-CM

## 2021-11-09 MED ORDER — HYDROCHLOROTHIAZIDE 12.5 MG PO TABS: 12.5000 mg | ORAL_TABLET | Freq: Every day | ORAL | 3 refills | Status: DC

## 2021-11-09 MED ORDER — ASPIRIN 81 MG PO TBEC: 81.0000 mg | DELAYED_RELEASE_TABLET | Freq: Every day | ORAL | 0 refills | Status: DC

## 2021-11-09 NOTE — Progress Notes (Signed)
BP (!) 158/78 Comment: Manual   Pulse 86    Temp 98.7 F (37.1 C) (Oral)    Ht 5' 3.39" (1.61 m)    Wt 177 lb (80.3 kg)    SpO2 98%    BMI 30.97 kg/m    Subjective:    Patient ID: Catherine Giles, female    DOB: [birth year], 51 y.o.   MRN: [id number]  Chief Complaint  Patient presents with   Hypertension    Has been running high    Fatigue    HPI: Catherine Giles is a 51 y.o. female  Patient presents with: Hypertension: Has been running high  Fatigue  Elevated Bp was in the ER x 1 week ago, was in rheum / pulm office and was high too.  Hypertension This is a chronic problem. The current episode started more than 1 year ago. The problem has been waxing and waning since onset. The problem is uncontrolled. Pertinent negatives include no anxiety, blurred vision, chest pain, headaches, malaise/fatigue, neck pain, orthopnea, palpitations, peripheral edema, PND, shortness of breath or sweats.   Chief Complaint  Patient presents with   Hypertension    Has been running high    Fatigue    Relevant past medical, surgical, family and social history reviewed and updated as indicated. Interim medical history since our last visit reviewed. Allergies and medications reviewed and updated.  Review of Systems  Constitutional:  Negative for malaise/fatigue.  Eyes:  Negative for blurred vision.  Respiratory:  Negative for shortness of breath.   Cardiovascular:  Negative for chest pain, palpitations, orthopnea and PND.  Musculoskeletal:  Negative for neck pain.  Neurological:  Negative for headaches.   Per HPI unless specifically indicated above     Objective:    BP (!) 158/78 Comment: Manual   Pulse 86    Temp 98.7 F (37.1 C) (Oral)    Ht 5' 3.39" (1.61 m)    Wt 177 lb (80.3 kg)    SpO2 98%    BMI 30.97 kg/m   Wt Readings from Last 3 Encounters:  11/09/21 177 lb (80.3 kg)  10/30/21 170 lb (77.1 kg)  10/25/21 171 lb (77.6 kg)    Physical Exam Vitals and nursing note reviewed.   Constitutional:      General: She is not in acute distress.    Appearance: Normal appearance. She is not ill-appearing or diaphoretic.  Eyes:     Conjunctiva/sclera: Conjunctivae normal.  Cardiovascular:     Rate and Rhythm: Normal rate and regular rhythm.     Heart sounds: No murmur heard. Pulmonary:     Effort: No respiratory distress.     Breath sounds: No stridor. Wheezing present. No rhonchi or rales.  Neurological:     Mental Status: She is alert.    Results for orders placed or performed during the hospital encounter of 10/30/21  SARS CORONAVIRUS 2 (TAT 6-24 HRS) Nasopharyngeal Nasopharyngeal Swab   Specimen: Nasopharyngeal Swab  Result Value Ref Range   SARS Coronavirus 2 NEGATIVE NEGATIVE        Current Outpatient Medications:    albuterol (PROVENTIL) (2.5 MG/3ML) 0.083% nebulizer solution, Take 3 mLs (2.5 mg total) by nebulization every 6 (six) hours as needed for wheezing or shortness of breath., Disp: 75 mL, Rfl: 12   amLODipine (NORVASC) 10 MG tablet, Take 1 tablet (10 mg total) by mouth daily., Disp: 90 tablet, Rfl: 4   aspirin 81 MG EC tablet, Take 1 tablet (81 mg total) by mouth  daily. Swallow whole., Disp: 30 tablet, Rfl: 0   Blood Glucose Monitoring Suppl (ONETOUCH VERIO) w/Device KIT, Use to check blood sugar 3 times daily with goal <130 fasting and <180 two hours after meal.  Bring meter to visits., Disp: 1 kit, Rfl: 0   busPIRone (BUSPAR) 5 MG tablet, Take 1 tablet (5 mg total) by mouth 2 (two) times daily., Disp: 180 tablet, Rfl: 4   cyproheptadine (PERIACTIN) 4 MG tablet, Take 1 tablet (4 mg total) by mouth at bedtime., Disp: 30 tablet, Rfl: 5   dapagliflozin propanediol (FARXIGA) 10 MG TABS tablet, Take 1 tablet (10 mg total) by mouth daily before breakfast., Disp: 90 tablet, Rfl: 4   diphenhydrAMINE-zinc acetate (BENADRYL EXTRA STRENGTH) cream, Apply 1 application topically 3 (three) times daily as needed for itching., Disp: 28.4 g, Rfl: 1   famotidine  (PEPCID) 20 MG tablet, Take 1 tablet (20 mg total) by mouth 2 (two) times daily., Disp: 180 tablet, Rfl: 4   fexofenadine (ALLEGRA ALLERGY) 180 MG tablet, Take 1 tablet (180 mg total) by mouth daily., Disp: 15 tablet, Rfl: 0   fluticasone (FLONASE) 50 MCG/ACT nasal spray, Place 2 sprays into both nostrils daily., Disp: 16 g, Rfl: 0   Fluticasone-Umeclidin-Vilant (TRELEGY ELLIPTA) 100-62.5-25 MCG/INH AEPB, Inhale 1 puff into the lungs daily., Disp: , Rfl:    glucose blood test strip, Use to check blood sugar 3 times daily with goal <130 fasting and <180 two hours after meal., Disp: 100 each, Rfl: 12   golimumab (SIMPONI ARIA) 50 MG/4ML SOLN injection, Inject into the vein., Disp: , Rfl:    hydrochlorothiazide (HYDRODIURIL) 12.5 MG tablet, Take 1 tablet (12.5 mg total) by mouth daily., Disp: 30 tablet, Rfl: 3   hydrOXYzine (VISTARIL) 25 MG capsule, Take 1 capsule (25 mg total) by mouth every 8 (eight) hours as needed., Disp: 60 capsule, Rfl: 0   ibuprofen (ADVIL) 400 MG tablet, Take 1 tablet (400 mg total) by mouth every 8 (eight) hours as needed., Disp: 30 tablet, Rfl: 0   Lactobacillus PACK, Take 1 each by mouth as needed (for diarrhea.)., Disp: 20 each, Rfl: 0   Lancets (ONETOUCH ULTRASOFT) lancets, Use to check blood sugar 3 times daily with goal <130 fasting and <180 two hours after meal., Disp: 100 each, Rfl: 12   leflunomide (ARAVA) 20 MG tablet, Take 20 mg by mouth daily., Disp: , Rfl:    levothyroxine (SYNTHROID) 50 MCG tablet, Take 50 mcg by mouth daily before breakfast. , Disp: , Rfl:    liothyronine (CYTOMEL) 5 MCG tablet, Take 5 mcg by mouth daily., Disp: , Rfl:    losartan (COZAAR) 100 MG tablet, Take 1 tablet (100 mg total) by mouth daily., Disp: 90 tablet, Rfl: 0   metFORMIN (GLUCOPHAGE) 500 MG tablet, Take 1 tablet (500 mg total) by mouth 2 (two) times daily with a meal., Disp: 180 tablet, Rfl: 3   montelukast (SINGULAIR) 10 MG tablet, Take 10 mg by mouth at bedtime., Disp: , Rfl:     nicotine (NICOTINE STEP 1) 21 mg/24hr patch, Place 1 patch (21 mg total) onto the skin daily., Disp: 28 patch, Rfl: 0   ondansetron (ZOFRAN ODT) 4 MG disintegrating tablet, Take 1 tablet (4 mg total) by mouth every 8 (eight) hours as needed for nausea or vomiting., Disp: 20 tablet, Rfl: 0   predniSONE (DELTASONE) 10 MG tablet, Take 6 tablets by mouth daily for 2 days, then reduce by 1 tablet every 2 days until gone, Disp: 42 tablet,  Rfl: 0   tiZANidine (ZANAFLEX) 4 MG tablet, Take 1 tablet (4 mg total) by mouth every 8 (eight) hours as needed for muscle spasms., Disp: 90 tablet, Rfl: 0   albuterol (VENTOLIN HFA) 108 (90 Base) MCG/ACT inhaler, Inhale 2 puffs into the lungs every 4 (four) hours as needed. , Disp: , Rfl:    ipratropium-albuterol (DUONEB) 0.5-2.5 (3) MG/3ML SOLN, Take 3 mLs by nebulization every 6 (six) hours as needed for up to 7 days., Disp: 360 mL, Rfl: 0    Assessment & Plan:  COPD  Sees pulm  Chaned her meds  Is on trelegy for such and albuterol .  2. HTN is losartan 100 mg , norvasc 10 mg  Add hctz. To fu with PCP x 1 week Continue current meds.  Medication compliance emphasised. pt advised to keep Bp logs. Pt verbalised understanding of the same. Pt to have a low salt diet . Exercise to reach a goal of at least 150 mins a week.  lifestyle modifications explained and pt understands importance of the above. Under good control on current regimen. Continue current regimen. Continue to monitor. Call with any concerns. Refills given. Labs drawn today.   Problem List Items Addressed This Visit       Cardiovascular and Mediastinum   Primary hypertension - Primary   Relevant Medications   hydrochlorothiazide (HYDRODIURIL) 12.5 MG tablet   aspirin 81 MG EC tablet     No orders of the defined types were placed in this encounter.    Meds ordered this encounter  Medications   hydrochlorothiazide (HYDRODIURIL) 12.5 MG tablet    Sig: Take 1 tablet (12.5 mg total) by mouth  daily.    Dispense:  30 tablet    Refill:  3   aspirin 81 MG EC tablet    Sig: Take 1 tablet (81 mg total) by mouth daily. Swallow whole.    Dispense:  30 tablet    Refill:  0     Follow up plan: Return in about 1 week (around 11/16/2021).

## 2021-11-09 NOTE — Patient Instructions (Signed)
Fluticasone; Umeclidinium; Vilanterol inhalation powder What is this medication? FLUTICASONE; UMECLIDINIUM; VILANTEROL (floo TIK a sone; ue MEK li DIN ee um; vye LAN ter ol) inhalation is a combination of 3 drugs to treat COPD and asthma. Umeclidinium and Vilanterol are bronchodilators that help keep airways open. Fluticasone decreases inflammation in the lungs. Do not use this drug combination for acute asthma attacks or bronchospasm. This medicine may be used for other purposes; ask your health care provider or pharmacist if you have questions. COMMON BRAND NAME(S): TRELEGY ELLIPTA What should I tell my care team before I take this medication? They need to know if you have any of these conditions: bone problems diabetes eye disease, vision problems heart disease high blood pressure history of irregular heartbeat immune system problems infection kidney disease pheochromocytoma prostate disease seizures thyroid disease trouble passing urine an unusual or allergic reaction to fluticasone, umeclidinium, vilanterol, lactose, milk proteins, other medicines, foods, dyes, or preservatives pregnant or trying to get pregnant breast-feeding How should I use this medication? This drug is inhaled through the mouth. Rinse your mouth with water after use. Make sure not to swallow the water. Take it as directed on the prescription label at the same time every day. Do not use it more often than directed. A special MedGuide will be given to you by the pharmacist with each prescription and refill. Be sure to read this information carefully each time. Talk to your pediatrician about the use of this drug in children. Special care may be needed. Overdosage: If you think you have taken too much of this medicine contact a poison control center or emergency room at once. NOTE: This medicine is only for you. Do not share this medicine with others. What if I miss a dose? If you miss a dose, take it as soon as  you can. If it is almost time for your next dose, take only that dose. Do not take double or extra doses. What may interact with this medication? Do not take this medicine with any of the following medications: cisapride dofetilide dronedarone MAOIs like Carbex, Eldepryl, Marplan, Nardil, and Parnate pimozide thioridazine ziprasidone This medicine may also interact with the following medications: aclidinium antihistamines for allergy antiviral medicines for HIV or AIDS atropine beta-blockers like metoprolol and propranolol certain antibiotics like clarithromycin and telithromycin certain medicines for bladder problems like oxybutynin, tolterodine certain medicines for depression, anxiety, or psychotic disturbances certain medicines for fungal infections like ketoconazole, itraconazole, posaconazole, voriconazole certain medicines for Parkinson's disease like benztropine, trihexyphenidyl certain medicines for stomach problems like dicyclomine, hyoscyamine certain medicines for travel sickness like scopolamine conivaptan diuretics ipratropium medicines for colds other medicines for breathing problems other medicines that prolong the QT interval (cause an abnormal heart rhythm) nefazodone tiotropium This list may not describe all possible interactions. Give your health care provider a list of all the medicines, herbs, non-prescription drugs, or dietary supplements you use. Also tell them if you smoke, drink alcohol, or use illegal drugs. Some items may interact with your medicine. What should I watch for while using this medication? Visit your doctor or health care professional for regular checkups. Tell your doctor or health care professional if your symptoms do not get better. Do not use this medicine more than once every 24 hours. NEVER use this medicine for an acute asthma or COPD attack. You should use your short-acting rescue inhalers for this purpose. If your symptoms get worse  or if you need your short-acting inhalers more often, call your doctor  right away. If you are going to have surgery tell your doctor or health care professional that you are using this medicine. Try not to come in contact with people with the chicken pox or measles. If you do, call your doctor. This medicine may increase blood sugar. Ask your healthcare provider if changes in diet or medicines are needed if you have diabetes. What side effects may I notice from receiving this medication? Side effects that you should report to your doctor or health care professional as soon as possible: allergic reactions like skin rash or hives, swelling of the face, lips, or tongue breathing problems right after inhaling your medicine chest pain eye pain fast, irregular heartbeat feeling faint or lightheaded, falls fever or chills nausea, vomiting signs and symptoms of high blood sugar such as being more thirsty or hungry or having to urinate more than normal. You may also feel very tired or have blurry vision. trou diarrhea

## 2021-11-14 DIAGNOSIS — I10 Essential (primary) hypertension: Secondary | ICD-10-CM | POA: Insufficient documentation

## 2021-11-22 ENCOUNTER — Ambulatory Visit (INDEPENDENT_AMBULATORY_CARE_PROVIDER_SITE_OTHER): Payer: Medicare Other | Admitting: Cardiovascular Disease

## 2021-11-22 ENCOUNTER — Encounter: Payer: Self-pay | Admitting: Cardiovascular Disease

## 2021-11-22 ENCOUNTER — Other Ambulatory Visit: Payer: Self-pay

## 2021-11-22 VITALS — BP 150/88 | HR 90 | Ht 63.0 in | Wt 176.0 lb

## 2021-11-22 DIAGNOSIS — I152 Hypertension secondary to endocrine disorders: Secondary | ICD-10-CM

## 2021-11-22 DIAGNOSIS — E1159 Type 2 diabetes mellitus with other circulatory complications: Secondary | ICD-10-CM | POA: Diagnosis not present

## 2021-11-22 DIAGNOSIS — E669 Obesity, unspecified: Secondary | ICD-10-CM

## 2021-11-22 DIAGNOSIS — E1169 Type 2 diabetes mellitus with other specified complication: Secondary | ICD-10-CM | POA: Diagnosis not present

## 2021-11-22 DIAGNOSIS — M0579 Rheumatoid arthritis with rheumatoid factor of multiple sites without organ or systems involvement: Secondary | ICD-10-CM | POA: Diagnosis not present

## 2021-11-22 MED ORDER — CLONIDINE HCL 0.1 MG PO TABS: 0.1000 mg | ORAL_TABLET | Freq: Two times a day (BID) | ORAL | 3 refills | Status: DC

## 2021-11-22 MED ORDER — HYDROCHLOROTHIAZIDE 25 MG PO TABS: 25.0000 mg | ORAL_TABLET | Freq: Every day | ORAL | 3 refills | Status: DC

## 2021-11-22 NOTE — Patient Instructions (Addendum)
Medication Instructions:  Please START clonidine 0.1 mg twice a day Please try OTC zyrtec/cetirazine (allergy) 2. Please INCREASE  A. HTCZ to 25 mg daily  If you need a refill on your cardiac medications before your next appointment, please call your pharmacy.   Lab work: No new labs needed  Testing/Procedures: No new testing needed  Follow-Up: At [facility], you and your health needs are our priority.  As part of our continuing mission to provide you with exceptional heart care, we have created designated Provider Care Teams.  These Care Teams include your primary Cardiologist (physician) and Advanced Practice Providers (APPs -  Physician Assistants and Nurse Practitioners) who all work together to provide you with the care you need, when you need it.  You will need a follow up appointment as needed  Providers on your designated Care Team:   Murray Hodgkins, NP Christell Faith, PA-C Cadence Kathlen Mody, [place]  COVID-19 Vaccine Information can be found at: [email or web address] For questions related to vaccine distribution or appointments, please email [email or web address] or call [phone].

## 2021-11-22 NOTE — Progress Notes (Signed)
Cardiology Office Note  Date:  11/22/2021   ID:  Catherine Giles, DOB 02-25-1971, MRN [id number]  PCP:  Venita Lick, NP   Chief Complaint  Patient presents with   New Patient (Initial Visit)    Ref by Catherine Mention, NP for HTN associated with diabetes. Medications reviewed by the patient verbally. Patient c/o shortness of breath, chest pain this am, palpitations and elevated blood pressure. Medications reviewed by the patient verbally.     HPI:   Catherine Giles is a 51 year old woman with past medical history of Goiter Rheumatoid arthritis Hypertension Diabetes Emphysema Fibromyalgia Who presents for referral from [facility] for consultation of her hypertension  Primary care notes indicating shortness of breath with underlying emphysema, poorly controlled blood pressure Followed by pulmonary, Dr. Raul Del  On amlodipine 10, losartan 100, HCTZ 12.5  Seen in urgent care October 30, 2021 for shortness of breath On further reading of the note, has plumbing leaks from apartment above her, secondary to leaking has significant mold problem in the kitchen extending into living room She has used aggressive cleaning agents to try to clean up the mold, may have irritated her lungs She reports since then Blackmoldhas recurred, dripping down the walls  For worsening wheezing, asthmatic bronchitis, pulmonary has her on steroids  Blood pressure November 21, 2021 117/84 Blood pressure  November 09, 2021 158/78 BP at home 124 systolic  Echocardiogram July 2021 ejection fraction 60 to 65%  Presenting today with significant cough, wheezing  EKG personally reviewed by myself on todays visit Normal sinus rhythm rate of 90 bpm nonspecific T wave abnormality   PMH:   has a past medical history of Arthritis, Asthma, COPD (chronic obstructive pulmonary disease) ([place]), Depression, Diabetes mellitus without complication ([place]), Emphysema of lung ([place]), Family history of breast cancer,  Fibromyalgia, Hypertension, Lupus ([place]), Pre-diabetes, and Thyroid disease.  PSH:    Past Surgical History:  Procedure Laterality Date   MOUTH SURGERY     TRANSTHORACIC ECHOCARDIOGRAM  04/2020   EF 60 to 65%.  Normal LV function.  No R WMA.  GR 1 DD.  Normal RV size and function.  Normal PAP.  Normal atrial size.  Normal valves.    Current Outpatient Medications  Medication Sig Dispense Refill   albuterol (PROVENTIL) (2.5 MG/3ML) 0.083% nebulizer solution Take 3 mLs (2.5 mg total) by nebulization every 6 (six) hours as needed for wheezing or shortness of breath. 75 mL 12   albuterol (VENTOLIN HFA) 108 (90 Base) MCG/ACT inhaler Inhale 2 puffs into the lungs every 4 (four) hours as needed.      amLODipine (NORVASC) 10 MG tablet Take 1 tablet (10 mg total) by mouth daily. 90 tablet 4   aspirin 81 MG EC tablet Take 1 tablet (81 mg total) by mouth daily. Swallow whole. 30 tablet 0   Blood Glucose Monitoring Suppl (ONETOUCH VERIO) w/Device KIT Use to check blood sugar 3 times daily with goal <130 fasting and <180 two hours after meal.  Bring meter to visits. 1 kit 0   busPIRone (BUSPAR) 5 MG tablet Take 1 tablet (5 mg total) by mouth 2 (two) times daily. 180 tablet 4   cyproheptadine (PERIACTIN) 4 MG tablet Take 1 tablet (4 mg total) by mouth at bedtime. 30 tablet 5   dapagliflozin propanediol (FARXIGA) 10 MG TABS tablet Take 1 tablet (10 mg total) by mouth daily before breakfast. 90 tablet 4   diphenhydrAMINE-zinc acetate (BENADRYL EXTRA STRENGTH) cream Apply 1 application topically 3 (three)  times daily as needed for itching. 28.4 g 1   DULoxetine (CYMBALTA) 30 MG capsule Take 30 mg by mouth 2 (two) times daily.     famotidine (PEPCID) 20 MG tablet Take 1 tablet (20 mg total) by mouth 2 (two) times daily. 180 tablet 4   fexofenadine (ALLEGRA ALLERGY) 180 MG tablet Take 1 tablet (180 mg total) by mouth daily. 15 tablet 0   fluticasone (FLONASE) 50 MCG/ACT nasal spray Place 2 sprays into both  nostrils daily. 16 g 0   Fluticasone-Umeclidin-Vilant (TRELEGY ELLIPTA) 100-62.5-25 MCG/INH AEPB Inhale 1 puff into the lungs daily.     glucose blood test strip Use to check blood sugar 3 times daily with goal <130 fasting and <180 two hours after meal. 100 each 12   golimumab (SIMPONI ARIA) 50 MG/4ML SOLN injection Inject into the vein.     hydrochlorothiazide (HYDRODIURIL) 12.5 MG tablet Take 1 tablet (12.5 mg total) by mouth daily. 30 tablet 3   hydrOXYzine (VISTARIL) 25 MG capsule Take 1 capsule (25 mg total) by mouth every 8 (eight) hours as needed. 60 capsule 0   ibuprofen (ADVIL) 400 MG tablet Take 1 tablet (400 mg total) by mouth every 8 (eight) hours as needed. 30 tablet 0   ipratropium-albuterol (DUONEB) 0.5-2.5 (3) MG/3ML SOLN Take 3 mLs by nebulization every 6 (six) hours as needed for up to 7 days. 360 mL 0   Lactobacillus PACK Take 1 each by mouth as needed (for diarrhea.). 20 each 0   Lancets (ONETOUCH ULTRASOFT) lancets Use to check blood sugar 3 times daily with goal <130 fasting and <180 two hours after meal. 100 each 12   leflunomide (ARAVA) 20 MG tablet Take 20 mg by mouth daily.     levothyroxine (SYNTHROID) 50 MCG tablet Take 50 mcg by mouth daily before breakfast.      liothyronine (CYTOMEL) 5 MCG tablet Take 5 mcg by mouth daily.     losartan (COZAAR) 100 MG tablet Take 1 tablet (100 mg total) by mouth daily. 90 tablet 0   metFORMIN (GLUCOPHAGE) 500 MG tablet Take 1 tablet (500 mg total) by mouth 2 (two) times daily with a meal. 180 tablet 3   montelukast (SINGULAIR) 10 MG tablet Take 10 mg by mouth at bedtime.     nicotine (NICOTINE STEP 1) 21 mg/24hr patch Place 1 patch (21 mg total) onto the skin daily. 28 patch 0   ondansetron (ZOFRAN ODT) 4 MG disintegrating tablet Take 1 tablet (4 mg total) by mouth every 8 (eight) hours as needed for nausea or vomiting. 20 tablet 0   predniSONE (DELTASONE) 10 MG tablet Take 6 tablets by mouth daily for 2 days, then reduce by 1 tablet  every 2 days until gone 42 tablet 0   tiZANidine (ZANAFLEX) 4 MG tablet Take 1 tablet (4 mg total) by mouth every 8 (eight) hours as needed for muscle spasms. 90 tablet 0   No current facility-administered medications for this visit.    Allergies:   Azithromycin, Molds & smuts, Penicillins, and Shellfish allergy   Social History:  The patient  reports that she has been smoking cigarettes. She has a 34.00 pack-year smoking history. She has never used smokeless tobacco. She reports current alcohol use. She reports that she does not currently use drugs.   Family History:   family history includes Breast cancer (age of onset: 65) in her mother; Cancer in her maternal aunt; Diabetes in her maternal aunt, maternal grandmother, maternal uncle, and mother;  Hypertension in her father and maternal grandmother; Lung cancer in her mother; Ovarian cancer in her maternal aunt.    Review of Systems: Review of Systems  Constitutional: Negative.   HENT: Negative.    Respiratory:  Positive for cough, shortness of breath and wheezing.   Cardiovascular: Negative.   Gastrointestinal: Negative.   Musculoskeletal: Negative.   Neurological: Negative.   Psychiatric/Behavioral: Negative.    All other systems reviewed and are negative.   PHYSICAL EXAM: VS:  BP (!) 150/88 (BP Location: Left Arm, Patient Position: Sitting, Cuff Size: Normal)    Pulse 90    Ht _0  (1.6 m)    Wt 176 lb (79.8 kg)    SpO2 98%    BMI 31.18 kg/m  , BMI Body mass index is 31.18 kg/m. GEN: Well nourished, well developed, in no acute distress HEENT: normal Neck: no JVD, carotid bruits, or masses Cardiac: RRR; no murmurs, rubs, or gallops,no edema  Respiratory:  clear to auscultation bilaterally, normal work of breathing GI: soft, nontender, nondistended, + BS MS: no deformity or atrophy Skin: warm and dry, no rash Neuro:  Strength and sensation are intact Psych: euthymic mood, full affect   Recent Labs: 09/05/2021: ALT 22;  BUN 16; Creatinine, Ser 0.62; Hemoglobin 15.5; Platelets 341; Potassium 3.8; Sodium 141; TSH 2.730    Lipid Panel Lab Results  Component Value Date   CHOL 154 09/05/2021   HDL 72 09/05/2021   LDLCALC 47 09/05/2021   TRIG 226 (H) 09/05/2021      Wt Readings from Last 3 Encounters:  11/22/21 176 lb (79.8 kg)  11/09/21 177 lb (80.3 kg)  10/30/21 170 lb (77.1 kg)      ASSESSMENT AND PLAN:  Problem List Items Addressed This Visit       Cardiology Problems   Hypertension associated with diabetes ([place]) - Primary (Chronic)   Relevant Orders   EKG 12-Lead     Other   Rheumatoid arthritis involving multiple sites with positive rheumatoid factor (HCC)   Type 2 diabetes mellitus with obesity ([place])   Relevant Orders   EKG 12-Lead   Essential hypertension On amlodipine 10, HCTZ 12.5 daily with losartan 100 Recommend she increase HCTZ up to 25 daily, we will add clonidine 0.1 twice daily Recommend she continue to monitor blood pressure and call us with numbers Suspect some of her high pressures may be from coughing, pulmonary pathology, stress at home  Asthmatic bronchitis Critical situation at home with black mold growing, plumbing leak from above, there has little in terms of mold remediation, just fans and dehumidifier is likely blowing the dust and black mold around her house -Prednisone and inhalers not helping, very wheezy on exam -Reports black mold has recurred despite fairly aggressive cleaning agents on her behalf which sent her to urgent care secondary to bronchospasm -Black mold dripping, extending from the kitchen into living room Desperate situation given management at her apartment complexes not acting aggressively, rapidly.  Given effects on her health as well as her child who has worsening asthmatic bronchitis, recommend she discuss with the county health department , what her options are.  Certainly this would be grounds for breaking her lease and moving to a  different apartment.  Mold remediation would mean using plastic to seal off the mold area which would include her living room and kitchen.  Mold remediation team would be needed.  She cannot live in his apartment with her current pulmonary disease.  Diabetes type 2 Will be  difficult to control as she is on prednisone Management primary care  Preventive care Normal echocardiogram 2021 Excellent cholesterol, non-smoker   Total encounter time more than 60 minutes  Greater than 50% was spent in counseling and coordination of care with the patient    Signed, Esmond Plants, M.D., Ph.D. [place], [place]

## 2021-11-28 DIAGNOSIS — H04123 Dry eye syndrome of bilateral lacrimal glands: Secondary | ICD-10-CM | POA: Diagnosis not present

## 2021-11-28 DIAGNOSIS — H35033 Hypertensive retinopathy, bilateral: Secondary | ICD-10-CM | POA: Diagnosis not present

## 2021-11-28 DIAGNOSIS — E119 Type 2 diabetes mellitus without complications: Secondary | ICD-10-CM | POA: Diagnosis not present

## 2021-11-28 LAB — HM DIABETES EYE EXAM

## 2021-12-01 ENCOUNTER — Ambulatory Visit: Payer: Commercial Managed Care - HMO | Admitting: Nurse Practitioner

## 2021-12-01 DIAGNOSIS — E538 Deficiency of other specified B group vitamins: Secondary | ICD-10-CM

## 2021-12-01 DIAGNOSIS — E559 Vitamin D deficiency, unspecified: Secondary | ICD-10-CM

## 2021-12-01 DIAGNOSIS — E6609 Other obesity due to excess calories: Secondary | ICD-10-CM

## 2021-12-01 DIAGNOSIS — F320 Major depressive disorder, single episode, mild: Secondary | ICD-10-CM

## 2021-12-01 DIAGNOSIS — J432 Centrilobular emphysema: Secondary | ICD-10-CM

## 2021-12-01 DIAGNOSIS — E1169 Type 2 diabetes mellitus with other specified complication: Secondary | ICD-10-CM

## 2021-12-01 DIAGNOSIS — E1159 Type 2 diabetes mellitus with other circulatory complications: Secondary | ICD-10-CM

## 2021-12-01 DIAGNOSIS — E063 Autoimmune thyroiditis: Secondary | ICD-10-CM

## 2021-12-01 DIAGNOSIS — Z1159 Encounter for screening for other viral diseases: Secondary | ICD-10-CM

## 2021-12-01 DIAGNOSIS — M0579 Rheumatoid arthritis with rheumatoid factor of multiple sites without organ or systems involvement: Secondary | ICD-10-CM

## 2021-12-02 ENCOUNTER — Ambulatory Visit (INDEPENDENT_AMBULATORY_CARE_PROVIDER_SITE_OTHER): Payer: Medicaid Other | Admitting: Nurse Practitioner

## 2021-12-02 ENCOUNTER — Encounter: Payer: Self-pay | Admitting: Nurse Practitioner

## 2021-12-02 ENCOUNTER — Other Ambulatory Visit: Payer: Self-pay

## 2021-12-02 VITALS — BP 128/84 | HR 91 | Temp 98.3°F | Ht 63.0 in | Wt 174.4 lb

## 2021-12-02 DIAGNOSIS — Z6830 Body mass index (BMI) 30.0-30.9, adult: Secondary | ICD-10-CM

## 2021-12-02 DIAGNOSIS — E6609 Other obesity due to excess calories: Secondary | ICD-10-CM

## 2021-12-02 DIAGNOSIS — E559 Vitamin D deficiency, unspecified: Secondary | ICD-10-CM

## 2021-12-02 DIAGNOSIS — E1169 Type 2 diabetes mellitus with other specified complication: Secondary | ICD-10-CM

## 2021-12-02 DIAGNOSIS — M797 Fibromyalgia: Secondary | ICD-10-CM | POA: Diagnosis not present

## 2021-12-02 DIAGNOSIS — E063 Autoimmune thyroiditis: Secondary | ICD-10-CM

## 2021-12-02 DIAGNOSIS — Z1159 Encounter for screening for other viral diseases: Secondary | ICD-10-CM | POA: Diagnosis not present

## 2021-12-02 DIAGNOSIS — M0579 Rheumatoid arthritis with rheumatoid factor of multiple sites without organ or systems involvement: Secondary | ICD-10-CM

## 2021-12-02 DIAGNOSIS — E538 Deficiency of other specified B group vitamins: Secondary | ICD-10-CM

## 2021-12-02 DIAGNOSIS — E669 Obesity, unspecified: Secondary | ICD-10-CM

## 2021-12-02 DIAGNOSIS — J432 Centrilobular emphysema: Secondary | ICD-10-CM | POA: Diagnosis not present

## 2021-12-02 DIAGNOSIS — R229 Localized swelling, mass and lump, unspecified: Secondary | ICD-10-CM | POA: Diagnosis not present

## 2021-12-02 DIAGNOSIS — E1159 Type 2 diabetes mellitus with other circulatory complications: Secondary | ICD-10-CM | POA: Diagnosis not present

## 2021-12-02 DIAGNOSIS — I152 Hypertension secondary to endocrine disorders: Secondary | ICD-10-CM | POA: Diagnosis not present

## 2021-12-02 DIAGNOSIS — F320 Major depressive disorder, single episode, mild: Secondary | ICD-10-CM

## 2021-12-02 LAB — MICROALBUMIN, URINE WAIVED
Creatinine, Urine Waived: 200 mg/dL (ref 10–300)
Microalb, Ur Waived: 30 mg/L — ABNORMAL HIGH (ref 0–19)
Microalb/Creat Ratio: <30 mg/g (ref <30)

## 2021-12-02 LAB — BAYER DCA HB A1C WAIVED: HB A1C (BAYER DCA - WAIVED): 6.5 % — ABNORMAL HIGH (ref 4.8–5.6)

## 2021-12-02 MED ORDER — SITAGLIPTIN PHOSPHATE 100 MG PO TABS: 100.0000 mg | ORAL_TABLET | Freq: Every day | ORAL | 4 refills | Status: DC

## 2021-12-02 NOTE — Assessment & Plan Note (Signed)
Chronic, ongoing, followed by Dr. Honor Junes and last seen 08/05/20 -- has not returned since.  Will recheck TSH and Free T4 + thyroid antibody today.  At this time continue current medication regimen and adjust as needed.

## 2021-12-02 NOTE — Assessment & Plan Note (Signed)
Chronic, ongoing, followed by rheumatology.  Continue this collaboration and medication regimen as prescribed by them + Duloxetine as ordered by PCP for mood and pain.  Adjust regimen as needed.

## 2021-12-02 NOTE — Patient Instructions (Signed)

## 2021-12-02 NOTE — Assessment & Plan Note (Signed)
Chronic, ongoing.  Followed by rheumatology, continue this collaboration.  At this time continue current medications ordered by them and adjust as needed.  Continue taking Duloxetine, which is beneficial for pain and mood.

## 2021-12-02 NOTE — Assessment & Plan Note (Signed)
Diagnosed in April 2022, with A1c 6.5% today and urine ALB 30 (February 2023).  Does not tolerate Metformin due to GI issues.  Continue Farxiga 10 MG daily, stop Metformin and start Januvia 100 MG daily -- educated patient on this. To continue to check BS 2-3 times daily and document for visits.  To alert provider if poor tolerance of medication.  Consider statin in future, with underlying chronic pain would consider Rosuvastatin at low dose to start. Return in 3 months.

## 2021-12-02 NOTE — Assessment & Plan Note (Signed)
BMI 30.89.  Recommended eating smaller high protein, low fat meals more frequently and exercising 30 mins a day 5 times a week with a goal of 10-15lb weight loss in the next 3 months. Patient voiced their understanding and motivation to adhere to these recommendations.

## 2021-12-02 NOTE — Assessment & Plan Note (Signed)
Chronic, ongoing.  Continue current inhaler regimen and adjust as needed. Recommend to continue collaboration with pulmonary and instructed her to schedule follow-up with Dr. Raul Del. CBC today.

## 2021-12-02 NOTE — Assessment & Plan Note (Addendum)
Chronic, ongoing.  BP stable on recheck today.  Continue Losartan 100 MG daily and Amlodipine 10 MG daily.  Recommend she monitor BP at least a few mornings a week at home and document for visits.  DASH diet at home.  Labs: CBC, CMP, urine ALB, and lipid.  Return in 3 months.

## 2021-12-02 NOTE — Assessment & Plan Note (Signed)
Ongoing with PHQ9 = 5 today, much improvement.  Denies SI/HI.  Continue Duloxetine BID and Buspar as needed, which offers benefit to both mood and underlying fibromyalgia and chronic pain.  Refills as needed. Would benefit return to therapy in future, refuses today.

## 2021-12-02 NOTE — Progress Notes (Signed)
BP 128/84 (BP Location: Left Arm)    Pulse 91    Temp 98.3 F (36.8 C) (Oral)    Ht 5\' 3"  (1.6 m)    Wt 174 lb 6.4 oz (79.1 kg)    SpO2 95%    BMI 30.89 kg/m    Subjective:    Patient ID: Catherine Giles, female    DOB: 07-Jun-1971, 51 y.o.   MRN: [id number]  HPI: Catherine Giles is a 51 y.o. female  Chief Complaint  Patient presents with   Depression   Hypertension   Diabetes    Patient requested recent Diabetic Eye Exam.    DIABETES Continues on Farxiga 10 MG daily and Metformin 500 MG BID.  A1c in November 5.7%. Hypoglycemic episodes:no Polydipsia/polyuria: no Visual disturbance: no Chest pain: no Paresthesias: no Glucose Monitoring: yes  Accucheck frequency: not checking  Fasting glucose: not checking  Post prandial:  Evening:  Before meals: Taking Insulin?: no  Long acting insulin:  Short acting insulin: Blood Pressure Monitoring: daily Retinal Examination: Up to Date -- Dr. Hall Busing Exam: Up to Date Pneumovax: Not Up To Date Influenza: Not up to Date Aspirin: no   HYPERTENSION / HYPERLIPIDEMIA Continues on Losartan, Clonidine, HCTZ, and Amlodipine, no statin at this time = reports she has never tried. Satisfied with current treatment? yes Duration of hypertension: chronic BP monitoring frequency: daily BP range: occasional elevations BP medication side effects: no Past BP meds: as above Duration of hyperlipidemia: chronic Medication compliance: good compliance Aspirin: no Recent stressors: no Recurrent headaches: no Visual changes: no Palpitations: no Dyspnea: yes Chest pain: no Lower extremity edema: no Dizzy/lightheaded: no   HYPOTHYROIDISM Continues on Levothyroxine 50 MCG daily. Thyroid control status:stable Satisfied with current treatment? yes Medication side effects: no Medication compliance: good compliance Etiology of hypothyroidism:  Recent dose adjustment:no Fatigue: yes Cold intolerance: no Heat intolerance: no Weight gain:  no Weight loss: no Constipation: no Diarrhea/loose stools: yes Palpitations: no Lower extremity edema: no Anxiety/depressed mood: yes   COPD Continues on Trelegy and Albuterol as needed.  Followed by Dr. Raul Del. COPD status: stable Satisfied with current treatment?: yes Oxygen use: no Dyspnea frequency:  Cough frequency:  Rescue inhaler frequency:   Limitation of activity: no Productive cough:  Last Spirometry:  Pneumovax: Not up to Date Influenza: Not up to Date -- made her sick  DEPRESSION Continues on Duloxetine 30 MG BID and Buspar 5 MG BID. Mood status: stable Satisfied with current treatment?: yes Symptom severity: moderate  Duration of current treatment : chronic Side effects: no Medication compliance: good compliance Psychotherapy/counseling: none Depressed mood: yes Anxious mood: no Anhedonia: no Significant weight loss or gain: no Insomnia: none Fatigue: no Feelings of worthlessness or guilt: no Impaired concentration/indecisiveness: no Suicidal ideations: no Hopelessness: no Crying spells: no Depression screen [facility] 2/9 12/02/2021 11/09/2021 09/05/2021 06/02/2021 04/15/2021  Decreased Interest 1 1 1 3 2   Down, Depressed, Hopeless 0 0 1 3 2   PHQ - 2 Score 1 1 2 6 4   Altered sleeping 2 1 2 3 3   Tired, decreased energy 1 1 2 3 3   Change in appetite 0 0 2 3 3   Feeling bad or failure about yourself  0 0 2 3 0  Trouble concentrating 0 1 2 3 1   Moving slowly or fidgety/restless 1 1 1 2  0  Suicidal thoughts 0 - 0 0 0  PHQ-9 Score 5 5 13 23 14   Difficult doing work/chores - Extremely dIfficult Not difficult at all Very  difficult Somewhat difficult  Some recent data might be hidden    GAD 7 : Generalized Anxiety Score 12/02/2021 11/09/2021 09/05/2021 06/02/2021  Nervous, Anxious, on Edge 2 1 2 3   Control/stop worrying 1 1 2 3   Worry too much - different things 1 1 2 3   Trouble relaxing - 0 2 3  Restless 2 0 1 2  Easily annoyed or irritable 0 2 2 3   Afraid -  awful might happen 0 2 0 3  Total GAD 7 Score - 7 11 20   Anxiety Difficulty Somewhat difficult Extremely difficult Not difficult at all Very difficult   CHRONIC PAIN WITH RA Followed by rheumatology for RA with last visit 09/13/21-- continues infusions (Golimumab) + Arava.    She reports at this time she has a firm, round area to RUQ abdomen that she feels from time to time and is tender.  Reports it moves and is small.  Would like assessed. Pain control status: stable Duration: chronic Location: arms and hands are the worst -- legs Quality: dull, aching, burning and throbbing Current Pain Level: 6/10 Previous Pain Level: 10/10 What Activities task can be accomplished with current medication? House work at times but can not work Previous pain specialty evaluation: no Non-narcotic analgesic meds: yes   Relevant past medical, surgical, family and social history reviewed and updated as indicated. Interim medical history since our last visit reviewed. Allergies and medications reviewed and updated.  Review of Systems  Constitutional:  Negative for activity change, appetite change, diaphoresis, fatigue and fever.  Respiratory:  Negative for cough, chest tightness, shortness of breath and wheezing.   Cardiovascular:  Negative for chest pain, palpitations and leg swelling.  Endocrine: Negative for cold intolerance, heat intolerance, polydipsia, polyphagia and polyuria.  Musculoskeletal:  Positive for arthralgias.  Neurological: Negative.   Psychiatric/Behavioral:  Negative for decreased concentration, self-injury, sleep disturbance and suicidal ideas. The patient is nervous/anxious.    Per HPI unless specifically indicated above     Objective:    BP 128/84 (BP Location: Left Arm)    Pulse 91    Temp 98.3 F (36.8 C) (Oral)    Ht 5\' 3"  (1.6 m)    Wt 174 lb 6.4 oz (79.1 kg)    SpO2 95%    BMI 30.89 kg/m   Wt Readings from Last 3 Encounters:  12/02/21 174 lb 6.4 oz (79.1 kg)  11/22/21  176 lb (79.8 kg)  11/09/21 177 lb (80.3 kg)    Physical Exam Vitals and nursing note reviewed.  Constitutional:      General: She is awake. She is not in acute distress.    Appearance: She is well-developed and well-groomed. She is obese. She is not ill-appearing or toxic-appearing.  HENT:     Head: Normocephalic.     Right Ear: Hearing normal.     Left Ear: Hearing normal.  Eyes:     General: Lids are normal.        Right eye: No discharge.        Left eye: No discharge.     Conjunctiva/sclera: Conjunctivae normal.     Pupils: Pupils are equal, round, and reactive to light.  Neck:     Thyroid: No thyromegaly.     Vascular: No carotid bruit.  Cardiovascular:     Rate and Rhythm: Normal rate and regular rhythm.     Heart sounds: Normal heart sounds. No murmur heard.   No gallop.  Pulmonary:     Effort: Pulmonary effort  is normal. No accessory muscle usage or respiratory distress.     Breath sounds: Normal breath sounds.  Abdominal:     General: Bowel sounds are normal. There is no distension.     Palpations: Abdomen is soft. There is no mass.     Tenderness: There is no abdominal tenderness. There is no right CVA tenderness or left CVA tenderness.     Comments: No cystic like area appreciated on exam.  Musculoskeletal:     Cervical back: Normal range of motion and neck supple.     Right lower leg: No edema.     Left lower leg: No edema.  Skin:    General: Skin is warm and dry.  Neurological:     Mental Status: She is alert and oriented to person, place, and time.  Psychiatric:        Attention and Perception: Attention normal.        Mood and Affect: Mood normal.        Speech: Speech normal.        Behavior: Behavior normal. Behavior is cooperative.        Thought Content: Thought content normal.   Results for orders placed or performed in visit on 12/02/21  Bayer DCA Hb A1c Waived  Result Value Ref Range   HB A1C (BAYER DCA - WAIVED) 6.5 (H) 4.8 - 5.6 %   Microalbumin, Urine Waived  Result Value Ref Range   Microalb, Ur Waived 30 (H) 0 - 19 mg/L   Creatinine, Urine Waived 200 10 - 300 mg/dL   Microalb/Creat Ratio <30 <30 mg/g      Assessment & Plan:   Problem List Items Addressed This Visit       Cardiovascular and Mediastinum   Hypertension associated with diabetes (HCC) (Chronic)    Chronic, ongoing.  BP stable on recheck today.  Continue Losartan 100 MG daily and Amlodipine 10 MG daily.  Recommend she monitor BP at least a few mornings a week at home and document for visits.  DASH diet at home.  Labs: CBC, CMP, urine ALB, and lipid.  Return in 3 months.       Relevant Medications   sitaGLIPtin (JANUVIA) 100 MG tablet   Other Relevant Orders   Bayer DCA Hb A1c Waived (Completed)     Respiratory   Centrilobular emphysema (HCC)    Chronic, ongoing.  Continue current inhaler regimen and adjust as needed. Recommend to continue collaboration with pulmonary and instructed her to schedule follow-up with Dr. Raul Del. CBC today.      Relevant Orders   CBC with Differential/Platelet     Endocrine   Hashimoto's thyroiditis    Chronic, ongoing, followed by Dr. Honor Junes and last seen 08/05/20 -- has not returned since.  Will recheck TSH and Free T4 + thyroid antibody today.  At this time continue current medication regimen and adjust as needed.        Relevant Orders   TSH   T4, free   Type 2 diabetes mellitus with obesity ([place]) - Primary    Diagnosed in April 2022, with A1c 6.5% today and urine ALB 30 (February 2023).  Does not tolerate Metformin due to GI issues.  Continue Farxiga 10 MG daily, stop Metformin and start Januvia 100 MG daily -- educated patient on this. To continue to check BS 2-3 times daily and document for visits.  To alert provider if poor tolerance of medication.  Consider statin in future, with underlying chronic pain would  consider Rosuvastatin at low dose to start. Return in 3 months.      Relevant  Medications   sitaGLIPtin (JANUVIA) 100 MG tablet   Other Relevant Orders   Bayer DCA Hb A1c Waived (Completed)   Microalbumin, Urine Waived (Completed)   CBC with Differential/Platelet   Comprehensive metabolic panel   Lipid Panel w/o Chol/HDL Ratio     Musculoskeletal and Integument   Rheumatoid arthritis involving multiple sites with positive rheumatoid factor (HCC)    Chronic, ongoing.  Followed by rheumatology, continue this collaboration.  At this time continue current medications ordered by them and adjust as needed.  Continue taking Duloxetine, which is beneficial for pain and mood.         Other   Depression, major, single episode, mild ([place])    Ongoing with PHQ9 = 5 today, much improvement.  Denies SI/HI.  Continue Duloxetine BID and Buspar as needed, which offers benefit to both mood and underlying fibromyalgia and chronic pain.  Refills as needed. Would benefit return to therapy in future, refuses today.      Fibromyalgia    Chronic, ongoing, followed by rheumatology.  Continue this collaboration and medication regimen as prescribed by them + Duloxetine as ordered by PCP for mood and pain.  Adjust regimen as needed.      Obesity    BMI 30.89.  Recommended eating smaller high protein, low fat meals more frequently and exercising 30 mins a day 5 times a week with a goal of 10-15lb weight loss in the next 3 months. Patient voiced their understanding and motivation to adhere to these recommendations.       Relevant Medications   sitaGLIPtin (JANUVIA) 100 MG tablet   Vitamin D deficiency   Relevant Orders   VITAMIN D 25 Hydroxy (Vit-D Deficiency, Fractures)   Other Visit Diagnoses     Skin mass       Will obtain ultrasound RUQ, no mass noted on exam today.   Relevant Orders   US Abdomen Limited RUQ (LIVER/GB)   Vitamin B12 deficiency       History of low levels, check today and start supplement as needed.   Relevant Orders   Vitamin B12   Need for hepatitis C  screening test       Hep C screening on labs today, discussed with patient.   Relevant Orders   Hepatitis C antibody        Follow up plan: Return in about 3 months (around 03/01/2022) for T2DM, HTN/HLD, RA, MOOD.

## 2021-12-03 LAB — LIPID PANEL W/O CHOL/HDL RATIO
Cholesterol, Total: 156 mg/dL (ref 100–199)
HDL: 73 mg/dL (ref >39)
LDL Chol Calc (NIH): 66 mg/dL (ref 0–99)
Triglycerides: 94 mg/dL (ref 0–149)
VLDL Cholesterol Cal: 17 mg/dL (ref 5–40)

## 2021-12-03 LAB — COMPREHENSIVE METABOLIC PANEL
ALT: 21 IU/L (ref 0–32)
AST: 15 IU/L (ref 0–40)
Albumin/Globulin Ratio: 1.9 (ref 1.2–2.2)
Albumin: 4.5 g/dL (ref 3.8–4.9)
Alkaline Phosphatase: 74 IU/L (ref 44–121)
BUN/Creatinine Ratio: 23 (ref 9–23)
BUN: 11 mg/dL (ref 6–24)
Bilirubin Total: 0.4 mg/dL (ref 0.0–1.2)
CO2: 20 mmol/L (ref 20–29)
Calcium: 9.5 mg/dL (ref 8.7–10.2)
Chloride: 105 mmol/L (ref 96–106)
Creatinine, Ser: 0.48 mg/dL — ABNORMAL LOW (ref 0.57–1.00)
Globulin, Total: 2.4 g/dL (ref 1.5–4.5)
Glucose: 123 mg/dL — ABNORMAL HIGH (ref 70–99)
Potassium: 4 mmol/L (ref 3.5–5.2)
Sodium: 145 mmol/L — ABNORMAL HIGH (ref 134–144)
Total Protein: 6.9 g/dL (ref 6.0–8.5)
eGFR: 114 mL/min/{1.73_m2} (ref >59)

## 2021-12-03 LAB — CBC WITH DIFFERENTIAL/PLATELET
Basophils Absolute: 0.1 10*3/uL (ref 0.0–0.2)
Basos: 1 %
EOS (ABSOLUTE): 0.1 10*3/uL (ref 0.0–0.4)
Eos: 1 %
Hematocrit: 46.2 % (ref 34.0–46.6)
Hemoglobin: 15.2 g/dL (ref 11.1–15.9)
Immature Grans (Abs): 0 10*3/uL (ref 0.0–0.1)
Immature Granulocytes: 0 %
Lymphocytes Absolute: 5.3 10*3/uL — ABNORMAL HIGH (ref 0.7–3.1)
Lymphs: 53 %
MCH: 31.1 pg (ref 26.6–33.0)
MCHC: 32.9 g/dL (ref 31.5–35.7)
MCV: 95 fL (ref 79–97)
Monocytes Absolute: 0.6 10*3/uL (ref 0.1–0.9)
Monocytes: 7 %
Neutrophils Absolute: 3.7 10*3/uL (ref 1.4–7.0)
Neutrophils: 38 %
Platelets: 380 10*3/uL (ref 150–450)
RBC: 4.89 x10E6/uL (ref 3.77–5.28)
RDW: 12.3 % (ref 11.7–15.4)
WBC: 9.8 10*3/uL (ref 3.4–10.8)

## 2021-12-03 LAB — T4, FREE: Free T4: 1.19 ng/dL (ref 0.82–1.77)

## 2021-12-03 LAB — VITAMIN D 25 HYDROXY (VIT D DEFICIENCY, FRACTURES): Vit D, 25-Hydroxy: 26.7 ng/mL — ABNORMAL LOW (ref 30.0–100.0)

## 2021-12-03 LAB — TSH: TSH: 1.52 u[IU]/mL (ref 0.450–4.500)

## 2021-12-03 LAB — VITAMIN B12: Vitamin B-12: 624 pg/mL (ref 232–1245)

## 2021-12-03 LAB — HEPATITIS C ANTIBODY: Hep C Virus Ab: <0.1 s/co ratio (ref 0.0–0.9)

## 2021-12-03 NOTE — Progress Notes (Signed)
Contacted via MyChart   Good morning Catherine Giles, your labs have returned with exception of Hep C screening which is still pending.  I will let you know if any issues with this.   - Kidney function, creatinine and eGFR, remains normal, as is liver function, AST and ALT.   - Sodium, salt level, as little high -- please drink more water and eat a little less salt we will recheck next visit. - Vitamin D level remains low, please continue Vitamin D3 2000 units daily for bone and muscle health. - Remainder of labs are stable, no medication changes needed.  Any questions? Keep being amazing!!  Thank you for allowing me to participate in your care.  I appreciate you. Kindest regards, Quayshawn Nin

## 2021-12-08 ENCOUNTER — Ambulatory Visit: Admission: RE | Admit: 2021-12-08 | Payer: Medicare Other | Source: Ambulatory Visit

## 2021-12-09 ENCOUNTER — Telehealth: Payer: Self-pay | Admitting: Nurse Practitioner

## 2021-12-09 NOTE — Telephone Encounter (Signed)
Noted  

## 2021-12-09 NOTE — Telephone Encounter (Signed)
Copied from [place] [id number]. Topic: Appointment Scheduling - Scheduling Inquiry for Clinic >> Dec 09, 2021  8:26 AM Oneta Rack wrote: Patient wanted to inform PCP she received a voicemail with a date and time of her imaging appointment and did not check her appointment until today and the appointment was yesterday. Patient will contact imaging department to [facility] but wanted to make PCP aware.

## 2021-12-13 ENCOUNTER — Telehealth: Payer: Self-pay | Admitting: Nurse Practitioner

## 2021-12-13 DIAGNOSIS — E1169 Type 2 diabetes mellitus with other specified complication: Secondary | ICD-10-CM

## 2021-12-13 DIAGNOSIS — E063 Autoimmune thyroiditis: Secondary | ICD-10-CM

## 2021-12-13 NOTE — Telephone Encounter (Signed)
Referral Request - Has patient seen PCP for this complaint? Yes.   *If NO, is insurance requiring patient see PCP for this issue before PCP can refer them? Referral for which specialty: endocrinology Preferred provider/office: Valentina Gu Phone [phone]  option 5 Fax:  [phone]  Reason for referral: pt states she no longer goes to [facility] [facility]. She would like to go to see this person.

## 2021-12-14 ENCOUNTER — Telehealth: Payer: Medicare Other

## 2021-12-15 NOTE — Telephone Encounter (Signed)
Called and LVM notifying patient that a new referral has been placed for her as requested. Asked for patient to call back with any questions or concerns.

## 2021-12-22 ENCOUNTER — Other Ambulatory Visit: Payer: Self-pay | Admitting: Nurse Practitioner

## 2021-12-22 NOTE — Telephone Encounter (Signed)
Requested Prescriptions  ?Pending Prescriptions Disp Refills  ?? hydrOXYzine (VISTARIL) 25 MG capsule [Pharmacy Med Name: HYDROXYZINE PAMOATE 25 MG CAP] 60 capsule 0  ?  Sig: TAKE 1 CAPSULE BY MOUTH EVERY 8 HOURS ASNEEDED  ?  ? Ear, Nose, and Throat:  Antihistamines 2 Failed - 12/22/2021 12:32 PM  ?  ?  Failed - Cr in normal range and within 360 days  ?  Creatinine, Ser  ?Date Value Ref Range Status  ?12/02/2021 0.48 (L) 0.57 - 1.00 mg/dL Final  ?   ?  ?  Passed - Valid encounter within last 12 months  ?  Recent Outpatient Visits   ?      ? 2 weeks ago Type 2 diabetes mellitus with obesity ([place])  ? [place], [place] T, NP  ? 1 month ago Primary hypertension  ? [name] Vigg, [place], MD  ? 1 month ago Rheumatoid arthritis involving multiple sites with positive rheumatoid factor ([place])  ? [place], [place] T, NP  ? 3 months ago Type 2 diabetes mellitus with obesity ([place])  ? [place], [place] T, NP  ? 4 months ago Upper respiratory tract infection, unspecified type  ? [facility], Scheryl Darter, NP  ?  ?  ?Future Appointments   ?        ? In 2 months [place], Barbaraann Faster, NP [employer], [facility]  ? In 3 months  [place]  ?  ? ?  ?  ?  ? ? ?

## 2021-12-27 ENCOUNTER — Ambulatory Visit: Payer: Medicare Other

## 2022-01-02 ENCOUNTER — Encounter: Payer: Self-pay | Admitting: Nurse Practitioner

## 2022-01-03 ENCOUNTER — Ambulatory Visit (INDEPENDENT_AMBULATORY_CARE_PROVIDER_SITE_OTHER): Payer: Medicare Other | Admitting: Unknown Physician Specialty

## 2022-01-03 ENCOUNTER — Other Ambulatory Visit: Payer: Self-pay

## 2022-01-03 ENCOUNTER — Encounter: Payer: Self-pay | Admitting: Nurse Practitioner

## 2022-01-03 ENCOUNTER — Ambulatory Visit: Payer: Self-pay | Admitting: *Deleted

## 2022-01-03 ENCOUNTER — Encounter: Payer: Self-pay | Admitting: Unknown Physician Specialty

## 2022-01-03 VITALS — BP 178/91 | HR 83 | Temp 98.9°F | Wt 176.6 lb

## 2022-01-03 DIAGNOSIS — Z79899 Other long term (current) drug therapy: Secondary | ICD-10-CM

## 2022-01-03 DIAGNOSIS — L509 Urticaria, unspecified: Secondary | ICD-10-CM

## 2022-01-03 DIAGNOSIS — M0579 Rheumatoid arthritis with rheumatoid factor of multiple sites without organ or systems involvement: Secondary | ICD-10-CM | POA: Diagnosis not present

## 2022-01-03 MED ORDER — HYDROXYZINE PAMOATE 25 MG PO CAPS: ORAL_CAPSULE | ORAL | 0 refills | Status: AC

## 2022-01-03 MED ORDER — PREDNISONE 10 MG PO TABS
ORAL_TABLET | ORAL | 0 refills | Status: DC
Start: 2022-01-03 — End: 2022-06-23

## 2022-01-03 MED ORDER — CETIRIZINE HCL 10 MG PO TABS: 10.0000 mg | ORAL_TABLET | Freq: Every day | ORAL | 11 refills | Status: DC

## 2022-01-03 NOTE — Assessment & Plan Note (Signed)
Pt on multiple medications which may be contributing to urticaria.  Will refer to clinical pharmacist for recommendations of what she can eliminate and useful treatment for intermittent urticaria which does not involve steroids ?

## 2022-01-03 NOTE — Patient Instructions (Signed)
Stop Fexofenidine (Allegra) ? ?Start Cetirizine (Zyrtec) ? ?I will check you for Celiac and Alpha gal (rash from tick bite) ? ?Take Prednisone as directed on bottle ? ? ? ?

## 2022-01-03 NOTE — Telephone Encounter (Signed)
?  Chief Complaint: Facial swelling ?Symptoms: Blotchy rash ?Frequency: Swelling onset yesterday, hives on body, not face, blotchy rash on face. Saw Catherine Giles in past for issue. ?Pertinent Negatives: Patient denies no difficulty swallowing, no SOB. ?Disposition: '[]'$ ED /'[]'$ Urgent Care (no appt availability in office) / '[x]'$ Appointment(In office/virtual)/ '[]'$  [place] [facility]/ '[]'$ Home Care/ '[]'$ Refused Recommended Disposition /'[]'$ [facility]/ '[]'$  Follow-up with PCP ?Additional Notes:Advised EMS for any sign of difficulty swallowing, SOB. Verbalizes understanding.  Reason for Disposition ? [1] MODERATE-SEVERE hives persist (i.e., hives interfere with normal activities or work) AND [2] taking antihistamine (e.g., Benadryl, Claritin) > 24 hours ? ?Answer Assessment - Initial Assessment Questions ?1. APPEARANCE: "What does the rash look like?"  ?    "Hives" ?2. LOCATION: "Where is the rash located?"  ?    Swelling of face ?3. NUMBER: "How many hives are there?"  ?     ?4. SIZE: "How big are the hives?" (inches, cm, compare to coins) "Do they all look the same or is there lots of variation in shape and size?"  ?    Blotchy ?5. ONSET: "When did the hives begin?" (Hours or days ago)  ?     ?6. ITCHING: "Does it itch?" If Yes, ask: "How bad is the itch?"  ?  - MILD: doesn't interfere with normal activities ?  - MODERATE-SEVERE: interferes with work, school, sleep, or other activities  ?    yes ?7. RECURRENT PROBLEM: "Have you had hives before?" If Yes, ask: "When was the last time?" and "What happened that time?"  ?    Saw PCP ?8. TRIGGERS: "Were you exposed to any new food, plant, cosmetic product or animal just before the hives began?" ?    no ?9. OTHER SYMPTOMS: "Do you have any other symptoms?" (e.g., fever, tongue swelling, difficulty breathing, abdominal pain) ?    no ? ?Protocols used: Hives-A-AH ? ?
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
## 2022-01-06 LAB — TISSUE TRANSGLUTAMINASE ABS,IGG,IGA
Tissue Transglut Ab: <2 U/mL (ref 0–5)
Transglutaminase IgA: <2 U/mL (ref 0–3)

## 2022-01-06 LAB — CBC WITH DIFFERENTIAL/PLATELET
Basophils Absolute: 0.1 10*3/uL (ref 0.0–0.2)
Basos: 0 %
EOS (ABSOLUTE): 0 10*3/uL (ref 0.0–0.4)
Eos: 0 %
Hematocrit: 44.1 % (ref 34.0–46.6)
Hemoglobin: 14.7 g/dL (ref 11.1–15.9)
Immature Grans (Abs): 0 10*3/uL (ref 0.0–0.1)
Immature Granulocytes: 0 %
Lymphocytes Absolute: 5.8 10*3/uL — ABNORMAL HIGH (ref 0.7–3.1)
Lymphs: 50 %
MCH: 31.6 pg (ref 26.6–33.0)
MCHC: 33.3 g/dL (ref 31.5–35.7)
MCV: 95 fL (ref 79–97)
Monocytes Absolute: 0.6 10*3/uL (ref 0.1–0.9)
Monocytes: 5 %
Neutrophils Absolute: 5.3 10*3/uL (ref 1.4–7.0)
Neutrophils: 45 %
Platelets: 369 10*3/uL (ref 150–450)
RBC: 4.65 x10E6/uL (ref 3.77–5.28)
RDW: 12.3 % (ref 11.7–15.4)
WBC: 11.8 10*3/uL — ABNORMAL HIGH (ref 3.4–10.8)

## 2022-01-06 LAB — ALPHA-GAL PANEL
Allergen Lamb IgE: 0.14 kU/L — AB
Beef IgE: 0.15 kU/L — AB
IgE (Immunoglobulin E), Serum: 3206 IU/mL — ABNORMAL HIGH (ref 6–495)
O215-IgE Alpha-Gal: <0.1 kU/L
Pork IgE: <0.1 kU/L

## 2022-01-07 ENCOUNTER — Encounter: Payer: Self-pay | Admitting: Nurse Practitioner

## 2022-01-07 DIAGNOSIS — Z91018 Allergy to other foods: Secondary | ICD-10-CM | POA: Insufficient documentation

## 2022-01-09 ENCOUNTER — Telehealth: Payer: Medicare Other

## 2022-01-09 ENCOUNTER — Telehealth: Payer: Self-pay

## 2022-01-09 NOTE — Chronic Care Management (AMB) (Signed)
? ? ?Chronic Care Management ?Pharmacy Assistant  ? ?Name: Catherine Giles  MRN: [id number] DOB: 05-Jan-1971 ? ?Catherine Giles is an 51 y.o. year old female who presents for his initial CCM visit with the clinical pharmacist. ? ? ?Recent office visits:  ?01/03/22-Cheryl Julian Hy, NP. Seen for urticaria. AMB Referral to [facility]. Labs ordered. Stop allegra, start on Cetirizine. Start prednisone as directed. Return if symptoms worsen or fail to improve. ?12/02/21-Jolene T. Ned Card, NP (PCP) General follow up visit. Labs ordered. Stop Metformin and start Januvia 100 MG daily. Abdominal ultrasound ordered. Follow up in 3 months. ?11/09/21-Avanti vigg, MD. Seen for hypertension and fatigue. Follow up in 1 week. ?10/25/21-Jolene T. Ned Card, NP (PCP) Seen for a rash. triamcinolone acetonide (KENALOG-40) injection 40 mg given in the office. Start on predniSONE (DELTASONE) 10 MG tablet. Follow up in 1 week. ?09/05/21-Jolene T. Cannady,NP (PCP) Seen for general follow up visit. Increase  Amlodipine to 10 MG daily. Labs ordered. Ambulatory referral to Cardiology. Follow up in 4 weeks. ?08/16/21-Lauren McElwee, NP. Seen for hypertension and URI. Start on doxycycline (VIBRA-TABS) 100 MG tablet and ondansetron (ZOFRAN ODT) 4 MG disintegrating tablet. Return if symptoms worsen or fail to improve.  ? ?Recent consult visits:  ?01/03/22-Mayur Stefan Church, MD (Rheumatology) Notes not available.  ?11/22/21-Timothy Gloriajean Dell, MD (Cardiology) Initial visit. EKG ordered. Increase Hydrochlorothiazide up to 25 mg daily. Start clonidine 0.1 twice daily. Follow up prn. ?11/08/21-Herbon E. Raul Del, MD (Pulmonology) Seen for dyspnea with activity. ?09/13/22-Mayur Stefan Church, MD (Rheumatology) Seen for medication refill. Restart Leflunomide 20 mg daily. Follow up in 4 months. ?07/20/21-Mayur Stefan Church, MD (Rheumatology) Notes not available. ? ?Hospital visits:  ?None in previous 6 months ?Only urgent care visits no ED  visits. ? ?Medications: ?Outpatient Encounter Medications as of 01/09/2022  ?Medication Sig  ? albuterol (PROVENTIL) (2.5 MG/3ML) 0.083% nebulizer solution Take 3 mLs (2.5 mg total) by nebulization every 6 (six) hours as needed for wheezing or shortness of breath.  ? albuterol (VENTOLIN HFA) 108 (90 Base) MCG/ACT inhaler Inhale 2 puffs into the lungs every 4 (four) hours as needed.   ? amLODipine (NORVASC) 10 MG tablet Take 1 tablet (10 mg total) by mouth daily.  ? aspirin 81 MG EC tablet Take 1 tablet (81 mg total) by mouth daily. Swallow whole.  ? Blood Glucose Monitoring Suppl (ONETOUCH VERIO) w/Device KIT Use to check blood sugar 3 times daily with goal <130 fasting and <180 two hours after meal.  Bring meter to visits.  ? busPIRone (BUSPAR) 5 MG tablet Take 1 tablet (5 mg total) by mouth 2 (two) times daily.  ? cetirizine (ZYRTEC) 10 MG tablet Take 1 tablet (10 mg total) by mouth daily.  ? cloNIDine (CATAPRES) 0.1 MG tablet Take 1 tablet (0.1 mg total) by mouth 2 (two) times daily.  ? cyproheptadine (PERIACTIN) 4 MG tablet Take 1 tablet (4 mg total) by mouth at bedtime. (Patient not taking: Reported on 01/03/2022)  ? dapagliflozin propanediol (FARXIGA) 10 MG TABS tablet Take 1 tablet (10 mg total) by mouth daily before breakfast.  ? DULoxetine (CYMBALTA) 30 MG capsule Take 30 mg by mouth 2 (two) times daily.  ? fluticasone (FLONASE) 50 MCG/ACT nasal spray Place 2 sprays into both nostrils daily.  ? Fluticasone-Umeclidin-Vilant (TRELEGY ELLIPTA) 100-62.5-25 MCG/INH AEPB Inhale 1 puff into the lungs daily.  ? glucose blood test strip Use to check blood sugar 3 times daily with goal <130 fasting and <180 two hours after meal.  ? golimumab ([place])  50 MG/4ML SOLN injection Inject into the vein.  ? hydrochlorothiazide (HYDRODIURIL) 25 MG tablet Take 1 tablet (25 mg total) by mouth daily.  ? hydrOXYzine (VISTARIL) 25 MG capsule TAKE 1 CAPSULE BY MOUTH EVERY 8 HOURS ASNEEDED  ? ibuprofen (ADVIL) 400 MG tablet Take  1 tablet (400 mg total) by mouth every 8 (eight) hours as needed.  ? ipratropium-albuterol (DUONEB) 0.5-2.5 (3) MG/3ML SOLN Take 3 mLs by nebulization every 6 (six) hours as needed for up to 7 days.  ? Lactobacillus PACK Take 1 each by mouth as needed (for diarrhea.).  ? Lancets (ONETOUCH ULTRASOFT) lancets Use to check blood sugar 3 times daily with goal <130 fasting and <180 two hours after meal.  ? leflunomide (ARAVA) 20 MG tablet Take 20 mg by mouth daily.  ? levothyroxine (SYNTHROID) 50 MCG tablet Take 50 mcg by mouth daily before breakfast.   ? liothyronine (CYTOMEL) 5 MCG tablet Take 5 mcg by mouth daily.  ? losartan (COZAAR) 100 MG tablet Take 1 tablet (100 mg total) by mouth daily.  ? predniSONE (DELTASONE) 10 MG tablet Take 6 tablets by mouth daily for 2 days, then reduce by 1 tablet every 2 days until gone  ? sitaGLIPtin (JANUVIA) 100 MG tablet Take 1 tablet (100 mg total) by mouth daily.  ? tiZANidine (ZANAFLEX) 4 MG tablet Take 1 tablet (4 mg total) by mouth every 8 (eight) hours as needed for muscle spasms.  ? [DISCONTINUED] traZODone (DESYREL) 50 MG tablet Take 1 tablet (50 mg total) by mouth at bedtime.  ? ?No facility-administered encounter medications on file as of 01/09/2022.  ? ?albuterol (PROVENTIL) (2.5 MG/3ML) 0.083% nebulizer solution Last filled:12/22/21 8 DS ?amLODipine (NORVASC) 10 MG tablet Last filled:12/05/21 90 DS ?aspirin 81 MG EC tablet Last filled:None noted ?busPIRone (BUSPAR) 5 MG tablet Last filled:12/05/21 90 DS ?cetirizine (ZYRTEC) 10 MG tablet Last filled:None noted ?cloNIDine (CATAPRES) 0.1 MG tablet Last filled:11/22/21 90 DS ?cyproheptadine (PERIACTIN) 4 MG tablet Last filled:12/22/21 10 DS ?dapagliflozin propanediol (FARXIGA) 10 MG TABS tablet Last filled:10/25/21 90 DS ?DULoxetine (CYMBALTA) 30 MG capsule Last filled:10/25/21 90 DS ?fluticasone (FLONASE) 50 MCG/ACT nasal spray Last filled:07/01/20 30 DS ?Fluticasone-Umeclidin-Vilant (TRELEGY ELLIPTA) 100-62.5-25 MCG/INH  AEPB Last filled:12/22/21 30 DS ?golimumab (SIMPONI ARIA) 50 MG/4ML SOLN injection Last filled:None noted ?hydrochlorothiazide (HYDRODIURIL) 25 MG tablet Last filled:11/22/21 90 DS ?hydrOXYzine (VISTARIL) 25 MG capsule Last filled:01/03/22 20 DS ?ibuprofen (ADVIL) 400 MG tablet Last filled:12/22/21 60 DS ?ipratropium-albuterol (DUONEB) 0.5-2.5 (3) MG/3ML SOLN (Expired) Last filled:10/31/21 30 DS ?leflunomide (ARAVA) 20 MG tablet Last filled:12/22/21 30 DS ?levothyroxine (SYNTHROID) 50 MCG tablet Last filled:09/12/21 90 DS ?liothyronine (CYTOMEL) 5 MCG tablet Last filled:09/29/20 30 DS ?losartan (COZAAR) 100 MG tablet Last filled:06/02/21 90 DS ?predniSONE (DELTASONE) 10 MG tablet Last filled:01/03/22 12 DS ?sitaGLIPtin (JANUVIA) 100 MG tablet Last filled:12/02/21 90 DS ?tiZANidine (ZANAFLEX) 4 MG tablet Last filled:08/23/20 30 DS ? ? ?Care Gaps: ?OPHTHALMOLOGY EXAM:Never done ? ?Star Rating Drugs: ?dapagliflozin propanediol (FARXIGA) 10 MG TABS tablet Last filled:10/25/21 90 DS ?losartan (COZAAR) 100 MG tablet Last filled:06/02/21 90 DS ?sitaGLIPtin (JANUVIA) 100 MG tablet Last filled:12/02/21 90 DS ? ?Corrie Mckusick, RMA ?Health Concierge ? ?

## 2022-01-09 NOTE — Telephone Encounter (Signed)
?  Care Management  ? ?Follow Up Note ? ? ?01/09/2022 ?Name: Catherine Giles MRN: [id number] DOB: [birth year] ? ? ?Referred by: Venita Lick, NP ?Reason for referral : No chief complaint on file. ? ? ?An unsuccessful telephone outreach was attempted today. The patient was referred to the case management team for assistance with care management and care coordination.  ? ?Follow Up Plan:  Left message with patient. No answer to 01/09/22 10am tel appt w/ pharmacist. Initial visit. Will notify care guide and hopefully have pt RS. ? ?Madelin Rear, PharmD, BCGP ?Clinical Pharmacist  ?[place]  ?[phone] ? ? ?

## 2022-01-11 DIAGNOSIS — J449 Chronic obstructive pulmonary disease, unspecified: Secondary | ICD-10-CM | POA: Diagnosis not present

## 2022-01-11 DIAGNOSIS — G479 Sleep disorder, unspecified: Secondary | ICD-10-CM | POA: Diagnosis not present

## 2022-01-11 DIAGNOSIS — R079 Chest pain, unspecified: Secondary | ICD-10-CM | POA: Diagnosis not present

## 2022-01-11 DIAGNOSIS — R053 Chronic cough: Secondary | ICD-10-CM | POA: Diagnosis not present

## 2022-01-11 DIAGNOSIS — R7989 Other specified abnormal findings of blood chemistry: Secondary | ICD-10-CM | POA: Diagnosis not present

## 2022-01-11 DIAGNOSIS — R0609 Other forms of dyspnea: Secondary | ICD-10-CM | POA: Diagnosis not present

## 2022-01-13 ENCOUNTER — Telehealth: Payer: Medicare Other

## 2022-01-13 ENCOUNTER — Telehealth: Payer: Self-pay

## 2022-01-13 NOTE — Telephone Encounter (Signed)
?  Care Management  ? ?Follow Up Note ? ? ?01/13/2022 ?Name: Catherine Giles MRN: [id number] DOB: September 17, 1971 ? ? ?Referred by: Venita Lick, NP ?Reason for referral : Chronic Care Management (RNCM: Follow up for Chronic Disease Management and Care Coordination Needs (Attempt)) ? ? ?The patient answered the phone but was not able to talk due to driving. She does want to talk with the RNCM. She ask for a call back next week during morning hours. Will reschedule patient.  ? ?Follow Up Plan: Telephone follow up appointment with care management team member scheduled for: 01-18-2022 at 0830 am ? ?Noreene Larsson RN, MSN, CCM ?Community Care Coordinator ?[facility] ?[place] ?Mobile: [phone]  ?

## 2022-01-18 ENCOUNTER — Telehealth: Payer: Medicare Other

## 2022-01-18 ENCOUNTER — Telehealth: Payer: Self-pay

## 2022-01-18 NOTE — Telephone Encounter (Signed)
?  Care Management  ? ?Follow Up Note ? ? ?01/18/2022 ?Name: Catherine Giles MRN: [id number] DOB: 12-07-1970 ? ? ?Referred by: Venita Lick, NP ?Reason for referral : Chronic Care Management (RNCM: Follow up for Chronic Disease Management and Care Coordination Needs) ? ? ?An unsuccessful telephone outreach was attempted today. The patient was referred to the case management team for assistance with care management and care coordination.  ? ?Follow Up Plan: A HIPPA compliant phone message was left for the patient providing contact information and requesting a return call.  ? ?Noreene Larsson RN, MSN, CCM ?Community Care Coordinator ?[facility] ?[place] ?Mobile: [phone]  ?

## 2022-01-19 DIAGNOSIS — J45909 Unspecified asthma, uncomplicated: Secondary | ICD-10-CM | POA: Diagnosis not present

## 2022-01-24 ENCOUNTER — Telehealth: Payer: Self-pay

## 2022-01-24 ENCOUNTER — Telehealth: Payer: Medicare Other

## 2022-01-24 NOTE — Telephone Encounter (Signed)
?  Care Management  ? ?Follow Up Note ? ? ?01/24/2022 ?Name: Catherine Giles MRN: [id number] DOB: 01-25-1971 ? ? ?Referred by: Venita Lick, NP ?Reason for referral : Chronic Care Management (RNCM: Follow up for Chronic Disease Management and Care Coordination Needs. The patient is unable to talk at this time) ? ? ?A second unsuccessful telephone outreach was attempted today. The patient was referred to the case management team for assistance with care management and care coordination. The patient answered and stated that she thought the appointment was this afternoon. Reminded the patient of the last outreach attempt when she ask for am calls. The patient states that she had a procedure at the dentist last week and she was headed to an appointment. Will have scheduler reschedule appointment.  ? ?Follow Up Plan: The care management team will reach out to the patient again over the next 30 to 60 days.  ? ?Noreene Larsson RN, MSN, CCM ?Community Care Coordinator ?[facility] [place] [facility] ?[place] ?Mobile: [phone]  ?

## 2022-02-07 ENCOUNTER — Ambulatory Visit: Payer: Medicare Other | Admitting: Unknown Physician Specialty

## 2022-02-08 ENCOUNTER — Ambulatory Visit: Payer: Medicare Other

## 2022-02-11 NOTE — Patient Instructions (Incomplete)
Alpha-gal Syndrome Alpha-gal syndrome (AGS) is an allergic reaction to a type of sugar commonly called alpha-gal. It is found in the meat and organ meats of mammals, such as cows, pigs, and sheep. It may also be found in products that come from animals, such as gelatin, medicines, medicine capsules, some milk products, vaccines, and cosmetics. AGS causes an allergic reaction that can be immediate or delayed for several hours and can range from mild to severe. A mild reaction may cause nausea, vomiting, or an itchy rash (hives). A severe reaction can cause breathing difficulties or loss of consciousness (anaphylaxis). This can be life-threatening. What are the causes? This allergy is first triggered by a tick bite from a lone star or blackleg tick. These ticks bite animals, such as cows, pigs, or sheep, and pick up the alpha-gal sugar from their blood. If the same tick bites you, it may cause your body's defense system (immune system) to produce antibodies to alpha-gal and cause the allergic reaction. What increases the risk? People who live in areas of the [place] where the lone star tick is common are at highest risk, these areas may include: [place]. [place]. [place] into parts of [place]. People who are hunters and people who work in jobs that involve caring for trees (foresters) have an increased risk of this condition. What are the signs or symptoms? AGS may not cause an allergic reaction every time you eat red meat or come into contact with alpha-gal. If you do have a reaction, symptoms may include: Hives. Severe stomachache. Nausea or vomiting. Swelling of the lips, face, tongue, or throat. Making a high-pitched whistling sound when you breathe, most often when you breathe out (wheezing). Sneezing and runny nose. Headache. Symptoms of anaphylaxis may include: Difficulty breathing. Difficulty swallowing. Dizziness. Fainting. This may happen due to a sudden drop in  blood pressure. You may have AGS if you had anaphylaxis after eating something but do not have any known food allergies. Unlike other food allergies, the reaction does not start soon after the exposure to alpha-gal. There may be a delay of several hours. How is this diagnosed? This condition may be diagnosed based on signs and symptoms of the condition, especially if you have a history of tick bites and a delayed reaction to red meat. You may also have a blood test to check for antibodies to alpha-gal or a skin test to see if there is a reaction to alpha-gal. How is this treated? This condition may be treated by: Avoiding meat and organ meats that may contain alpha-gal. Avoiding medicines or other products that may contain alpha-gal. Using medicines to reduce an allergic reaction. Carrying an epinephrine auto-injector to use in case of a severe AGS reaction. AGS should be treated by an allergist or a health care provider who has experience with AGS. Follow these instructions at home:  Medicines Take over-the-counter and prescription medicines only as told by your health care provider. Follow instructions from your health care provider about when and how to use an epinephrine auto-injector. General instructions Avoid red meat and organ meat, and check food labels for meat-based ingredients in packaged foods such as soup, gravy, and flavoring. Work with your allergist to find what other foods or products you may need to avoid, some people may benefit from avoiding dairy. Keep all follow-up visits. This is important. How is this prevented? Take steps to prevent tick bites as frequent tick bites may increase the risk of an AGS reaction. These steps   include: Avoiding woods and fields with high grass. Wearing clothing protected with the anti-tick chemical permethrin when outdoors in these areas or using a U.S. Environmental Protection Agency-approved insect repellent. Checking your clothing for  ticks before you come back indoors. Checking your body for ticks when you take a shower. Checking your pets for ticks. Where to find more information Centers for Disease Control and Prevention: www.cdc.gov National Institute of Allergy and Infectious Diseases: www.niaid.nih.gov Contact a health care provider if: You have any signs or symptoms of AGS food allergy. You have a tick bite that causes a skin reaction. Get help right away if: You have a severe AGS reaction. You have trouble swallowing or breathing. These symptoms may represent a serious problem that is an emergency. Do not wait to see if the symptoms will go away. Get medical help right away. Call your local emergency services (911 in the [place] Do not drive yourself to the hospital. Summary AGS is a food allergy caused by a tick bite. Red meats, organ meats, and other products or medicines may trigger the alpha-gal reaction. AGS reactions can be mild or severe. If you have AGS, you should not eat red meat, organ meat, or products that may contain alpha-gal. Avoiding tick bites prevents AGS and more serious AGS reactions. This information is not intended to replace advice given to you by your health care provider. Make sure you discuss any questions you have with your health care provider. Document Revised: 01/25/2021 Document Reviewed: 01/25/2021 Elsevier Patient Education  2023 Elsevier Inc.  

## 2022-02-13 ENCOUNTER — Ambulatory Visit: Payer: Medicare Other | Admitting: Nurse Practitioner

## 2022-02-15 ENCOUNTER — Ambulatory Visit (INDEPENDENT_AMBULATORY_CARE_PROVIDER_SITE_OTHER): Payer: Medicare Other | Admitting: Nurse Practitioner

## 2022-02-15 ENCOUNTER — Encounter: Payer: Self-pay | Admitting: Nurse Practitioner

## 2022-02-15 VITALS — BP 138/84 | HR 93 | Temp 98.3°F | Ht 63.0 in | Wt 178.2 lb

## 2022-02-15 DIAGNOSIS — M0579 Rheumatoid arthritis with rheumatoid factor of multiple sites without organ or systems involvement: Secondary | ICD-10-CM

## 2022-02-15 DIAGNOSIS — Z91018 Allergy to other foods: Secondary | ICD-10-CM

## 2022-02-15 DIAGNOSIS — M797 Fibromyalgia: Secondary | ICD-10-CM | POA: Diagnosis not present

## 2022-02-15 DIAGNOSIS — Z0289 Encounter for other administrative examinations: Secondary | ICD-10-CM | POA: Diagnosis not present

## 2022-02-15 MED ORDER — EPINEPHRINE 0.3 MG/0.3ML IJ SOAJ: 0.3000 mg | INTRAMUSCULAR | 4 refills | Status: AC | PRN

## 2022-02-15 NOTE — Assessment & Plan Note (Signed)
Chronic, ongoing.  Followed by rheumatology, continue this collaboration.  At this time continue current medications ordered by them and adjust as needed.  Continue taking Duloxetine, which is beneficial for pain and mood.  ?

## 2022-02-15 NOTE — Assessment & Plan Note (Signed)
Chronic, ongoing, followed by rheumatology.  Continue this collaboration and medication regimen as prescribed by them + Duloxetine as ordered by PCP for mood and pain.  Adjust regimen as needed. ?

## 2022-02-15 NOTE — Progress Notes (Signed)
? ?BP 138/84 (BP Location: Left Arm)   Pulse 93   Temp 98.3 ?F (36.8 ?C) (Oral)   Ht '5\' 3"'$  (1.6 m)   Wt 178 lb 3.2 oz (80.8 kg)   SpO2 94%   BMI 31.57 kg/m?   ? ?Subjective:  ? ? Patient ID: Catherine Giles, female    DOB: 28-Oct-1970, 51 y.o.   MRN: [id number] ? ?HPI: ?Catherine Giles is a 51 y.o. female ? ?Chief Complaint  ?Patient presents with  ? Forms  ?  Patient states she is here to have some documents signed at today's visit. Patient states she would like to discuss issues she is having with provider at today's visit.   ? ?FORM COMPLETION: ?Reports she needs forms filled out today to show her daughter's need to go to camp since she is unable to care for her during daytime hours due to her chronic pain. ? ?CHRONIC PAIN WITH RA & ALPHA GAL ?Followed by rheumatology for RA with last visit 01/03/22 -- continues infusions (Golimumab) + Arava.  She reports these were helping until the past 2 weeks when pain became worse again, she can not even get up in bed as her feet hurt.  They are trying to get approval for more frequent infusions.  Instead of getting better she is concerned for getting worse. ? ?Recent diagnosis of Alpha Gal, she was not aware of diet changes needed for this and has multiple symptoms: Diarrhea, abdominal pain, joint pain -- educated her on diet changes needed. ?Pain control status: stable ?Duration: chronic ?Location: arms and hands are the worst -- legs ?Quality: dull, aching, burning and throbbing ?Current Pain Level: 6/10 ?Previous Pain Level: 10/10 ?What Activities task can be accomplished with current medication? 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  ?Relevant past medical, surgical, family and social history reviewed and updated as indicated. Interim medical history since our last visit reviewed. ?Allergies and medications reviewed and updated. ? ?Review of Systems  ?Constitutional:  Negative for activity change,  appetite change, diaphoresis, fatigue and fever.  ?Respiratory:  Negative for cough, chest tightness, shortness of breath and wheezing.   ?Cardiovascular:  Negative for chest pain, palpitations and leg swelling.  ?Musculoskeletal:  Positive for arthralgias.  ?Neurological: Negative.   ?Psychiatric/Behavioral:  Negative for decreased concentration, self-injury, sleep disturbance and suicidal ideas. The patient is nervous/anxious.   ? ?Per HPI unless specifically indicated above ? ?   ?Objective:  ?  ?BP 138/84 (BP Location: Left Arm)   Pulse 93   Temp 98.3 ?F (36.8 ?C) (Oral)   Ht '5\' 3"'$  (1.6 m)   Wt 178 lb 3.2 oz (80.8 kg)   SpO2 94%   BMI 31.57 kg/m?   ?Wt Readings from Last 3 Encounters:  ?02/15/22 178 lb 3.2 oz (80.8 kg)  ?01/03/22 176 lb 9.6 oz (80.1 kg)  ?12/02/21 174 lb 6.4 oz (79.1 kg)  ?  ?Physical Exam ?Vitals and nursing note reviewed.  ?Constitutional:   ?   General: She is awake. She is not in acute distress. ?   Appearance: She is well-developed and well-groomed. She is obese. She is not ill-appearing or toxic-appearing.  ?HENT:  ?   Head: Normocephalic.  ?   Right Ear: Hearing normal.  ?   Left Ear: Hearing normal.  ?Eyes:  ?   General: Lids are normal.     ?   Right eye: No discharge.     ?  Left eye: No discharge.  ?   Conjunctiva/sclera: Conjunctivae normal.  ?   Pupils: Pupils are equal, round, and reactive to light.  ?Neck:  ?   Thyroid: No thyromegaly.  ?   Vascular: No carotid bruit.  ?Cardiovascular:  ?   Rate and Rhythm: Normal rate and regular rhythm.  ?   Heart sounds: Normal heart sounds. No murmur heard. ?  No gallop.  ?Pulmonary:  ?   Effort: Pulmonary effort is normal. No accessory muscle usage or respiratory distress.  ?   Breath sounds: Normal breath sounds.  ?Abdominal:  ?   General: Bowel sounds are normal. There is no distension.  ?   Palpations: Abdomen is soft.  ?   Tenderness: There is no right CVA tenderness or left CVA tenderness.  ?Musculoskeletal:  ?   Cervical back:  Normal range of motion and neck supple.  ?   Right lower leg: No edema.  ?   Left lower leg: No edema.  ?Skin: ?   General: Skin is warm and dry.  ?Neurological:  ?   Mental Status: She is alert and oriented to person, place, and time.  ?Psychiatric:     ?   Attention and Perception: Attention normal.     ?   Mood and Affect: Mood normal.     ?   Speech: Speech normal.     ?   Behavior: Behavior normal. Behavior is cooperative.     ?   Thought Content: Thought content normal.  ? ?Results for orders placed or performed in visit on 01/03/22  ?Alpha-Gal Panel  ?Result Value Ref Range  ? Class Description Allergens Comment   ? IgE (Immunoglobulin E), Serum 3,206 (H) 6 - 495 IU/mL  ? [id number] Alpha-Gal <0.10 Class 0 kU/L  ? Beef IgE 0.15 (A) Class 0/I kU/L  ? Pork IgE <0.10 Class 0 kU/L  ? Allergen Lamb IgE 0.14 (A) Class 0/I kU/L  ?Tissue Transglutaminase Abs,IgG,IgA  ?Result Value Ref Range  ? Transglutaminase IgA <2 0 - 3 U/mL  ? Tissue Transglut Ab <2 0 - 5 U/mL  ?CBC with Differential/Platelet  ?Result Value Ref Range  ? WBC 11.8 (H) 3.4 - 10.8 x10E3/uL  ? RBC 4.65 3.77 - 5.28 x10E6/uL  ? Hemoglobin 14.7 11.1 - 15.9 g/dL  ? Hematocrit 44.1 34.0 - 46.6 %  ? MCV 95 79 - 97 fL  ? MCH 31.6 26.6 - 33.0 pg  ? MCHC 33.3 31.5 - 35.7 g/dL  ? RDW 12.3 11.7 - 15.4 %  ? Platelets 369 150 - 450 x10E3/uL  ? Neutrophils 45 Not Estab. %  ? Lymphs 50 Not Estab. %  ? Monocytes 5 Not Estab. %  ? Eos 0 Not Estab. %  ? Basos 0 Not Estab. %  ? Neutrophils Absolute 5.3 1.4 - 7.0 x10E3/uL  ? Lymphocytes Absolute 5.8 (H) 0.7 - 3.1 x10E3/uL  ? Monocytes Absolute 0.6 0.1 - 0.9 x10E3/uL  ? EOS (ABSOLUTE) 0.0 0.0 - 0.4 x10E3/uL  ? Basophils Absolute 0.1 0.0 - 0.2 x10E3/uL  ? Immature Granulocytes 0 Not Estab. %  ? Immature Grans (Abs) 0.0 0.0 - 0.1 x10E3/uL  ? ?   ?Assessment & Plan:  ? ?Problem List Items Addressed This Visit   ? ?  ? Musculoskeletal and Integument  ? Rheumatoid arthritis involving multiple sites with positive rheumatoid  factor ([place]) - Primary  ?  Chronic, ongoing.  Followed by rheumatology, continue this collaboration.  At this time continue  current medications ordered by them and adjust as needed.  Continue taking Duloxetine, which is beneficial for pain and mood.  ? ?  ?  ?  ? Other  ? Allergy to alpha-gal  ?  New diagnosis as of 01/03/22. Educated patient at length on local support groups and diet changes needed with this allergy, as she has ongoing symptoms but has not been changing diet.  Notices issues most with milk.  At this time will place referral to specialist on Alpha Gal to assist with education and monitoring + send in Epi pen to use as needed.   ? ?  ?  ? Relevant Orders  ? Ambulatory referral to ENT  ? Fibromyalgia  ?  Chronic, ongoing, followed by rheumatology.  Continue this collaboration and medication regimen as prescribed by them + Duloxetine as ordered by PCP for mood and pain.  Adjust regimen as needed. ? ?  ?  ? ?Other Visit Diagnoses   ? ? Encounter for completion of form with patient      ? Forms completed today per patient request.  ? ?  ?  ? ?Follow up plan: ?Return for as scheduled in May. ? ? ? ? ? ?

## 2022-02-15 NOTE — Assessment & Plan Note (Signed)
New diagnosis as of 01/03/22. Educated patient at length on local support groups and diet changes needed with this allergy, as she has ongoing symptoms but has not been changing diet.  Notices issues most with milk.  At this time will place referral to specialist on Alpha Gal to assist with education and monitoring + send in Epi pen to use as needed.   ?

## 2022-02-15 NOTE — Patient Instructions (Signed)
Alpha-gal Syndrome Alpha-gal syndrome (AGS) is an allergic reaction to a type of sugar commonly called alpha-gal. It is found in the meat and organ meats of mammals, such as cows, pigs, and sheep. It may also be found in products that come from animals, such as gelatin, medicines, medicine capsules, some milk products, vaccines, and cosmetics. AGS causes an allergic reaction that can be immediate or delayed for several hours and can range from mild to severe. A mild reaction may cause nausea, vomiting, or an itchy rash (hives). A severe reaction can cause breathing difficulties or loss of consciousness (anaphylaxis). This can be life-threatening. What are the causes? This allergy is first triggered by a tick bite from a lone star or blackleg tick. These ticks bite animals, such as cows, pigs, or sheep, and pick up the alpha-gal sugar from their blood. If the same tick bites you, it may cause your body's defense system (immune system) to produce antibodies to alpha-gal and cause the allergic reaction. What increases the risk? People who live in areas of the [place] where the lone star tick is common are at highest risk, these areas may include: [place]. [place]. [place] into parts of [place]. People who are hunters and people who work in jobs that involve caring for trees (foresters) have an increased risk of this condition. What are the signs or symptoms? AGS may not cause an allergic reaction every time you eat red meat or come into contact with alpha-gal. If you do have a reaction, symptoms may include: Hives. Severe stomachache. Nausea or vomiting. Swelling of the lips, face, tongue, or throat. Making a high-pitched whistling sound when you breathe, most often when you breathe out (wheezing). Sneezing and runny nose. Headache. Symptoms of anaphylaxis may include: Difficulty breathing. Difficulty swallowing. Dizziness. Fainting. This may happen due to a sudden drop in  blood pressure. You may have AGS if you had anaphylaxis after eating something but do not have any known food allergies. Unlike other food allergies, the reaction does not start soon after the exposure to alpha-gal. There may be a delay of several hours. How is this diagnosed? This condition may be diagnosed based on signs and symptoms of the condition, especially if you have a history of tick bites and a delayed reaction to red meat. You may also have a blood test to check for antibodies to alpha-gal or a skin test to see if there is a reaction to alpha-gal. How is this treated? This condition may be treated by: Avoiding meat and organ meats that may contain alpha-gal. Avoiding medicines or other products that may contain alpha-gal. Using medicines to reduce an allergic reaction. Carrying an epinephrine auto-injector to use in case of a severe AGS reaction. AGS should be treated by an allergist or a health care provider who has experience with AGS. Follow these instructions at home:  Medicines Take over-the-counter and prescription medicines only as told by your health care provider. Follow instructions from your health care provider about when and how to use an epinephrine auto-injector. General instructions Avoid red meat and organ meat, and check food labels for meat-based ingredients in packaged foods such as soup, gravy, and flavoring. Work with your allergist to find what other foods or products you may need to avoid, some people may benefit from avoiding dairy. Keep all follow-up visits. This is important. How is this prevented? Take steps to prevent tick bites as frequent tick bites may increase the risk of an AGS reaction. These steps   include: Avoiding woods and fields with high grass. Wearing clothing protected with the anti-tick chemical permethrin when outdoors in these areas or using a U.S. Environmental Protection Agency-approved insect repellent. Checking your clothing for  ticks before you come back indoors. Checking your body for ticks when you take a shower. Checking your pets for ticks. Where to find more information Centers for Disease Control and Prevention: www.cdc.gov National Institute of Allergy and Infectious Diseases: www.niaid.nih.gov Contact a health care provider if: You have any signs or symptoms of AGS food allergy. You have a tick bite that causes a skin reaction. Get help right away if: You have a severe AGS reaction. You have trouble swallowing or breathing. These symptoms may represent a serious problem that is an emergency. Do not wait to see if the symptoms will go away. Get medical help right away. Call your local emergency services (911 in the [place] Do not drive yourself to the hospital. Summary AGS is a food allergy caused by a tick bite. Red meats, organ meats, and other products or medicines may trigger the alpha-gal reaction. AGS reactions can be mild or severe. If you have AGS, you should not eat red meat, organ meat, or products that may contain alpha-gal. Avoiding tick bites prevents AGS and more serious AGS reactions. This information is not intended to replace advice given to you by your health care provider. Make sure you discuss any questions you have with your health care provider. Document Revised: 01/25/2021 Document Reviewed: 01/25/2021 Elsevier Patient Education  2023 Elsevier Inc.  

## 2022-02-20 HISTORY — PX: CHOLECYSTECTOMY, LAPAROSCOPIC: SHX56

## 2022-02-22 ENCOUNTER — Telehealth: Payer: Self-pay

## 2022-02-22 NOTE — Chronic Care Management (AMB) (Signed)
  Chronic Care Management Note  02/22/2022 Name: Maitri Schnoebelen MRN: [id number] DOB: [birth year]  Elaura Calix is a 51 y.o. year old female who is a primary care patient of Venita Lick, NP and is actively engaged with the care management team. I reached out to [employer] by phone today to assist with re-scheduling a follow up visit with the RN Case Manager and Pharmacist  Follow up plan: Unsuccessful telephone outreach attempt made. A HIPAA compliant phone message was left for the patient providing contact information and requesting a return call.  The care management team will reach out to the patient again over the next 7 days.  If patient returns call to provider office, please advise to call [facility]  [facility] [place], [place], [place], [place] [address] Direct Dial: [phone] [email or web address]: [email or web address]

## 2022-02-24 ENCOUNTER — Telehealth: Payer: Self-pay

## 2022-02-24 ENCOUNTER — Telehealth: Payer: Medicare Other

## 2022-02-24 NOTE — Telephone Encounter (Signed)
?  Care Management  ? ?Follow Up Note ? ? ?02/24/2022 ?Name: Catherine Giles MRN: [id number] DOB: 1970-11-30 ? ? ?Referred by: Venita Lick, NP ?Reason for referral : Chronic Care Management (RNCM: Follow up for Chronic Disease Management and Care Coordination Needs ) ? ? ?Third unsuccessful telephone outreach was attempted today. The patient was referred to the case management team for assistance with care management and care coordination. The patient's primary care provider has been notified of our unsuccessful attempts to make or maintain contact with the patient. The care management team is pleased to engage with this patient at any time in the future should he/she be interested in assistance from the care management team.  ? ?Follow Up Plan: We have been unable to make contact with the patient for follow up. The care management team is available to follow up with the patient after provider conversation with the patient regarding recommendation for care management engagement and subsequent re-referral to the care management team.  ? ?Noreene Larsson RN, MSN, CCM ?Community Care Coordinator ?[facility] ?[place] ?Mobile: [phone]  ?

## 2022-03-03 ENCOUNTER — Ambulatory Visit: Payer: Medicare Other | Admitting: Nurse Practitioner

## 2022-03-03 DIAGNOSIS — E1169 Type 2 diabetes mellitus with other specified complication: Secondary | ICD-10-CM

## 2022-03-03 DIAGNOSIS — E063 Autoimmune thyroiditis: Secondary | ICD-10-CM

## 2022-03-03 DIAGNOSIS — M0579 Rheumatoid arthritis with rheumatoid factor of multiple sites without organ or systems involvement: Secondary | ICD-10-CM

## 2022-03-03 DIAGNOSIS — J432 Centrilobular emphysema: Secondary | ICD-10-CM

## 2022-03-03 DIAGNOSIS — E1159 Type 2 diabetes mellitus with other circulatory complications: Secondary | ICD-10-CM

## 2022-03-03 DIAGNOSIS — E6609 Other obesity due to excess calories: Secondary | ICD-10-CM

## 2022-03-03 DIAGNOSIS — F320 Major depressive disorder, single episode, mild: Secondary | ICD-10-CM

## 2022-03-03 DIAGNOSIS — Z91018 Allergy to other foods: Secondary | ICD-10-CM

## 2022-03-12 ENCOUNTER — Encounter: Payer: Self-pay | Admitting: Internal Medicine

## 2022-03-12 ENCOUNTER — Emergency Department: Payer: Medicare Other

## 2022-03-12 ENCOUNTER — Inpatient Hospital Stay
Admission: EM | Admit: 2022-03-12 | Discharge: 2022-03-15 | DRG: 854 | Disposition: A | Discharge status: Home / Self Care | Payer: Medicare Other | Attending: Obstetrics and Gynecology | Admitting: Obstetrics and Gynecology

## 2022-03-12 ENCOUNTER — Other Ambulatory Visit: Payer: Self-pay

## 2022-03-12 ENCOUNTER — Inpatient Hospital Stay: Payer: Medicare Other | Admitting: Registered Nurse

## 2022-03-12 ENCOUNTER — Encounter
Admission: EM | Disposition: A | Discharge status: Home / Self Care | Payer: Self-pay | Source: Home / Self Care | Attending: Internal Medicine

## 2022-03-12 DIAGNOSIS — E1169 Type 2 diabetes mellitus with other specified complication: Secondary | ICD-10-CM | POA: Diagnosis present

## 2022-03-12 DIAGNOSIS — J439 Emphysema, unspecified: Secondary | ICD-10-CM | POA: Diagnosis not present

## 2022-03-12 DIAGNOSIS — Z8041 Family history of malignant neoplasm of ovary: Secondary | ICD-10-CM

## 2022-03-12 DIAGNOSIS — E872 Acidosis, unspecified: Secondary | ICD-10-CM | POA: Diagnosis present

## 2022-03-12 DIAGNOSIS — Z7951 Long term (current) use of inhaled steroids: Secondary | ICD-10-CM

## 2022-03-12 DIAGNOSIS — Z7982 Long term (current) use of aspirin: Secondary | ICD-10-CM

## 2022-03-12 DIAGNOSIS — K8000 Calculus of gallbladder with acute cholecystitis without obstruction: Secondary | ICD-10-CM | POA: Diagnosis present

## 2022-03-12 DIAGNOSIS — E876 Hypokalemia: Secondary | ICD-10-CM | POA: Diagnosis present

## 2022-03-12 DIAGNOSIS — A419 Sepsis, unspecified organism: Principal | ICD-10-CM | POA: Diagnosis present

## 2022-03-12 DIAGNOSIS — E1159 Type 2 diabetes mellitus with other circulatory complications: Secondary | ICD-10-CM

## 2022-03-12 DIAGNOSIS — E063 Autoimmune thyroiditis: Secondary | ICD-10-CM | POA: Diagnosis present

## 2022-03-12 DIAGNOSIS — J449 Chronic obstructive pulmonary disease, unspecified: Secondary | ICD-10-CM | POA: Diagnosis present

## 2022-03-12 DIAGNOSIS — E039 Hypothyroidism, unspecified: Secondary | ICD-10-CM | POA: Diagnosis present

## 2022-03-12 DIAGNOSIS — I152 Hypertension secondary to endocrine disorders: Secondary | ICD-10-CM | POA: Diagnosis present

## 2022-03-12 DIAGNOSIS — K8020 Calculus of gallbladder without cholecystitis without obstruction: Principal | ICD-10-CM

## 2022-03-12 DIAGNOSIS — Z801 Family history of malignant neoplasm of trachea, bronchus and lung: Secondary | ICD-10-CM | POA: Diagnosis not present

## 2022-03-12 DIAGNOSIS — Z803 Family history of malignant neoplasm of breast: Secondary | ICD-10-CM | POA: Diagnosis not present

## 2022-03-12 DIAGNOSIS — Z79899 Other long term (current) drug therapy: Secondary | ICD-10-CM | POA: Diagnosis not present

## 2022-03-12 DIAGNOSIS — Z8249 Family history of ischemic heart disease and other diseases of the circulatory system: Secondary | ICD-10-CM

## 2022-03-12 DIAGNOSIS — Z79890 Hormone replacement therapy (postmenopausal): Secondary | ICD-10-CM

## 2022-03-12 DIAGNOSIS — F418 Other specified anxiety disorders: Secondary | ICD-10-CM | POA: Diagnosis present

## 2022-03-12 DIAGNOSIS — K8012 Calculus of gallbladder with acute and chronic cholecystitis without obstruction: Secondary | ICD-10-CM | POA: Diagnosis not present

## 2022-03-12 DIAGNOSIS — Z720 Tobacco use: Secondary | ICD-10-CM | POA: Diagnosis present

## 2022-03-12 DIAGNOSIS — R1010 Upper abdominal pain, unspecified: Secondary | ICD-10-CM | POA: Diagnosis not present

## 2022-03-12 DIAGNOSIS — F17210 Nicotine dependence, cigarettes, uncomplicated: Secondary | ICD-10-CM | POA: Diagnosis present

## 2022-03-12 DIAGNOSIS — N281 Cyst of kidney, acquired: Secondary | ICD-10-CM | POA: Diagnosis not present

## 2022-03-12 DIAGNOSIS — F419 Anxiety disorder, unspecified: Secondary | ICD-10-CM | POA: Diagnosis present

## 2022-03-12 DIAGNOSIS — M0579 Rheumatoid arthritis with rheumatoid factor of multiple sites without organ or systems involvement: Secondary | ICD-10-CM | POA: Diagnosis not present

## 2022-03-12 DIAGNOSIS — Z91013 Allergy to seafood: Secondary | ICD-10-CM

## 2022-03-12 DIAGNOSIS — I5032 Chronic diastolic (congestive) heart failure: Secondary | ICD-10-CM | POA: Diagnosis present

## 2022-03-12 DIAGNOSIS — I10 Essential (primary) hypertension: Secondary | ICD-10-CM

## 2022-03-12 DIAGNOSIS — K828 Other specified diseases of gallbladder: Secondary | ICD-10-CM | POA: Diagnosis not present

## 2022-03-12 DIAGNOSIS — R197 Diarrhea, unspecified: Secondary | ICD-10-CM | POA: Diagnosis present

## 2022-03-12 DIAGNOSIS — K810 Acute cholecystitis: Secondary | ICD-10-CM | POA: Diagnosis present

## 2022-03-12 DIAGNOSIS — Z9049 Acquired absence of other specified parts of digestive tract: Secondary | ICD-10-CM | POA: Diagnosis present

## 2022-03-12 DIAGNOSIS — Z91018 Allergy to other foods: Secondary | ICD-10-CM | POA: Diagnosis not present

## 2022-03-12 DIAGNOSIS — K760 Fatty (change of) liver, not elsewhere classified: Secondary | ICD-10-CM | POA: Diagnosis not present

## 2022-03-12 DIAGNOSIS — I110 Hypertensive heart disease with heart failure: Secondary | ICD-10-CM | POA: Diagnosis not present

## 2022-03-12 DIAGNOSIS — E119 Type 2 diabetes mellitus without complications: Secondary | ICD-10-CM

## 2022-03-12 DIAGNOSIS — F32A Depression, unspecified: Secondary | ICD-10-CM | POA: Diagnosis not present

## 2022-03-12 DIAGNOSIS — M797 Fibromyalgia: Secondary | ICD-10-CM | POA: Diagnosis not present

## 2022-03-12 DIAGNOSIS — Z833 Family history of diabetes mellitus: Secondary | ICD-10-CM | POA: Diagnosis not present

## 2022-03-12 LAB — PROCALCITONIN: Procalcitonin: <0.1 ng/mL

## 2022-03-12 LAB — COMPREHENSIVE METABOLIC PANEL
ALT: 23 U/L (ref 0–44)
AST: 24 U/L (ref 15–41)
Albumin: 4.2 g/dL (ref 3.5–5.0)
Alkaline Phosphatase: 65 U/L (ref 38–126)
Anion gap: 9 (ref 5–15)
BUN: 18 mg/dL (ref 6–20)
CO2: 24 mmol/L (ref 22–32)
Calcium: 9.2 mg/dL (ref 8.9–10.3)
Chloride: 109 mmol/L (ref 98–111)
Creatinine, Ser: 0.52 mg/dL (ref 0.44–1.00)
GFR, Estimated: >60 mL/min (ref >60)
Glucose, Bld: 185 mg/dL — ABNORMAL HIGH (ref 70–99)
Potassium: 3.7 mmol/L (ref 3.5–5.1)
Sodium: 142 mmol/L (ref 135–145)
Total Bilirubin: 0.5 mg/dL (ref 0.3–1.2)
Total Protein: 7.8 g/dL (ref 6.5–8.1)

## 2022-03-12 LAB — CBC
HCT: 43.7 % (ref 36.0–46.0)
Hemoglobin: 14.1 g/dL (ref 12.0–15.0)
MCH: 30.5 pg (ref 26.0–34.0)
MCHC: 32.3 g/dL (ref 30.0–36.0)
MCV: 94.6 fL (ref 80.0–100.0)
Platelets: 376 10*3/uL (ref 150–400)
RBC: 4.62 MIL/uL (ref 3.87–5.11)
RDW: 14.2 % (ref 11.5–15.5)
WBC: 11 10*3/uL — ABNORMAL HIGH (ref 4.0–10.5)
nRBC: 0 % (ref 0.0–0.2)

## 2022-03-12 LAB — TYPE AND SCREEN
ABO/RH(D): A POS
Antibody Screen: NEGATIVE

## 2022-03-12 LAB — PROTIME-INR
INR: 1.1 (ref 0.8–1.2)
Prothrombin Time: 13.7 seconds (ref 11.4–15.2)

## 2022-03-12 LAB — LACTIC ACID, PLASMA: Lactic Acid, Venous: 1.7 mmol/L (ref 0.5–1.9)

## 2022-03-12 LAB — TROPONIN I (HIGH SENSITIVITY): Troponin I (High Sensitivity): 5 ng/L (ref <18)

## 2022-03-12 LAB — LIPASE, BLOOD: Lipase: 25 U/L (ref 11–51)

## 2022-03-12 LAB — BRAIN NATRIURETIC PEPTIDE: B Natriuretic Peptide: 23.5 pg/mL (ref 0.0–100.0)

## 2022-03-12 LAB — APTT: aPTT: 28 seconds (ref 24–36)

## 2022-03-12 LAB — GLUCOSE, CAPILLARY: Glucose-Capillary: 179 mg/dL — ABNORMAL HIGH (ref 70–99)

## 2022-03-12 SURGERY — CHOLECYSTECTOMY, ROBOT-ASSISTED, LAPAROSCOPIC
Anesthesia: General

## 2022-03-12 MED ORDER — LEFLUNOMIDE 20 MG PO TABS
20.0000 mg | ORAL_TABLET | Freq: Every day | ORAL | Status: DC
  Administered 2022-03-13 – 2022-03-15 (×3): 20 mg via ORAL
  Filled 2022-03-12 (×4): qty 1

## 2022-03-12 MED ORDER — EPHEDRINE SULFATE (PRESSORS) 50 MG/ML IJ SOLN
INTRAMUSCULAR | Status: DC | PRN
  Administered 2022-03-12 (×2): 5 mg via INTRAVENOUS

## 2022-03-12 MED ORDER — ONDANSETRON HCL 4 MG/2ML IJ SOLN
INTRAMUSCULAR | Status: DC | PRN
  Administered 2022-03-12: 4 mg via INTRAVENOUS

## 2022-03-12 MED ORDER — ONDANSETRON HCL 4 MG/2ML IJ SOLN: 4.0000 mg | Freq: Three times a day (TID) | INTRAMUSCULAR | Status: DC | PRN

## 2022-03-12 MED ORDER — NICOTINE 21 MG/24HR TD PT24
21.0000 mg | MEDICATED_PATCH | Freq: Every day | TRANSDERMAL | Status: DC
  Administered 2022-03-12 – 2022-03-14 (×3): 21 mg via TRANSDERMAL
  Filled 2022-03-12 (×4): qty 1

## 2022-03-12 MED ORDER — DEXMEDETOMIDINE (PRECEDEX) IN NS 20 MCG/5ML (4 MCG/ML) IV SYRINGE
PREFILLED_SYRINGE | INTRAVENOUS | Status: DC | PRN
  Administered 2022-03-12: 4 ug via INTRAVENOUS
  Administered 2022-03-12: 8 ug via INTRAVENOUS

## 2022-03-12 MED ORDER — FLUTICASONE FUROATE-VILANTEROL 100-25 MCG/ACT IN AEPB
1.0000 | INHALATION_SPRAY | Freq: Every day | RESPIRATORY_TRACT | Status: DC
Start: 2022-03-12 — End: 2022-03-15
  Administered 2022-03-13 – 2022-03-15 (×3): 1 via RESPIRATORY_TRACT
  Filled 2022-03-12: qty 28

## 2022-03-12 MED ORDER — BUPIVACAINE-EPINEPHRINE (PF) 0.25% -1:200000 IJ SOLN
INTRAMUSCULAR | Status: DC | PRN
  Administered 2022-03-12: 40 mL

## 2022-03-12 MED ORDER — BUPIVACAINE LIPOSOME 1.3 % IJ SUSP
INTRAMUSCULAR | Status: AC
  Filled 2022-03-12: qty 10

## 2022-03-12 MED ORDER — HYDROMORPHONE HCL 1 MG/ML IJ SOLN
0.5000 mg | INTRAMUSCULAR | Status: DC | PRN
Start: 2022-03-12 — End: 2022-03-15
  Administered 2022-03-13 – 2022-03-14 (×2): 0.5 mg via INTRAVENOUS
  Filled 2022-03-12 (×3): qty 0.5

## 2022-03-12 MED ORDER — FAMOTIDINE 20 MG PO TABS
20.0000 mg | ORAL_TABLET | Freq: Two times a day (BID) | ORAL | Status: DC
  Administered 2022-03-12 – 2022-03-15 (×6): 20 mg via ORAL
  Filled 2022-03-12 (×6): qty 1

## 2022-03-12 MED ORDER — SEVOFLURANE IN SOLN
RESPIRATORY_TRACT | Status: AC
  Filled 2022-03-12: qty 250

## 2022-03-12 MED ORDER — LIDOCAINE HCL (CARDIAC) PF 100 MG/5ML IV SOSY
PREFILLED_SYRINGE | INTRAVENOUS | Status: DC | PRN
  Administered 2022-03-12: 100 mg via INTRAVENOUS

## 2022-03-12 MED ORDER — HYDROMORPHONE HCL 1 MG/ML IJ SOLN
1.0000 mg | INTRAMUSCULAR | Status: DC | PRN
  Administered 2022-03-12: 1 mg via INTRAVENOUS
  Filled 2022-03-12: qty 1

## 2022-03-12 MED ORDER — SODIUM CHLORIDE 0.9 % IV SOLN
INTRAVENOUS | Status: DC
Start: 2022-03-12 — End: 2022-03-15

## 2022-03-12 MED ORDER — IBUPROFEN 400 MG PO TABS: 600.0000 mg | ORAL_TABLET | Freq: Three times a day (TID) | ORAL | Status: DC | PRN

## 2022-03-12 MED ORDER — ROCURONIUM BROMIDE 100 MG/10ML IV SOLN
INTRAVENOUS | Status: DC | PRN
  Administered 2022-03-12: 50 mg via INTRAVENOUS
  Administered 2022-03-12: 20 mg via INTRAVENOUS

## 2022-03-12 MED ORDER — IBUPROFEN 400 MG PO TABS: 800.0000 mg | ORAL_TABLET | Freq: Three times a day (TID) | ORAL | Status: DC | PRN

## 2022-03-12 MED ORDER — 0.9 % SODIUM CHLORIDE (POUR BTL) OPTIME
TOPICAL | Status: DC | PRN
  Administered 2022-03-12: 500 mL

## 2022-03-12 MED ORDER — DULOXETINE HCL 30 MG PO CPEP
30.0000 mg | ORAL_CAPSULE | Freq: Two times a day (BID) | ORAL | Status: DC
  Administered 2022-03-12 – 2022-03-15 (×6): 30 mg via ORAL
  Filled 2022-03-12 (×6): qty 1

## 2022-03-12 MED ORDER — LORATADINE 10 MG PO TABS
10.0000 mg | ORAL_TABLET | Freq: Every day | ORAL | Status: DC
  Administered 2022-03-13 – 2022-03-15 (×3): 10 mg via ORAL
  Filled 2022-03-12 (×3): qty 1

## 2022-03-12 MED ORDER — INSULIN ASPART 100 UNIT/ML IJ SOLN: 0.0000 [IU] | Freq: Every day | INTRAMUSCULAR | Status: DC

## 2022-03-12 MED ORDER — ONDANSETRON HCL 4 MG/2ML IJ SOLN: 4.0000 mg | Freq: Once | INTRAMUSCULAR | Status: DC | PRN

## 2022-03-12 MED ORDER — IOHEXOL 300 MG/ML  SOLN
100.0000 mL | Freq: Once | INTRAMUSCULAR | Status: AC | PRN
  Administered 2022-03-12: 100 mL via INTRAVENOUS

## 2022-03-12 MED ORDER — OXYCODONE HCL 5 MG PO TABS
5.0000 mg | ORAL_TABLET | ORAL | Status: DC | PRN
  Administered 2022-03-12 – 2022-03-15 (×8): 10 mg via ORAL
  Filled 2022-03-12 (×9): qty 2

## 2022-03-12 MED ORDER — PHENYLEPHRINE HCL-NACL 20-0.9 MG/250ML-% IV SOLN
INTRAVENOUS | Status: AC
  Filled 2022-03-12: qty 250

## 2022-03-12 MED ORDER — INSULIN ASPART 100 UNIT/ML IJ SOLN
0.0000 [IU] | Freq: Three times a day (TID) | INTRAMUSCULAR | Status: DC
  Administered 2022-03-13 (×2): 2 [IU] via SUBCUTANEOUS
  Administered 2022-03-13: 1 [IU] via SUBCUTANEOUS
  Administered 2022-03-14: 2 [IU] via SUBCUTANEOUS
  Filled 2022-03-12 (×4): qty 1

## 2022-03-12 MED ORDER — HYDROCORTISONE SOD SUC (PF) 100 MG IJ SOLR
100.0000 mg | Freq: Two times a day (BID) | INTRAMUSCULAR | Status: DC
Start: 2022-03-12 — End: 2022-03-14
  Administered 2022-03-12 – 2022-03-14 (×4): 100 mg via INTRAVENOUS
  Filled 2022-03-12 (×5): qty 2

## 2022-03-12 MED ORDER — ALBUTEROL SULFATE (2.5 MG/3ML) 0.083% IN NEBU
2.5000 mg | INHALATION_SOLUTION | RESPIRATORY_TRACT | Status: DC | PRN
Start: 2022-03-12 — End: 2022-03-12

## 2022-03-12 MED ORDER — CLONIDINE HCL 0.1 MG PO TABS
0.1000 mg | ORAL_TABLET | Freq: Two times a day (BID) | ORAL | Status: DC
  Administered 2022-03-12 – 2022-03-15 (×6): 0.1 mg via ORAL
  Filled 2022-03-12 (×6): qty 1

## 2022-03-12 MED ORDER — INDOCYANINE GREEN 25 MG IV SOLR
2.5000 mg | INTRAVENOUS | Status: AC
Start: 2022-03-12 — End: 2022-03-12
  Administered 2022-03-12: 2.5 mg via INTRAVENOUS
  Filled 2022-03-12: qty 1

## 2022-03-12 MED ORDER — DEXMEDETOMIDINE HCL IN NACL 80 MCG/20ML IV SOLN
INTRAVENOUS | Status: AC
  Filled 2022-03-12: qty 20

## 2022-03-12 MED ORDER — SODIUM CHLORIDE 0.9 % IV BOLUS
500.0000 mL | Freq: Once | INTRAVENOUS | Status: AC
  Administered 2022-03-12: 500 mL via INTRAVENOUS

## 2022-03-12 MED ORDER — PHENYLEPHRINE HCL (PRESSORS) 10 MG/ML IV SOLN
INTRAVENOUS | Status: DC | PRN
  Administered 2022-03-12 (×2): 160 ug via INTRAVENOUS
  Administered 2022-03-12: 80 ug via INTRAVENOUS
  Administered 2022-03-12: 160 ug via INTRAVENOUS
  Administered 2022-03-12: 80 ug via INTRAVENOUS

## 2022-03-12 MED ORDER — SODIUM CHLORIDE 0.9 % IR SOLN
Status: DC | PRN
  Administered 2022-03-12: 1000 mL

## 2022-03-12 MED ORDER — PROPOFOL 10 MG/ML IV BOLUS
INTRAVENOUS | Status: DC | PRN
  Administered 2022-03-12: 180 mg via INTRAVENOUS

## 2022-03-12 MED ORDER — SODIUM CHLORIDE 0.9 % IV SOLN: INTRAVENOUS | Status: DC

## 2022-03-12 MED ORDER — HYDROXYZINE PAMOATE 25 MG PO CAPS: 25.0000 mg | ORAL_CAPSULE | Freq: Three times a day (TID) | ORAL | Status: DC | PRN

## 2022-03-12 MED ORDER — METRONIDAZOLE 500 MG/100ML IV SOLN
500.0000 mg | Freq: Two times a day (BID) | INTRAVENOUS | Status: DC
  Administered 2022-03-12 – 2022-03-15 (×6): 500 mg via INTRAVENOUS
  Filled 2022-03-12 (×7): qty 100

## 2022-03-12 MED ORDER — EPHEDRINE 5 MG/ML INJ
INTRAVENOUS | Status: AC
  Filled 2022-03-12: qty 5

## 2022-03-12 MED ORDER — KETOROLAC TROMETHAMINE 30 MG/ML IJ SOLN
30.0000 mg | Freq: Once | INTRAMUSCULAR | Status: AC
  Administered 2022-03-12: 30 mg via INTRAVENOUS
  Filled 2022-03-12: qty 1

## 2022-03-12 MED ORDER — OXYCODONE-ACETAMINOPHEN 5-325 MG PO TABS: 1.0000 | ORAL_TABLET | ORAL | Status: DC | PRN

## 2022-03-12 MED ORDER — IPRATROPIUM-ALBUTEROL 0.5-2.5 (3) MG/3ML IN SOLN: 3.0000 mL | Freq: Four times a day (QID) | RESPIRATORY_TRACT | Status: DC | PRN

## 2022-03-12 MED ORDER — HYDROMORPHONE HCL 1 MG/ML IJ SOLN
0.5000 mg | INTRAMUSCULAR | Status: AC
  Administered 2022-03-12: 0.5 mg via INTRAVENOUS
  Filled 2022-03-12: qty 0.5

## 2022-03-12 MED ORDER — FENTANYL CITRATE (PF) 100 MCG/2ML IJ SOLN
INTRAMUSCULAR | Status: AC
  Administered 2022-03-12: 50 ug via INTRAVENOUS
  Filled 2022-03-12: qty 2

## 2022-03-12 MED ORDER — HYDROMORPHONE HCL 1 MG/ML IJ SOLN
1.0000 mg | INTRAMUSCULAR | Status: AC
  Administered 2022-03-12: 1 mg via INTRAVENOUS
  Filled 2022-03-12: qty 1

## 2022-03-12 MED ORDER — ACETAMINOPHEN 10 MG/ML IV SOLN
INTRAVENOUS | Status: AC
  Filled 2022-03-12: qty 100

## 2022-03-12 MED ORDER — ACETAMINOPHEN 10 MG/ML IV SOLN
INTRAVENOUS | Status: DC | PRN
  Administered 2022-03-12: 1000 mg via INTRAVENOUS

## 2022-03-12 MED ORDER — HYDRALAZINE HCL 20 MG/ML IJ SOLN
5.0000 mg | INTRAMUSCULAR | Status: DC | PRN
  Administered 2022-03-15: 5 mg via INTRAVENOUS
  Filled 2022-03-12: qty 1

## 2022-03-12 MED ORDER — MONTELUKAST SODIUM 10 MG PO TABS
10.0000 mg | ORAL_TABLET | Freq: Every evening | ORAL | Status: DC
  Administered 2022-03-13 – 2022-03-14 (×2): 10 mg via ORAL
  Filled 2022-03-12 (×3): qty 1

## 2022-03-12 MED ORDER — PROPOFOL 10 MG/ML IV BOLUS
INTRAVENOUS | Status: AC
  Filled 2022-03-12: qty 20

## 2022-03-12 MED ORDER — KETOROLAC TROMETHAMINE 30 MG/ML IJ SOLN
INTRAMUSCULAR | Status: AC
  Filled 2022-03-12: qty 1

## 2022-03-12 MED ORDER — ACETAMINOPHEN 325 MG PO TABS: 650.0000 mg | ORAL_TABLET | Freq: Four times a day (QID) | ORAL | Status: DC | PRN

## 2022-03-12 MED ORDER — FENTANYL CITRATE (PF) 100 MCG/2ML IJ SOLN
INTRAMUSCULAR | Status: DC | PRN
  Administered 2022-03-12: 100 ug via INTRAVENOUS

## 2022-03-12 MED ORDER — FENTANYL CITRATE (PF) 100 MCG/2ML IJ SOLN
25.0000 ug | INTRAMUSCULAR | Status: DC | PRN
  Administered 2022-03-12: 25 ug via INTRAVENOUS

## 2022-03-12 MED ORDER — HYDROXYZINE HCL 10 MG PO TABS
25.0000 mg | ORAL_TABLET | Freq: Three times a day (TID) | ORAL | Status: DC | PRN
  Filled 2022-03-12: qty 3

## 2022-03-12 MED ORDER — LIDOCAINE HCL (PF) 2 % IJ SOLN
INTRAMUSCULAR | Status: AC
  Filled 2022-03-12: qty 5

## 2022-03-12 MED ORDER — CIPROFLOXACIN IN D5W 400 MG/200ML IV SOLN
400.0000 mg | Freq: Two times a day (BID) | INTRAVENOUS | Status: DC
  Administered 2022-03-12 – 2022-03-15 (×6): 400 mg via INTRAVENOUS
  Filled 2022-03-12 (×7): qty 200

## 2022-03-12 MED ORDER — DEXAMETHASONE SODIUM PHOSPHATE 10 MG/ML IJ SOLN
INTRAMUSCULAR | Status: DC | PRN
  Administered 2022-03-12: 8 mg via INTRAVENOUS

## 2022-03-12 MED ORDER — MIDAZOLAM HCL 2 MG/2ML IJ SOLN
INTRAMUSCULAR | Status: AC
Start: 2022-03-12 — End: ?
  Filled 2022-03-12: qty 2

## 2022-03-12 MED ORDER — SODIUM CHLORIDE FLUSH 0.9 % IV SOLN
INTRAVENOUS | Status: AC
  Filled 2022-03-12: qty 10

## 2022-03-12 MED ORDER — ACETAMINOPHEN 500 MG PO TABS: 1000.0000 mg | ORAL_TABLET | Freq: Four times a day (QID) | ORAL | Status: DC | PRN

## 2022-03-12 MED ORDER — UMECLIDINIUM BROMIDE 62.5 MCG/ACT IN AEPB
1.0000 | INHALATION_SPRAY | Freq: Every day | RESPIRATORY_TRACT | Status: DC
Start: 2022-03-12 — End: 2022-03-15
  Administered 2022-03-13 – 2022-03-15 (×3): 1 via RESPIRATORY_TRACT
  Filled 2022-03-12: qty 7

## 2022-03-12 MED ORDER — FENTANYL CITRATE (PF) 100 MCG/2ML IJ SOLN
INTRAMUSCULAR | Status: AC
  Filled 2022-03-12: qty 2

## 2022-03-12 MED ORDER — LEVOTHYROXINE SODIUM 50 MCG PO TABS
50.0000 ug | ORAL_TABLET | Freq: Every day | ORAL | Status: DC
  Administered 2022-03-13 – 2022-03-15 (×3): 50 ug via ORAL
  Filled 2022-03-12 (×3): qty 1

## 2022-03-12 MED ORDER — GLYCOPYRROLATE 0.2 MG/ML IJ SOLN
INTRAMUSCULAR | Status: AC
  Filled 2022-03-12: qty 1

## 2022-03-12 MED ORDER — ONDANSETRON HCL 4 MG/2ML IJ SOLN
4.0000 mg | INTRAMUSCULAR | Status: AC
  Administered 2022-03-12: 4 mg via INTRAVENOUS
  Filled 2022-03-12: qty 2

## 2022-03-12 MED ORDER — AMLODIPINE BESYLATE 10 MG PO TABS
10.0000 mg | ORAL_TABLET | Freq: Every day | ORAL | Status: DC
  Administered 2022-03-13 – 2022-03-15 (×3): 10 mg via ORAL
  Filled 2022-03-12 (×3): qty 1

## 2022-03-12 MED ORDER — BUSPIRONE HCL 10 MG PO TABS
5.0000 mg | ORAL_TABLET | Freq: Two times a day (BID) | ORAL | Status: DC
  Administered 2022-03-12: 5 mg via ORAL
  Filled 2022-03-12 (×4): qty 1

## 2022-03-12 MED ORDER — SUGAMMADEX SODIUM 200 MG/2ML IV SOLN
INTRAVENOUS | Status: DC | PRN
  Administered 2022-03-12: 200 mg via INTRAVENOUS

## 2022-03-12 SURGICAL SUPPLY — 51 items
BAG RETRIEVAL 10 (BASKET) ×1
CANNULA REDUC XI 12-8 STAPL (CANNULA) ×1
CANNULA REDUCER 12-8 DVNC XI (CANNULA) ×1 IMPLANT
CLIP LIGATING HEMO O LOK GREEN (MISCELLANEOUS) ×2 IMPLANT
DERMABOND ADVANCED (GAUZE/BANDAGES/DRESSINGS) ×1
DERMABOND ADVANCED .7 DNX12 (GAUZE/BANDAGES/DRESSINGS) ×1 IMPLANT
DRAPE ARM DVNC X/XI (DISPOSABLE) ×4 IMPLANT
DRAPE COLUMN DVNC XI (DISPOSABLE) ×1 IMPLANT
DRAPE DA VINCI XI ARM (DISPOSABLE) ×4
DRAPE DA VINCI XI COLUMN (DISPOSABLE) ×1
ELECT CAUTERY BLADE TIP 2.5 (TIP) ×2
ELECT REM PT RETURN 9FT ADLT (ELECTROSURGICAL) ×2
ELECTRODE CAUTERY BLDE TIP 2.5 (TIP) ×1 IMPLANT
ELECTRODE REM PT RTRN 9FT ADLT (ELECTROSURGICAL) ×1 IMPLANT
GLOVE SURG SYN 7.0 (GLOVE) ×4 IMPLANT
GLOVE SURG SYN 7.0 PF PI (GLOVE) ×2 IMPLANT
GLOVE SURG SYN 7.5  E (GLOVE) ×2
GLOVE SURG SYN 7.5 E (GLOVE) ×2 IMPLANT
GLOVE SURG SYN 7.5 PF PI (GLOVE) ×2 IMPLANT
GOWN STRL REUS W/ TWL LRG LVL3 (GOWN DISPOSABLE) ×4 IMPLANT
GOWN STRL REUS W/TWL LRG LVL3 (GOWN DISPOSABLE) ×3
IRRIGATION STRYKERFLOW (MISCELLANEOUS) IMPLANT
IRRIGATOR STRYKERFLOW (MISCELLANEOUS) ×2
IV NS 1000ML (IV SOLUTION) ×1
IV NS 1000ML BAXH (IV SOLUTION) IMPLANT
KIT PINK PAD W/HEAD ARE REST (MISCELLANEOUS) ×2 IMPLANT
KIT PINK PAD W/HEAD ARM REST (MISCELLANEOUS) ×1 IMPLANT
MANIFOLD NEPTUNE II (INSTRUMENTS) ×2 IMPLANT
NEEDLE HYPO 22GX1.5 SAFETY (NEEDLE) ×2 IMPLANT
NS IRRIG 500ML POUR BTL (IV SOLUTION) ×2 IMPLANT
OBTURATOR OPTICAL STANDARD 8MM (TROCAR) ×1
OBTURATOR OPTICAL STND 8 DVNC (TROCAR) ×1
OBTURATOR OPTICALSTD 8 DVNC (TROCAR) ×1 IMPLANT
PACK LAP CHOLECYSTECTOMY (MISCELLANEOUS) ×2 IMPLANT
PENCIL ELECTRO HAND CTR (MISCELLANEOUS) ×2 IMPLANT
SEAL CANN UNIV 5-8 DVNC XI (MISCELLANEOUS) ×3 IMPLANT
SEAL XI 5MM-8MM UNIVERSAL (MISCELLANEOUS) ×3
SET TUBE SMOKE EVAC HIGH FLOW (TUBING) ×2 IMPLANT
SOLUTION ELECTROLUBE (MISCELLANEOUS) ×2 IMPLANT
SPIKE FLUID TRANSFER (MISCELLANEOUS) ×1 IMPLANT
SPONGE T-LAP 18X18 ~~LOC~~+RFID (SPONGE) IMPLANT
SPONGE T-LAP 4X18 ~~LOC~~+RFID (SPONGE) ×1 IMPLANT
STAPLER CANNULA SEAL DVNC XI (STAPLE) ×1 IMPLANT
STAPLER CANNULA SEAL XI (STAPLE) ×1
SUT MNCRL AB 4-0 PS2 18 (SUTURE) ×2 IMPLANT
SUT VIC AB 3-0 SH 27 (SUTURE)
SUT VIC AB 3-0 SH 27X BRD (SUTURE) IMPLANT
SUT VICRYL 0 AB UR-6 (SUTURE) ×4 IMPLANT
SYS BAG RETRIEVAL 10MM (BASKET) ×1
SYSTEM BAG RETRIEVAL 10MM (BASKET) ×1 IMPLANT
WATER STERILE IRR 500ML POUR (IV SOLUTION) ×2 IMPLANT

## 2022-03-12 NOTE — ED Provider Notes (Signed)
[facility] Provider Note   Event Date/Time   First MD Initiated Contact with Patient 03/12/22 [phone]     (approximate)  History   Abdominal Pain  HPI  Catherine Giles is a 51 y.o. female history of hypertension, diabetes emphysema Hashimoto's obesity.  Review of primary care note from April of this year reveals history of rheumatoid arthritis and also alpha gal allergy   Patient reports she ate chicken last night and then sometime around 10 PM started having severe very crampy all over and sharp pains across her whole stomach.  She felt nauseated she self-induced vomiting to see if it would help and it did not.  She has also had loose stools this evening, and has also had several loose stools for the last 2 months.  She reports she was diagnosed with alpha gal, and has been trying to avoid things that exacerbate it and.  She has not had a fever.  Denies pregnancy and reports she has been through menopause.  No pain or burning with urination.  No chest pain or trouble breathing.  Presently feels nauseated and reports severe all over moving cramping abdominal pain.  Physical Exam   Triage Vital Signs: ED Triage Vitals [03/12/22 0629]  Enc Vitals Group     BP (!) 171/89     Pulse Rate 91     Resp (!) 22     Temp 97.9 F (36.6 C)     Temp Source Oral     SpO2 93 %     Weight 160 lb (72.6 kg)     Height '5\' 5"'$  (1.651 m)     Head Circumference      Peak Flow      Pain Score 10     Pain Loc      Pain Edu?      Excl. in [place]?     Most recent vital signs: Vitals:   03/12/22 1200 03/12/22 1317  BP: 126/73 (!) 159/97  Pulse: 88 87  Resp: 16 18  Temp: 98 F (36.7 C) 98.5 F (36.9 C)  SpO2: 94% 94%     General: Awake, no distress.  She is rolling about in the bed from side to side and holding her stomach reporting that it hurts all over very sharp pains CV:  Good peripheral perfusion.  Normal heart tones.  Well-perfused. Resp:  Normal effort.  Clear  bilateral.  Speaks in full sentences Abd:  No distention.  He reports tenderness throughout, moderate tenderness does not appear to be focal, but she does exhibit some rebound tenderness in all quadrants.  I am unable to identify a single focus of pain but she does appear notable abdominal pain, and quite tender on exam. Other:  No lower extremity edema.  Moves all extremities well.  Fully alert   ED Results / Procedures / Treatments   Labs (all labs ordered are listed, but only abnormal results are displayed) Labs Reviewed  COMPREHENSIVE METABOLIC PANEL - Abnormal; Notable for the following components:      Result Value   Glucose, Bld 185 (*)    All other components within normal limits  CBC - Abnormal; Notable for the following components:   WBC 11.0 (*)    All other components within normal limits  CULTURE, BLOOD (ROUTINE X 2)  CULTURE, BLOOD (ROUTINE X 2)  C DIFFICILE QUICK SCREEN W PCR REFLEX    GASTROINTESTINAL PANEL BY PCR, STOOL (REPLACES STOOL CULTURE)  LIPASE, BLOOD  BRAIN NATRIURETIC PEPTIDE  PROTIME-INR  APTT  LACTIC ACID, PLASMA  URINALYSIS, ROUTINE W REFLEX MICROSCOPIC  HIV ANTIBODY (ROUTINE TESTING W REFLEX)  LACTIC ACID, PLASMA  PROCALCITONIN  TYPE AND SCREEN  TROPONIN I (HIGH SENSITIVITY)     EKG  Compared with previous EKG from November 22, 2021, see no significant changes  Today's EKG time 630 interpreted by me heart rate 90 QRS 100 QTc 480 Sinus rhythm, probable LVH, mild nonspecific T wave abnormality in inferior and slight lateral precordial leads.   RADIOLOGY   CT ABDOMEN PELVIS W CONTRAST  Result Date: 03/12/2022 CLINICAL DATA:  51 year old female with history of acute onset of nonlocalized abdominal pain. EXAM: CT ABDOMEN AND PELVIS WITH CONTRAST TECHNIQUE: Multidetector CT imaging of the abdomen and pelvis was performed using the standard protocol following bolus administration of intravenous contrast. RADIATION DOSE REDUCTION: This exam was  performed according to the departmental dose-optimization program which includes automated exposure control, adjustment of the mA and/or kV according to patient size and/or use of iterative reconstruction technique. CONTRAST:  168m OMNIPAQUE IOHEXOL 300 MG/ML  SOLN COMPARISON:  No priors. FINDINGS: Lower chest: Patchy areas of ground-glass attenuation, mild septal thickening and architectural distortion are noted throughout the lung bases bilaterally, similar to prior chest CT 04/29/2020, presumably chronic post infectious or inflammatory scarring. Hepatobiliary: No suspicious cystic or solid hepatic lesions. Diffuse low attenuation throughout the hepatic parenchyma, indicative of a background of hepatic steatosis. In the neck of the gallbladder there is a peripherally calcified gallstone measuring 1.8 cm in diameter. Gallbladder is moderately distended. Gallbladder wall does not appear thickened. No pericholecystic fluid or surrounding inflammatory changes. Pancreas: No pancreatic mass. No pancreatic ductal dilatation. No pancreatic or peripancreatic fluid collections or inflammatory changes. Spleen: Unremarkable. Adrenals/Urinary Tract: Multiple low-attenuation lesions in both kidneys, largest of which are compatible with simple cysts, measuring up to 1.2 cm in the lower pole of the right kidney. Other subcentimeter low-attenuation lesions are too small to definitively characterize, but statistically likely tiny cysts (no imaging follow-up is recommended). No hydroureteronephrosis. Urinary bladder is normal in appearance. Bilateral adrenal glands are normal in appearance. Stomach/Bowel: The appearance of the stomach is normal. There is no pathologic dilatation of small bowel or colon. Normal appendix. Vascular/Lymphatic: No significant atherosclerotic disease, aneurysm or dissection noted in the abdominal or pelvic vasculature. No lymphadenopathy noted in the abdomen or pelvis. Reproductive: Uterus and ovaries  are unremarkable in appearance. Other: No significant volume of ascites.  No pneumoperitoneum. Musculoskeletal: There are no aggressive appearing lytic or blastic lesions noted in the visualized portions of the skeleton. IMPRESSION: 1. There is a calcified gallstone in the neck of the gallbladder. Gallbladder is moderately distended, however, there is no gallbladder wall thickening, pericholecystic fluid or surrounding inflammatory changes to suggest an acute cholecystitis at this time. If there is clinical concern for cholecystitis, further evaluation with right upper quadrant abdominal ultrasound should be considered. 2. Additional incidental findings, as above. Electronically Signed   By: DVinnie LangtonM.D.   On: 03/12/2022 08:27   [place] RUQ (LIVER/GB)  Result Date: 03/12/2022 CLINICAL DATA:  Upper abdominal pain EXAM: ULTRASOUND ABDOMEN LIMITED RIGHT UPPER QUADRANT COMPARISON:  CT done earlier today FINDINGS: Gallbladder: There is 1.3 cm calculus in the neck of the gallbladder. There is no demonstrable intraluminal mobility of this calculus during the study. There is no fluid around the gallbladder. Technologist did not observe any focal tenderness over the gallbladder. Common bile duct: Diameter: 5 mm. Liver: There  is slightly increased echogenicity in the liver. No focal abnormality is seen. Portal vein is patent on color Doppler imaging with normal direction of blood flow towards the liver. Other: None. IMPRESSION: There is 1.3 cm calculus which appears to be impacted in the neck of the gallbladder. There are no imaging signs of acute cholecystitis. There is no significant dilation of bile ducts. Mild fatty infiltration is seen in the liver. Electronically Signed   By: Elmer Picker M.D.   On: 03/12/2022 09:08       PROCEDURES:  Critical Care performed: No  Procedures   MEDICATIONS ORDERED IN ED: Medications  ondansetron (ZOFRAN) injection 4 mg (has no administration in  time range)  acetaminophen (TYLENOL) tablet 650 mg (has no administration in time range)  hydrALAZINE (APRESOLINE) injection 5 mg (has no administration in time range)  HYDROmorphone (DILAUDID) injection 1 mg (1 mg Intravenous Given 03/12/22 1347)  oxyCODONE-acetaminophen (PERCOCET/ROXICET) 5-325 MG per tablet 1 tablet (has no administration in time range)  0.9 %  sodium chloride infusion ( Intravenous New Bag/Given 03/12/22 1343)  hydrocortisone sodium succinate (SOLU-CORTEF) 100 MG injection 100 mg (100 mg Intravenous Given 03/12/22 1338)  ciprofloxacin (CIPRO) IVPB 400 mg (400 mg Intravenous New Bag/Given 03/12/22 1502)  metroNIDAZOLE (FLAGYL) IVPB 500 mg (has no administration in time range)  insulin aspart (novoLOG) injection 0-9 Units (has no administration in time range)  insulin aspart (novoLOG) injection 0-5 Units (has no administration in time range)  nicotine (NICODERM CQ - dosed in mg/24 hours) patch 21 mg (21 mg Transdermal Patch Applied 03/12/22 1338)  indocyanine green (IC-GREEN) injection 2.5 mg (has no administration in time range)  ibuprofen (ADVIL) tablet 600 mg (has no administration in time range)  leflunomide (ARAVA) tablet 20 mg (has no administration in time range)  amLODipine (NORVASC) tablet 10 mg (has no administration in time range)  cloNIDine (CATAPRES) tablet 0.1 mg (has no administration in time range)  busPIRone (BUSPAR) tablet 5 mg (has no administration in time range)  DULoxetine (CYMBALTA) DR capsule 30 mg (has no administration in time range)  hydrOXYzine (VISTARIL) capsule 25 mg (has no administration in time range)  levothyroxine (SYNTHROID) tablet 50 mcg (has no administration in time range)  famotidine (PEPCID) tablet 20 mg (has no administration in time range)  loratadine (CLARITIN) tablet 10 mg (has no administration in time range)  fluticasone furoate-vilanterol (BREO ELLIPTA) 100-25 MCG/ACT 1 puff (has no administration in time range)    And   umeclidinium bromide (INCRUSE ELLIPTA) 62.5 MCG/ACT 1 puff (has no administration in time range)  ipratropium-albuterol (DUONEB) 0.5-2.5 (3) MG/3ML nebulizer solution 3 mL (has no administration in time range)  montelukast (SINGULAIR) tablet 10 mg (has no administration in time range)  HYDROmorphone (DILAUDID) injection 1 mg (1 mg Intravenous Given 03/12/22 0740)  ondansetron (ZOFRAN) injection 4 mg (4 mg Intravenous Given 03/12/22 0741)  sodium chloride 0.9 % bolus 500 mL (0 mLs Intravenous Stopped 03/12/22 1012)  iohexol (OMNIPAQUE) 300 MG/ML solution 100 mL (100 mLs Intravenous Contrast Given 03/12/22 0812)  HYDROmorphone (DILAUDID) injection 0.5 mg (0.5 mg Intravenous Given 03/12/22 1010)  ketorolac (TORADOL) 30 MG/ML injection 30 mg (30 mg Intravenous Given 03/12/22 1010)     IMPRESSION / MDM / ASSESSMENT AND PLAN / ED COURSE  I reviewed the triage vital signs and the nursing notes.  Differential diagnosis includes but is not limited to, abdominal perforation, aortic dissection, cholecystitis, appendicitis, diverticulitis, colitis, esophagitis/gastritis, kidney stone, pyelonephritis, urinary tract infection, aortic aneurysm. All are considered in decision and treatment plan. Based upon the patient's presentation and risk factors, will proceed by providing pain relief and antiemetic.  Also plan to obtain CT imaging as she reports somewhat diffuse abdominal pain.  She does report however that this started not long after eating chicken, and has alpha-gal, but I would not expect chicken and necessarily exacerbate this.  Patient also reports she had loose stools more loose than normal and feels like she had eaten "bad food".  She is hemodynamically stable somewhat hypertensive but also in pain.  Proceed with CT imaging to further evaluate.  She denies any acute cardiac neurologic or pulmonary symptoms.  Clinical signs and symptoms do not seem consistent with acute vascular  emergency   The patient is on the cardiac monitor to evaluate for evidence of arrhythmia and/or significant heart rate changes.  Also maintained on pulse oximetry at all times as patient receiving narcotic medication   Clinical Course as of 03/12/22 1531  Sun Mar 12, 2022  0818 Labs reviewed notable for normal comprehensive metabolic panel.  Normal lipase and liver enzymes.  CBC with just slight leukocytosis.  Troponin normal [MQ]  0820 I personally interpreted the patient's CT abdomen pelvis for acute gross pathology.  On my review there is a fluid-filled gallbladder suspicious for cholelithiasis.  I have ordered right upper quadrant ultrasound.  Await final read by radiologist on CT scan as well [MQ]  1022 Patient slightly more sleepy now, but still reports pain.  She is in no acute distress but still reports pain to palpation in the upper abdomen.  Have notified Dr. Hampton Abbot, he will provide consultation [MQ]    Clinical Course User Index [MQ] Delman Kitten, MD   ----------------------------------------- 9:33 AM on 03/12/2022 ----------------------------------------- Patient continues to have pain on reexam seems slightly more localized now to the right upper quadrant.  Suspect symptomatic cholelithiasis.  Discussed with Dr. Hampton Abbot and he will provide consultation, also giving additional medication including hydromorphone and Toradol.  Patient does appear somewhat more comfortable still reports moderate to severe pain mostly in the upper abdomen at this time.  Surgery, Dr. Hampton Abbot, has seen consulted and advised that he has discussed with the hospitalist and will have the hospitalist service admit with surgical consultation following  FINAL CLINICAL IMPRESSION(S) / ED DIAGNOSES   Final diagnoses:  Symptomatic cholelithiasis     Rx / DC Orders   ED Discharge Orders     None        Note:  This document was prepared using Dragon voice recognition software and may include  unintentional dictation errors.   Delman Kitten, MD 03/12/22 1531

## 2022-03-12 NOTE — Assessment & Plan Note (Signed)
-   IV hydralazine as needed -Hold HCTZ as patient need IV fluid -Continue clonidine, amlodipine

## 2022-03-12 NOTE — Assessment & Plan Note (Signed)
2D echo on 04/30/2020 showed EF 60 to 65% with grade 1 diastolic dysfunction.  BNP 23.5 today. Patient does not have leg edema JVD, CHF seems compensated.   -Watch volume status closely

## 2022-03-12 NOTE — Anesthesia Procedure Notes (Signed)
Procedure Name: Intubation Date/Time: 03/12/2022 4:20 PM Performed by: Hedda Slade, CRNA Pre-anesthesia Checklist: Patient identified, Patient being monitored, Timeout performed, Emergency Drugs available and Suction available Patient Re-evaluated:Patient Re-evaluated prior to induction Oxygen Delivery Method: Circle system utilized Preoxygenation: Pre-oxygenation with 100% oxygen Induction Type: IV induction Ventilation: Mask ventilation without difficulty and Oral airway inserted - appropriate to patient size Laryngoscope Size: McGraph and 3 Grade View: Grade I Tube type: Oral Tube size: 7.0 mm Number of attempts: 1 Airway Equipment and Method: Stylet Placement Confirmation: ETT inserted through vocal cords under direct vision, positive ETCO2 and breath sounds checked- equal and bilateral Secured at: 21 cm Tube secured with: Tape Dental Injury: Teeth and Oropharynx as per pre-operative assessment

## 2022-03-12 NOTE — Plan of Care (Signed)
  Problem: Education: Goal: Knowledge of General Education information will improve Description Including pain rating scale, medication(s)/side effects and non-pharmacologic comfort measures Outcome: Progressing   

## 2022-03-12 NOTE — Assessment & Plan Note (Signed)
Synthroid 

## 2022-03-12 NOTE — Assessment & Plan Note (Addendum)
-   C. difficile and GI pathogen panel negative.

## 2022-03-12 NOTE — Assessment & Plan Note (Addendum)
Recent A1c 6.7.  Patient is taking Januvia, Farxiga, well controlled -Sliding scale insulin -Add Semglee 5 units twice daily

## 2022-03-12 NOTE — ED Triage Notes (Signed)
Pt with generalized abd pain that began at 2200 last pm. Pt states does have nausea.

## 2022-03-12 NOTE — Assessment & Plan Note (Signed)
-  Nicotine patch 

## 2022-03-12 NOTE — Assessment & Plan Note (Signed)
Stable -Bronchodilators 

## 2022-03-12 NOTE — Transfer of Care (Signed)
Immediate Anesthesia Transfer of Care Note  Patient: Catherine Giles  Procedure(s) Performed: XI ROBOTIC ASSISTED LAPAROSCOPIC CHOLECYSTECTOMY  Patient Location: PACU  Anesthesia Type:General  Level of Consciousness: awake, alert  and oriented  Airway & Oxygen Therapy: Patient Spontanous Breathing  Post-op Assessment: Report given to RN and Post -op Vital signs reviewed and stable  Post vital signs: Reviewed and stable  Last Vitals:  Vitals Value Taken Time  BP 128/72   Temp    Pulse 88   Resp 22   SpO2 95 % 03/12/22 1815  Vitals shown include unvalidated device data.  Last Pain:  Vitals:   03/12/22 1200  TempSrc: Oral  PainSc: 8          Complications: No notable events documented.

## 2022-03-12 NOTE — Assessment & Plan Note (Signed)
See above

## 2022-03-12 NOTE — Consult Note (Signed)
Date of Consultation:  03/12/2022  Requesting Physician:  Delman Kitten, MD  Reason for Consultation:  Abdominal pain  History of Present Illness: Catherine Giles is a 51 y.o. female presenting with acute onset of abdominal pain that started last night around 10-10:30 pm.  The patient was woken up from sleep due to the pain.  This was associated with nausea and she forced emesis twice without improvement.  She had jerk chicken last night for dinner and initially thought she had gotten a stomach virus.  However, her pain continued and worsened today.  She called EMS and was brought to the ED.  En route, she got Aspirin 81 mg x 4 tablets.  Her workup in the ED showed overall unremarkable labs with WBC of 11, Hgb 14.1, normal LFTs, Cr 0.52, and Troponin 5.  She had a CT scan of abdomen/pelvis first which did not show any significant findings except for cholelithiasis with a 1.8 cm gallstone in the neck of the gallbladder and bilateral renal cysts.  U/S of the RUQ confirmed a non-mobile stone.  There was no significant gallbladder wall thickening or pericholecystic fluid.  Despite of pain medications, she reports her pain is quite persistent.  She reports the pain is significant enough at times that makes it harder to take a deep breath.  Of note, she also has a significant medical history.  She has rheumatoid arthritis and has Simponi Aria infusions, a daily DMARD, and prednisone.  She also has HTN, DM, COPD (also on prednisone for that), fibromyalgia, thyroid disease.  Denies any abdominal surgeries.  Also of note, and unclear if related, she reports that over the past two months she has noted that any food she takes goes through her very fast and has diarrhea.  Past Medical History: Past Medical History:  Diagnosis Date   Arthritis    RA   Asthma    COPD (chronic obstructive pulmonary disease) ([place])    Depression    Diabetes mellitus without complication ([place])    Emphysema of lung ([employer])    Family  history of breast cancer    6/21 cancer genetic testing letter sent   Fibromyalgia    Hypertension    Lupus ([place])    Pre-diabetes    Thyroid disease      Past Surgical History: Past Surgical History:  Procedure Laterality Date   MOUTH SURGERY     TRANSTHORACIC ECHOCARDIOGRAM  04/2020   EF 60 to 65%.  Normal LV function.  No R WMA.  GR 1 DD.  Normal RV size and function.  Normal PAP.  Normal atrial size.  Normal valves.    Home Medications: Prior to Admission medications   Medication Sig Start Date End Date Taking? Authorizing Provider  Acidophilus Lactobacillus CAPS Take 1 tablet by mouth daily.    [provider]  albuterol (PROVENTIL) (2.5 MG/3ML) 0.083% nebulizer solution Take 3 mLs (2.5 mg total) by nebulization every 6 (six) hours as needed for wheezing or shortness of breath. 07/25/21   Coral Spikes, DO  albuterol (VENTOLIN HFA) 108 (90 Base) MCG/ACT inhaler Inhale 2 puffs into the lungs every 4 (four) hours as needed.  02/12/19 11/22/21  [provider]  amLODipine (NORVASC) 10 MG tablet Take 1 tablet (10 mg total) by mouth daily. 09/05/21   Marnee Guarneri T, NP  aspirin 81 MG EC tablet Take 1 tablet (81 mg total) by mouth daily. Swallow whole. 11/09/21   Charlynne Cousins, MD  Blood Glucose Monitoring Suppl [facility]  VERIO) w/Device KIT Use to check blood sugar 3 times daily with goal <130 fasting and <180 two hours after meal.  Bring meter to visits. 01/25/21   Cannady, Henrine Screws T, NP  busPIRone (BUSPAR) 5 MG tablet Take 1 tablet (5 mg total) by mouth 2 (two) times daily. 09/05/21   Cannady, Henrine Screws T, NP  cetirizine (ZYRTEC) 10 MG tablet Take 1 tablet (10 mg total) by mouth daily. 01/03/22   Kathrine Haddock, NP  cloNIDine (CATAPRES) 0.1 MG tablet Take 1 tablet (0.1 mg total) by mouth 2 (two) times daily. 11/22/21   Minna Merritts, MD  cyproheptadine (PERIACTIN) 4 MG tablet Take 4 mg by mouth 3 (three) times daily.    [provider]  dapagliflozin propanediol  (FARXIGA) 10 MG TABS tablet Take 1 tablet (10 mg total) by mouth daily before breakfast. 03/16/21   Cannady, Henrine Screws T, NP  DULoxetine (CYMBALTA) 30 MG capsule Take 30 mg by mouth 2 (two) times daily. 10/25/21   [provider]  EPINEPHrine 0.3 mg/0.3 mL IJ SOAJ injection Inject 0.3 mg into the muscle as needed for anaphylaxis. 02/15/22   Cannady, Henrine Screws T, NP  fluticasone (FLONASE) 50 MCG/ACT nasal spray Place 2 sprays into both nostrils daily. 07/01/20   Rodriguez-Southworth, Sunday Spillers, PA-C  Fluticasone-Umeclidin-Vilant (TRELEGY ELLIPTA) 100-62.5-25 MCG/INH AEPB Inhale 1 puff into the lungs daily. 11/29/20   [provider]  glucose blood test strip Use to check blood sugar 3 times daily with goal <130 fasting and <180 two hours after meal. 01/25/21   Cannady, Jolene T, NP  golimumab (SIMPONI ARIA) 50 MG/4ML SOLN injection Inject into the vein.    [provider]  hydrochlorothiazide (HYDRODIURIL) 25 MG tablet Take 1 tablet (25 mg total) by mouth daily. 11/22/21   Minna Merritts, MD  hydrOXYzine (VISTARIL) 25 MG capsule TAKE 1 CAPSULE BY MOUTH EVERY 8 HOURS ASNEEDED 01/03/22   Kathrine Haddock, NP  ibuprofen (ADVIL) 400 MG tablet Take 1 tablet (400 mg total) by mouth every 8 (eight) hours as needed. 07/07/21   Cannady, Henrine Screws T, NP  ipratropium-albuterol (DUONEB) 0.5-2.5 (3) MG/3ML SOLN Take 3 mLs by nebulization every 6 (six) hours as needed for up to 7 days. 10/30/21 11/22/21  Laurene Footman B, PA-C  Lactobacillus PACK Take 1 each by mouth as needed (for diarrhea.). 02/28/21   Charlynne Cousins, MD  Lancets (ONETOUCH ULTRASOFT) lancets Use to check blood sugar 3 times daily with goal <130 fasting and <180 two hours after meal. 01/25/21   Cannady, Jolene T, NP  leflunomide (ARAVA) 20 MG tablet Take 20 mg by mouth daily. 01/22/21   [provider]  levothyroxine (SYNTHROID) 50 MCG tablet Take 50 mcg by mouth daily before breakfast.     [provider]  liothyronine (CYTOMEL) 5 MCG  tablet Take 5 mcg by mouth daily. 08/05/20   [provider]  losartan (COZAAR) 100 MG tablet Take 1 tablet (100 mg total) by mouth daily. 06/02/21   McElwee, Scheryl Darter, NP  predniSONE (DELTASONE) 10 MG tablet Take 6 tablets by mouth daily for 2 days, then reduce by 1 tablet every 2 days until gone 01/03/22   Kathrine Haddock, NP  sitaGLIPtin (JANUVIA) 100 MG tablet Take 1 tablet (100 mg total) by mouth daily. 12/02/21   Cannady, Henrine Screws T, NP  tiZANidine (ZANAFLEX) 4 MG tablet Take 1 tablet (4 mg total) by mouth every 8 (eight) hours as needed for muscle spasms. 08/23/20   Eulogio Bear, NP  traZODone ([place])  50 MG tablet Take 1 tablet (50 mg total) by mouth at bedtime. 01/05/20 07/01/20  Venita Lick, NP    Allergies: Allergies  Allergen Reactions   Azithromycin Anaphylaxis   Molds & Smuts Cough   Alpha-Gal    Penicillins     Did it involve swelling of the face/tongue/throat, SOB, or low BP? Yes Did it involve sudden or severe rash/hives, skin peeling, or any reaction on the inside of your mouth or nose? No Did you need to seek medical attention at a hospital or doctor's office? No When did it last happen?       If all above answers are "NO", may proceed with cephalosporin use.    Shellfish Allergy     Social History:  reports that she has been smoking cigarettes. She has a 34.00 pack-year smoking history. She has never used smokeless tobacco. She reports current alcohol use. She reports that she does not currently use drugs.   Family History: Family History  Problem Relation Age of Onset   Lung cancer Mother    Diabetes Mother    Breast cancer Mother 44   Hypertension Father    Cancer Maternal Aunt    Ovarian cancer Maternal Aunt    Diabetes Maternal Aunt    Diabetes Maternal Uncle    Hypertension Maternal Grandmother    Diabetes Maternal Grandmother     Review of Systems: Review of Systems  Constitutional:  Negative for chills and fever.  HENT:  Negative  for hearing loss.   Respiratory:  Positive for shortness of breath.   Cardiovascular:  Negative for chest pain.  Gastrointestinal:  Positive for abdominal pain, nausea and vomiting. Negative for constipation and diarrhea.  Genitourinary:  Negative for dysuria.  Musculoskeletal:  Negative for myalgias.  Skin:  Negative for rash.  Neurological:  Negative for dizziness.  Psychiatric/Behavioral:  Negative for depression.    Physical Exam BP 105/86   Pulse 89   Temp 97.9 F (36.6 C) (Oral)   Resp 16   Ht 5' 5"  (1.651 m)   Wt 72.6 kg   SpO2 93%   BMI 26.63 kg/m  CONSTITUTIONAL: No acute distress, sleepy. HEENT:  Normocephalic, atraumatic, extraocular motion intact. NECK: Trachea is midline, and there is no jugular venous distension. RESPIRATORY:  Normal respiratory effort without pathologic use of accessory muscles. CARDIOVASCULAR:  Regular rhythm and rate. GI: The abdomen is soft, non-distended, with significant tenderness localized to the RUQ and epigastric area, with positive Murphy's sign.  MUSCULOSKELETAL:  Normal muscle strength and tone in all four extremities.  No peripheral edema or cyanosis. SKIN: Skin turgor is normal. There are no pathologic skin lesions.  NEUROLOGIC:  Motor and sensation is grossly normal.  Cranial nerves are grossly intact. PSYCH:  Alert and oriented to person, place and time. Affect is normal.  Laboratory Analysis: Results for orders placed or performed during the hospital encounter of 03/12/22 (from the past 24 hour(s))  Lipase, blood     Status: None   Collection Time: 03/12/22  6:28 AM  Result Value Ref Range   Lipase 25 11 - 51 U/L  Comprehensive metabolic panel     Status: Abnormal   Collection Time: 03/12/22  6:28 AM  Result Value Ref Range   Sodium 142 135 - 145 mmol/L   Potassium 3.7 3.5 - 5.1 mmol/L   Chloride 109 98 - 111 mmol/L   CO2 24 22 - 32 mmol/L   Glucose, Bld 185 (H) 70 - 99 mg/dL  BUN 18 6 - 20 mg/dL   Creatinine, Ser 0.52  0.44 - 1.00 mg/dL   Calcium 9.2 8.9 - 10.3 mg/dL   Total Protein 7.8 6.5 - 8.1 g/dL   Albumin 4.2 3.5 - 5.0 g/dL   AST 24 15 - 41 U/L   ALT 23 0 - 44 U/L   Alkaline Phosphatase 65 38 - 126 U/L   Total Bilirubin 0.5 0.3 - 1.2 mg/dL   GFR, Estimated >60 >60 mL/min   Anion gap 9 5 - 15  CBC     Status: Abnormal   Collection Time: 03/12/22  6:28 AM  Result Value Ref Range   WBC 11.0 (H) 4.0 - 10.5 K/uL   RBC 4.62 3.87 - 5.11 MIL/uL   Hemoglobin 14.1 12.0 - 15.0 g/dL   HCT 43.7 36.0 - 46.0 %   MCV 94.6 80.0 - 100.0 fL   MCH 30.5 26.0 - 34.0 pg   MCHC 32.3 30.0 - 36.0 g/dL   RDW 14.2 11.5 - 15.5 %   Platelets 376 150 - 400 K/uL   nRBC 0.0 0.0 - 0.2 %  Troponin I (High Sensitivity)     Status: None   Collection Time: 03/12/22  6:28 AM  Result Value Ref Range   Troponin I (High Sensitivity) 5 <18 ng/L    Imaging: CT ABDOMEN PELVIS W CONTRAST  Result Date: 03/12/2022 CLINICAL DATA:  51 year old female with history of acute onset of nonlocalized abdominal pain. EXAM: CT ABDOMEN AND PELVIS WITH CONTRAST TECHNIQUE: Multidetector CT imaging of the abdomen and pelvis was performed using the standard protocol following bolus administration of intravenous contrast. RADIATION DOSE REDUCTION: This exam was performed according to the departmental dose-optimization program which includes automated exposure control, adjustment of the mA and/or kV according to patient size and/or use of iterative reconstruction technique. CONTRAST:  111mL OMNIPAQUE IOHEXOL 300 MG/ML  SOLN COMPARISON:  No priors. FINDINGS: Lower chest: Patchy areas of ground-glass attenuation, mild septal thickening and architectural distortion are noted throughout the lung bases bilaterally, similar to prior chest CT 04/29/2020, presumably chronic post infectious or inflammatory scarring. Hepatobiliary: No suspicious cystic or solid hepatic lesions. Diffuse low attenuation throughout the hepatic parenchyma, indicative of a background of  hepatic steatosis. In the neck of the gallbladder there is a peripherally calcified gallstone measuring 1.8 cm in diameter. Gallbladder is moderately distended. Gallbladder wall does not appear thickened. No pericholecystic fluid or surrounding inflammatory changes. Pancreas: No pancreatic mass. No pancreatic ductal dilatation. No pancreatic or peripancreatic fluid collections or inflammatory changes. Spleen: Unremarkable. Adrenals/Urinary Tract: Multiple low-attenuation lesions in both kidneys, largest of which are compatible with simple cysts, measuring up to 1.2 cm in the lower pole of the right kidney. Other subcentimeter low-attenuation lesions are too small to definitively characterize, but statistically likely tiny cysts (no imaging follow-up is recommended). No hydroureteronephrosis. Urinary bladder is normal in appearance. Bilateral adrenal glands are normal in appearance. Stomach/Bowel: The appearance of the stomach is normal. There is no pathologic dilatation of small bowel or colon. Normal appendix. Vascular/Lymphatic: No significant atherosclerotic disease, aneurysm or dissection noted in the abdominal or pelvic vasculature. No lymphadenopathy noted in the abdomen or pelvis. Reproductive: Uterus and ovaries are unremarkable in appearance. Other: No significant volume of ascites.  No pneumoperitoneum. Musculoskeletal: There are no aggressive appearing lytic or blastic lesions noted in the visualized portions of the skeleton. IMPRESSION: 1. There is a calcified gallstone in the neck of the gallbladder. Gallbladder is moderately distended, however, there is no  gallbladder wall thickening, pericholecystic fluid or surrounding inflammatory changes to suggest an acute cholecystitis at this time. If there is clinical concern for cholecystitis, further evaluation with right upper quadrant abdominal ultrasound should be considered. 2. Additional incidental findings, as above. Electronically Signed   By: Vinnie Langton M.D.   On: 03/12/2022 08:27   US ABDOMEN LIMITED RUQ (LIVER/GB)  Result Date: 03/12/2022 CLINICAL DATA:  Upper abdominal pain EXAM: ULTRASOUND ABDOMEN LIMITED RIGHT UPPER QUADRANT COMPARISON:  CT done earlier today FINDINGS: Gallbladder: There is 1.3 cm calculus in the neck of the gallbladder. There is no demonstrable intraluminal mobility of this calculus during the study. There is no fluid around the gallbladder. Technologist did not observe any focal tenderness over the gallbladder. Common bile duct: Diameter: 5 mm. Liver: There is slightly increased echogenicity in the liver. No focal abnormality is seen. Portal vein is patent on color Doppler imaging with normal direction of blood flow towards the liver. Other: None. IMPRESSION: There is 1.3 cm calculus which appears to be impacted in the neck of the gallbladder. There are no imaging signs of acute cholecystitis. There is no significant dilation of bile ducts. Mild fatty infiltration is seen in the liver. Electronically Signed   By: Elmer Picker M.D.   On: 03/12/2022 09:08    Assessment and Plan: This is a 51 y.o. female with clinical acute cholecystitis.  --Discussed with the patient the findings on her labs and imaging studies.  I have personally viewed them and agree with the findings.  Although on imaging there is no true evidence of cholecystitis such as wall thickening or pericholecystic fluid, clinically she is behaving like acute cholecystitis.  It may be the immunosuppressant medications that she's on that are not allowing for an appropriate immune response, or it could be early enough in the process where no significant changes are seen yet. --Discussed with her the potential options for either cholecystectomy or percutaneous cholecystostomy drainage.  Her immunosuppressant medications do put her at risk of infection risk or wound healing issues, which would be a potential reason for doing a drain instead, but I do not know  that there's any possibility of coming off them in the future to do surgery later either.  If there's any gangrene, a drain would not be helpful either.  After discussing with my partners, we agree that proceeding with cholecystectomy is the better choice.  I have discussed with the patient the plan for a robotic assisted cholecystectomy and explained to her the risks of bleeding, infection, injury to surrounding structures, the use of ICG to evaluate/identify the biliary anatomy, possibility of a surgical drain, and she's willing to proceed. --I have also discussed with Dr. Blaine Hamper who has agreed to admit the patient due to all her chronic medical conditions.  For now, we'll keep her NPO, continue IV abx.  After surgery will start a diet. --Will take her to OR today pending anesthesia team availability.  I spent 80 minutes dedicated to the care of this patient on the date of this encounter to include pre-visit review of records, face-to-face time with the patient discussing diagnosis and management, and any post-visit coordination of care.   Melvyn Neth, MD [facility] Pg:  [phone]

## 2022-03-12 NOTE — Assessment & Plan Note (Signed)
-   Continue home medications 

## 2022-03-12 NOTE — Anesthesia Preprocedure Evaluation (Addendum)
Anesthesia Evaluation  Patient identified by MRN, date of birth, ID band Patient awake    Reviewed: Allergy & Precautions, H&P , NPO status , Patient's Chart, lab work & pertinent test results, reviewed documented beta blocker date and time   History of Anesthesia Complications Negative for: history of anesthetic complications  Airway Mallampati: II  TM Distance: >3 FB Neck ROM: full    Dental  (+) Dental Advidsory Given, Caps, Poor Dentition   Pulmonary shortness of breath and with exertion, asthma , COPD,  COPD inhaler, neg recent URI, Current Smoker,    Pulmonary exam normal breath sounds clear to auscultation       Cardiovascular Exercise Tolerance: Good hypertension, (-) angina+CHF  (-) Past MI and (-) Cardiac Stents Normal cardiovascular exam(-) dysrhythmias (-) Valvular Problems/Murmurs Rhythm:regular Rate:Normal     Neuro/Psych neg Seizures PSYCHIATRIC DISORDERS Anxiety Depression  Neuromuscular disease (fibromyalgia)    GI/Hepatic negative GI ROS, Neg liver ROS,   Endo/Other  diabetesHypothyroidism   Renal/GU negative Renal ROS  negative genitourinary   Musculoskeletal   Abdominal   Peds  Hematology negative hematology ROS (+)   Anesthesia Other Findings Past Medical History: No date: Arthritis     Comment:  RA No date: Asthma No date: COPD (chronic obstructive pulmonary disease) ([facility]) No date: Depression No date: Diabetes mellitus without complication ([facility]) No date: Emphysema of lung ([facility]) No date: Family history of breast cancer     Comment:  6/21 cancer genetic testing letter sent No date: Fibromyalgia No date: Hypertension No date: Lupus ([place]) No date: Pre-diabetes No date: Thyroid disease  Patient appears to be intoxicated on some medication, but unknown what.  It may just be pain medication from the hospital, but she is very lethargic (falling asleep during the conversation) and is having  difficulty answering my questions.   Reproductive/Obstetrics negative OB ROS                            Anesthesia Physical Anesthesia Plan  ASA: 3  Anesthesia Plan: General   Post-op Pain Management:    Induction: Intravenous, Rapid sequence and Cricoid pressure planned  PONV Risk Score and Plan: 2 and Ondansetron, Dexamethasone and Treatment may vary due to age or medical condition  Airway Management Planned: Oral ETT  Additional Equipment:   Intra-op Plan:   Post-operative Plan: Extubation in OR  Informed Consent: I have reviewed the patients History and Physical, chart, labs and discussed the procedure including the risks, benefits and alternatives for the proposed anesthesia with the patient or authorized representative who has indicated his/her understanding and acceptance.     Dental Advisory Given  Plan Discussed with: Anesthesiologist, CRNA and Surgeon  Anesthesia Plan Comments:         Anesthesia Quick Evaluation

## 2022-03-12 NOTE — Assessment & Plan Note (Addendum)
Sepsis due to acute cholecystitis and gallstone: Both CT scan of abdomen/pelvis and right upper quadrant ultrasound showed gallstone, clinically patient has significant abdominal tenderness with leukocytosis with WBC 11.0, indicating acute cholecystitis.  Patient meets criteria for sepsis with heart rate 98, RR 22.  Lactic acid at 2.3, procalcitonin negative.  Pending blood cultures.  Dr. Hampton Abbot for general surgery is consulted, she was taken for laparoscopic cholecystectomy yesterday. -Continue IV Cipro and Flagyl -As needed Zofran for nausea and vomiting -As needed Dilaudid and Percocet for pain -Get procalcitonin and trend lactic acid level -Continue IV fluid for another day

## 2022-03-12 NOTE — Op Note (Signed)
  Procedure Date:  03/12/2022  Pre-operative Diagnosis:  Acute cholecystitis  Post-operative Diagnosis: Acute cholecystitis  Procedure:  Robotic assisted cholecystectomy with ICG FireFly cholangiogram  Surgeon:  Melvyn Neth, MD  Anesthesia:  General endotracheal  Estimated Blood Loss:  15 ml  Specimens:  gallbladder  Complications:  None  Findings:  The gallbladder was very edematous, without gangrene.  Indications for Procedure:  This is a 51 y.o. female who presents with abdominal pain and workup revealing acute cholecystitis.  The benefits, complications, treatment options, and expected outcomes were discussed with the patient. The risks of bleeding, infection, recurrence of symptoms, failure to resolve symptoms, bile duct damage, bile duct leak, retained common bile duct stone, bowel injury, and need for further procedures were all discussed with the patient and she was willing to proceed.  Description of Procedure: The patient was correctly identified in the preoperative area and brought into the operating room.  The patient was placed supine with VTE prophylaxis in place.  Appropriate time-outs were performed.  Anesthesia was induced and the patient was intubated.  Appropriate antibiotics were infused.  The abdomen was prepped and draped in a sterile fashion. An infraumbilical incision was made. A cutdown technique was used to enter the abdominal cavity without injury, and a 12 mm robotic port was inserted.  Pneumoperitoneum was obtained with appropriate opening pressures.  Three 8-mm ports were placed in the mid abdomen at the level of the umbilicus under direct visualization.  The DaVinci platform was docked, camera targeted, and instruments were placed under direct visualization.  The gallbladder was identified and found to be distended and very edematous.  The fundus was grasped and retracted cephalad.  Adhesions were lysed bluntly and with electrocautery. The infundibulum  was grasped and retracted laterally, exposing the peritoneum overlying the gallbladder.  This was incised with electrocautery and extended on either side of the gallbladder.  FireFly cholangiogram was then obtained, and we were able to clearly identify the cystic duct and common bile duct.  The cystic duct and cystic artery were carefully dissected with combination of cautery and blunt dissection.  Both were clipped twice proximally and once distally, cutting in between.  The gallbladder was taken from the gallbladder fossa in a retrograde fashion with electrocautery. The gallbladder was placed in an Endocatch bag. The liver bed was inspected and any bleeding was controlled with electrocautery. The right upper quadrant was then inspected again revealing intact clips, no bleeding, and no ductal injury.  The 8 mm ports were removed under direct visualization and the 12 mm port was removed.  The Endocatch bag was brought out via the umbilical incision. The fascial opening was closed using 0 vicryl suture.  Local anesthetic was infused in all incisions and the incisions were closed with 4-0 Monocryl.  The wounds were cleaned and sealed with DermaBond.  The patient was emerged from anesthesia and extubated and brought to the recovery room for further management.  The patient tolerated the procedure well and all counts were correct at the end of the case.   Melvyn Neth, MD

## 2022-03-12 NOTE — Assessment & Plan Note (Addendum)
-   Patient is leflunomide and every 2 month injection of Simponi, also on prednisone tapering -Continue leflunomide

## 2022-03-12 NOTE — H&P (Addendum)
History and Physical    Catherine Giles DGL:[id number] DOB: [birth year] DOA: 03/12/2022  Referring MD/NP/PA:   PCP: Venita Lick, NP   Patient coming from:  The patient is coming from home.  At baseline, pt is independent for most of ADL.        Chief Complaint: Abdominal pain  HPI: Catherine Giles is a 51 y.o. female with medical history significant of hypertension, diabetes mellitus, COPD, asthma, hypothyroidism, depression with anxiety, lupus, rheumatoid arthritis on Simponi injection every 2 month, Alpha Gall allergy, tobacco abuse, dCHF, who presents with abdominal pain.  Patient states that her abdominal pain started last night, associated with nausea and vomiting.  She has had several episodes of nonbilious nonbloody vomiting.  Her abdominal pain is diffuse, worse on the right upper quadrant, cramping, 10 out of 10 in severity, nonradiating.  No fever or chills.  Patient has mild shortness, no cough, chest pain.  Patient states that she has intermittent diarrhea for more than 2 months.  She has had 4 episodes of diarrhea since yesterday.  No symptoms of UTI  Data Reviewed and ED Course: pt was found to have WBC 11.0, troponin level 5, GFR > 60, temperature normal, blood pressure 171/89, 105/86, heart rate 98, 89, RR 22, 16 oxygen sat 97% on room air.  CT abdomen/pelvis that showed gallstone.  Ultrasound-RUQ confirmed gallstone.  Patient is admitted to telemetry bed as inpatient.  Dr. Hampton Abbot of general surgery is consulted  US-RUQ There is 1.3 cm calculus which appears to be impacted in the neck of the gallbladder. There are no imaging signs of acute cholecystitis. There is no significant dilation of bile ducts. Mild fatty infiltration is seen in the liver.  CT-abd/pelvis 1. There is a calcified gallstone in the neck of the gallbladder. Gallbladder is moderately distended, however, there is no gallbladder wall thickening, pericholecystic fluid or surrounding inflammatory changes to  suggest an acute cholecystitis at this time. If there is clinical concern for cholecystitis, further evaluation with right upper quadrant abdominal ultrasound should be considered. 2. Additional incidental findings, as above.    EKG: I have personally reviewed.  Sinus rhythm, QTc 486, LAE, early R wave progression, nonspecific T wave change   Review of Systems:   General: no fevers, chills, no body weight gain, has poor appetite, has fatigue HEENT: no blurry vision, hearing changes or sore throat Respiratory: has dyspnea, no coughing, wheezing CV: no chest pain, no palpitations GI: has nausea, vomiting, abdominal pain, diarrhea, no constipation GU: no dysuria, burning on urination, increased urinary frequency, hematuria  Ext: no leg edema Neuro: no unilateral weakness, numbness, or tingling, no vision change or hearing loss Skin: no rash, no skin tear. MSK: No muscle spasm, no deformity, no limitation of range of movement in spin Heme: No easy bruising.  Travel history: No recent long distant travel.   Allergy:  Allergies  Allergen Reactions   Azithromycin Anaphylaxis   Molds & Smuts Cough   Alpha-Gal    Penicillins     Did it involve swelling of the face/tongue/throat, SOB, or low BP? Yes Did it involve sudden or severe rash/hives, skin peeling, or any reaction on the inside of your mouth or nose? No Did you need to seek medical attention at a hospital or doctor's office? No When did it last happen?       If all above answers are "NO", may proceed with cephalosporin use.    Shellfish Allergy     Past Medical History:  Diagnosis Date   Arthritis    RA   Asthma    COPD (chronic obstructive pulmonary disease) ([place])    Depression    Diabetes mellitus without complication ([place])    Emphysema of lung ([place])    Family history of breast cancer    6/21 cancer genetic testing letter sent   Fibromyalgia    Hypertension    Lupus ([place])    Pre-diabetes    Thyroid disease      Past Surgical History:  Procedure Laterality Date   MOUTH SURGERY     TRANSTHORACIC ECHOCARDIOGRAM  04/2020   EF 60 to 65%.  Normal LV function.  No R WMA.  GR 1 DD.  Normal RV size and function.  Normal PAP.  Normal atrial size.  Normal valves.    Social History:  reports that she has been smoking cigarettes. She has a 34.00 pack-year smoking history. She has never used smokeless tobacco. She reports current alcohol use. She reports that she does not currently use drugs.  Family History:  Family History  Problem Relation Age of Onset   Lung cancer Mother    Diabetes Mother    Breast cancer Mother 66   Hypertension Father    Cancer Maternal Aunt    Ovarian cancer Maternal Aunt    Diabetes Maternal Aunt    Diabetes Maternal Uncle    Hypertension Maternal Grandmother    Diabetes Maternal Grandmother      Prior to Admission medications   Medication Sig Start Date End Date Taking? Authorizing Provider  Acidophilus Lactobacillus CAPS Take 1 tablet by mouth daily.    [provider]  albuterol (PROVENTIL) (2.5 MG/3ML) 0.083% nebulizer solution Take 3 mLs (2.5 mg total) by nebulization every 6 (six) hours as needed for wheezing or shortness of breath. 07/25/21   Coral Spikes, DO  albuterol (VENTOLIN HFA) 108 (90 Base) MCG/ACT inhaler Inhale 2 puffs into the lungs every 4 (four) hours as needed.  02/12/19 11/22/21  [provider]  amLODipine (NORVASC) 10 MG tablet Take 1 tablet (10 mg total) by mouth daily. 09/05/21   Marnee Guarneri T, NP  aspirin 81 MG EC tablet Take 1 tablet (81 mg total) by mouth daily. Swallow whole. 11/09/21   Vigg, Avanti, MD  Blood Glucose Monitoring Suppl (ONETOUCH VERIO) w/Device KIT Use to check blood sugar 3 times daily with goal <130 fasting and <180 two hours after meal.  Bring meter to visits. 01/25/21   Cannady, Henrine Screws T, NP  busPIRone (BUSPAR) 5 MG tablet Take 1 tablet (5 mg total) by mouth 2 (two) times daily. 09/05/21   Cannady, Henrine Screws  T, NP  cetirizine (ZYRTEC) 10 MG tablet Take 1 tablet (10 mg total) by mouth daily. 01/03/22   Kathrine Haddock, NP  cloNIDine (CATAPRES) 0.1 MG tablet Take 1 tablet (0.1 mg total) by mouth 2 (two) times daily. 11/22/21   Minna Merritts, MD  cyproheptadine (PERIACTIN) 4 MG tablet Take 4 mg by mouth 3 (three) times daily.    [provider]  dapagliflozin propanediol (FARXIGA) 10 MG TABS tablet Take 1 tablet (10 mg total) by mouth daily before breakfast. 03/16/21   Cannady, Henrine Screws T, NP  DULoxetine (CYMBALTA) 30 MG capsule Take 30 mg by mouth 2 (two) times daily. 10/25/21   [provider]  EPINEPHrine 0.3 mg/0.3 mL IJ SOAJ injection Inject 0.3 mg into the muscle as needed for anaphylaxis. 02/15/22   Cannady, Henrine Screws T, NP  fluticasone (FLONASE) 50 MCG/ACT nasal  spray Place 2 sprays into both nostrils daily. 07/01/20   Rodriguez-Southworth, Sunday Spillers, PA-C  Fluticasone-Umeclidin-Vilant (TRELEGY ELLIPTA) 100-62.5-25 MCG/INH AEPB Inhale 1 puff into the lungs daily. 11/29/20   [provider]  glucose blood test strip Use to check blood sugar 3 times daily with goal <130 fasting and <180 two hours after meal. 01/25/21   Cannady, Jolene T, NP  golimumab (SIMPONI ARIA) 50 MG/4ML SOLN injection Inject into the vein.    [provider]  hydrochlorothiazide (HYDRODIURIL) 25 MG tablet Take 1 tablet (25 mg total) by mouth daily. 11/22/21   Minna Merritts, MD  hydrOXYzine (VISTARIL) 25 MG capsule TAKE 1 CAPSULE BY MOUTH EVERY 8 HOURS ASNEEDED 01/03/22   Kathrine Haddock, NP  ibuprofen (ADVIL) 400 MG tablet Take 1 tablet (400 mg total) by mouth every 8 (eight) hours as needed. 07/07/21   Cannady, Henrine Screws T, NP  ipratropium-albuterol (DUONEB) 0.5-2.5 (3) MG/3ML SOLN Take 3 mLs by nebulization every 6 (six) hours as needed for up to 7 days. 10/30/21 11/22/21  Laurene Footman B, PA-C  Lactobacillus PACK Take 1 each by mouth as needed (for diarrhea.). 02/28/21   Charlynne Cousins, MD  Lancets (ONETOUCH  ULTRASOFT) lancets Use to check blood sugar 3 times daily with goal <130 fasting and <180 two hours after meal. 01/25/21   Cannady, Jolene T, NP  leflunomide (ARAVA) 20 MG tablet Take 20 mg by mouth daily. 01/22/21   [provider]  levothyroxine (SYNTHROID) 50 MCG tablet Take 50 mcg by mouth daily before breakfast.     [provider]  liothyronine (CYTOMEL) 5 MCG tablet Take 5 mcg by mouth daily. 08/05/20   [provider]  losartan (COZAAR) 100 MG tablet Take 1 tablet (100 mg total) by mouth daily. 06/02/21   McElwee, Scheryl Darter, NP  predniSONE (DELTASONE) 10 MG tablet Take 6 tablets by mouth daily for 2 days, then reduce by 1 tablet every 2 days until gone 01/03/22   Kathrine Haddock, NP  sitaGLIPtin (JANUVIA) 100 MG tablet Take 1 tablet (100 mg total) by mouth daily. 12/02/21   Cannady, Henrine Screws T, NP  tiZANidine (ZANAFLEX) 4 MG tablet Take 1 tablet (4 mg total) by mouth every 8 (eight) hours as needed for muscle spasms. 08/23/20   Eulogio Bear, NP  traZODone (DESYREL) 50 MG tablet Take 1 tablet (50 mg total) by mouth at bedtime. 01/05/20 07/01/20  Marnee Guarneri T, NP    Physical Exam: Vitals:   03/12/22 1010 03/12/22 1100 03/12/22 1200 03/12/22 1317  BP: (!) 139/97 105/86 126/73 (!) 159/97  Pulse: 88 89 88 87  Resp: _0 Temp:   98 F (36.7 C) 98.5 F (36.9 C)  TempSrc:   Oral   SpO2: 97% 93% 94% 94%  Weight:      Height:       General: Not in acute distress HEENT:       Eyes: PERRL, EOMI, no scleral icterus.       ENT: No discharge from the ears and nose, no pharynx injection, no tonsillar enlargement.        Neck: No JVD, no bruit, no mass felt. Heme: No neck lymph node enlargement. Cardiac: S1/S2, RRR, No murmurs, No gallops or rubs. Respiratory: No rales, wheezing, rhonchi or rubs. GI: Soft, nondistended, has tenderness in central abdomen and RUQ, no rebound pain, no organomegaly, BS present. GU: No hematuria Ext: No pitting leg edema  bilaterally. 1+DP/PT pulse bilaterally. Musculoskeletal: No joint deformities, No  joint redness or warmth, no limitation of ROM in spin. Skin: No rashes.  Neuro: Alert, oriented X3, cranial nerves II-XII grossly intact, moves all extremities normally. Psych: Patient is not psychotic, no suicidal or hemocidal ideation.  Labs on Admission: I have personally reviewed following labs and imaging studies  CBC: Recent Labs  Lab 03/12/22 0628  WBC 11.0*  HGB 14.1  HCT 43.7  MCV 94.6  PLT 734   Basic Metabolic Panel: Recent Labs  Lab 03/12/22 0628  NA 142  K 3.7  CL 109  CO2 24  GLUCOSE 185*  BUN 18  CREATININE 0.52  CALCIUM 9.2   GFR: Estimated Creatinine Clearance: 82.1 mL/min (by C-G formula based on SCr of 0.52 mg/dL). Liver Function Tests: Recent Labs  Lab 03/12/22 0628  AST 24  ALT 23  ALKPHOS 65  BILITOT 0.5  PROT 7.8  ALBUMIN 4.2   Recent Labs  Lab 03/12/22 0628  LIPASE 25   No results for input(s): AMMONIA in the last 168 hours. Coagulation Profile: Recent Labs  Lab 03/12/22 1347  INR 1.1   Cardiac Enzymes: No results for input(s): CKTOTAL, CKMB, CKMBINDEX, TROPONINI in the last 168 hours. BNP (last 3 results) No results for input(s): PROBNP in the last 8760 hours. HbA1C: No results for input(s): HGBA1C in the last 72 hours. CBG: No results for input(s): GLUCAP in the last 168 hours. Lipid Profile: No results for input(s): CHOL, HDL, LDLCALC, TRIG, CHOLHDL, LDLDIRECT in the last 72 hours. Thyroid Function Tests: No results for input(s): TSH, T4TOTAL, FREET4, T3FREE, THYROIDAB in the last 72 hours. Anemia Panel: No results for input(s): VITAMINB12, FOLATE, FERRITIN, TIBC, IRON, RETICCTPCT in the last 72 hours. Urine analysis: No results found for: COLORURINE, APPEARANCEUR, LABSPEC, PHURINE, GLUCOSEU, HGBUR, BILIRUBINUR, KETONESUR, PROTEINUR, UROBILINOGEN, NITRITE, LEUKOCYTESUR Sepsis Labs: _0 (procalcitonin:4,lacticidven:4) )No results  found for this or any previous visit (from the past 240 hour(s)).   Radiological Exams on Admission: CT ABDOMEN PELVIS W CONTRAST  Result Date: 03/12/2022 CLINICAL DATA:  51 year old female with history of acute onset of nonlocalized abdominal pain. EXAM: CT ABDOMEN AND PELVIS WITH CONTRAST TECHNIQUE: Multidetector CT imaging of the abdomen and pelvis was performed using the standard protocol following bolus administration of intravenous contrast. RADIATION DOSE REDUCTION: This exam was performed according to the departmental dose-optimization program which includes automated exposure control, adjustment of the mA and/or kV according to patient size and/or use of iterative reconstruction technique. CONTRAST:  150m OMNIPAQUE IOHEXOL 300 MG/ML  SOLN COMPARISON:  No priors. FINDINGS: Lower chest: Patchy areas of ground-glass attenuation, mild septal thickening and architectural distortion are noted throughout the lung bases bilaterally, similar to prior chest CT 04/29/2020, presumably chronic post infectious or inflammatory scarring. Hepatobiliary: No suspicious cystic or solid hepatic lesions. Diffuse low attenuation throughout the hepatic parenchyma, indicative of a background of hepatic steatosis. In the neck of the gallbladder there is a peripherally calcified gallstone measuring 1.8 cm in diameter. Gallbladder is moderately distended. Gallbladder wall does not appear thickened. No pericholecystic fluid or surrounding inflammatory changes. Pancreas: No pancreatic mass. No pancreatic ductal dilatation. No pancreatic or peripancreatic fluid collections or inflammatory changes. Spleen: Unremarkable. Adrenals/Urinary Tract: Multiple low-attenuation lesions in both kidneys, largest of which are compatible with simple cysts, measuring up to 1.2 cm in the lower pole of the right kidney. Other subcentimeter low-attenuation lesions are too small to definitively characterize, but statistically likely tiny cysts (no  imaging follow-up is recommended). No hydroureteronephrosis. Urinary bladder is normal in appearance. Bilateral adrenal glands  are normal in appearance. Stomach/Bowel: The appearance of the stomach is normal. There is no pathologic dilatation of small bowel or colon. Normal appendix. Vascular/Lymphatic: No significant atherosclerotic disease, aneurysm or dissection noted in the abdominal or pelvic vasculature. No lymphadenopathy noted in the abdomen or pelvis. Reproductive: Uterus and ovaries are unremarkable in appearance. Other: No significant volume of ascites.  No pneumoperitoneum. Musculoskeletal: There are no aggressive appearing lytic or blastic lesions noted in the visualized portions of the skeleton. IMPRESSION: 1. There is a calcified gallstone in the neck of the gallbladder. Gallbladder is moderately distended, however, there is no gallbladder wall thickening, pericholecystic fluid or surrounding inflammatory changes to suggest an acute cholecystitis at this time. If there is clinical concern for cholecystitis, further evaluation with right upper quadrant abdominal ultrasound should be considered. 2. Additional incidental findings, as above. Electronically Signed   By: Vinnie Langton M.D.   On: 03/12/2022 08:27   US ABDOMEN LIMITED RUQ (LIVER/GB)  Result Date: 03/12/2022 CLINICAL DATA:  Upper abdominal pain EXAM: ULTRASOUND ABDOMEN LIMITED RIGHT UPPER QUADRANT COMPARISON:  CT done earlier today FINDINGS: Gallbladder: There is 1.3 cm calculus in the neck of the gallbladder. There is no demonstrable intraluminal mobility of this calculus during the study. There is no fluid around the gallbladder. Technologist did not observe any focal tenderness over the gallbladder. Common bile duct: Diameter: 5 mm. Liver: There is slightly increased echogenicity in the liver. No focal abnormality is seen. Portal vein is patent on color Doppler imaging with normal direction of blood flow towards the liver. Other:  None. IMPRESSION: There is 1.3 cm calculus which appears to be impacted in the neck of the gallbladder. There are no imaging signs of acute cholecystitis. There is no significant dilation of bile ducts. Mild fatty infiltration is seen in the liver. Electronically Signed   By: Elmer Picker M.D.   On: 03/12/2022 09:08      Assessment/Plan Principal Problem:   Acute cholecystitis Active Problems:   Gallstone   Sepsis ([place])   COPD (chronic obstructive pulmonary disease) ([facility])   Diabetes mellitus without complication ([facility])   Chronic diastolic CHF (congestive heart failure) ([place])   Depression with anxiety   Hypertension associated with diabetes ([place])   Hypothyroidism   Rheumatoid arthritis involving multiple sites with positive rheumatoid factor ([facility])   Tobacco abuse   Diarrhea   Principal Problem:   Acute cholecystitis Active Problems:   Gallstone   Sepsis ([place])   COPD (chronic obstructive pulmonary disease) ([facility])   Diabetes mellitus without complication ([facility])   Chronic diastolic CHF (congestive heart failure) ([place])   Depression with anxiety   Hypertension associated with diabetes ([place])   Hypothyroidism   Rheumatoid arthritis involving multiple sites with positive rheumatoid factor ([facility])   Tobacco abuse   Diarrhea   Assessment and Plan: * Acute cholecystitis Sepsis due to acute cholecystitis and gallstone: Both CT scan of abdomen/pelvis and right upper quadrant ultrasound showed gallstone, clinically patient has significant abdominal tenderness with leukocytosis with WBC 11.0, indicating acute cholecystitis.  Patient meets criteria for sepsis with heart rate 98, RR 22.  Pending lactic acid level  Dr. Hampton Abbot for general surgery is consulted, will do surgery today. -Admitted to telemetry bed as inpatient -Start Cipro and Flagyl -Blood culture -As needed Zofran for nausea and vomiting -As needed Dilaudid and Percocet for pain -Get procalcitonin and trend lactic acid  level -IV fluid: 500 cc normal saline, then 125 cc/h.  If lactic acid level is elevated, will  give more IV fluid bolus  Gallstone See above  Sepsis ([place]) See above  COPD (chronic obstructive pulmonary disease) ([facility]) Stable -Bronchodilators  Diabetes mellitus without complication ([facility]) Recent A1c 6.7.  Patient is taking Januvia, Farxiga, well controlled -Sliding scale insulin  Chronic diastolic CHF (congestive heart failure) ([place]) 2D echo on 04/30/2020 showed EF 60 to 65% with grade 1 diastolic dysfunction.  BNP 23.5 today. Patient does not have leg edema JVD, CHF seems compensated.   -Watch volume status closely  Depression with anxiety - Continue home medications  Hypertension associated with diabetes ([place]) - IV hydralazine as needed -Hold HCTZ as patient need IV fluid -Continue clonidine, amlodipine  Hypothyroidism - Synthroid  Rheumatoid arthritis involving multiple sites with positive rheumatoid factor ([place]) - Patient is leflunomide and every 2 month injection of Simponi, also on prednisone tapering -Continue leflunomide -Start stress dose of Solu-Cortef 100 mg twice daily  Tobacco abuse - Nicotine patch  Diarrhea -follow up C diff and GI path panel             DVT ppx: SCD  Code Status: Full code  Family Communication: I called her husband by phone.   Disposition Plan:  Anticipate discharge back to previous environment  Consults called: Dr. Hampton Abbot  Admission status and  Level of care: Telemetry Surgical:        as inpt       Severity of Illness:  The appropriate patient status for this patient is INPATIENT. Inpatient status is judged to be reasonable and necessary in order to provide the required intensity of service to ensure the patient's safety. The patient's presenting symptoms, physical exam findings, and initial radiographic and laboratory data in the context of their chronic comorbidities is felt to place them at high risk for further  clinical deterioration. Furthermore, it is not anticipated that the patient will be medically stable for discharge from the hospital within 2 midnights of admission.   * I certify that at the point of admission it is my clinical judgment that the patient will require inpatient hospital care spanning beyond 2 midnights from the point of admission due to high intensity of service, high risk for further deterioration and high frequency of surveillance required.*       Date of Service 03/12/2022    Ivor Costa [facility]   If 7PM-7AM, please contact night-coverage [email or web address] 03/12/2022, 5:58 PM

## 2022-03-13 ENCOUNTER — Telehealth: Payer: Self-pay | Admitting: Nurse Practitioner

## 2022-03-13 DIAGNOSIS — K810 Acute cholecystitis: Secondary | ICD-10-CM | POA: Diagnosis not present

## 2022-03-13 LAB — CBC
HCT: 43 % (ref 36.0–46.0)
Hemoglobin: 13.6 g/dL (ref 12.0–15.0)
MCH: 31 pg (ref 26.0–34.0)
MCHC: 31.6 g/dL (ref 30.0–36.0)
MCV: 97.9 fL (ref 80.0–100.0)
Platelets: 345 10*3/uL (ref 150–400)
RBC: 4.39 MIL/uL (ref 3.87–5.11)
RDW: 14.6 % (ref 11.5–15.5)
WBC: 11.3 10*3/uL — ABNORMAL HIGH (ref 4.0–10.5)
nRBC: 0.2 % (ref 0.0–0.2)

## 2022-03-13 LAB — BASIC METABOLIC PANEL
Anion gap: 10 (ref 5–15)
BUN: 16 mg/dL (ref 6–20)
CO2: 23 mmol/L (ref 22–32)
Calcium: 8.9 mg/dL (ref 8.9–10.3)
Chloride: 108 mmol/L (ref 98–111)
Creatinine, Ser: 0.49 mg/dL (ref 0.44–1.00)
GFR, Estimated: >60 mL/min (ref >60)
Glucose, Bld: 170 mg/dL — ABNORMAL HIGH (ref 70–99)
Potassium: 3.8 mmol/L (ref 3.5–5.1)
Sodium: 141 mmol/L (ref 135–145)

## 2022-03-13 LAB — GASTROINTESTINAL PANEL BY PCR, STOOL (REPLACES STOOL CULTURE)

## 2022-03-13 LAB — C DIFFICILE QUICK SCREEN W PCR REFLEX
C Diff antigen: NEGATIVE
C Diff interpretation: NOT DETECTED
C Diff toxin: NEGATIVE

## 2022-03-13 LAB — GLUCOSE, CAPILLARY
Glucose-Capillary: 142 mg/dL — ABNORMAL HIGH (ref 70–99)
Glucose-Capillary: 165 mg/dL — ABNORMAL HIGH (ref 70–99)
Glucose-Capillary: 164 mg/dL — ABNORMAL HIGH (ref 70–99)
Glucose-Capillary: 135 mg/dL — ABNORMAL HIGH (ref 70–99)
Glucose-Capillary: 170 mg/dL — ABNORMAL HIGH (ref 70–99)

## 2022-03-13 LAB — LACTIC ACID, PLASMA
Lactic Acid, Venous: 2.3 mmol/L (ref 0.5–1.9)
Lactic Acid, Venous: 2.7 mmol/L (ref 0.5–1.9)

## 2022-03-13 LAB — HIV ANTIBODY (ROUTINE TESTING W REFLEX): HIV Screen 4th Generation wRfx: NONREACTIVE

## 2022-03-13 MED ORDER — LACTATED RINGERS IV SOLN
INTRAVENOUS | Status: AC
Start: 2022-03-13 — End: 2022-03-14

## 2022-03-13 MED ORDER — INSULIN GLARGINE-YFGN 100 UNIT/ML ~~LOC~~ SOLN
5.0000 [IU] | Freq: Two times a day (BID) | SUBCUTANEOUS | Status: DC
  Administered 2022-03-13 – 2022-03-15 (×4): 5 [IU] via SUBCUTANEOUS
  Filled 2022-03-13 (×6): qty 0.05

## 2022-03-13 NOTE — TOC Initial Note (Signed)
Transition of Care [facility]) - Initial/Assessment Note    Patient Details  Name: Catherine Giles MRN: [id number] Date of Birth: Jan 13, 1971  Transition of Care [facility]) CM/SW Contact:    Beverly Sessions, RN Phone Number: 03/13/2022, 5:11 PM  Clinical Narrative:                   Admitted for: POD1 lap chole Admitted from: Home.  Lives with 3 adult daughters PCP: PCP - Venita Lick, NP however patient was dismissed from practice.  Patient would like to stay at practice if at all possible.  Spoke with Verna Czech at office.  She is to check with provider  Current home health/prior home health/DME: RW  Patient states that she does not have her own means of transportation to get to appointments. She states that her daughters all work and it is difficult for them to transport to appointments. Provided information on [employer], and also encouraged patient to reach out to her insurance to see if they have an transportation resources    Expected Discharge Plan: Home/Self Care Barriers to Discharge: Continued Medical Work up   Patient Goals and CMS Choice        Expected Discharge Plan and Services Expected Discharge Plan: Home/Self Care       Living arrangements for the past 2 months: Single Family Home                                      Prior Living Arrangements/Services Living arrangements for the past 2 months: Single Family Home Lives with:: Adult Children Patient language and need for interpreter reviewed:: Yes Do you feel safe going back to the place where you live?: Yes      Need for Family Participation in Patient Care: Yes (Comment) Care giver support system in place?: Yes (comment) Current home services: DME Criminal Activity/Legal Involvement Pertinent to Current Situation/Hospitalization: No - Comment as needed  Activities of Daily Living Home Assistive Devices/Equipment: Walker (specify type) ADL Screening (condition at time of admission) Patient's cognitive  ability adequate to safely complete daily activities?: Yes Is the patient deaf or have difficulty hearing?: No Does the patient have difficulty seeing, even when wearing glasses/contacts?: No Does the patient have difficulty concentrating, remembering, or making decisions?: No Patient able to express need for assistance with ADLs?: Yes Does the patient have difficulty dressing or bathing?: No Independently performs ADLs?: Yes (appropriate for developmental age) Does the patient have difficulty walking or climbing stairs?: Yes Weakness of Legs: Both Weakness of Arms/Hands: None  Permission Sought/Granted                  Emotional Assessment       Orientation: : Oriented to Self, Oriented to Place, Oriented to Situation, Oriented to  Time Alcohol / Substance Use: Not Applicable Psych Involvement: No (comment)  Admission diagnosis:  Gallstone [K80.20] Patient Active Problem List   Diagnosis Date Noted   Gallstone 03/12/2022   Diarrhea 03/12/2022   Diabetes mellitus without complication (HCC)    COPD (chronic obstructive pulmonary disease) ([place])    Hypothyroidism    Depression with anxiety    Tobacco abuse    Chronic diastolic CHF (congestive heart failure) ([place])    Sepsis ([place])    Acute cholecystitis    Allergy to alpha-gal 01/07/2022   Depression, major, single episode, mild ([place]) 01/24/2021   Dairy allergy 10/12/2020  Vaginal stenosis 03/19/2020   Obesity 03/19/2020   Insomnia 01/05/2020   Complex regional pain syndrome I of upper limb 12/17/2019   Hypertension associated with diabetes ([place]) 12/13/2019   Centrilobular emphysema ([place]) 12/13/2019   Type 2 diabetes mellitus with obesity ([place]) 12/13/2019   Hashimoto's thyroiditis 12/13/2019   DDD (degenerative disc disease), lumbar 08/26/2019   Multinodular goiter 08/14/2019   Chronic left hip pain 05/05/2019   Osteoarthritis of spine with radiculopathy, cervical region 05/05/2019   Polypharmacy 02/14/2019    Fibromyalgia 02/10/2019   Rheumatoid arthritis involving multiple sites with positive rheumatoid factor ([place]) 02/10/2019   Cervical radiculopathy 03/13/2018   Vitamin D deficiency 10/11/2015   PCP:  Venita Lick, NP Pharmacy:   [facility], [place] [place] [address] Phone: [phone] Fax: [place], [address] [address] Phone: [phone] Fax: [phone]     Social Determinants of Health (SDOH) Interventions    Readmission Risk Interventions     View : No data to display.

## 2022-03-13 NOTE — Progress Notes (Signed)
Mobility Specialist - Progress Note   03/13/22 1100  Mobility  Activity Ambulated with assistance in hallway  Level of Assistance Standby assist, set-up cues, supervision of patient - no hands on  Assistive Device Other (Comment) (IV pole)  Distance Ambulated (ft) 320 ft  Activity Response Tolerated well  $Mobility charge 1 Mobility     Pt lying in bed upon arrival, utilizing RA. Pain 8/10 at incision site resting with increase during OOB activity. VC for log roll to complete bed mobility. Pt voiced dizziness during ambulation that did resolve. 3 short standing rest breaks to manage pain. Slow, guarded gait. Pt returned supine with alarm set, needs in reach.    Kathee Delton Mobility Specialist 03/13/22, 11:36 AM

## 2022-03-13 NOTE — Anesthesia Postprocedure Evaluation (Signed)
Anesthesia Post Note  Patient: Catherine Giles  Procedure(s) Performed: XI ROBOTIC ASSISTED LAPAROSCOPIC CHOLECYSTECTOMY  Patient location during evaluation: PACU Anesthesia Type: General Level of consciousness: awake and alert Pain management: pain level controlled Vital Signs Assessment: post-procedure vital signs reviewed and stable Respiratory status: spontaneous breathing, nonlabored ventilation, respiratory function stable and patient connected to nasal cannula oxygen Cardiovascular status: blood pressure returned to baseline and stable Postop Assessment: no apparent nausea or vomiting Anesthetic complications: no   No notable events documented.   Last Vitals:  Vitals:   03/12/22 2011 03/13/22 0048  BP: (!) 155/89 (!) 156/89  Pulse: 78 84  Resp: 17 18  Temp: 36.7 C 36.8 C  SpO2: 100% 97%    Last Pain:  Vitals:   03/12/22 2040  TempSrc:   PainSc: 10-Worst pain ever                 Martha Clan

## 2022-03-13 NOTE — Telephone Encounter (Signed)
Colletta Maryland - Case Manager with [facility] is calling in to assist pt. Colletta Maryland says that pt is in need of a hospital follow up. Pt was advised of dismissal letter on 10/28/21. Colletta Maryland would like to know if provider will continue to see pt?    Please advise further.   [phone] Maryland

## 2022-03-13 NOTE — Progress Notes (Signed)
[facility] Day(s): 1.   Post op day(s): 1 Day Post-Op.   Interval History:  Patient seen and examined No acute events or new complaints overnight.  Patient reports she is feeling sore this morning; at incisions and RUQ No fever, chills, nausea, emesis Leukocytosis is stable at 11.3K Renal function remains normal; sCr - 0.49; UO - unmeasured No electrolyte derangements Lactic acidosis to 2.3 She is on CLD; tolerating well   Vital signs in last 24 hours: [min-max] current  Temp:  [96.9 F (36.1 C)-98.5 F (36.9 C)] 98 F (36.7 C) (05/22 0454) Pulse Rate:  [78-98] 84 (05/22 0454) Resp:  [14-24] 17 (05/22 0454) BP: (105-163)/(69-113) 154/89 (05/22 0454) SpO2:  [93 %-100 %] 98 % (05/22 0454)     Height: '5\' 5"'$  (165.1 cm) Weight: 72.6 kg BMI (Calculated): 26.63   Intake/Output last 2 shifts:  05/21 0701 - 05/22 0700 In: 1986.1 [I.V.:810.9; IV Piggyback:1175.2] Out: 15 [Blood:15]   Physical Exam:  Constitutional: alert, cooperative and no distress  Respiratory: breathing non-labored at rest  Cardiovascular: regular rate and sinus rhythm  Gastrointestinal: soft, incisional soreness and in RUQ, and non-distended, no rebound/guarding Integumentary: Laparoscopic incisions are CDI with dermabond, no erythema or drainage   Labs:     Latest Ref Rng & Units 03/13/2022    5:00 AM 03/12/2022    6:28 AM 01/03/2022   10:22 AM  CBC  WBC 4.0 - 10.5 K/uL 11.3   11.0   11.8    Hemoglobin 12.0 - 15.0 g/dL 13.6   14.1   14.7    Hematocrit 36.0 - 46.0 % 43.0   43.7   44.1    Platelets 150 - 400 K/uL 345   376   369        Latest Ref Rng & Units 03/13/2022    5:00 AM 03/12/2022    6:28 AM 12/02/2021   10:42 AM  CMP  Glucose 70 - 99 mg/dL 170   185   123    BUN 6 - 20 mg/dL '16   18   11    '$ Creatinine 0.44 - 1.00 mg/dL 0.49   0.52   0.48    Sodium 135 - 145 mmol/L 141   142   145    Potassium 3.5 - 5.1 mmol/L 3.8   3.7   4.0    Chloride 98  - 111 mmol/L 108   109   105    CO2 22 - 32 mmol/L '23   24   20    '$ Calcium 8.9 - 10.3 mg/dL 8.9   9.2   9.5    Total Protein 6.5 - 8.1 g/dL  7.8   6.9    Total Bilirubin 0.3 - 1.2 mg/dL  0.5   0.4    Alkaline Phos 38 - 126 U/L  65   74    AST 15 - 41 U/L  24   15    ALT 0 - 44 U/L  23   21      Imaging studies: No new pertinent imaging studies   Assessment/Plan:  51 y.o. female 1 Day Post-Op s/p robotic assisted laparoscopic cholecystectomy for acute cholecystitis, complicated by pertinent comorbidities including rheumatoid arthritis and has Simponi Aria infusions, a daily DMARD, and prednisone.   - Advance diet as tolerated    - Continue IV Abx today given immunosuppression; will need PO for home   - Monitor abdominal examination; on-going bowel function -  Pain control prn; antiemetics prn - Monitor lactic acidosis; suspect dehydration - Mobilization as tolerated - further management per primary service; we will follow      - Discharge Planning: Stay today for IV Abx given immunosuppression. Should okay for home tomorrow (05/23) with PO Abx  All of the above findings and recommendations were discussed with the patient, and the medical team, and all of patient's questions were answered to her expressed satisfaction.  -- Edison Simon, PA-C [facility] 03/13/2022, 7:33 AM M-F: 7am - 4pm

## 2022-03-13 NOTE — Progress Notes (Signed)
Progress Note   Patient: Catherine Giles MCN:[id number] DOB: 04-Apr-1971 DOA: 03/12/2022     1 DOS: the patient was seen and examined on 03/13/2022   Brief hospital course: Taken from H&P.  Tarita Deshmukh is a 51 y.o. female with medical history significant of hypertension, diabetes mellitus, COPD, asthma, hypothyroidism, depression with anxiety, lupus, rheumatoid arthritis on Simponi injection every 2 month, Alpha Gall allergy, tobacco abuse, dCHF, who presents with abdominal pain.   Patient states that her abdominal pain started last night, associated with nausea and vomiting.  She has had several episodes of nonbilious nonbloody vomiting.  Her abdominal pain is diffuse, worse on the right upper quadrant, cramping, 10 out of 10 in severity, nonradiating.  No fever or chills.  Patient has mild shortness, no cough, chest pain.  Patient states that she has intermittent diarrhea for more than 2 months.  She has had 4 episodes of diarrhea since yesterday.  No symptoms of UTI   Data Reviewed and ED Course: pt was found to have WBC 11.0, troponin level 5, GFR > 60, temperature normal, blood pressure 171/89, 105/86, heart rate 98, 89, RR 22, 16 oxygen sat 97% on room air.  CT abdomen/pelvis that showed gallstone.  Ultrasound-RUQ confirmed gallstone.  Patient is admitted to telemetry bed as inpatient.  Dr. Hampton Abbot of general surgery is consulted   US-RUQ There is 1.3 cm calculus which appears to be impacted in the neck of the gallbladder. There are no imaging signs of acute cholecystitis. There is no significant dilation of bile ducts. Mild fatty infiltration is seen in the liver.   CT-abd/pelvis 1. There is a calcified gallstone in the neck of the gallbladder. Gallbladder is moderately distended, however, there is no gallbladder wall thickening, pericholecystic fluid or surrounding inflammatory changes to suggest an acute cholecystitis at this time. If there is clinical concern for cholecystitis,  further evaluation with right upper quadrant abdominal ultrasound should be considered. 2. Additional incidental findings, as above.    EKG: I have personally reviewed.  Sinus rhythm, QTc 486, LAE, early R wave progression, nonspecific T wave change.  5/22: Patient was taken to the OR for laparoscopic cholecystectomy with general surgery yesterday.  Tolerated the procedure well.  Found to have edematous gallbladder.  Blood cultures pending.  Mild lactic acidosis this morning.  Surgery would like to keep her for another day on IV antibiotics for concern of acute cholecystitis and her immunocompromise status.         Assessment and Plan: * Acute cholecystitis Sepsis due to acute cholecystitis and gallstone: Both CT scan of abdomen/pelvis and right upper quadrant ultrasound showed gallstone, clinically patient has significant abdominal tenderness with leukocytosis with WBC 11.0, indicating acute cholecystitis.  Patient meets criteria for sepsis with heart rate 98, RR 22.  Lactic acid at 2.3, procalcitonin negative.  Pending blood cultures.  Dr. Hampton Abbot for general surgery is consulted, she was taken for laparoscopic cholecystectomy yesterday. -Continue IV Cipro and Flagyl -As needed Zofran for nausea and vomiting -As needed Dilaudid and Percocet for pain -Get procalcitonin and trend lactic acid level -Continue IV fluid for another day  Gallstone See above  Sepsis ([place]) See above  COPD (chronic obstructive pulmonary disease) (HCC) Stable -Bronchodilators  Diabetes mellitus without complication (HCC) Recent A1c 6.7.  Patient is taking Januvia, Farxiga, well controlled -Sliding scale insulin -Add Semglee 5 units twice daily  Chronic diastolic CHF (congestive heart failure) ([place]) 2D echo on 04/30/2020 showed EF 60 to 65% with grade 1 diastolic  dysfunction.  BNP 23.5 today. Patient does not have leg edema JVD, CHF seems compensated.   -Watch volume status closely  Depression with  anxiety - Continue home medications  Hypertension associated with diabetes ([place]) - IV hydralazine as needed -Hold HCTZ as patient need IV fluid -Continue clonidine, amlodipine  Hypothyroidism - Synthroid  Rheumatoid arthritis involving multiple sites with positive rheumatoid factor ([place]) - Patient is leflunomide and every 2 month injection of Simponi, also on prednisone tapering -Continue leflunomide   Tobacco abuse - Nicotine patch  Diarrhea - C. difficile and GI pathogen panel negative.   Subjective: Patient was seen and examined today.  Having some abdominal pain which seems postsurgical.  No nausea or vomiting.  Passing flatus and had a bowel movement.  Physical Exam: Vitals:   03/12/22 2011 03/13/22 0048 03/13/22 0454 03/13/22 0737  BP: (!) 155/89 (!) 156/89 (!) 154/89 (!) 153/90  Pulse: 78 84 84 70  Resp: '17 18 17 17  '$ Temp: 98 F (36.7 C) 98.3 F (36.8 C) 98 F (36.7 C) 97.8 F (36.6 C)  TempSrc:   Oral Oral  SpO2: 100% 97% 98% 91%  Weight:      Height:       General.  Well-developed  lady, in no acute distress. Pulmonary.  Lungs clear bilaterally, normal respiratory effort. CV.  Regular rate and rhythm, no JVD, rub or murmur. Abdomen.  Soft, nontender, nondistended, BS positive. CNS.  Alert and oriented .  No focal neurologic deficit. Extremities.  No edema, no cyanosis, pulses intact and symmetrical. Psychiatry.  Judgment and insight appears normal.  Data Reviewed: Prior notes, labs and images reviewed  Family Communication: Called husband with no response  Disposition: Status is: Inpatient Remains inpatient appropriate because: Severity of illness   Planned Discharge Destination: Home  Time spent: 50 minutes  This record has been created using Systems analyst. Errors have been sought and corrected,but may not always be located. Such creation errors do not reflect on the standard of care.  Author: Lorella Nimrod, MD 03/13/2022  2:15 PM  For on call review [email or web address].

## 2022-03-13 NOTE — Hospital Course (Signed)
Taken from H&P.  Catherine Giles is a 50 y.o. female with medical history significant of hypertension, diabetes mellitus, COPD, asthma, hypothyroidism, depression with anxiety, lupus, rheumatoid arthritis on Simponi injection every 2 month, Alpha Gall allergy, tobacco abuse, dCHF, who presents with abdominal pain.   Patient states that her abdominal pain started last night, associated with nausea and vomiting.  She has had several episodes of nonbilious nonbloody vomiting.  Her abdominal pain is diffuse, worse on the right upper quadrant, cramping, 10 out of 10 in severity, nonradiating.  No fever or chills.  Patient has mild shortness, no cough, chest pain.  Patient states that she has intermittent diarrhea for more than 2 months.  She has had 4 episodes of diarrhea since yesterday.  No symptoms of UTI   Data Reviewed and ED Course: pt was found to have WBC 11.0, troponin level 5, GFR > 60, temperature normal, blood pressure 171/89, 105/86, heart rate 98, 89, RR 22, 16 oxygen sat 97% on room air.  CT abdomen/pelvis that showed gallstone.  Ultrasound-RUQ confirmed gallstone.  Patient is admitted to telemetry bed as inpatient.  Dr. Hampton Abbot of general surgery is consulted   US-RUQ There is 1.3 cm calculus which appears to be impacted in the neck of the gallbladder. There are no imaging signs of acute cholecystitis. There is no significant dilation of bile ducts. Mild fatty infiltration is seen in the liver.   CT-abd/pelvis 1. There is a calcified gallstone in the neck of the gallbladder. Gallbladder is moderately distended, however, there is no gallbladder wall thickening, pericholecystic fluid or surrounding inflammatory changes to suggest an acute cholecystitis at this time. If there is clinical concern for cholecystitis, further evaluation with right upper quadrant abdominal ultrasound should be considered. 2. Additional incidental findings, as above.    EKG: I have personally reviewed.  Sinus  rhythm, QTc 486, LAE, early R wave progression, nonspecific T wave change.  5/22: Patient was taken to the OR for laparoscopic cholecystectomy with general surgery yesterday.  Tolerated the procedure well.  Found to have edematous gallbladder.  Blood cultures pending.  Mild lactic acidosis this morning.  Surgery would like to keep her for another day on IV antibiotics for concern of acute cholecystitis and her immunocompromise status.   5/23: Patient was having little worsening of abdominal pain.  She did had a bowel movement.  Surgery would like to keep her for another day for IV antibiotics.  They were advising p.o. antibiotics on discharge for 7 days.

## 2022-03-14 DIAGNOSIS — K810 Acute cholecystitis: Secondary | ICD-10-CM | POA: Diagnosis not present

## 2022-03-14 LAB — COMPREHENSIVE METABOLIC PANEL
ALT: 38 U/L (ref 0–44)
AST: 34 U/L (ref 15–41)
Albumin: 3.3 g/dL — ABNORMAL LOW (ref 3.5–5.0)
Alkaline Phosphatase: 55 U/L (ref 38–126)
Anion gap: 8 (ref 5–15)
BUN: 13 mg/dL (ref 6–20)
CO2: 27 mmol/L (ref 22–32)
Calcium: 8.5 mg/dL — ABNORMAL LOW (ref 8.9–10.3)
Chloride: 104 mmol/L (ref 98–111)
Creatinine, Ser: 0.49 mg/dL (ref 0.44–1.00)
GFR, Estimated: >60 mL/min (ref >60)
Glucose, Bld: 174 mg/dL — ABNORMAL HIGH (ref 70–99)
Potassium: 3.2 mmol/L — ABNORMAL LOW (ref 3.5–5.1)
Sodium: 139 mmol/L (ref 135–145)
Total Bilirubin: 0.7 mg/dL (ref 0.3–1.2)
Total Protein: 6.6 g/dL (ref 6.5–8.1)

## 2022-03-14 LAB — CBC
HCT: 39.1 % (ref 36.0–46.0)
Hemoglobin: 12.5 g/dL (ref 12.0–15.0)
MCH: 30.9 pg (ref 26.0–34.0)
MCHC: 32 g/dL (ref 30.0–36.0)
MCV: 96.5 fL (ref 80.0–100.0)
Platelets: 288 10*3/uL (ref 150–400)
RBC: 4.05 MIL/uL (ref 3.87–5.11)
RDW: 14.1 % (ref 11.5–15.5)
WBC: 11.1 10*3/uL — ABNORMAL HIGH (ref 4.0–10.5)
nRBC: 0.3 % — ABNORMAL HIGH (ref 0.0–0.2)

## 2022-03-14 LAB — LACTIC ACID, PLASMA: Lactic Acid, Venous: 1.3 mmol/L (ref 0.5–1.9)

## 2022-03-14 LAB — GLUCOSE, CAPILLARY
Glucose-Capillary: 161 mg/dL — ABNORMAL HIGH (ref 70–99)
Glucose-Capillary: 131 mg/dL — ABNORMAL HIGH (ref 70–99)
Glucose-Capillary: 118 mg/dL — ABNORMAL HIGH (ref 70–99)
Glucose-Capillary: 93 mg/dL (ref 70–99)
Glucose-Capillary: 155 mg/dL — ABNORMAL HIGH (ref 70–99)

## 2022-03-14 LAB — MAGNESIUM: Magnesium: 2.1 mg/dL (ref 1.7–2.4)

## 2022-03-14 LAB — SURGICAL PATHOLOGY

## 2022-03-14 MED ORDER — POTASSIUM CHLORIDE CRYS ER 20 MEQ PO TBCR
40.0000 meq | EXTENDED_RELEASE_TABLET | Freq: Once | ORAL | Status: AC
  Administered 2022-03-14: 40 meq via ORAL
  Filled 2022-03-14: qty 2

## 2022-03-14 MED ORDER — PREDNISONE 20 MG PO TABS
20.0000 mg | ORAL_TABLET | Freq: Once | ORAL | Status: AC
  Administered 2022-03-14: 20 mg via ORAL
  Filled 2022-03-14: qty 1

## 2022-03-14 MED ORDER — PREDNISONE 20 MG PO TABS
50.0000 mg | ORAL_TABLET | Freq: Every day | ORAL | Status: DC
  Administered 2022-03-15: 50 mg via ORAL
  Filled 2022-03-14: qty 1

## 2022-03-14 NOTE — Assessment & Plan Note (Signed)
See above

## 2022-03-14 NOTE — Discharge Instructions (Signed)
In addition to included general post-operative instructions,  Diet: Resume home diet. Recommend avoiding or limiting fatty/greasy foods over the next few days/week. If you do eat these, you may (or may not) notice diarrhea. This is expected while your body adjusts to not having a gallbladder, and it typically resolves with time.    Activity: No heavy lifting >20 pounds (children, pets, laundry, garbage) or strenuous activity until follow-up in 2 weeks, but light activity and walking are encouraged. Do not drive or drink alcohol if taking narcotic pain medications or having pain that might distract from driving.  Wound care: You may shower/get incision wet with soapy water and pat dry (do not rub incisions), but no baths or submerging incision underwater until follow-up.   Medications: Resume all home medications. For mild to moderate pain: acetaminophen (Tylenol) or ibuprofen/naproxen (if no kidney disease). Combining Tylenol with alcohol can substantially increase your risk of causing liver disease. Narcotic pain medications, if prescribed, can be used for severe pain, though may cause nausea, constipation, and drowsiness. Do not combine Tylenol and Percocet (or similar) within a 6 hour period as Percocet (and similar) contain(s) Tylenol. If you do not need the narcotic pain medication, you do not need to fill the prescription.  Call office [phone] / [phone]) at any time if any questions, worsening pain, fevers/chills, bleeding, drainage from incision site, or other concerns.

## 2022-03-14 NOTE — Progress Notes (Signed)
Progress Note   Patient: Catherine Giles VZC:[id number] DOB: 1971/09/02 DOA: 03/12/2022     2 DOS: the patient was seen and examined on 03/14/2022   Brief hospital course: Taken from H&P.  Catherine Giles is a 51 y.o. female with medical history significant of hypertension, diabetes mellitus, COPD, asthma, hypothyroidism, depression with anxiety, lupus, rheumatoid arthritis on Simponi injection every 2 month, Alpha Gall allergy, tobacco abuse, dCHF, who presents with abdominal pain.   Patient states that her abdominal pain started last night, associated with nausea and vomiting.  She has had several episodes of nonbilious nonbloody vomiting.  Her abdominal pain is diffuse, worse on the right upper quadrant, cramping, 10 out of 10 in severity, nonradiating.  No fever or chills.  Patient has mild shortness, no cough, chest pain.  Patient states that she has intermittent diarrhea for more than 2 months.  She has had 4 episodes of diarrhea since yesterday.  No symptoms of UTI   Data Reviewed and ED Course: pt was found to have WBC 11.0, troponin level 5, GFR > 60, temperature normal, blood pressure 171/89, 105/86, heart rate 98, 89, RR 22, 16 oxygen sat 97% on room air.  CT abdomen/pelvis that showed gallstone.  Ultrasound-RUQ confirmed gallstone.  Patient is admitted to telemetry bed as inpatient.  Dr. Hampton Abbot of general surgery is consulted   US-RUQ There is 1.3 cm calculus which appears to be impacted in the neck of the gallbladder. There are no imaging signs of acute cholecystitis. There is no significant dilation of bile ducts. Mild fatty infiltration is seen in the liver.   CT-abd/pelvis 1. There is a calcified gallstone in the neck of the gallbladder. Gallbladder is moderately distended, however, there is no gallbladder wall thickening, pericholecystic fluid or surrounding inflammatory changes to suggest an acute cholecystitis at this time. If there is clinical concern for cholecystitis,  further evaluation with right upper quadrant abdominal ultrasound should be considered. 2. Additional incidental findings, as above.    EKG: I have personally reviewed.  Sinus rhythm, QTc 486, LAE, early R wave progression, nonspecific T wave change.  5/22: Patient was taken to the OR for laparoscopic cholecystectomy with general surgery yesterday.  Tolerated the procedure well.  Found to have edematous gallbladder.  Blood cultures pending.  Mild lactic acidosis this morning.  Surgery would like to keep her for another day on IV antibiotics for concern of acute cholecystitis and her immunocompromise status.   5/23: Patient was having little worsening of abdominal pain.  She did had a bowel movement.  Surgery would like to keep her for another day for IV antibiotics.  They were advising p.o. antibiotics on discharge for 7 days.     Assessment and Plan: * Acute cholecystitis Sepsis due to acute cholecystitis and gallstone: Both CT scan of abdomen/pelvis and right upper quadrant ultrasound showed gallstone, clinically patient has significant abdominal tenderness with leukocytosis with WBC 11.0, indicating acute cholecystitis.  Patient meets criteria for sepsis with heart rate 98, RR 22.  Lactic acid at 2.3>>1.3, procalcitonin negative.  blood cultures negative. C. difficile and GI pathogen panel negative.  Dr. Hampton Abbot for general surgery is consulted, she was taken for laparoscopic cholecystectomy. -Continue IV Cipro and Flagyl-for 1 more day and then convert to p.o. for total of 7 days. -As needed Zofran for nausea and vomiting -As needed Dilaudid and Percocet for pain   Gallstone See above  Sepsis ([place]) See above  COPD (chronic obstructive pulmonary disease) (HCC) Stable -Bronchodilators  Diabetes  mellitus without complication (HCC) Recent A1c 6.7.  Patient is taking Januvia, Farxiga, well controlled -Sliding scale insulin -Add Semglee 5 units twice daily  Chronic diastolic CHF  (congestive heart failure) ([place]) 2D echo on 04/30/2020 showed EF 60 to 65% with grade 1 diastolic dysfunction.  BNP 23.5 today. Patient does not have leg edema JVD, CHF seems compensated.   -Watch volume status closely  Depression with anxiety - Continue home medications  Hypertension associated with diabetes ([place]) - IV hydralazine as needed -Hold HCTZ as patient need IV fluid -Continue clonidine, amlodipine  Hypothyroidism - Synthroid  Rheumatoid arthritis involving multiple sites with positive rheumatoid factor ([place]) - Patient is leflunomide and every 2 month injection of Simponi, also on prednisone tapering -Continue leflunomide   Tobacco abuse - Nicotine patch  Diarrhea - C. difficile and GI pathogen panel negative.   Subjective: Patient was having abdominal pain when seen today.  No nausea or vomiting.  Had a bowel movement this morning.  She does not think that she is ready to go home.  Physical Exam: Vitals:   03/13/22 1507 03/13/22 2014 03/14/22 0213 03/14/22 0752  BP: (!) 166/94 (!) 164/89 (!) 183/96 (!) 178/77  Pulse: 84 88 78 70  Resp:  '18 18 19  '$ Temp:  98.4 F (36.9 C) 98.1 F (36.7 C) 98.1 F (36.7 C)  TempSrc:      SpO2: 90% 92% 90% 94%  Weight:      Height:       General.     In no acute distress. Pulmonary.  Lungs clear bilaterally, normal respiratory effort. CV.  Regular rate and rhythm, no JVD, rub or murmur. Abdomen.  Soft, diffuse tenderness with mild distention, BS positive. CNS.  Alert and oriented .  No focal neurologic deficit. Extremities.  No edema, no cyanosis, pulses intact and symmetrical. Psychiatry.  Judgment and insight appears normal.  Data Reviewed: Prior notes, labs and images reviewed  Family Communication: Discussed with patient  Disposition: Status is: Inpatient Remains inpatient appropriate because: Severity of illness   Planned Discharge Destination: Home  Time spent: 45 minutes  This record has been created using  Systems analyst. Errors have been sought and corrected,but may not always be located. Such creation errors do not reflect on the standard of care.  Author: Lorella Nimrod, MD 03/14/2022 2:56 PM  For on call review [email or web address].

## 2022-03-14 NOTE — Assessment & Plan Note (Signed)
Sepsis due to acute cholecystitis and gallstone: Both CT scan of abdomen/pelvis and right upper quadrant ultrasound showed gallstone, clinically patient has significant abdominal tenderness with leukocytosis with WBC 11.0, indicating acute cholecystitis.  Patient meets criteria for sepsis with heart rate 98, RR 22.  Lactic acid at 2.3>>1.3, procalcitonin negative.  blood cultures negative. C. difficile and GI pathogen panel negative.  Dr. Hampton Abbot for general surgery is consulted, she was taken for laparoscopic cholecystectomy. -Continue IV Cipro and Flagyl-for 1 more day and then convert to p.o. for total of 7 days. -As needed Zofran for nausea and vomiting -As needed Dilaudid and Percocet for pain

## 2022-03-14 NOTE — Progress Notes (Addendum)
[facility] Day(s): 2.   Post op day(s): 2 Days Post-Op.   Interval History:  Patient seen and examined No acute events or new complaints overnight.  Patient very somnolent this morning. She reports right sided abdominal pain but then will fall asleep during our conversations No fever, chills, nausea, emesis Leukocytosis is stable at 11.1K Renal function remains normal; sCr - 0.49; UO - unmeasured Mild hypokalemia to 3.2 Lactic acidosis resolved She is on heart healthy/carb modified diet; tolerating well   Vital signs in last 24 hours: [min-max] current  Temp:  [97.8 F (36.6 C)-98.4 F (36.9 C)] 98.1 F (36.7 C) (05/23 0213) Pulse Rate:  [70-88] 78 (05/23 0213) Resp:  [17-18] 18 (05/23 0213) BP: (153-183)/(89-96) 183/96 (05/23 0213) SpO2:  [90 %-92 %] 90 % (05/23 0213)     Height: '5\' 5"'$  (165.1 cm) Weight: 72.6 kg BMI (Calculated): 26.63   Intake/Output last 2 shifts:  No intake/output data recorded.   Physical Exam:  Constitutional: alert, cooperative and no distress  Respiratory: breathing non-labored at rest  Cardiovascular: regular rate and sinus rhythm  Gastrointestinal: soft, incisional soreness and in RUQ, and non-distended, no rebound/guarding Integumentary: Laparoscopic incisions are CDI with dermabond, no erythema or drainage   Labs:     Latest Ref Rng & Units 03/14/2022    5:51 AM 03/13/2022    5:00 AM 03/12/2022    6:28 AM  CBC  WBC 4.0 - 10.5 K/uL 11.1   11.3   11.0    Hemoglobin 12.0 - 15.0 g/dL 12.5   13.6   14.1    Hematocrit 36.0 - 46.0 % 39.1   43.0   43.7    Platelets 150 - 400 K/uL 288   345   376        Latest Ref Rng & Units 03/14/2022    5:51 AM 03/13/2022    5:00 AM 03/12/2022    6:28 AM  CMP  Glucose 70 - 99 mg/dL 174   170   185    BUN 6 - 20 mg/dL '13   16   18    '$ Creatinine 0.44 - 1.00 mg/dL 0.49   0.49   0.52    Sodium 135 - 145 mmol/L 139   141   142    Potassium 3.5 - 5.1 mmol/L 3.2    3.8   3.7    Chloride 98 - 111 mmol/L 104   108   109    CO2 22 - 32 mmol/L '27   23   24    '$ Calcium 8.9 - 10.3 mg/dL 8.5   8.9   9.2    Total Protein 6.5 - 8.1 g/dL 6.6    7.8    Total Bilirubin 0.3 - 1.2 mg/dL 0.7    0.5    Alkaline Phos 38 - 126 U/L 55    65    AST 15 - 41 U/L 34    24    ALT 0 - 44 U/L 38    23      Imaging studies: No new pertinent imaging studies   Assessment/Plan:  51 y.o. female 2 Days Post-Op s/p robotic assisted laparoscopic cholecystectomy for acute cholecystitis, complicated by pertinent comorbidities including rheumatoid arthritis and has Simponi Aria infusions, a daily DMARD, and prednisone.   - Okay to continue regular diet; updated dietary recommendations for home  - Continue IV Abx today given immunosuppression; will need PO for home   -  Monitor abdominal examination; on-going bowel function - Pain control prn; antiemetics prn - Monitor lactic acidosis; suspect dehydration - Mobilization as tolerated - further management per primary service; we will follow      - Discharge Planning: Okay for discharge from surgical perspective, PO Abx x7 days, follow up in 2-3 weeks. DC and follow up updated.   All of the above findings and recommendations were discussed with the patient, and the medical team, and all of patient's questions were answered to her expressed satisfaction.  -- Edison Simon, PA-C [facility] 03/14/2022, 7:18 AM M-F: 7am - 4pm

## 2022-03-15 DIAGNOSIS — K810 Acute cholecystitis: Secondary | ICD-10-CM | POA: Diagnosis not present

## 2022-03-15 LAB — BASIC METABOLIC PANEL
Anion gap: 8 (ref 5–15)
BUN: 11 mg/dL (ref 6–20)
CO2: 32 mmol/L (ref 22–32)
Calcium: 8.8 mg/dL — ABNORMAL LOW (ref 8.9–10.3)
Chloride: 102 mmol/L (ref 98–111)
Creatinine, Ser: 0.41 mg/dL — ABNORMAL LOW (ref 0.44–1.00)
GFR, Estimated: >60 mL/min (ref >60)
Glucose, Bld: 132 mg/dL — ABNORMAL HIGH (ref 70–99)
Potassium: 3.4 mmol/L — ABNORMAL LOW (ref 3.5–5.1)
Sodium: 142 mmol/L (ref 135–145)

## 2022-03-15 LAB — GLUCOSE, CAPILLARY
Glucose-Capillary: 107 mg/dL — ABNORMAL HIGH (ref 70–99)
Glucose-Capillary: 107 mg/dL — ABNORMAL HIGH (ref 70–99)
Glucose-Capillary: 158 mg/dL — ABNORMAL HIGH (ref 70–99)

## 2022-03-15 LAB — MAGNESIUM: Magnesium: 2.2 mg/dL (ref 1.7–2.4)

## 2022-03-15 MED ORDER — OXYCODONE HCL 5 MG PO TABS: 5.0000 mg | ORAL_TABLET | Freq: Four times a day (QID) | ORAL | 0 refills | Status: DC | PRN

## 2022-03-15 MED ORDER — METRONIDAZOLE 500 MG PO TABS: 500.0000 mg | ORAL_TABLET | Freq: Two times a day (BID) | ORAL | 0 refills | Status: DC

## 2022-03-15 MED ORDER — LABETALOL HCL 5 MG/ML IV SOLN
20.0000 mg | INTRAVENOUS | Status: DC | PRN
Start: 2022-03-15 — End: 2022-03-15
  Administered 2022-03-15 (×2): 20 mg via INTRAVENOUS
  Filled 2022-03-15 (×2): qty 4

## 2022-03-15 MED ORDER — CIPROFLOXACIN HCL 500 MG PO TABS: 500.0000 mg | ORAL_TABLET | Freq: Two times a day (BID) | ORAL | 0 refills | Status: AC

## 2022-03-15 MED ORDER — HYDROCHLOROTHIAZIDE 25 MG PO TABS
25.0000 mg | ORAL_TABLET | Freq: Every day | ORAL | Status: DC
  Administered 2022-03-15: 25 mg via ORAL
  Filled 2022-03-15: qty 1

## 2022-03-15 MED ORDER — POLYETHYLENE GLYCOL 3350 17 G PO PACK
17.0000 g | PACK | Freq: Every day | ORAL | 0 refills | Status: DC
Start: 2022-03-15 — End: 2023-09-14

## 2022-03-15 NOTE — Discharge Summary (Addendum)
Catherine Giles WUJ:[id number] DOB: Feb 02, 1971 DOA: 03/12/2022  PCP: Venita Lick, NP  Admit date: 03/12/2022 Discharge date: 03/15/2022  Time spent: 40 minutes  Recommendations for Outpatient Follow-up:  Pcp and gen surg f/u  Attention to blood pressure and glucose at f/u.    Discharge Diagnoses:  Principal Problem:   Acute cholecystitis Active Problems:   Gallstone   Sepsis ([place])   COPD (chronic obstructive pulmonary disease) (HCC)   Diabetes mellitus without complication (HCC)   Chronic diastolic CHF (congestive heart failure) (HCC)   Depression with anxiety   Hypertension associated with diabetes ([place])   Hypothyroidism   Rheumatoid arthritis involving multiple sites with positive rheumatoid factor ([place])   Tobacco abuse   Diarrhea   Discharge Condition: stable  Diet recommendation: carb modified  Filed Weights   03/12/22 0629  Weight: 72.6 kg    History of present illness:  [name] is a 51 y.o. female with medical history significant of hypertension, diabetes mellitus, COPD, asthma, hypothyroidism, depression with anxiety, lupus, rheumatoid arthritis on Simponi injection every 2 month, Alpha Gall allergy, tobacco abuse, dCHF, who presents with abdominal pain.   Patient states that her abdominal pain started last night, associated with nausea and vomiting.  She has had several episodes of nonbilious nonbloody vomiting.  Her abdominal pain is diffuse, worse on the right upper quadrant, cramping, 10 out of 10 in severity, nonradiating.  No fever or chills.  Patient has mild shortness, no cough, chest pain.  Patient states that she has intermittent diarrhea for more than 2 months.  She has had 4 episodes of diarrhea since yesterday.  No symptoms of UTI  Hospital Course:  Patient presented with acute cholecystitis. Underwent robotic-assisted cholecystectomy on 5/21 with general surgery. Was treated with IV antibiotics. On day of discharge patient patient tolerating  diet, pain adequately controlled, ambulating on her own without significant difficulty, desiring discharge home. Will d/c home with oral cipro/flagyl and oxycodone for pain control with miralax for bowel regimen. BP here elevated in setting of several home meds held; i've advised re-starting those home meds but will need attention to bp at PCP f/u. Of note although glucose wnl here patient reports elevations at home, so will need attention to that as well at PCP f/u.  Procedures: cholecystectomy   Consultations: Gen surg  Discharge Exam: Vitals:   03/15/22 1128 03/15/22 1146  BP: (!) 163/83 (!) 161/90  Pulse:    Resp:    Temp:    SpO2:      General: appears in mild pain Cardiovascular: rrr Respiratory: ctab Abdomen: mildly distended, diffuse mild tenderness, surgical trocar sites c/d/i  Discharge Instructions   Discharge Instructions     Diet - low sodium heart healthy   Complete by: As directed    Increase activity slowly   Complete by: As directed    No wound care   Complete by: As directed       Allergies as of 03/15/2022       Reactions   Azithromycin Anaphylaxis   Molds & Smuts Cough   Alpha-gal    Penicillins    Did it involve swelling of the face/tongue/throat, SOB, or low BP? Yes Did it involve sudden or severe rash/hives, skin peeling, or any reaction on the inside of your mouth or nose? No Did you need to seek medical attention at a hospital or doctor's office? No When did it last happen?       If all above answers are "NO", may  proceed with cephalosporin use.   Shellfish Allergy         Medication List     STOP taking these medications    cyproheptadine 4 MG tablet Commonly known as: PERIACTIN       TAKE these medications    Acidophilus Lactobacillus Caps Take 1 tablet by mouth daily. What changed: Another medication with the same name was removed. Continue taking this medication, and follow the directions you see here.   albuterol 108  (90 Base) MCG/ACT inhaler Commonly known as: VENTOLIN HFA Inhale 2 puffs into the lungs every 4 (four) hours as needed.   albuterol (2.5 MG/3ML) 0.083% nebulizer solution Commonly known as: PROVENTIL Take 3 mLs (2.5 mg total) by nebulization every 6 (six) hours as needed for wheezing or shortness of breath.   amLODipine 10 MG tablet Commonly known as: NORVASC Take 1 tablet (10 mg total) by mouth daily.   aspirin EC 81 MG tablet Take 1 tablet (81 mg total) by mouth daily. Swallow whole.   busPIRone 5 MG tablet Commonly known as: BUSPAR Take 1 tablet (5 mg total) by mouth 2 (two) times daily.   cetirizine 10 MG tablet Commonly known as: ZYRTEC Take 1 tablet (10 mg total) by mouth daily.   ciprofloxacin 500 MG tablet Commonly known as: Cipro Take 1 tablet (500 mg total) by mouth 2 (two) times daily for 10 days.   cloNIDine 0.1 MG tablet Commonly known as: CATAPRES Take 1 tablet (0.1 mg total) by mouth 2 (two) times daily.   dapagliflozin propanediol 10 MG Tabs tablet Commonly known as: Farxiga Take 1 tablet (10 mg total) by mouth daily before breakfast.   DULoxetine 30 MG capsule Commonly known as: CYMBALTA Take 30 mg by mouth 2 (two) times daily.   EPINEPHrine 0.3 mg/0.3 mL Soaj injection Commonly known as: EPI-PEN Inject 0.3 mg into the muscle as needed for anaphylaxis.   famotidine 20 MG tablet Commonly known as: PEPCID Take 20 mg by mouth 2 (two) times daily.   glucose blood test strip Use to check blood sugar 3 times daily with goal <130 fasting and <180 two hours after meal.   hydrochlorothiazide 25 MG tablet Commonly known as: HYDRODIURIL Take 1 tablet (25 mg total) by mouth daily.   hydrOXYzine 25 MG capsule Commonly known as: VISTARIL TAKE 1 CAPSULE BY MOUTH EVERY 8 HOURS ASNEEDED   ibuprofen 600 MG tablet Commonly known as: ADVIL Take 600 mg by mouth 3 (three) times daily.   ipratropium-albuterol 0.5-2.5 (3) MG/3ML Soln Commonly known as:  DUONEB Take 3 mLs by nebulization every 6 (six) hours as needed for up to 7 days.   leflunomide 20 MG tablet Commonly known as: ARAVA Take 20 mg by mouth daily.   levothyroxine 50 MCG tablet Commonly known as: SYNTHROID Take 50 mcg by mouth daily before breakfast.   metroNIDAZOLE 500 MG tablet Commonly known as: FLAGYL Take 1 tablet (500 mg total) by mouth 2 (two) times daily.   montelukast 10 MG tablet Commonly known as: SINGULAIR Take 10 mg by mouth daily.   onetouch ultrasoft lancets Use to check blood sugar 3 times daily with goal <130 fasting and <180 two hours after meal.   OneTouch Verio w/Device Kit Use to check blood sugar 3 times daily with goal <130 fasting and <180 two hours after meal.  Bring meter to visits.   oxyCODONE 5 MG immediate release tablet Commonly known as: Oxy IR/ROXICODONE Take 1-2 tablets (5-10 mg total) by mouth every  6 (six) hours as needed for moderate pain or severe pain.   polyethylene glycol 17 g packet Commonly known as: MiraLax Take 17 g by mouth daily.   predniSONE 10 MG tablet Commonly known as: DELTASONE Take 6 tablets by mouth daily for 2 days, then reduce by 1 tablet every 2 days until gone   Simponi Aria 50 MG/4ML Soln injection Generic drug: golimumab Inject into the vein.   sitaGLIPtin 100 MG tablet Commonly known as: Januvia Take 1 tablet (100 mg total) by mouth daily.   Trelegy Ellipta 100-62.5-25 MCG/ACT Aepb Generic drug: Fluticasone-Umeclidin-Vilant Inhale 1 puff into the lungs daily.        Allergies  Allergen Reactions   Azithromycin Anaphylaxis   Molds & Smuts Cough   Alpha-Gal    Penicillins     Did it involve swelling of the face/tongue/throat, SOB, or low BP? Yes Did it involve sudden or severe rash/hives, skin peeling, or any reaction on the inside of your mouth or nose? No Did you need to seek medical attention at a hospital or doctor's office? No When did it last happen?       If all above answers  are "NO", may proceed with cephalosporin use.    Shellfish Allergy     Follow-up Information     Olean Ree, MD. Schedule an appointment as soon as possible for a visit on 03/30/2022.   Specialty: General Surgery Why: Appointment at 1:30 pm. Contact information: [address] [place] [address] [phone]         Venita Lick, NP. Go on 03/22/2022.   Specialty: Nurse Practitioner Why: Scheduled for 1:20 pm please arrive 15 minutes before your scheduled time. Contact information: [place] [address] [phone]                  The results of significant diagnostics from this hospitalization (including imaging, microbiology, ancillary and laboratory) are listed below for reference.    Significant Diagnostic Studies: CT ABDOMEN PELVIS W CONTRAST  Result Date: 03/12/2022 CLINICAL DATA:  51 year old female with history of acute onset of nonlocalized abdominal pain. EXAM: CT ABDOMEN AND PELVIS WITH CONTRAST TECHNIQUE: Multidetector CT imaging of the abdomen and pelvis was performed using the standard protocol following bolus administration of intravenous contrast. RADIATION DOSE REDUCTION: This exam was performed according to the departmental dose-optimization program which includes automated exposure control, adjustment of the mA and/or kV according to patient size and/or use of iterative reconstruction technique. CONTRAST:  132mL OMNIPAQUE IOHEXOL 300 MG/ML  SOLN COMPARISON:  No priors. FINDINGS: Lower chest: Patchy areas of ground-glass attenuation, mild septal thickening and architectural distortion are noted throughout the lung bases bilaterally, similar to prior chest CT 04/29/2020, presumably chronic post infectious or inflammatory scarring. Hepatobiliary: No suspicious cystic or solid hepatic lesions. Diffuse low attenuation throughout the hepatic parenchyma, indicative of a background of hepatic steatosis. In the neck of the  gallbladder there is a peripherally calcified gallstone measuring 1.8 cm in diameter. Gallbladder is moderately distended. Gallbladder wall does not appear thickened. No pericholecystic fluid or surrounding inflammatory changes. Pancreas: No pancreatic mass. No pancreatic ductal dilatation. No pancreatic or peripancreatic fluid collections or inflammatory changes. Spleen: Unremarkable. Adrenals/Urinary Tract: Multiple low-attenuation lesions in both kidneys, largest of which are compatible with simple cysts, measuring up to 1.2 cm in the lower pole of the right kidney. Other subcentimeter low-attenuation lesions are too small to definitively characterize, but statistically likely tiny cysts (no imaging  follow-up is recommended). No hydroureteronephrosis. Urinary bladder is normal in appearance. Bilateral adrenal glands are normal in appearance. Stomach/Bowel: The appearance of the stomach is normal. There is no pathologic dilatation of small bowel or colon. Normal appendix. Vascular/Lymphatic: No significant atherosclerotic disease, aneurysm or dissection noted in the abdominal or pelvic vasculature. No lymphadenopathy noted in the abdomen or pelvis. Reproductive: Uterus and ovaries are unremarkable in appearance. Other: No significant volume of ascites.  No pneumoperitoneum. Musculoskeletal: There are no aggressive appearing lytic or blastic lesions noted in the visualized portions of the skeleton. IMPRESSION: 1. There is a calcified gallstone in the neck of the gallbladder. Gallbladder is moderately distended, however, there is no gallbladder wall thickening, pericholecystic fluid or surrounding inflammatory changes to suggest an acute cholecystitis at this time. If there is clinical concern for cholecystitis, further evaluation with right upper quadrant abdominal ultrasound should be considered. 2. Additional incidental findings, as above. Electronically Signed   By: Vinnie Langton M.D.   On: 03/12/2022 08:27    US ABDOMEN LIMITED RUQ (LIVER/GB)  Result Date: 03/12/2022 CLINICAL DATA:  Upper abdominal pain EXAM: ULTRASOUND ABDOMEN LIMITED RIGHT UPPER QUADRANT COMPARISON:  CT done earlier today FINDINGS: Gallbladder: There is 1.3 cm calculus in the neck of the gallbladder. There is no demonstrable intraluminal mobility of this calculus during the study. There is no fluid around the gallbladder. Technologist did not observe any focal tenderness over the gallbladder. Common bile duct: Diameter: 5 mm. Liver: There is slightly increased echogenicity in the liver. No focal abnormality is seen. Portal vein is patent on color Doppler imaging with normal direction of blood flow towards the liver. Other: None. IMPRESSION: There is 1.3 cm calculus which appears to be impacted in the neck of the gallbladder. There are no imaging signs of acute cholecystitis. There is no significant dilation of bile ducts. Mild fatty infiltration is seen in the liver. Electronically Signed   By: Elmer Picker M.D.   On: 03/12/2022 09:08    Microbiology: Recent Results (from the past 240 hour(s))  Gastrointestinal Panel by PCR , Stool     Status: None   Collection Time: 03/12/22  5:07 AM   Specimen: STOOL  Result Value Ref Range Status   Campylobacter species NOT DETECTED NOT DETECTED Final   Plesimonas shigelloides NOT DETECTED NOT DETECTED Final   Salmonella species NOT DETECTED NOT DETECTED Final   Yersinia enterocolitica NOT DETECTED NOT DETECTED Final   Vibrio species NOT DETECTED NOT DETECTED Final   Vibrio cholerae NOT DETECTED NOT DETECTED Final   Enteroaggregative E coli (EAEC) NOT DETECTED NOT DETECTED Final   Enteropathogenic E coli (EPEC) NOT DETECTED NOT DETECTED Final   Enterotoxigenic E coli (ETEC) NOT DETECTED NOT DETECTED Final   Shiga like toxin producing E coli (STEC) NOT DETECTED NOT DETECTED Final   Shigella/Enteroinvasive E coli (EIEC) NOT DETECTED NOT DETECTED Final   Cryptosporidium NOT DETECTED NOT  DETECTED Final   Cyclospora cayetanensis NOT DETECTED NOT DETECTED Final   Entamoeba histolytica NOT DETECTED NOT DETECTED Final   Giardia lamblia NOT DETECTED NOT DETECTED Final   Adenovirus F40/41 NOT DETECTED NOT DETECTED Final   Astrovirus NOT DETECTED NOT DETECTED Final   Norovirus GI/GII NOT DETECTED NOT DETECTED Final   Rotavirus A NOT DETECTED NOT DETECTED Final   Sapovirus (I, II, IV, and V) NOT DETECTED NOT DETECTED Final    Comment: Performed at [facility], [address], [place], [place] [address]  C Difficile Quick Screen w PCR  reflex     Status: None   Collection Time: 03/13/22  5:07 AM   Specimen: STOOL  Result Value Ref Range Status   C Diff antigen NEGATIVE NEGATIVE Final   C Diff toxin NEGATIVE NEGATIVE Final   C Diff interpretation No C. difficile detected.  Final    Comment: Performed at [facility], [address] [place], [place] [address]  Culture, blood (Routine X 2) w Reflex to ID Panel     Status: None (Preliminary result)   Collection Time: 03/13/22  5:56 AM   Specimen: BLOOD  Result Value Ref Range Status   Specimen Description BLOOD RIGHT [facility]  Final   Special Requests   Final    BOTTLES DRAWN AEROBIC AND ANAEROBIC Blood Culture adequate volume   Culture   Final    NO GROWTH 2 DAYS Performed at [facility], [address] [place], [place] [address]    Report Status PENDING  Incomplete  Culture, blood (Routine X 2) w Reflex to ID Panel     Status: None (Preliminary result)   Collection Time: 03/13/22  6:06 AM   Specimen: BLOOD  Result Value Ref Range Status   Specimen Description BLOOD RIGHT HAND  Final   Special Requests   Final    BOTTLES DRAWN AEROBIC AND ANAEROBIC Blood Culture adequate volume   Culture   Final    NO GROWTH 2 DAYS Performed at [facility], [address] [place], [place] [address]    Report Status PENDING  Incomplete     Labs: Basic Metabolic Panel: Recent Labs  Lab  03/12/22 0628 03/13/22 0500 03/14/22 0551 03/15/22 0444  NA 142 141 139 142  K 3.7 3.8 3.2* 3.4*  CL 109 108 104 102  CO2 24 23 27  32  GLUCOSE 185* 170* 174* 132*  BUN 18 16 13 11   CREATININE 0.52 0.49 0.49 0.41*  CALCIUM 9.2 8.9 8.5* 8.8*  MG  --   --  2.1 2.2   Liver Function Tests: Recent Labs  Lab 03/12/22 0628 03/14/22 0551  AST 24 34  ALT 23 38  ALKPHOS 65 55  BILITOT 0.5 0.7  PROT 7.8 6.6  ALBUMIN 4.2 3.3*   Recent Labs  Lab 03/12/22 0628  LIPASE 25   No results for input(s): AMMONIA in the last 168 hours. CBC: Recent Labs  Lab 03/12/22 0628 03/13/22 0500 03/14/22 0551  WBC 11.0* 11.3* 11.1*  HGB 14.1 13.6 12.5  HCT 43.7 43.0 39.1  MCV 94.6 97.9 96.5  PLT 376 345 288   Cardiac Enzymes: No results for input(s): CKTOTAL, CKMB, CKMBINDEX, TROPONINI in the last 168 hours. BNP: BNP (last 3 results) Recent Labs    03/12/22 0628  BNP 23.5    ProBNP (last 3 results) No results for input(s): PROBNP in the last 8760 hours.  CBG: Recent Labs  Lab 03/14/22 1602 03/14/22 2111 03/15/22 0728 03/15/22 0802 03/15/22 1209  GLUCAP 93 155* 107* 107* 158*       Signed:  Desma Maxim MD.  [facility] 03/15/2022, 12:30 PM

## 2022-03-15 NOTE — Telephone Encounter (Signed)
Copied from [place] [id number]. Topic: General - Other >> Mar 15, 2022  8:36 AM Bayard Beaver wrote: Reason for CRM:Pt called in wants to speak with Practice Aministrator about her care. She wouldn't go into detail. Please call back.

## 2022-03-15 NOTE — TOC Transition Note (Signed)
Transition of Care [facility]) - CM/SW Discharge Note   Patient Details  Name: Catherine Giles MRN: [id number] Date of Birth: 1971/07/07  [facility]) CM/SW Contact:  Beverly Sessions, RN Phone Number: 03/15/2022, 10:37 AM   Clinical Narrative:      Patient to discharge today Patient states that her spouse will transport at discharge Patient aware that she has a follow up appointment next week with her PCP, but that after that she will have to establish care with a different PCP.  She will discuss this further at her appointment   Barriers to Discharge: Continued Medical Work up   Patient Goals and CMS Choice        Discharge Placement                       Discharge Plan and Services                                     Social Determinants of Health (SDOH) Interventions     Readmission Risk Interventions     View : No data to display.

## 2022-03-15 NOTE — Progress Notes (Signed)
[place] Hospital Day(s): 3.   Post op day(s): 3 Days Post-Op.   Interval History:  Patient seen and examined No acute events or new complaints overnight.  Patient she is feeling tired this morning; still with abdominal soreness  Decreased appetite No fever, chills, nausea, emesis Renal function remains normal; sCr - 0.41; UO - unmeasured Mild hypokalemia to 3.4 (from 3.2 yesterday) She is on heart healthy/carb modified diet; tolerating; eating less   Vital signs in last 24 hours: [min-max] current  Temp:  [97.8 F (36.6 C)-98.4 F (36.9 C)] 97.9 F (36.6 C) (05/24 0731) Pulse Rate:  [70-83] 73 (05/24 0732) Resp:  [18-19] 18 (05/24 0211) BP: (167-180)/(77-96) 178/95 (05/24 0731) SpO2:  [87 %-100 %] 91 % (05/24 0732)     Height: '5\' 5"'$  (165.1 cm) Weight: 72.6 kg BMI (Calculated): 26.63   Intake/Output last 2 shifts:  No intake/output data recorded.   Physical Exam:  Constitutional: alert, cooperative and no distress  Respiratory: breathing non-labored at rest  Cardiovascular: regular rate and sinus rhythm  Gastrointestinal: soft, incisional soreness and in RUQ, and non-distended, no rebound/guarding Integumentary: Laparoscopic incisions are CDI with dermabond, no erythema or drainage, she does seem to have ecchymosis at her incisions   Labs:     Latest Ref Rng & Units 03/14/2022    5:51 AM 03/13/2022    5:00 AM 03/12/2022    6:28 AM  CBC  WBC 4.0 - 10.5 K/uL 11.1   11.3   11.0    Hemoglobin 12.0 - 15.0 g/dL 12.5   13.6   14.1    Hematocrit 36.0 - 46.0 % 39.1   43.0   43.7    Platelets 150 - 400 K/uL 288   345   376        Latest Ref Rng & Units 03/15/2022    4:44 AM 03/14/2022    5:51 AM 03/13/2022    5:00 AM  CMP  Glucose 70 - 99 mg/dL 132   174   170    BUN 6 - 20 mg/dL '11   13   16    '$ Creatinine 0.44 - 1.00 mg/dL 0.41   0.49   0.49    Sodium 135 - 145 mmol/L 142   139   141    Potassium 3.5 - 5.1 mmol/L 3.4   3.2   3.8     Chloride 98 - 111 mmol/L 102   104   108    CO2 22 - 32 mmol/L 32   27   23    Calcium 8.9 - 10.3 mg/dL 8.8   8.5   8.9    Total Protein 6.5 - 8.1 g/dL  6.6     Total Bilirubin 0.3 - 1.2 mg/dL  0.7     Alkaline Phos 38 - 126 U/L  55     AST 15 - 41 U/L  34     ALT 0 - 44 U/L  38       Imaging studies: No new pertinent imaging studies   Assessment/Plan:  51 y.o. female 3 Days Post-Op s/p robotic assisted laparoscopic cholecystectomy for acute cholecystitis, complicated by pertinent comorbidities including rheumatoid arthritis and has Simponi Aria infusions, a daily DMARD, and prednisone.   - Okay to continue regular diet; updated dietary recommendations for home  - Continue IV Abx while here given immunosuppression; will need PO for home   - Monitor abdominal examination; on-going bowel function - Pain  control prn; antiemetics prn - Mobilization as tolerated - further management per primary service      - Discharge Planning: She remains okay for discharge from surgical perspective, PO Abx x7 days, follow up in 2-3 weeks. DC and follow up updated.   All of the above findings and recommendations were discussed with the patient, and the medical team, and all of patient's questions were answered to her expressed satisfaction.  -- Edison Simon, PA-C [facility] 03/15/2022, 7:32 AM M-F: 7am - 4pm

## 2022-03-15 NOTE — Care Management Important Message (Signed)
Important Message  Patient Details  Name: Catherine Giles MRN: [id number] Date of Birth: Apr 18, 1971   Medicare Important Message Given:  Yes  Reviewed Medicare IM with patient via room phone due to isolation status.  Verbal consent given to sign initial for chart.  Copy placed in mail to home address on file for patient's records.   Dannette Barbara 03/15/2022, 11:56 AM

## 2022-03-18 LAB — CULTURE, BLOOD (ROUTINE X 2)
Culture: NO GROWTH
Culture: NO GROWTH
Special Requests: ADEQUATE
Special Requests: ADEQUATE

## 2022-03-22 ENCOUNTER — Inpatient Hospital Stay: Payer: Medicare Other | Admitting: Nurse Practitioner

## 2022-03-22 DIAGNOSIS — Z9049 Acquired absence of other specified parts of digestive tract: Secondary | ICD-10-CM

## 2022-03-23 DIAGNOSIS — E559 Vitamin D deficiency, unspecified: Secondary | ICD-10-CM | POA: Diagnosis not present

## 2022-03-23 DIAGNOSIS — E1169 Type 2 diabetes mellitus with other specified complication: Secondary | ICD-10-CM | POA: Diagnosis not present

## 2022-03-23 DIAGNOSIS — E042 Nontoxic multinodular goiter: Secondary | ICD-10-CM | POA: Diagnosis not present

## 2022-03-23 DIAGNOSIS — E041 Nontoxic single thyroid nodule: Secondary | ICD-10-CM | POA: Diagnosis not present

## 2022-03-23 DIAGNOSIS — E063 Autoimmune thyroiditis: Secondary | ICD-10-CM | POA: Diagnosis not present

## 2022-03-23 DIAGNOSIS — E039 Hypothyroidism, unspecified: Secondary | ICD-10-CM | POA: Diagnosis not present

## 2022-03-30 ENCOUNTER — Encounter: Payer: Self-pay | Admitting: Physician Assistant

## 2022-03-30 ENCOUNTER — Ambulatory Visit (INDEPENDENT_AMBULATORY_CARE_PROVIDER_SITE_OTHER): Payer: Medicare Other | Admitting: Physician Assistant

## 2022-03-30 VITALS — BP 137/81 | HR 115 | Temp 97.5°F | Ht 63.0 in | Wt 169.4 lb

## 2022-03-30 DIAGNOSIS — K810 Acute cholecystitis: Secondary | ICD-10-CM

## 2022-03-30 DIAGNOSIS — Z09 Encounter for follow-up examination after completed treatment for conditions other than malignant neoplasm: Secondary | ICD-10-CM

## 2022-03-30 NOTE — Progress Notes (Signed)
[facility] POST-OP OFFICE VISIT  03/30/2022  HPI: Catherine Giles is a 51 y.o. female 19 days s/p robotic assisted laparoscopic cholecystectomy for acute cholecystitis with Dr Kirke Corin.   She seems to be doing reasonably well She states "her body feels lighter, like it is rid of internal issues" Still complains of some right sided abdominal soreness. Is using motrin or oxycodone for this mainly at nighttime Some intermittent nausea and diarrhea No fever, chills, emesis No issues with the incisions No other new complaints   Vital signs: BP 137/81   Pulse (!) 115   Temp (!) 97.5 F (36.4 C) (Oral)   Ht '5\' 3"'$  (1.6 m)   Wt 169 lb 6.4 oz (76.8 kg)   SpO2 96%   BMI 30.01 kg/m    Physical Exam: Constitutional: Well appearing female, NAD Abdomen: Soft, non-tender, non-distended, no rebound/guarding Skin: Laparoscopic incisions are healing well, no erythema or drainage   Assessment/Plan: This is a 51 y.o. female 19 days s/p robotic assisted laparoscopic cholecystectomy for acute cholecystitis    - Imodium PRN for diarrhea  - Pain control prn  - Reviewed wound care recommendation  - Reviewed lifting restrictions; 4 weeks total  - Reviewed surgical pathology; [place]  - She can follow up on as needed basis; She understands to call with questions/concerns  -- Edison Simon, PA-C [facility] 03/30/2022, 2:20 PM M-F: 7am - 4pm

## 2022-03-30 NOTE — Patient Instructions (Addendum)
If you have any concerns or questions, please feel free to call our office. Follow up as needed.    GENERAL POST-OPERATIVE PATIENT INSTRUCTIONS   WOUND CARE INSTRUCTIONS:  Keep a dry clean dressing on the wound if there is drainage. The initial bandage may be removed after 24 hours.  Once the wound has quit draining you may leave it open to air.  If clothing rubs against the wound or causes irritation and the wound is not draining you may cover it with a dry dressing during the daytime.  Try to keep the wound dry and avoid ointments on the wound unless directed to do so.  If the wound becomes bright red and painful or starts to drain infected material that is not clear, please contact your physician immediately.  If the wound is mildly pink and has a thick firm ridge underneath it, this is normal, and is referred to as a healing ridge.  This will resolve over the next 4-6 weeks.  BATHING: You may shower if you have been informed of this by your surgeon. However, Please do not submerge in a tub, hot tub, or pool until incisions are completely sealed or have been told by your surgeon that you may do so.  DIET:  You may eat any foods that you can tolerate.  It is a good idea to eat a high fiber diet and take in plenty of fluids to prevent constipation.  If you do become constipated you may want to take a mild laxative or take ducolax tablets on a daily basis until your bowel habits are regular.  Constipation can be very uncomfortable, along with straining, after recent surgery.  ACTIVITY:  You are encouraged to cough and deep breath or use your incentive spirometer if you were given one, every 15-30 minutes when awake.  This will help prevent respiratory complications and low grade fevers post-operatively if you had a general anesthetic.  You may want to hug a pillow when coughing and sneezing to add additional support to the surgical area, if you had abdominal or chest surgery, which will decrease pain  during these times.  You are encouraged to walk and engage in light activity for the next two weeks.  You should not lift more than 20 pounds for 6 weeks total after surgery as it could put you at increased risk for complications.  Twenty pounds is roughly equivalent to a plastic bag of groceries. At that time- Listen to your body when lifting, if you have pain when lifting, stop and then try again in a few days. Soreness after doing exercises or activities of daily living is normal as you get back in to your normal routine.  MEDICATIONS:  Try to take narcotic medications and anti-inflammatory medications, such as tylenol, ibuprofen, naprosyn, etc., with food.  This will minimize stomach upset from the medication.  Should you develop nausea and vomiting from the pain medication, or develop a rash, please discontinue the medication and contact your physician.  You should not drive, make important decisions, or operate machinery when taking narcotic pain medication.  SUNBLOCK Use sun block to incision area over the next year if this area will be exposed to sun. This helps decrease scarring and will allow you avoid a permanent darkened area over your incision.  QUESTIONS:  Please feel free to call our office if you have any questions, and we will be glad to assist you. [phone]   

## 2022-04-13 ENCOUNTER — Other Ambulatory Visit: Payer: Self-pay | Admitting: Nurse Practitioner

## 2022-04-13 DIAGNOSIS — Z91018 Allergy to other foods: Secondary | ICD-10-CM | POA: Diagnosis not present

## 2022-04-13 DIAGNOSIS — K219 Gastro-esophageal reflux disease without esophagitis: Secondary | ICD-10-CM | POA: Diagnosis not present

## 2022-04-13 DIAGNOSIS — L299 Pruritus, unspecified: Secondary | ICD-10-CM | POA: Diagnosis not present

## 2022-04-13 DIAGNOSIS — R070 Pain in throat: Secondary | ICD-10-CM | POA: Diagnosis not present

## 2022-04-17 ENCOUNTER — Ambulatory Visit: Payer: Medicare Other

## 2022-04-19 DIAGNOSIS — M0579 Rheumatoid arthritis with rheumatoid factor of multiple sites without organ or systems involvement: Secondary | ICD-10-CM | POA: Diagnosis not present

## 2022-05-24 ENCOUNTER — Encounter: Payer: Self-pay | Admitting: Emergency Medicine

## 2022-05-24 ENCOUNTER — Emergency Department
Admission: EM | Admit: 2022-05-24 | Discharge: 2022-05-24 | Discharge status: Against Medical Advice | Payer: Medicare Other | Attending: Emergency Medicine | Admitting: Emergency Medicine

## 2022-05-24 ENCOUNTER — Emergency Department: Payer: Medicare Other

## 2022-05-24 DIAGNOSIS — Z5321 Procedure and treatment not carried out due to patient leaving prior to being seen by health care provider: Secondary | ICD-10-CM | POA: Insufficient documentation

## 2022-05-24 DIAGNOSIS — R519 Headache, unspecified: Secondary | ICD-10-CM | POA: Insufficient documentation

## 2022-05-24 DIAGNOSIS — R109 Unspecified abdominal pain: Secondary | ICD-10-CM | POA: Insufficient documentation

## 2022-05-24 DIAGNOSIS — R110 Nausea: Secondary | ICD-10-CM | POA: Diagnosis not present

## 2022-05-24 LAB — COMPREHENSIVE METABOLIC PANEL
ALT: 17 U/L (ref 0–44)
AST: 19 U/L (ref 15–41)
Albumin: 3.6 g/dL (ref 3.5–5.0)
Alkaline Phosphatase: 65 U/L (ref 38–126)
Anion gap: 5 (ref 5–15)
BUN: 16 mg/dL (ref 6–20)
CO2: 26 mmol/L (ref 22–32)
Calcium: 8.7 mg/dL — ABNORMAL LOW (ref 8.9–10.3)
Chloride: 110 mmol/L (ref 98–111)
Creatinine, Ser: 0.58 mg/dL (ref 0.44–1.00)
GFR, Estimated: >60 mL/min (ref >60)
Glucose, Bld: 189 mg/dL — ABNORMAL HIGH (ref 70–99)
Potassium: 3.9 mmol/L (ref 3.5–5.1)
Sodium: 141 mmol/L (ref 135–145)
Total Bilirubin: 0.4 mg/dL (ref 0.3–1.2)
Total Protein: 7 g/dL (ref 6.5–8.1)

## 2022-05-24 LAB — CBC
HCT: 45.4 % (ref 36.0–46.0)
Hemoglobin: 14.2 g/dL (ref 12.0–15.0)
MCH: 30.4 pg (ref 26.0–34.0)
MCHC: 31.3 g/dL (ref 30.0–36.0)
MCV: 97.2 fL (ref 80.0–100.0)
Platelets: 345 10*3/uL (ref 150–400)
RBC: 4.67 MIL/uL (ref 3.87–5.11)
RDW: 13.9 % (ref 11.5–15.5)
WBC: 7.8 10*3/uL (ref 4.0–10.5)
nRBC: 0 % (ref 0.0–0.2)

## 2022-05-24 LAB — LIPASE, BLOOD: Lipase: 33 U/L (ref 11–51)

## 2022-05-24 LAB — TROPONIN I (HIGH SENSITIVITY): Troponin I (High Sensitivity): 4 ng/L (ref <18)

## 2022-05-24 MED ORDER — ONDANSETRON 4 MG PO TBDP
4.0000 mg | ORAL_TABLET | Freq: Once | ORAL | Status: AC | PRN
  Administered 2022-05-24: 4 mg via ORAL
  Filled 2022-05-24: qty 1

## 2022-05-24 NOTE — ED Notes (Signed)
Called for treatment room x 1, no answer, pt not visualized in the lobby

## 2022-05-24 NOTE — ED Triage Notes (Signed)
Pt presents via EMS with complaints of abdominal pain, nausea, and headache for the last several days. Of note, pt had gallbladder sx in May 2023. Denies CP or SOB.    VS with EMS 177/96 88 -HR 98%

## 2022-05-24 NOTE — ED Notes (Signed)
Called for treatment room x 2, no answer, pt not visualized in the lobby

## 2022-05-25 ENCOUNTER — Other Ambulatory Visit: Payer: Self-pay | Admitting: Nurse Practitioner

## 2022-06-06 ENCOUNTER — Ambulatory Visit: Payer: Medicare Other | Admitting: Obstetrics & Gynecology

## 2022-06-10 IMAGING — CR DG CHEST 2V
2 series · 2 of 2 positions shown · non-contrast
Comparison: December 02, 2019.

CLINICAL DATA: Chest pain.

EXAM:
CHEST - 2 VIEW

[chest pa]
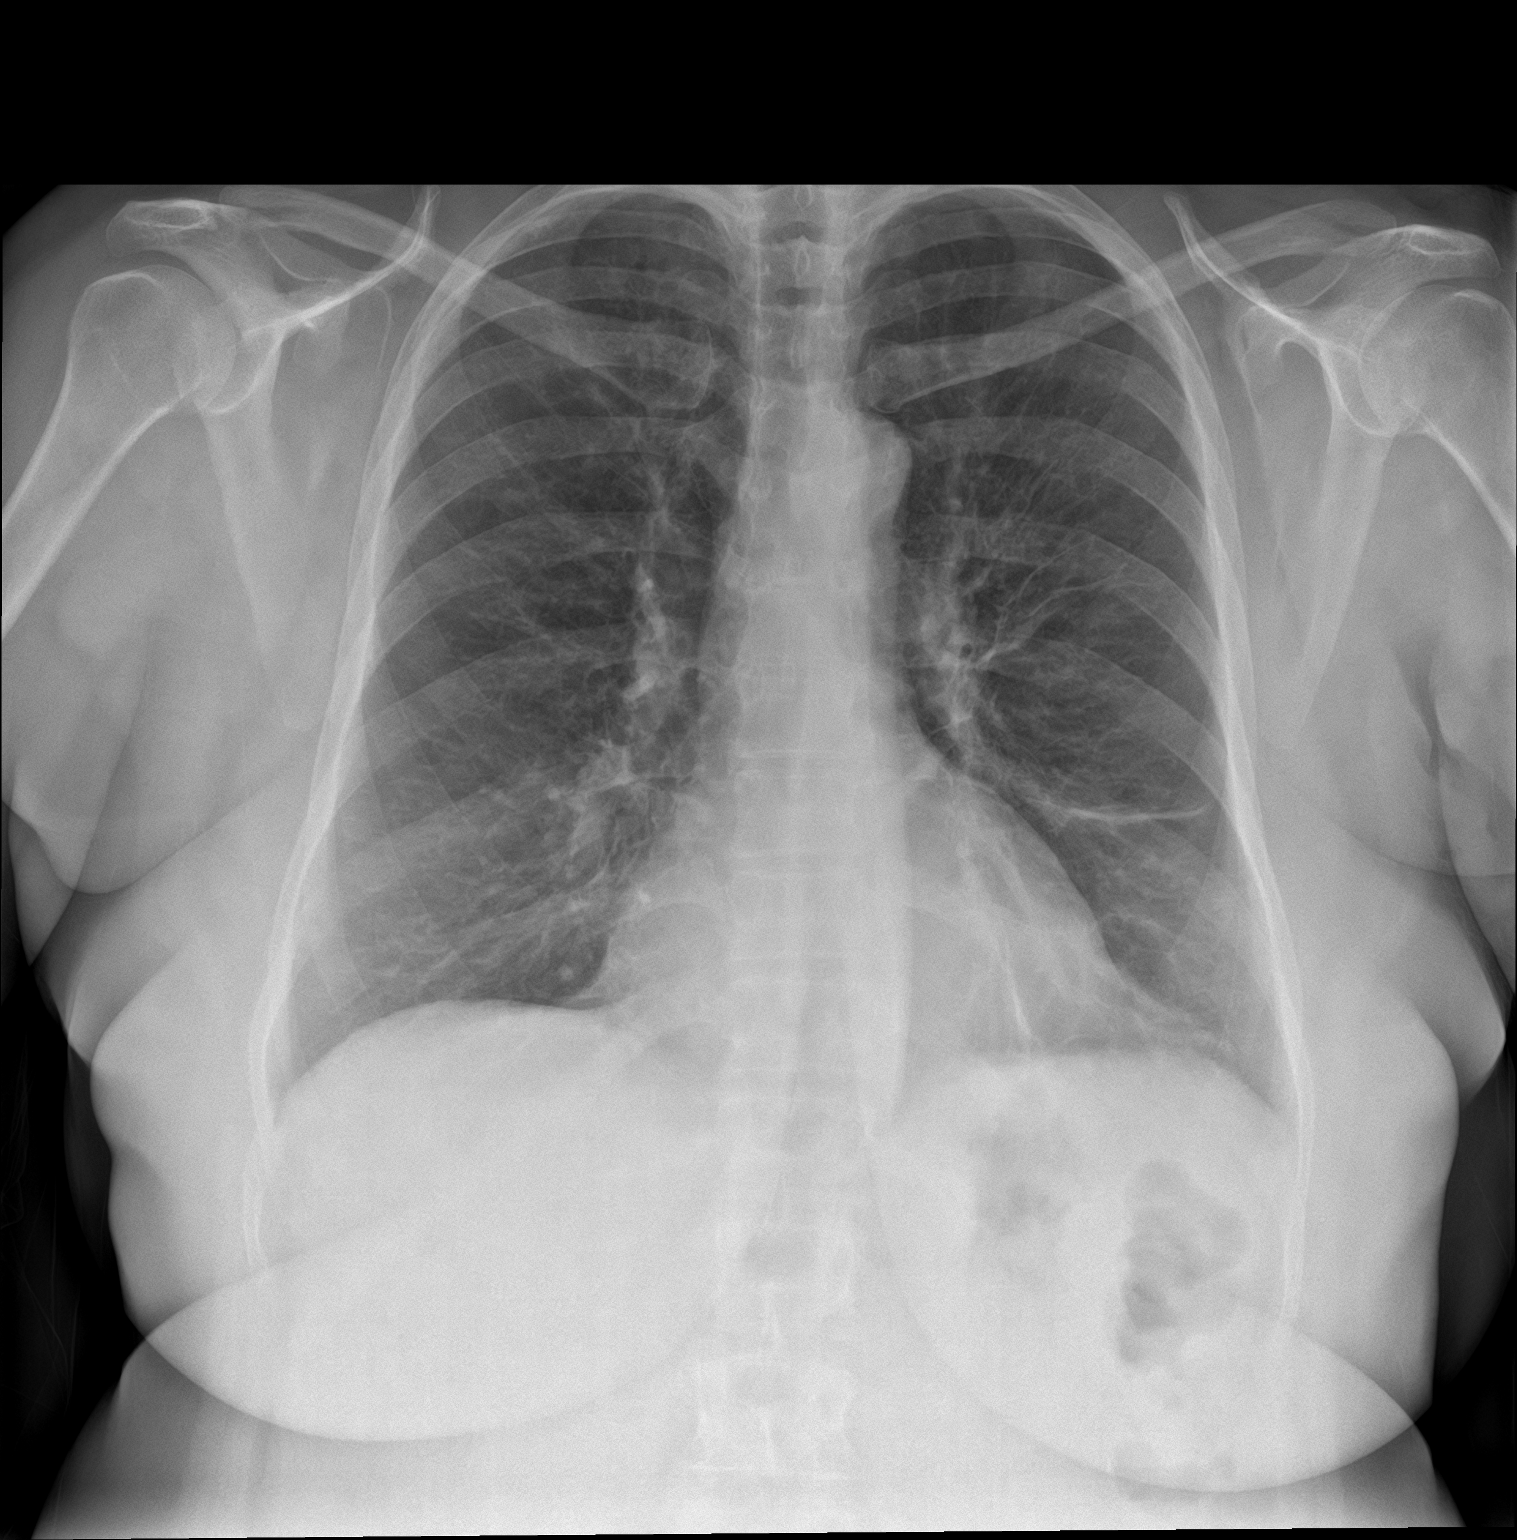

[chest lat]
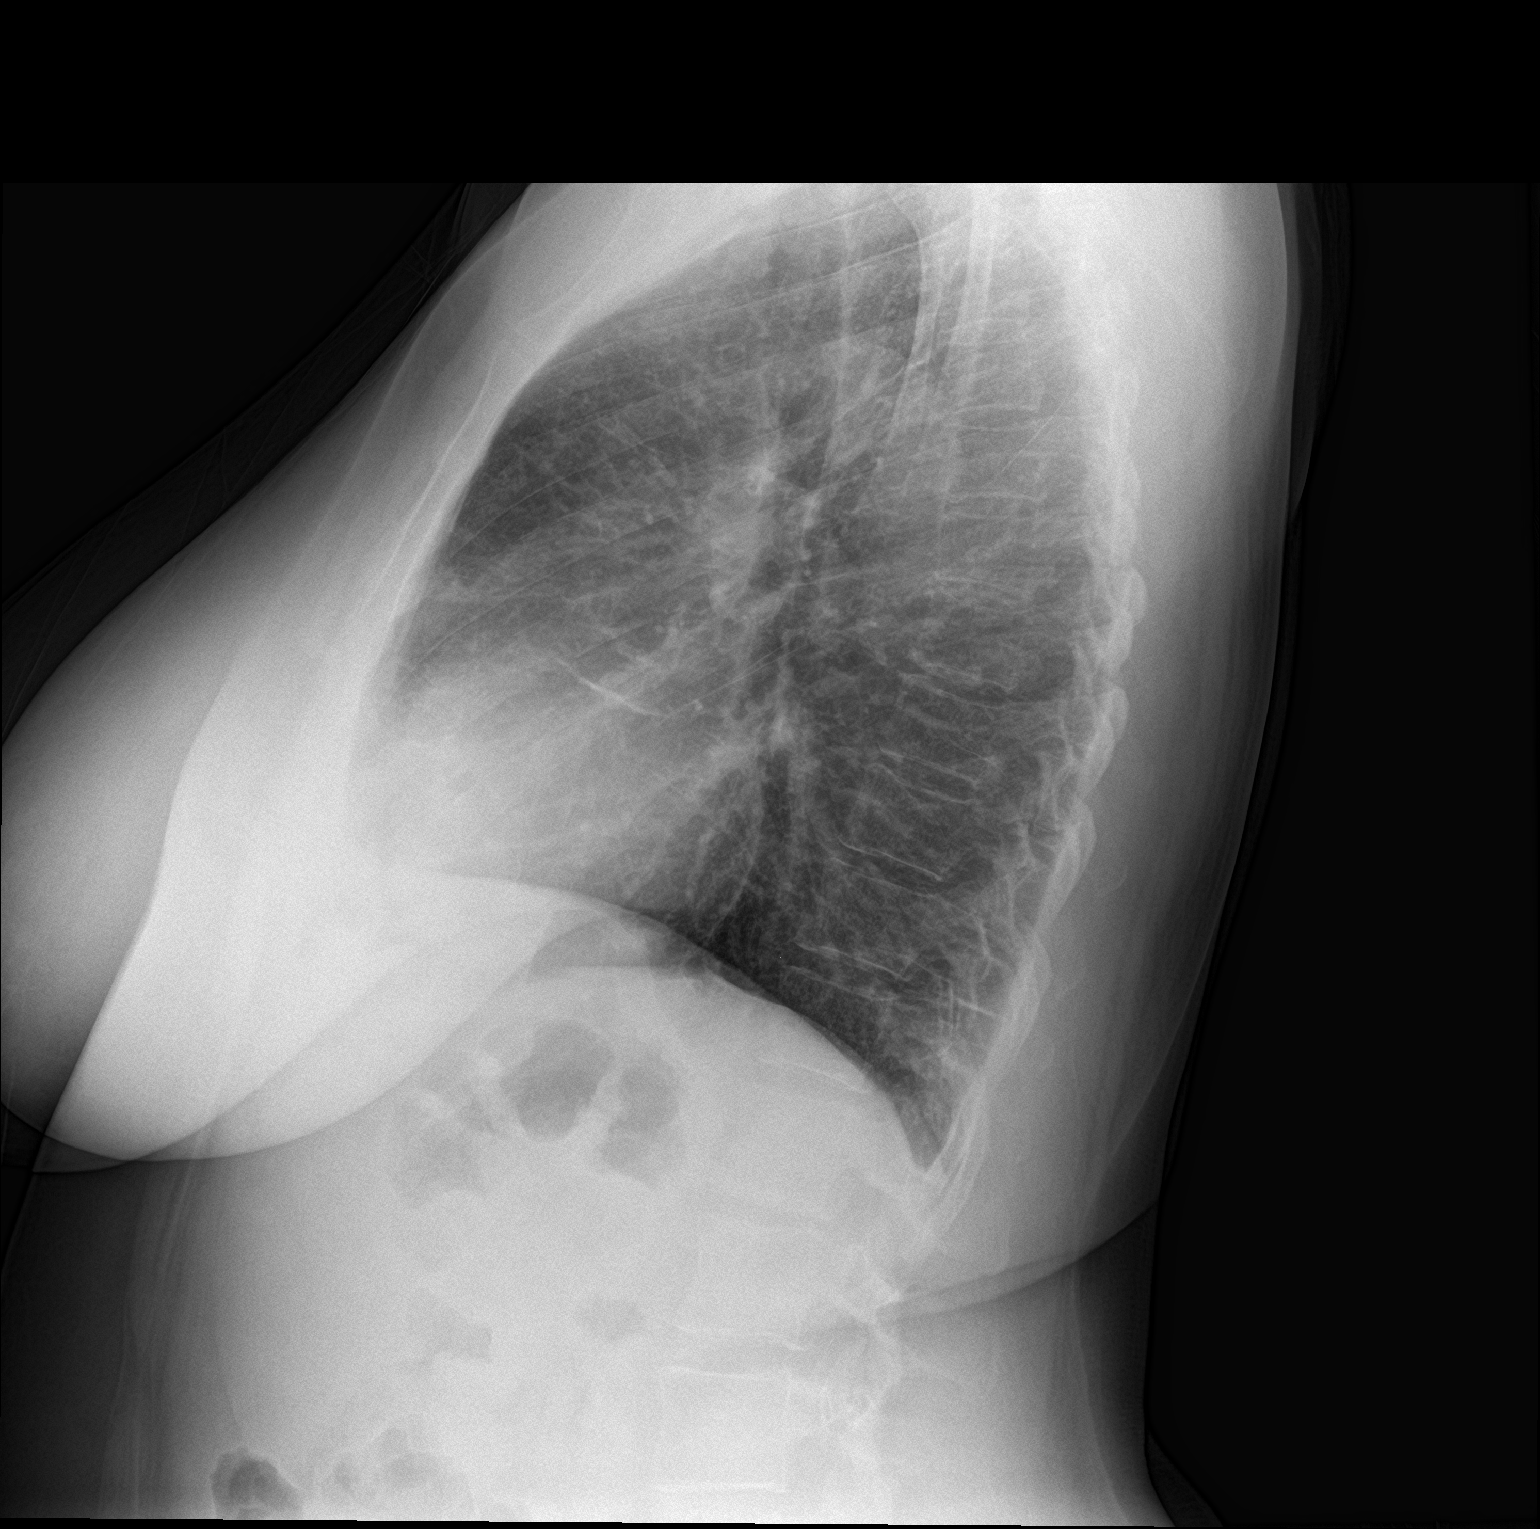

[2 of 2 positions shown; findings below may reference images not displayed]

FINDINGS: The heart size and mediastinal contours are within normal limits. No
pneumothorax or pleural effusion is noted. Mild lingular
subsegmental atelectasis is noted. Right lung is clear. The
visualized skeletal structures are unremarkable.
IMPRESSION: Mild lingular subsegmental atelectasis.

## 2022-06-21 ENCOUNTER — Ambulatory Visit: Payer: Medicare Other | Admitting: Obstetrics & Gynecology

## 2022-06-23 ENCOUNTER — Ambulatory Visit
Admission: EM | Admit: 2022-06-23 | Discharge: 2022-06-23 | Disposition: A | Discharge status: Home / Self Care | Payer: Medicare Other | Attending: Physician Assistant | Admitting: Physician Assistant

## 2022-06-23 ENCOUNTER — Other Ambulatory Visit: Payer: Self-pay

## 2022-06-23 ENCOUNTER — Encounter: Payer: Self-pay | Admitting: Emergency Medicine

## 2022-06-23 DIAGNOSIS — U071 COVID-19: Secondary | ICD-10-CM | POA: Diagnosis present

## 2022-06-23 DIAGNOSIS — J449 Chronic obstructive pulmonary disease, unspecified: Secondary | ICD-10-CM | POA: Diagnosis present

## 2022-06-23 DIAGNOSIS — R051 Acute cough: Secondary | ICD-10-CM | POA: Diagnosis present

## 2022-06-23 DIAGNOSIS — Z20822 Contact with and (suspected) exposure to covid-19: Secondary | ICD-10-CM | POA: Diagnosis present

## 2022-06-23 LAB — SARS CORONAVIRUS 2 BY RT PCR: SARS Coronavirus 2 by RT PCR: POSITIVE — AB

## 2022-06-23 MED ORDER — PREDNISONE 20 MG PO TABS: 40.0000 mg | ORAL_TABLET | Freq: Every day | ORAL | 0 refills | Status: AC

## 2022-06-23 MED ORDER — PROMETHAZINE-DM 6.25-15 MG/5ML PO SYRP: 5.0000 mL | ORAL_SOLUTION | Freq: Four times a day (QID) | ORAL | 0 refills | Status: DC | PRN

## 2022-06-23 MED ORDER — NIRMATRELVIR/RITONAVIR (PAXLOVID)TABLET: 3.0000 | ORAL_TABLET | Freq: Two times a day (BID) | ORAL | 0 refills | Status: AC

## 2022-06-23 NOTE — ED Provider Notes (Signed)
[facility]    CSN: [id number] Arrival date & time: 06/23/22  0953      History   Chief Complaint Chief Complaint  Patient presents with   Cough    HPI Catherine Giles is a 51 y.o. female with history of asthma, COPD, fibromyalgia, lupus, rheumatoid arthritis.  Patient presents today for onset of cough, congestion and shortness of breath as well as headaches and fatigue yesterday.  She reports that she recently returned from [place] and went to a reunion there with 150 people and about half those people ended up having COVID later.  She is concerned about the possibility of COVID given her various illnesses.  She denies fever.  She has been using her DuoNeb and says that helps with the breathing.  Does not have an O2 sensor.  Says that her normal oxygen level ranges from 96% to 98%.  She says that her two children are sick with similar symptoms.  HPI  Past Medical History:  Diagnosis Date   Arthritis    RA   Asthma    COPD (chronic obstructive pulmonary disease) ([place])    Depression    Diabetes mellitus without complication ([place])    Emphysema of lung ([place])    Family history of breast cancer    6/21 cancer genetic testing letter sent   Fibromyalgia    Hypertension    Lupus ([place])    Pre-diabetes    Thyroid disease     Patient Active Problem List   Diagnosis Date Noted   S/P cholecystectomy 03/12/2022   Diabetes mellitus without complication (HCC)    COPD (chronic obstructive pulmonary disease) (HCC)    Hypothyroidism    Nicotine dependence, cigarettes, uncomplicated    Chronic diastolic CHF (congestive heart failure) ([place])    Acute cholecystitis    Allergy to alpha-gal 01/07/2022   Depression, major, single episode, mild ([place]) 01/24/2021   Dairy allergy 10/12/2020   Vaginal stenosis 03/19/2020   Obesity 03/19/2020   Insomnia 01/05/2020   Complex regional pain syndrome I of upper limb 12/17/2019   Hypertension associated with diabetes ([place]) 12/13/2019    Centrilobular emphysema ([place]) 12/13/2019   Type 2 diabetes mellitus with obesity ([place]) 12/13/2019   Hashimoto's thyroiditis 12/13/2019   DDD (degenerative disc disease), lumbar 08/26/2019   Multinodular goiter 08/14/2019   Chronic left hip pain 05/05/2019   Osteoarthritis of spine with radiculopathy, cervical region 05/05/2019   Polypharmacy 02/14/2019   Fibromyalgia 02/10/2019   Rheumatoid arthritis involving multiple sites with positive rheumatoid factor ([place]) 02/10/2019   Cervical radiculopathy 03/13/2018   Vitamin D deficiency 10/11/2015    Past Surgical History:  Procedure Laterality Date   MOUTH SURGERY     TRANSTHORACIC ECHOCARDIOGRAM  04/2020   EF 60 to 65%.  Normal LV function.  No R WMA.  GR 1 DD.  Normal RV size and function.  Normal PAP.  Normal atrial size.  Normal valves.    OB History     Gravida  9   Para  6   Term  0   Preterm  0   AB  3   Living         SAB  2   IAB  0   Ectopic  0   Multiple      Live Births               Home Medications    Prior to Admission medications   Medication Sig Start Date End Date  Taking? Authorizing Provider  Acidophilus Lactobacillus CAPS Take 1 tablet by mouth daily.   Yes [provider]  albuterol (PROVENTIL) (2.5 MG/3ML) 0.083% nebulizer solution Take 3 mLs (2.5 mg total) by nebulization every 6 (six) hours as needed for wheezing or shortness of breath. 07/25/21  Yes Cook, Jayce G, DO  amLODipine (NORVASC) 10 MG tablet Take 1 tablet (10 mg total) by mouth daily. 09/05/21  Yes Cannady, Henrine Screws T, NP  aspirin 81 MG EC tablet Take 1 tablet (81 mg total) by mouth daily. Swallow whole. 11/09/21  Yes Vigg, Avanti, MD  Blood Glucose Monitoring Suppl (ONETOUCH VERIO) w/Device KIT Use to check blood sugar 3 times daily with goal <130 fasting and <180 two hours after meal.  Bring meter to visits. 01/25/21  Yes Cannady, Jolene T, NP  busPIRone (BUSPAR) 5 MG tablet Take 1 tablet (5 mg total) by mouth 2 (two)  times daily. 09/05/21  Yes Cannady, Henrine Screws T, NP  dapagliflozin propanediol (FARXIGA) 10 MG TABS tablet Take 1 tablet (10 mg total) by mouth daily before breakfast. 03/16/21  Yes Cannady, Jolene T, NP  DULoxetine (CYMBALTA) 30 MG capsule Take 30 mg by mouth 2 (two) times daily. 10/25/21  Yes [provider]  famotidine (PEPCID) 20 MG tablet Take 20 mg by mouth 2 (two) times daily. 02/15/22  Yes [provider]  Fluticasone-Umeclidin-Vilant (TRELEGY ELLIPTA) 100-62.5-25 MCG/INH AEPB Inhale 1 puff into the lungs daily. 11/29/20  Yes [provider]  hydrochlorothiazide (HYDRODIURIL) 25 MG tablet Take 1 tablet (25 mg total) by mouth daily. 11/22/21  Yes Minna Merritts, MD  hydrOXYzine (VISTARIL) 25 MG capsule TAKE 1 CAPSULE BY MOUTH EVERY 8 HOURS ASNEEDED 01/03/22  Yes Kathrine Haddock, NP  leflunomide (ARAVA) 20 MG tablet Take 20 mg by mouth daily. 01/22/21  Yes [provider]  levothyroxine (SYNTHROID) 50 MCG tablet Take 50 mcg by mouth daily before breakfast.    Yes [provider]  montelukast (SINGULAIR) 10 MG tablet Take 10 mg by mouth daily. 02/15/22  Yes [provider]  nirmatrelvir/ritonavir EUA (PAXLOVID) 20 x 150 MG & 10 x 100MG TABS Take 3 tablets by mouth 2 (two) times daily for 5 days. Patient GFR is 60. Take nirmatrelvir (150 mg) two tablets twice daily for 5 days and ritonavir (100 mg) one tablet twice daily for 5 days. 06/23/22 06/28/22 Yes Laurene Footman B, PA-C  oxyCODONE (OXY IR/ROXICODONE) 5 MG immediate release tablet Take 1-2 tablets (5-10 mg total) by mouth every 6 (six) hours as needed for moderate pain or severe pain. 03/15/22  Yes Wouk, Ailene Rud, MD  polyethylene glycol (MIRALAX) 17 g packet Take 17 g by mouth daily. 03/15/22  Yes Wouk, Ailene Rud, MD  predniSONE (DELTASONE) 20 MG tablet Take 2 tablets (40 mg total) by mouth daily for 5 days. 06/23/22 06/28/22 Yes Danton Clap, PA-C  promethazine-dextromethorphan (PROMETHAZINE-DM)  6.25-15 MG/5ML syrup Take 5 mLs by mouth 4 (four) times daily as needed. 06/23/22  Yes Laurene Footman B, PA-C  sitaGLIPtin (JANUVIA) 100 MG tablet Take 1 tablet (100 mg total) by mouth daily. 12/02/21  Yes Cannady, Jolene T, NP  albuterol (VENTOLIN HFA) 108 (90 Base) MCG/ACT inhaler Inhale 2 puffs into the lungs every 4 (four) hours as needed.  02/12/19 11/22/21  [provider]  cetirizine (ZYRTEC) 10 MG tablet Take 1 tablet (10 mg total) by mouth daily. 01/03/22   Kathrine Haddock, NP  cloNIDine (CATAPRES) 0.1 MG tablet Take 1 tablet (0.1 mg total) by  mouth 2 (two) times daily. 11/22/21   Minna Merritts, MD  EPINEPHrine 0.3 mg/0.3 mL IJ SOAJ injection Inject 0.3 mg into the muscle as needed for anaphylaxis. 02/15/22   Marnee Guarneri T, NP  glucose blood test strip Use to check blood sugar 3 times daily with goal <130 fasting and <180 two hours after meal. 01/25/21   Cannady, Jolene T, NP  golimumab (SIMPONI ARIA) 50 MG/4ML SOLN injection Inject into the vein.    [provider]  ibuprofen (ADVIL) 600 MG tablet Take 600 mg by mouth 3 (three) times daily. 02/15/22   [provider]  ipratropium-albuterol (DUONEB) 0.5-2.5 (3) MG/3ML SOLN Take 3 mLs by nebulization every 6 (six) hours as needed for up to 7 days. 10/30/21 11/22/21  Laurene Footman B, PA-C  Lancets [facility] ULTRASOFT) lancets Use to check blood sugar 3 times daily with goal <130 fasting and <180 two hours after meal. 01/25/21   Cannady, Jolene T, NP  metroNIDAZOLE (FLAGYL) 500 MG tablet Take 1 tablet (500 mg total) by mouth 2 (two) times daily. 03/15/22   Wouk, Ailene Rud, MD  traZODone (DESYREL) 50 MG tablet Take 1 tablet (50 mg total) by mouth at bedtime. 01/05/20 07/01/20  Venita Lick, NP    Family History Family History  Problem Relation Age of Onset   Lung cancer Mother    Diabetes Mother    Breast cancer Mother 91   Hypertension Father    Cancer Maternal Aunt    Ovarian cancer Maternal Aunt    Diabetes  Maternal Aunt    Diabetes Maternal Uncle    Hypertension Maternal Grandmother    Diabetes Maternal Grandmother     Social History Social History   Tobacco Use   Smoking status: Every Day    Packs/day: 1.00    Years: 34.00    Total pack years: 34.00    Types: Cigarettes   Smokeless tobacco: Never   Tobacco comments:    2-3 cig/day  Vaping Use   Vaping Use: Never used  Substance Use Topics   Alcohol use: Yes    Comment: Occ   Drug use: Not Currently     Allergies   Azithromycin, Molds & smuts, Alpha-gal, Penicillins, and Shellfish allergy   Review of Systems Review of Systems  Constitutional:  Positive for fatigue. Negative for chills, diaphoresis and fever.  HENT:  Positive for congestion and rhinorrhea. Negative for ear pain, sinus pressure, sinus pain and sore throat.   Respiratory:  Positive for cough and shortness of breath.   Cardiovascular:  Negative for chest pain.  Gastrointestinal:  Negative for abdominal pain, nausea and vomiting.  Musculoskeletal:  Negative for arthralgias and myalgias.  Skin:  Negative for rash.  Neurological:  Negative for weakness and headaches.  Hematological:  Negative for adenopathy.     Physical Exam Triage Vital Signs ED Triage Vitals  Enc Vitals Group     BP      Pulse      Resp      Temp      Temp src      SpO2      Weight      Height      Head Circumference      Peak Flow      Pain Score      Pain Loc      Pain Edu?      Excl. in [place]?    No data found.  Updated Vital Signs BP 119/87 (  BP Location: Left Arm)   Pulse 98   Temp 98.1 F (36.7 C) (Oral)   Resp 16   Ht _0  (1.6 m)   Wt 175 lb 14.8 oz (79.8 kg)   SpO2 96%   BMI 31.16 kg/m      Physical Exam Vitals and nursing note reviewed.  Constitutional:      General: She is not in acute distress.    Appearance: Normal appearance. She is ill-appearing. She is not toxic-appearing.  HENT:     Head: Normocephalic and atraumatic.     Right Ear:  Tympanic membrane, ear canal and external ear normal.     Left Ear: Tympanic membrane, ear canal and external ear normal.     Nose: Congestion present.     Mouth/Throat:     Mouth: Mucous membranes are moist.     Pharynx: Oropharynx is clear.  Eyes:     General: No scleral icterus.       Right eye: No discharge.        Left eye: No discharge.     Conjunctiva/sclera: Conjunctivae normal.  Cardiovascular:     Rate and Rhythm: Normal rate and regular rhythm.     Heart sounds: Normal heart sounds.  Pulmonary:     Effort: Pulmonary effort is normal. No respiratory distress.     Breath sounds: Normal breath sounds. No wheezing, rhonchi or rales.  Musculoskeletal:     Cervical back: Neck supple.  Skin:    General: Skin is dry.  Neurological:     General: No focal deficit present.     Mental Status: She is alert. Mental status is at baseline.     Motor: No weakness.     Gait: Gait normal.  Psychiatric:        Mood and Affect: Mood normal.        Behavior: Behavior normal.        Thought Content: Thought content normal.      UC Treatments / Results  Labs (all labs ordered are listed, but only abnormal results are displayed) Labs Reviewed  SARS CORONAVIRUS 2 BY RT PCR - Abnormal; Notable for the following components:      Result Value   SARS Coronavirus 2 by RT PCR POSITIVE (*)    All other components within normal limits    EKG   Radiology No results found.  Procedures Procedures (including critical care time)  Medications Ordered in UC Medications - No data to display  Initial Impression / Assessment and Plan / UC Course  I have reviewed the triage vital signs and the nursing notes.  Pertinent labs & imaging results that were available during my care of the patient were reviewed by me and considered in my medical decision making (see chart for details).   51 year old female with history of asthma/COPD, rheumatoid arthritis, lupus and diabetes presents for onset of  cough, congestion and shortness of breath yesterday.  Positive COVID exposure and recent travel to [place].  Levels are normal and stable.  She is mildly ill-appearing nontoxic.  On exam, she is in no acute distress.  She has mild nasal congestion.  Chest clear auscultation heart regular rate and rhythm.  PCR COVID test performed and positive.  Discussed result with patient.  Patient is definitely candidate for antiviral therapy as lives at [facility] since she had recent CMP performed which showed GFR greater than 60.  No medication interactions found per her medication list.  Also sent prednisone and Promethazine  DM.  Has history of COPD and questionable mild COPD exacerbation at this time.  Reviewed supportive care.  Reviewed current CDC guidelines, isolation protocol and ED precautions.  Encouraged her to get an O2 sensor and if her oxygen gets low where she has increased shortness of breath or worsening symptoms to call 911 or go to the ER.   Final Clinical Impressions(s) / UC Diagnoses   Final diagnoses:  COVID-19  Acute cough  Chronic obstructive pulmonary disease, unspecified COPD type ([place])  Exposure to COVID-19 virus     Discharge Instructions      -Positive COVID.  I sent Paxlovid to the pharmacy.  This should reduce your risk of hospitalization and getting very ill. - I have sent cough medication, prednisone to the pharmacy.  Continue to use your nebulizer machine and inhalers. - Purchase an O2 sensor and if your oxygen goes to 90% or you have increased shortness of breath, please call 911 or asthma to ED ER. - Make sure you isolate 5 days from symptom onset and wear mask for 5 days. - Follow-up with your PCP and specialist.     ED Prescriptions     Medication Sig Dispense Auth. Provider   nirmatrelvir/ritonavir EUA (PAXLOVID) 20 x 150 MG & 10 x 100MG TABS Take 3 tablets by mouth 2 (two) times daily for 5 days. Patient GFR is 60. Take nirmatrelvir (150 mg) two tablets twice  daily for 5 days and ritonavir (100 mg) one tablet twice daily for 5 days. 30 tablet Danton Clap, PA-C   promethazine-dextromethorphan (PROMETHAZINE-DM) 6.25-15 MG/5ML syrup Take 5 mLs by mouth 4 (four) times daily as needed. 118 mL Laurene Footman B, PA-C   predniSONE (DELTASONE) 20 MG tablet Take 2 tablets (40 mg total) by mouth daily for 5 days. 10 tablet Danton Clap, PA-C      I have reviewed the PDMP during this encounter.   Danton Clap, PA-C 06/23/22 1205

## 2022-06-23 NOTE — ED Triage Notes (Signed)
Pt c/o cough, nasal congestion, headache. Started about 2 days ago. Denies fever. She states she was around several people with covid during travel.

## 2022-06-23 NOTE — Discharge Instructions (Signed)
-  Positive COVID.  I sent Paxlovid to the pharmacy.  This should reduce your risk of hospitalization and getting very ill. - I have sent cough medication, prednisone to the pharmacy.  Continue to use your nebulizer machine and inhalers. - Purchase an O2 sensor and if your oxygen goes to 90% or you have increased shortness of breath, please call 911 or asthma to ED ER. - Make sure you isolate 5 days from symptom onset and wear mask for 5 days. - Follow-up with your PCP and specialist.

## 2022-07-03 ENCOUNTER — Ambulatory Visit
Admission: EM | Admit: 2022-07-03 | Discharge: 2022-07-03 | Disposition: A | Discharge status: Home / Self Care | Payer: Medicare Other

## 2022-07-03 ENCOUNTER — Encounter: Payer: Self-pay | Admitting: Emergency Medicine

## 2022-07-03 DIAGNOSIS — R238 Other skin changes: Secondary | ICD-10-CM | POA: Diagnosis not present

## 2022-07-03 NOTE — ED Triage Notes (Signed)
Pt presents with a blister to her outer right thigh since last night

## 2022-07-03 NOTE — ED Provider Notes (Signed)
[facility]    CSN: [id number] Arrival date & time: 07/03/22  0037      History   Chief Complaint Chief Complaint  Patient presents with   Blister    HPI Catherine Giles is a 51 y.o. female presenting for an area of swelling to the right thigh since last night. She believes she may have gotten stung by an insect but is not sure.  Denies burn.  She says it happened right after she was outdoors.  She reports she noticed her skin was little red and then it turned into a blister.  She denies any associated pain or itching.  She says she just wanted to have the area looked at make sure it was not due to a spider bite as her father passed away from a spider bite.  She has not had any drainage from the area or fever.  She has not tried treat condition in any way.  No similar problems in the past.  No other complaints.  HPI  Past Medical History:  Diagnosis Date   Arthritis    RA   Asthma    COPD (chronic obstructive pulmonary disease) ([place])    Depression    Diabetes mellitus without complication ([place])    Emphysema of lung ([place])    Family history of breast cancer    6/21 cancer genetic testing letter sent   Fibromyalgia    Hypertension    Lupus ([place])    Pre-diabetes    Thyroid disease     Patient Active Problem List   Diagnosis Date Noted   S/P cholecystectomy 03/12/2022   Diabetes mellitus without complication (HCC)    COPD (chronic obstructive pulmonary disease) (HCC)    Hypothyroidism    Nicotine dependence, cigarettes, uncomplicated    Chronic diastolic CHF (congestive heart failure) ([place])    Acute cholecystitis    Allergy to alpha-gal 01/07/2022   Depression, major, single episode, mild ([place]) 01/24/2021   Dairy allergy 10/12/2020   Vaginal stenosis 03/19/2020   Obesity 03/19/2020   Insomnia 01/05/2020   Complex regional pain syndrome I of upper limb 12/17/2019   Hypertension associated with diabetes ([place]) 12/13/2019   Centrilobular emphysema ([place])  12/13/2019   Type 2 diabetes mellitus with obesity ([place]) 12/13/2019   Hashimoto's thyroiditis 12/13/2019   DDD (degenerative disc disease), lumbar 08/26/2019   Multinodular goiter 08/14/2019   Chronic left hip pain 05/05/2019   Osteoarthritis of spine with radiculopathy, cervical region 05/05/2019   Polypharmacy 02/14/2019   Fibromyalgia 02/10/2019   Rheumatoid arthritis involving multiple sites with positive rheumatoid factor ([place]) 02/10/2019   Cervical radiculopathy 03/13/2018   Vitamin D deficiency 10/11/2015    Past Surgical History:  Procedure Laterality Date   MOUTH SURGERY     TRANSTHORACIC ECHOCARDIOGRAM  04/2020   EF 60 to 65%.  Normal LV function.  No R WMA.  GR 1 DD.  Normal RV size and function.  Normal PAP.  Normal atrial size.  Normal valves.    OB History     Gravida  9   Para  6   Term  0   Preterm  0   AB  3   Living         SAB  2   IAB  0   Ectopic  0   Multiple      Live Births               Home Medications    Prior to  Admission medications   Medication Sig Start Date End Date Taking? Authorizing Provider  Acidophilus Lactobacillus CAPS Take 1 tablet by mouth daily.    [provider]  albuterol (PROVENTIL) (2.5 MG/3ML) 0.083% nebulizer solution Take 3 mLs (2.5 mg total) by nebulization every 6 (six) hours as needed for wheezing or shortness of breath. 07/25/21   Coral Spikes, DO  albuterol (VENTOLIN HFA) 108 (90 Base) MCG/ACT inhaler Inhale 2 puffs into the lungs every 4 (four) hours as needed.  02/12/19 11/22/21  [provider]  amLODipine (NORVASC) 10 MG tablet Take 1 tablet (10 mg total) by mouth daily. 09/05/21   Marnee Guarneri T, NP  aspirin 81 MG EC tablet Take 1 tablet (81 mg total) by mouth daily. Swallow whole. 11/09/21   Vigg, Avanti, MD  Blood Glucose Monitoring Suppl (ONETOUCH VERIO) w/Device KIT Use to check blood sugar 3 times daily with goal <130 fasting and <180 two hours after meal.  Bring meter to  visits. 01/25/21   Cannady, Henrine Screws T, NP  busPIRone (BUSPAR) 5 MG tablet Take 1 tablet (5 mg total) by mouth 2 (two) times daily. 09/05/21   Cannady, Henrine Screws T, NP  cetirizine (ZYRTEC) 10 MG tablet Take 1 tablet (10 mg total) by mouth daily. 01/03/22   Kathrine Haddock, NP  cloNIDine (CATAPRES) 0.1 MG tablet Take 1 tablet (0.1 mg total) by mouth 2 (two) times daily. 11/22/21   Minna Merritts, MD  dapagliflozin propanediol (FARXIGA) 10 MG TABS tablet Take 1 tablet (10 mg total) by mouth daily before breakfast. 03/16/21   Cannady, Henrine Screws T, NP  DULoxetine (CYMBALTA) 30 MG capsule Take 30 mg by mouth 2 (two) times daily. 10/25/21   [provider]  EPINEPHrine 0.3 mg/0.3 mL IJ SOAJ injection Inject 0.3 mg into the muscle as needed for anaphylaxis. 02/15/22   Cannady, Henrine Screws T, NP  famotidine (PEPCID) 20 MG tablet Take 20 mg by mouth 2 (two) times daily. 02/15/22   [provider]  Fluticasone-Umeclidin-Vilant (TRELEGY ELLIPTA) 100-62.5-25 MCG/INH AEPB Inhale 1 puff into the lungs daily. 11/29/20   [provider]  glucose blood test strip Use to check blood sugar 3 times daily with goal <130 fasting and <180 two hours after meal. 01/25/21   Cannady, Jolene T, NP  golimumab (SIMPONI ARIA) 50 MG/4ML SOLN injection Inject into the vein.    [provider]  hydrochlorothiazide (HYDRODIURIL) 25 MG tablet Take 1 tablet (25 mg total) by mouth daily. 11/22/21   Minna Merritts, MD  hydrOXYzine (VISTARIL) 25 MG capsule TAKE 1 CAPSULE BY MOUTH EVERY 8 HOURS ASNEEDED 01/03/22   Kathrine Haddock, NP  ibuprofen (ADVIL) 600 MG tablet Take 600 mg by mouth 3 (three) times daily. 02/15/22   [provider]  ipratropium-albuterol (DUONEB) 0.5-2.5 (3) MG/3ML SOLN Take 3 mLs by nebulization every 6 (six) hours as needed for up to 7 days. 10/30/21 11/22/21  Laurene Footman B, PA-C  Lancets [facility] ULTRASOFT) lancets Use to check blood sugar 3 times daily with goal <130 fasting and <180 two hours  after meal. 01/25/21   Cannady, Jolene T, NP  leflunomide (ARAVA) 20 MG tablet Take 20 mg by mouth daily. 01/22/21   [provider]  levothyroxine (SYNTHROID) 50 MCG tablet Take 50 mcg by mouth daily before breakfast.     [provider]  metroNIDAZOLE (FLAGYL) 500 MG tablet Take 1 tablet (500 mg total) by mouth 2 (two) times daily. 03/15/22   Wouk, Ailene Rud, MD  montelukast (  SINGULAIR) 10 MG tablet Take 10 mg by mouth daily. 02/15/22   [provider]  oxyCODONE (OXY IR/ROXICODONE) 5 MG immediate release tablet Take 1-2 tablets (5-10 mg total) by mouth every 6 (six) hours as needed for moderate pain or severe pain. 03/15/22   Wouk, Ailene Rud, MD  polyethylene glycol (MIRALAX) 17 g packet Take 17 g by mouth daily. 03/15/22   Wouk, Ailene Rud, MD  promethazine-dextromethorphan (PROMETHAZINE-DM) 6.25-15 MG/5ML syrup Take 5 mLs by mouth 4 (four) times daily as needed. 06/23/22   Laurene Footman B, PA-C  sitaGLIPtin (JANUVIA) 100 MG tablet Take 1 tablet (100 mg total) by mouth daily. 12/02/21   Cannady, Henrine Screws T, NP  traZODone (DESYREL) 50 MG tablet Take 1 tablet (50 mg total) by mouth at bedtime. 01/05/20 07/01/20  Venita Lick, NP    Family History Family History  Problem Relation Age of Onset   Lung cancer Mother    Diabetes Mother    Breast cancer Mother 63   Hypertension Father    Cancer Maternal Aunt    Ovarian cancer Maternal Aunt    Diabetes Maternal Aunt    Diabetes Maternal Uncle    Hypertension Maternal Grandmother    Diabetes Maternal Grandmother     Social History Social History   Tobacco Use   Smoking status: Every Day    Packs/day: 1.00    Years: 34.00    Total pack years: 34.00    Types: Cigarettes   Smokeless tobacco: Never   Tobacco comments:    2-3 cig/day  Vaping Use   Vaping Use: Never used  Substance Use Topics   Alcohol use: Yes    Comment: Occ   Drug use: Not Currently     Allergies   Azithromycin, Molds & smuts,  Alpha-gal, Penicillins, and Shellfish allergy   Review of Systems Review of Systems  Constitutional:  Negative for fatigue and fever.  Musculoskeletal:  Negative for arthralgias.  Skin:  Positive for color change. Negative for rash and wound.     Physical Exam Triage Vital Signs ED Triage Vitals  Enc Vitals Group     BP      Pulse      Resp      Temp      Temp src      SpO2      Weight      Height      Head Circumference      Peak Flow      Pain Score      Pain Loc      Pain Edu?      Excl. in [place]?    No data found.  Updated Vital Signs BP (!) 147/99 (BP Location: Right Arm)   Pulse 91   Temp 98.6 F (37 C) (Oral)   Resp 16   SpO2 94%      Physical Exam Vitals and nursing note reviewed.  Constitutional:      General: She is not in acute distress.    Appearance: Normal appearance. She is not ill-appearing or toxic-appearing.  HENT:     Head: Normocephalic and atraumatic.  Eyes:     General: No scleral icterus.       Right eye: No discharge.        Left eye: No discharge.     Conjunctiva/sclera: Conjunctivae normal.  Cardiovascular:     Rate and Rhythm: Normal rate and regular rhythm.  Pulmonary:     Effort: Pulmonary effort is  normal. No respiratory distress.  Musculoskeletal:     Cervical back: Neck supple.  Skin:    General: Skin is dry.     Comments: Quarter sized bulla of right lateral thigh.  No surrounding erythema or warmth.  No tenderness.  Neurological:     General: No focal deficit present.     Mental Status: She is alert. Mental status is at baseline.     Motor: No weakness.     Gait: Gait normal.  Psychiatric:        Mood and Affect: Mood normal.        Behavior: Behavior normal.        Thought Content: Thought content normal.      UC Treatments / Results  Labs (all labs ordered are listed, but only abnormal results are displayed) Labs Reviewed - No data to display  EKG   Radiology No results  found.  Procedures Procedures (including critical care time)  Medications Ordered in UC Medications - No data to display  Initial Impression / Assessment and Plan / UC Course  I have reviewed the triage vital signs and the nursing notes.  Pertinent labs & imaging results that were available during my care of the patient were reviewed by me and considered in my medical decision making (see chart for details).   50 year old female presenting for a large blister of the right lateral thigh that she noticed last night.  She says she noticed it after she was outside.  Believes she was possibly bitten by an insect but is not sure.  Denies any possibility of burn.  Has not treated condition in any way.  On exam she does have a large, quarter sized blister/bulla of the right lateral thigh.  It is nontender and does not appear to be infected.  Suspect probable insect bite.  Offered I&D of the blister but she declined stating that she will just "take care of it in the shower."  Advised her that it would likely drain on its own at some point.  Encouraged her to follow-up if it becomes painful, or erythematous, warm or she has fever.  She declines AVS.   Final Clinical Impressions(s) / UC Diagnoses   Final diagnoses:  Skin bulla   Discharge Instructions   None    ED Prescriptions   None    PDMP not reviewed this encounter.   Danton Clap, PA-C 07/03/22 [phone]

## 2022-07-04 IMAGING — CT CT ANGIO CHEST
2 of 6 series · 18 of 46 positions shown · IV contrast (APPLIED)
Comparison: Chest x-ray from same day.

CLINICAL DATA: Shortness of breath and pleuritic chest pain with
productive cough.

EXAM:
CT ANGIOGRAPHY CHEST WITH CONTRAST
TECHNIQUE: Multidetector CT imaging of the chest was performed using the
standard protocol during bolus administration of intravenous
contrast. Multiplanar CT image reconstructions and MIPs were
obtained to evaluate the vascular anatomy.
CONTRAST:  75mL OMNIPAQUE IOHEXOL 350 MG/ML SOLN

[Series 5: thins · axial · 0.66mm/px · z∈[-267,-17]mm · 16 of 274 slices shown]
[im 12/274  lung]
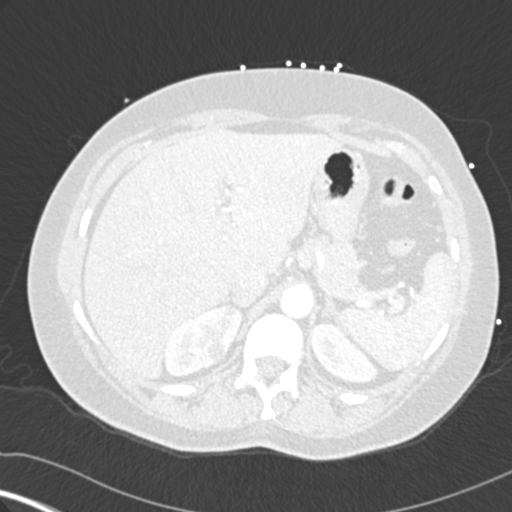
[im 36/274  soft-tissue]
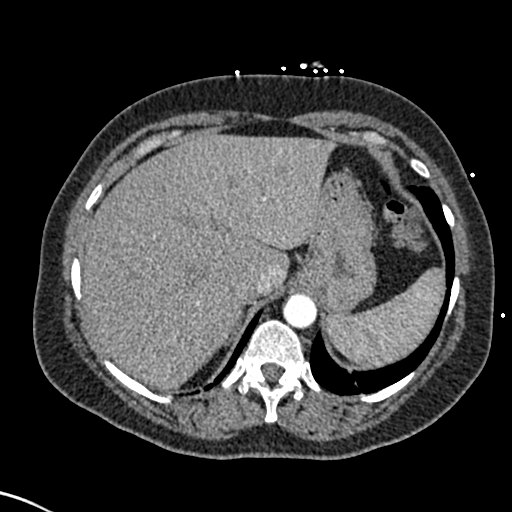
[im 48/274  lung]
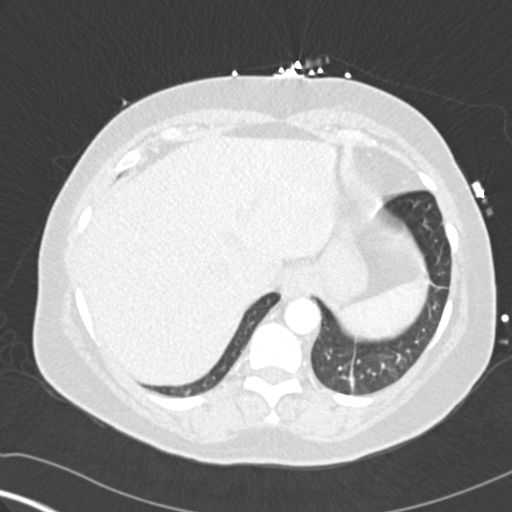
[im 60/274  soft-tissue]
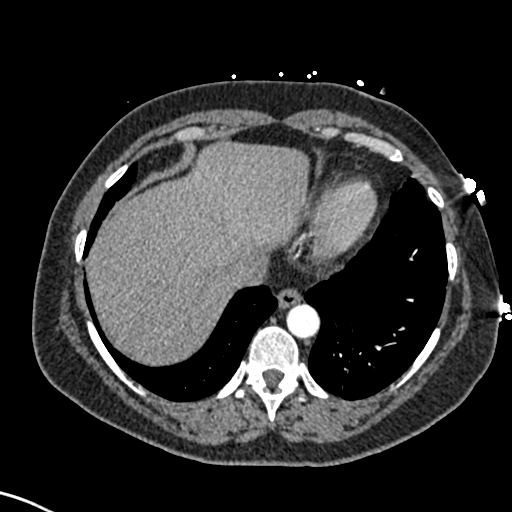
[im 84/274  lung]
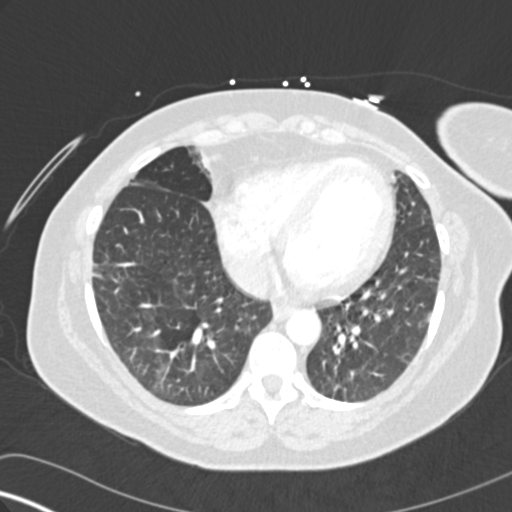
[im 95/274  soft-tissue]
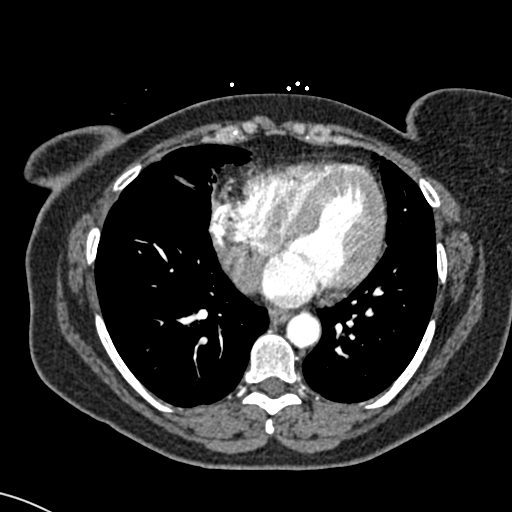
[im 107/274  lung]
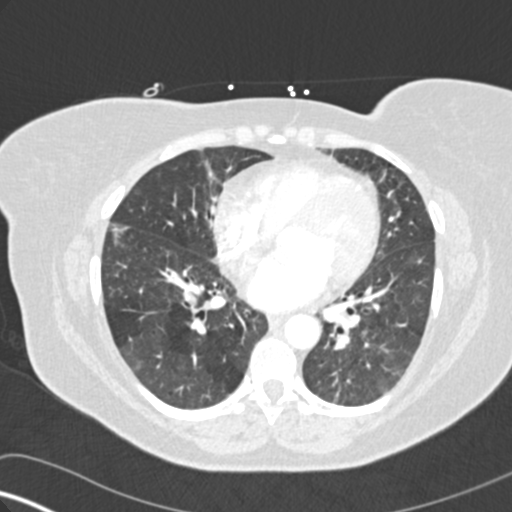
[im 131/274  soft-tissue]
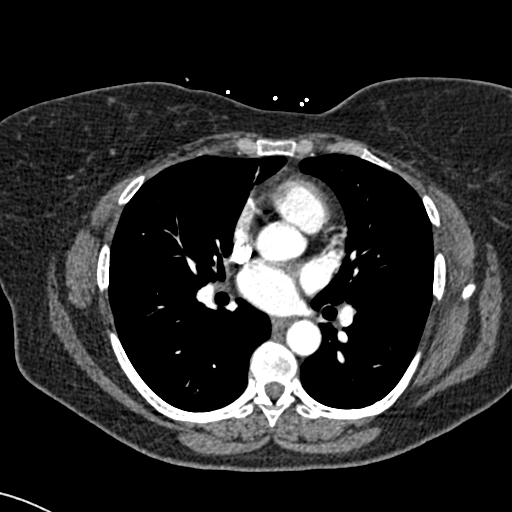
[im 143/274  lung]
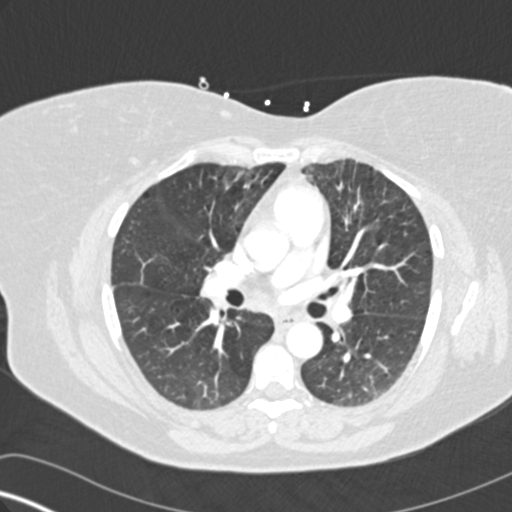
[im 167/274  soft-tissue]
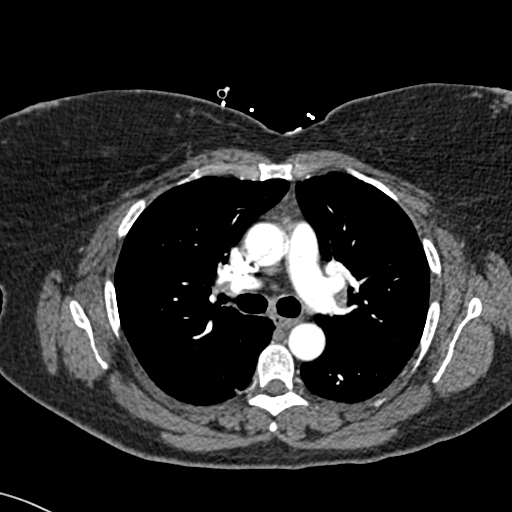
[im 179/274  lung]
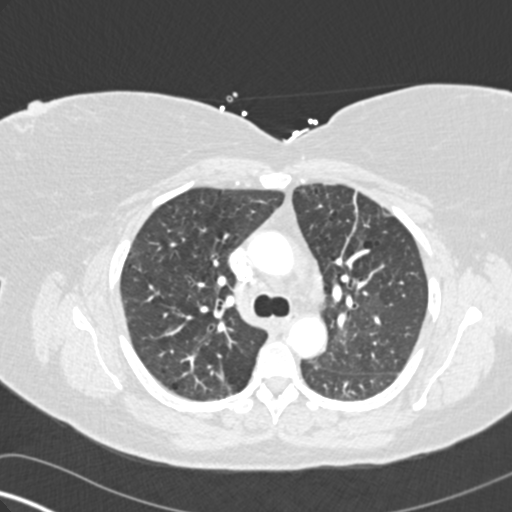
[im 190/274  soft-tissue]
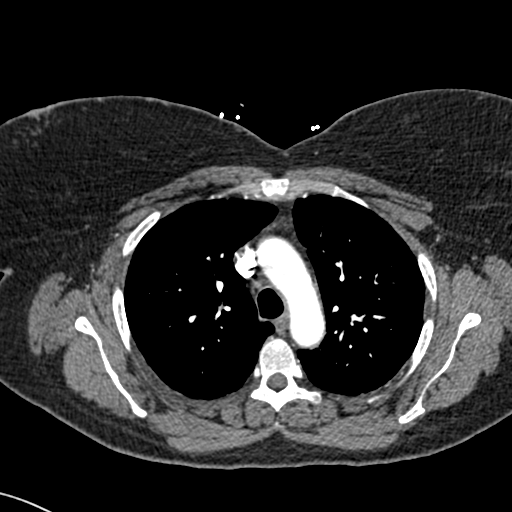
[im 214/274  lung]
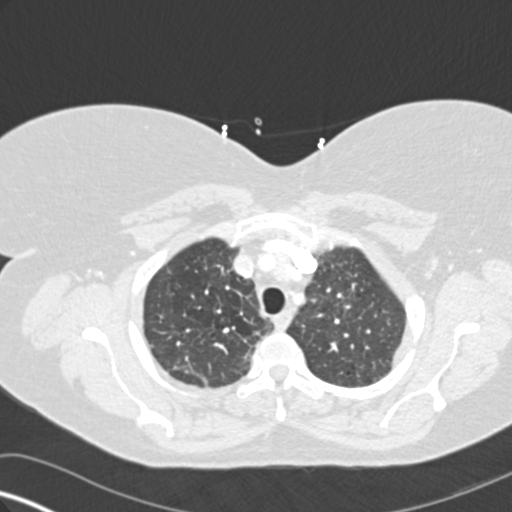
[im 226/274  soft-tissue]
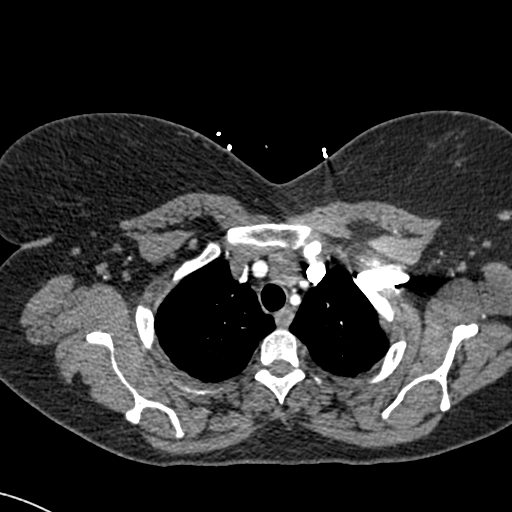
[im 238/274  lung]
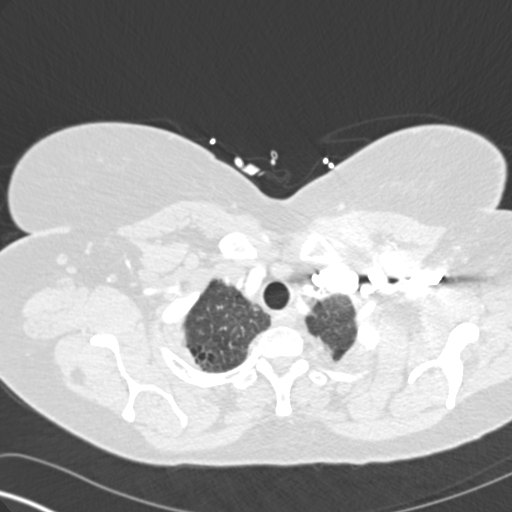
[im 262/274  soft-tissue]
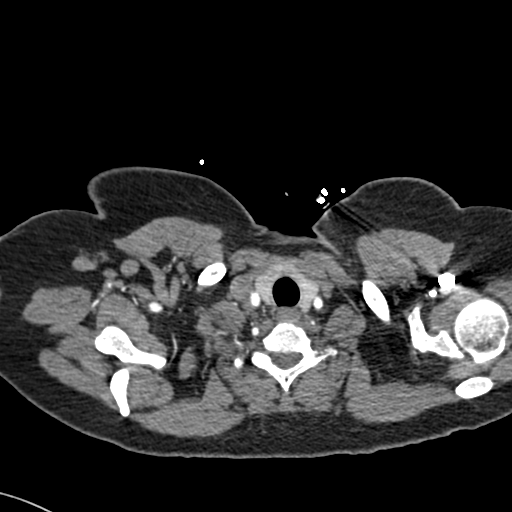

[Series 7: coronal mpr · coronal · 0.54mm/px · 2 of 89 slices shown]
[im 30/89  soft-tissue]
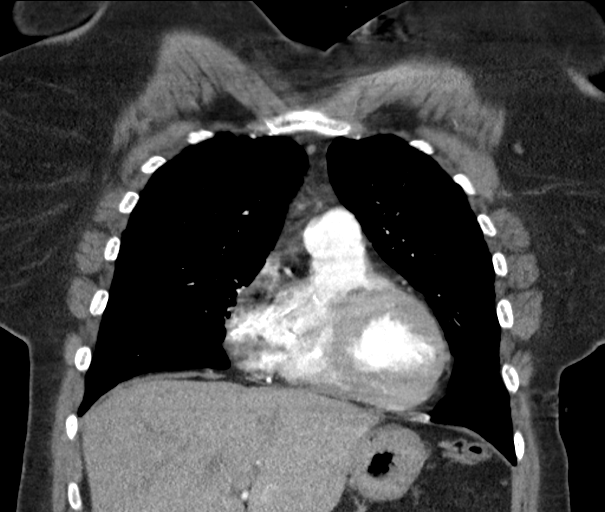
[im 59/89  soft-tissue]
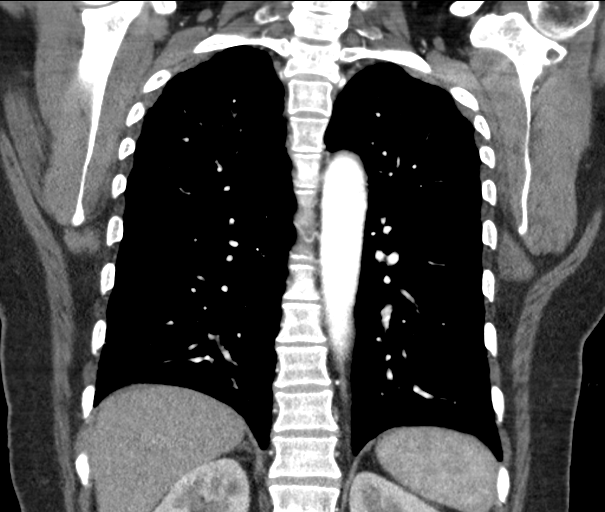

[18 of 46 positions shown; findings below may reference images not displayed]

FINDINGS: Cardiovascular: Satisfactory opacification of the pulmonary arteries
to the segmental level. No evidence of pulmonary embolism. Normal
heart size. No pericardial effusion. No thoracic aortic aneurysm or
dissection.

Mediastinum/Nodes: There are a few borderline enlarged bilateral
axillary lymph nodes measuring up to 11 mm in short axis (series 4,
image 6). No enlarged mediastinal or hilar lymph nodes. 2.5 x 1.9 cm
mass in the superior mediastinum between the brachiocephalic and
left common carotid arteries (series 4, image 19). The mass is
separate from, but just below the thyroid gland, which is normal in
appearance. The trachea and esophagus demonstrate no significant
findings.

Lungs/Pleura: No focal consolidation, pleural effusion, or
pneumothorax. Mild paraseptal emphysema. Mild mosaic attenuation.
Scattered areas of linear scarring throughout both lungs. Small
calcified granuloma in the right lower lobe. No suspicious pulmonary
nodule.

Upper Abdomen: No acute abnormality. Subtle geographic hypodensity
involving the right liver as compared to the left liver is likely
perfusional. 2.0 cm enhancing lesion in the caudate lobe (series 4,
image 81). Small bilateral renal cysts.

Musculoskeletal: No chest wall abnormality. No acute or significant
osseous findings.

Review of the MIP images confirms the above findings.
IMPRESSION: 1. No evidence of pulmonary embolism. No acute intrathoracic
process.
2. Mosaic attenuation may reflect small airways disease.
3. Indeterminate 2.5 x 1.9 cm mass in the superior mediastinum. The
mass is separate from, but just below the thyroid gland, which is
normal in appearance. Differential considerations include lymphoma,
thymoma, nodal metastasis, or accessory thyroid. Recommend further
evaluation with outpatient thyroid ultrasound with attention on the
infrathyroid region. Tissue sampling may ultimately be needed for
definitive diagnosis.
4. Indeterminate 2.0 cm enhancing lesion in the caudate lobe of the
liver. Recommend further evaluation with outpatient liver protocol
MRI of the abdomen with and without contrast.
5. Few borderline enlarged bilateral axillary lymph nodes measuring
up to 11 mm in short axis, nonspecific.
6. Emphysema ([id number]).

## 2022-07-10 ENCOUNTER — Other Ambulatory Visit: Payer: Self-pay | Admitting: *Deleted

## 2022-07-10 DIAGNOSIS — Z122 Encounter for screening for malignant neoplasm of respiratory organs: Secondary | ICD-10-CM

## 2022-07-10 DIAGNOSIS — F17210 Nicotine dependence, cigarettes, uncomplicated: Secondary | ICD-10-CM

## 2022-07-10 DIAGNOSIS — Z87891 Personal history of nicotine dependence: Secondary | ICD-10-CM

## 2022-07-18 ENCOUNTER — Emergency Department
Admission: EM | Admit: 2022-07-18 | Discharge: 2022-07-18 | Discharge status: Against Medical Advice | Payer: Medicare Other | Attending: Emergency Medicine | Admitting: Emergency Medicine

## 2022-07-18 ENCOUNTER — Other Ambulatory Visit: Payer: Self-pay

## 2022-07-18 ENCOUNTER — Encounter: Payer: Self-pay | Admitting: *Deleted

## 2022-07-18 DIAGNOSIS — T7840XA Allergy, unspecified, initial encounter: Secondary | ICD-10-CM | POA: Diagnosis not present

## 2022-07-18 DIAGNOSIS — Z5321 Procedure and treatment not carried out due to patient leaving prior to being seen by health care provider: Secondary | ICD-10-CM | POA: Diagnosis not present

## 2022-07-18 LAB — BASIC METABOLIC PANEL
Anion gap: 14 (ref 5–15)
BUN: 10 mg/dL (ref 6–20)
CO2: 19 mmol/L — ABNORMAL LOW (ref 22–32)
Calcium: 9.4 mg/dL (ref 8.9–10.3)
Chloride: 107 mmol/L (ref 98–111)
Creatinine, Ser: 0.43 mg/dL — ABNORMAL LOW (ref 0.44–1.00)
GFR, Estimated: >60 mL/min (ref >60)
Glucose, Bld: 115 mg/dL — ABNORMAL HIGH (ref 70–99)
Potassium: 3.3 mmol/L — ABNORMAL LOW (ref 3.5–5.1)
Sodium: 140 mmol/L (ref 135–145)

## 2022-07-18 LAB — CBC
HCT: 47.7 % — ABNORMAL HIGH (ref 36.0–46.0)
Hemoglobin: 14.8 g/dL (ref 12.0–15.0)
MCH: 30.5 pg (ref 26.0–34.0)
MCHC: 31 g/dL (ref 30.0–36.0)
MCV: 98.1 fL (ref 80.0–100.0)
Platelets: 316 10*3/uL (ref 150–400)
RBC: 4.86 MIL/uL (ref 3.87–5.11)
RDW: 14.5 % (ref 11.5–15.5)
WBC: 13.8 10*3/uL — ABNORMAL HIGH (ref 4.0–10.5)
nRBC: 0 % (ref 0.0–0.2)

## 2022-07-18 LAB — TROPONIN I (HIGH SENSITIVITY): Troponin I (High Sensitivity): 3 ng/L (ref <18)

## 2022-07-18 NOTE — ED Triage Notes (Signed)
Pt brought in via ems.  Pt states her esophagus is hurting  pt reports eating red meat and started having an allergic reaction.   Pt has an iv in place  epi and pepcid given by ems.  Pt tearful.  No rash. Pt alert.

## 2022-07-18 NOTE — ED Provider Triage Note (Signed)
Emergency Medicine Provider Triage Evaluation Note  Catherine Giles , a 51 y.o. female  was evaluated in triage.  Pt complains of "pain in my esophagus." Tearful. Arrived via EMS who report allergic reaction to beef. She tasted a small piece before symptoms started. She received EPI enroute. No rash or difficulty speaking/swallowing at this time.  Physical Exam  There were no vitals taken for this visit. Gen:   Awake, no distress   Resp:  Normal effort  MSK:   Moves extremities without difficulty  Other:    Medical Decision Making  Medically screening exam initiated at 6:01 PM.  Appropriate orders placed.  Catherine Giles was informed that the remainder of the evaluation will be completed by another provider, this initial triage assessment does not replace that evaluation, and the importance of remaining in the ED until their evaluation is complete.    Victorino Dike, FNP 07/18/22 [phone]

## 2022-07-18 NOTE — ED Notes (Signed)
Iv removed tin triage per pt request.  Pt c/o pain and cursing at staffing triage.

## 2022-07-18 NOTE — ED Triage Notes (Signed)
First Nurse Note:  Arrives via [facility].  Allergic reaction to [employer].  C/O red and watery eyes, chest constriction and dypnea.  EPi 0.3 mg IM given and 20 mg Pepcid IV. Since given all symptoms have resolved.  VS wnl.  22 g L hand.

## 2022-07-21 ENCOUNTER — Other Ambulatory Visit (HOSPITAL_COMMUNITY): Payer: Self-pay | Admitting: Specialist

## 2022-07-21 ENCOUNTER — Ambulatory Visit
Admission: RE | Admit: 2022-07-21 | Discharge: 2022-07-21 | Disposition: A | Discharge status: Home / Self Care | Payer: Medicare Other | Source: Ambulatory Visit | Attending: Specialist | Admitting: Specialist

## 2022-07-21 ENCOUNTER — Other Ambulatory Visit: Payer: Self-pay | Admitting: Specialist

## 2022-07-21 ENCOUNTER — Other Ambulatory Visit
Admission: RE | Admit: 2022-07-21 | Discharge: 2022-07-21 | Disposition: A | Discharge status: Home / Self Care | Payer: Medicare Other | Source: Ambulatory Visit | Attending: Specialist | Admitting: Specialist

## 2022-07-21 DIAGNOSIS — R0609 Other forms of dyspnea: Secondary | ICD-10-CM | POA: Diagnosis present

## 2022-07-21 DIAGNOSIS — R0602 Shortness of breath: Secondary | ICD-10-CM | POA: Insufficient documentation

## 2022-07-21 DIAGNOSIS — R079 Chest pain, unspecified: Secondary | ICD-10-CM | POA: Diagnosis present

## 2022-07-21 DIAGNOSIS — R071 Chest pain on breathing: Secondary | ICD-10-CM | POA: Diagnosis present

## 2022-07-21 DIAGNOSIS — R7989 Other specified abnormal findings of blood chemistry: Secondary | ICD-10-CM | POA: Insufficient documentation

## 2022-07-21 LAB — D-DIMER, QUANTITATIVE: D-Dimer, Quant: 4.5 ug/mL-FEU — ABNORMAL HIGH (ref 0.00–0.50)

## 2022-07-21 MED ORDER — IOHEXOL 350 MG/ML SOLN
75.0000 mL | Freq: Once | INTRAVENOUS | Status: AC | PRN
  Administered 2022-07-21: 75 mL via INTRAVENOUS

## 2022-07-26 ENCOUNTER — Other Ambulatory Visit: Payer: Self-pay | Admitting: Specialist

## 2022-07-26 ENCOUNTER — Ambulatory Visit
Admission: RE | Admit: 2022-07-26 | Discharge: 2022-07-26 | Disposition: A | Discharge status: Home / Self Care | Payer: Medicare Other | Source: Ambulatory Visit | Attending: Specialist | Admitting: Specialist

## 2022-07-26 DIAGNOSIS — R7989 Other specified abnormal findings of blood chemistry: Secondary | ICD-10-CM

## 2022-07-31 ENCOUNTER — Encounter: Payer: Self-pay | Admitting: Internal Medicine

## 2022-07-31 ENCOUNTER — Ambulatory Visit: Payer: Medicare Other | Admitting: Internal Medicine

## 2022-08-04 ENCOUNTER — Encounter: Payer: Self-pay | Admitting: Pulmonary Disease

## 2022-08-04 ENCOUNTER — Ambulatory Visit (INDEPENDENT_AMBULATORY_CARE_PROVIDER_SITE_OTHER): Payer: Medicare Other | Admitting: Pulmonary Disease

## 2022-08-04 DIAGNOSIS — Z87891 Personal history of nicotine dependence: Secondary | ICD-10-CM

## 2022-08-04 DIAGNOSIS — Z122 Encounter for screening for malignant neoplasm of respiratory organs: Secondary | ICD-10-CM

## 2022-08-04 NOTE — Patient Instructions (Signed)
Thank you for participating in the [facility] Screening [facility]. It was our pleasure to meet you today. We will call you with the results of your scan within the next few days. Your scan will be assigned a Lung RADS category score by the physicians reading the scans.  This Lung RADS score determines follow up scanning.  See below for description of categories, and follow up screening recommendations. We will be in touch to schedule your follow up screening annually or based on recommendations of our providers. We will fax a copy of your scan results to your Primary Care Physician, or the physician who referred you to the program, to ensure they have the results. Please call the office if you have any questions or concerns regarding your scanning experience or results.  Our office number is [phone]. Please speak with Denise Phelps, RN. , or  Denise Buckner RN, They are  our Lung Cancer Screening RN.'s If They are unavailable when you call, Please leave a message on the voice mail. We will return your call at our earliest convenience.This voice mail is monitored several times a day.  Remember, if your scan is normal, we will scan you annually as long as you continue to meet the criteria for the program. (Age 55-77, Current smoker or smoker who has quit within the last 15 years). If you are a smoker, remember, quitting is the single most powerful action that you can take to decrease your risk of lung cancer and other pulmonary, breathing related problems. We know quitting is hard, and we are here to help.  Please let us know if there is anything we can do to help you meet your goal of quitting. If you are a former smoker, congratulations. We are proud of you! Remain smoke free! Remember you can refer friends or family members through the number above.  We will screen them to make sure they meet criteria for the program. Thank you for helping us take better care of you by  participating in Lung Screening.  You can receive free nicotine replacement therapy ( patches, gum or mints) by calling 1-800-QUIT NOW. Please call so we can get you on the path to becoming  a non-smoker. I know it is hard, but you can do this!  Lung RADS Categories:  Lung RADS 1: no nodules or definitely non-concerning nodules.  Recommendation is for a repeat annual scan in 12 months.  Lung RADS 2:  nodules that are non-concerning in appearance and behavior with a very low likelihood of becoming an active cancer. Recommendation is for a repeat annual scan in 12 months.  Lung RADS 3: nodules that are probably non-concerning , includes nodules with a low likelihood of becoming an active cancer.  Recommendation is for a 6-month repeat screening scan. Often noted after an upper respiratory illness. We will be in touch to make sure you have no questions, and to schedule your 6-month scan.  Lung RADS 4 A: nodules with concerning findings, recommendation is most often for a follow up scan in 3 months or additional testing based on our provider's assessment of the scan. We will be in touch to make sure you have no questions and to schedule the recommended 3 month follow up scan.  Lung RADS 4 B:  indicates findings that are concerning. We will be in touch with you to schedule additional diagnostic testing based on our provider's  assessment of the scan.  Other options for assistance in smoking cessation (   As covered by your insurance benefits)  Hypnosis for smoking cessation  [employer] [phone]  Acupuncture for smoking cessation  [facility] [phone]   

## 2022-08-04 NOTE — Progress Notes (Signed)
Shared Decision Making Visit Lung Cancer Screening Program [phone])   Eligibility: Age 51 y.o. Pack Years Smoking History Calculation 32 (# packs/per year x # years smoked) Recent History of coughing up blood  no Unexplained weight loss? no ( >Than 15 pounds within the last 6 months ) - Pt reporting a weight loss of 12lbs  Prior History Lung / other cancer yes (Diagnosis within the last 5 years already requiring surveillance chest CT Scans). - Lymphoma - 3 years ago - was managed by PCP, ? Unsure who her specialists are who is managing in [place]  Smoking Status Current Smoker - 1 ppd   Visit Components: Discussion included one or more decision making aids. yes Discussion included risk/benefits of screening. yes Discussion included potential follow up diagnostic testing for abnormal scans. yes Discussion included meaning and risk of over diagnosis. yes Discussion included meaning and risk of False Positives. yes Discussion included meaning of total radiation exposure. yes  Counseling Included: Importance of adherence to annual lung cancer LDCT screening. yes Impact of comorbidities on ability to participate in the program. yes Ability and willingness to under diagnostic treatment. yes  Smoking Cessation Counseling: Current Smokers:  Discussed importance of smoking cessation. yes Information about tobacco cessation classes and interventions provided to patient. yes Patient provided with "ticket" for LDCT Scan. yes Asymptomatic Patient yes  Counseling (Intermediate counseling: > three minutes counseling) [id number] Information about tobacco cessation classes and interventions provided to patient. Yes Patient provided with "ticket" for LDCT Scan. no Written Order for Lung Cancer Screening with LDCT placed in Epic. Yes (CT Chest Lung Cancer Screening Low Dose W/O CM) [id number] Z12.2-Screening of respiratory organs Z87.891-Personal history of nicotine dependence   *NOTE: Pt was very  tearful over the phone during discussion and overwhelmed with medical appointments.I emphasized the importance of keeping appts, arriving on time, and scheduling follow ups with PCP and ?Specialists in [place]. Pt does have regular follow up with [facility].   Lauraine Rinne, NP

## 2022-08-08 ENCOUNTER — Ambulatory Visit
Admission: RE | Admit: 2022-08-08 | Discharge: 2022-08-08 | Disposition: A | Discharge status: Home / Self Care | Payer: Medicare Other | Source: Ambulatory Visit | Attending: Acute Care | Admitting: Acute Care

## 2022-08-08 DIAGNOSIS — F17210 Nicotine dependence, cigarettes, uncomplicated: Secondary | ICD-10-CM | POA: Diagnosis present

## 2022-08-08 DIAGNOSIS — Z87891 Personal history of nicotine dependence: Secondary | ICD-10-CM | POA: Insufficient documentation

## 2022-08-08 DIAGNOSIS — Z122 Encounter for screening for malignant neoplasm of respiratory organs: Secondary | ICD-10-CM | POA: Diagnosis present

## 2022-08-16 ENCOUNTER — Other Ambulatory Visit: Payer: Self-pay

## 2022-08-16 ENCOUNTER — Telehealth: Payer: Self-pay | Admitting: Acute Care

## 2022-08-16 DIAGNOSIS — Z87891 Personal history of nicotine dependence: Secondary | ICD-10-CM

## 2022-08-16 DIAGNOSIS — F17210 Nicotine dependence, cigarettes, uncomplicated: Secondary | ICD-10-CM

## 2022-08-16 DIAGNOSIS — Z122 Encounter for screening for malignant neoplasm of respiratory organs: Secondary | ICD-10-CM

## 2022-08-16 NOTE — Telephone Encounter (Signed)
IT is ok for you to call this patient. She has Similar borderline enlarged axillary lymph nodes, compatible with the provided history of lymphoma. The thyroid has already been evaluated with [place] and biopsy.   Please place order for 12 month follow up Low Dose CT Chest.  Please fax results to PCP Thanks so much

## 2022-08-16 NOTE — Telephone Encounter (Signed)
Results faxed to PCP and new order placed for 2024 LDCT

## 2022-09-04 ENCOUNTER — Ambulatory Visit: Payer: Medicare Other

## 2022-09-10 ENCOUNTER — Emergency Department
Admission: EM | Admit: 2022-09-10 | Discharge: 2022-09-10 | Disposition: A | Discharge status: Home / Self Care | Payer: Medicare Other | Attending: Emergency Medicine | Admitting: Emergency Medicine

## 2022-09-10 ENCOUNTER — Emergency Department: Payer: Medicare Other

## 2022-09-10 ENCOUNTER — Ambulatory Visit
Admission: EM | Admit: 2022-09-10 | Discharge: 2022-09-10 | Disposition: A | Discharge status: Home / Self Care | Payer: Medicare Other | Attending: Emergency Medicine | Admitting: Emergency Medicine

## 2022-09-10 ENCOUNTER — Other Ambulatory Visit: Payer: Self-pay

## 2022-09-10 DIAGNOSIS — R079 Chest pain, unspecified: Secondary | ICD-10-CM

## 2022-09-10 DIAGNOSIS — J439 Emphysema, unspecified: Secondary | ICD-10-CM | POA: Diagnosis not present

## 2022-09-10 DIAGNOSIS — J441 Chronic obstructive pulmonary disease with (acute) exacerbation: Secondary | ICD-10-CM

## 2022-09-10 DIAGNOSIS — E119 Type 2 diabetes mellitus without complications: Secondary | ICD-10-CM | POA: Insufficient documentation

## 2022-09-10 DIAGNOSIS — Z20822 Contact with and (suspected) exposure to covid-19: Secondary | ICD-10-CM | POA: Diagnosis not present

## 2022-09-10 DIAGNOSIS — J45909 Unspecified asthma, uncomplicated: Secondary | ICD-10-CM | POA: Diagnosis not present

## 2022-09-10 DIAGNOSIS — E039 Hypothyroidism, unspecified: Secondary | ICD-10-CM | POA: Insufficient documentation

## 2022-09-10 DIAGNOSIS — D72829 Elevated white blood cell count, unspecified: Secondary | ICD-10-CM | POA: Insufficient documentation

## 2022-09-10 DIAGNOSIS — R0602 Shortness of breath: Secondary | ICD-10-CM

## 2022-09-10 DIAGNOSIS — R9431 Abnormal electrocardiogram [ECG] [EKG]: Secondary | ICD-10-CM

## 2022-09-10 DIAGNOSIS — I110 Hypertensive heart disease with heart failure: Secondary | ICD-10-CM | POA: Diagnosis not present

## 2022-09-10 DIAGNOSIS — I5032 Chronic diastolic (congestive) heart failure: Secondary | ICD-10-CM | POA: Insufficient documentation

## 2022-09-10 DIAGNOSIS — R0789 Other chest pain: Secondary | ICD-10-CM | POA: Diagnosis present

## 2022-09-10 LAB — TROPONIN I (HIGH SENSITIVITY): Troponin I (High Sensitivity): 4 ng/L (ref <18)

## 2022-09-10 LAB — CBC WITH DIFFERENTIAL/PLATELET
Abs Immature Granulocytes: 0.05 10*3/uL (ref 0.00–0.07)
Basophils Absolute: 0.1 10*3/uL (ref 0.0–0.1)
Basophils Relative: 0 %
Eosinophils Absolute: 0.1 10*3/uL (ref 0.0–0.5)
Eosinophils Relative: 1 %
HCT: 39.9 % (ref 36.0–46.0)
Hemoglobin: 13.3 g/dL (ref 12.0–15.0)
Immature Granulocytes: 0 %
Lymphocytes Relative: 29 %
Lymphs Abs: 4.5 10*3/uL — ABNORMAL HIGH (ref 0.7–4.0)
MCH: 30.9 pg (ref 26.0–34.0)
MCHC: 33.3 g/dL (ref 30.0–36.0)
MCV: 92.8 fL (ref 80.0–100.0)
Monocytes Absolute: 1 10*3/uL (ref 0.1–1.0)
Monocytes Relative: 7 %
Neutro Abs: 9.9 10*3/uL — ABNORMAL HIGH (ref 1.7–7.7)
Neutrophils Relative %: 63 %
Platelets: 328 10*3/uL (ref 150–400)
RBC: 4.3 MIL/uL (ref 3.87–5.11)
RDW: 14.6 % (ref 11.5–15.5)
WBC: 15.7 10*3/uL — ABNORMAL HIGH (ref 4.0–10.5)
nRBC: 0 % (ref 0.0–0.2)

## 2022-09-10 LAB — COMPREHENSIVE METABOLIC PANEL
ALT: 19 U/L (ref 0–44)
AST: 32 U/L (ref 15–41)
Albumin: 3.7 g/dL (ref 3.5–5.0)
Alkaline Phosphatase: 63 U/L (ref 38–126)
Anion gap: 8 (ref 5–15)
BUN: 12 mg/dL (ref 6–20)
CO2: 24 mmol/L (ref 22–32)
Calcium: 8.9 mg/dL (ref 8.9–10.3)
Chloride: 112 mmol/L — ABNORMAL HIGH (ref 98–111)
Creatinine, Ser: 0.47 mg/dL (ref 0.44–1.00)
GFR, Estimated: >60 mL/min (ref >60)
Glucose, Bld: 117 mg/dL — ABNORMAL HIGH (ref 70–99)
Potassium: 4.2 mmol/L (ref 3.5–5.1)
Sodium: 144 mmol/L (ref 135–145)
Total Bilirubin: 1.1 mg/dL (ref 0.3–1.2)
Total Protein: 7.4 g/dL (ref 6.5–8.1)

## 2022-09-10 LAB — RESP PANEL BY RT-PCR (FLU A&B, COVID) ARPGX2
Influenza A by PCR: NEGATIVE
Influenza B by PCR: NEGATIVE
SARS Coronavirus 2 by RT PCR: NEGATIVE

## 2022-09-10 MED ORDER — PREDNISONE 50 MG PO TABS: ORAL_TABLET | ORAL | 0 refills | Status: DC

## 2022-09-10 MED ORDER — ALBUTEROL SULFATE (2.5 MG/3ML) 0.083% IN NEBU
2.5000 mg | INHALATION_SOLUTION | Freq: Once | RESPIRATORY_TRACT | Status: AC
  Administered 2022-09-10: 2.5 mg via RESPIRATORY_TRACT

## 2022-09-10 MED ORDER — IPRATROPIUM-ALBUTEROL 0.5-2.5 (3) MG/3ML IN SOLN
3.0000 mL | Freq: Once | RESPIRATORY_TRACT | Status: AC
  Administered 2022-09-10: 3 mL via RESPIRATORY_TRACT
  Filled 2022-09-10: qty 3

## 2022-09-10 MED ORDER — LEVOFLOXACIN 500 MG PO TABS: 500.0000 mg | ORAL_TABLET | Freq: Every day | ORAL | 0 refills | Status: AC

## 2022-09-10 MED ORDER — PREDNISONE 20 MG PO TABS
60.0000 mg | ORAL_TABLET | Freq: Once | ORAL | Status: AC
  Administered 2022-09-10: 60 mg via ORAL
  Filled 2022-09-10: qty 3

## 2022-09-10 NOTE — ED Provider Notes (Signed)
[facility], [facility], [facility] Provider Note    Event Date/Time   First MD Initiated Contact with Patient 09/10/22 [phone]     (approximate)   History   Chest Pain   HPI  Catherine Giles is a 51 y.o. female with past medical history of lupus fibromyalgia hypertension COPD and diabetes presents because of cough and chest pain.  Patient symptoms started last night.  She endorses cough productive of yellow sputum that is occasionally blood-tinged.  She feels short of breath.  She also endorses significant centralized chest pain described as pressure worse with coughing but not worse with breathing.  Nonradiating nonexertional.  Denies history of similar pain.  She notes that her 2 daughters were 76 and 20 have similar symptoms.  Went to urgent care this morning they transferred her here.     Past Medical History:  Diagnosis Date   Arthritis    RA   Asthma    COPD (chronic obstructive pulmonary disease) ([place])    Depression    Diabetes mellitus without complication ([place])    Emphysema of lung ([place])    Family history of breast cancer    6/21 cancer genetic testing letter sent   Fibromyalgia    Hypertension    Lupus ([place])    Pre-diabetes    Thyroid disease     Patient Active Problem List   Diagnosis Date Noted   Chest pain 09/10/2022   COPD (chronic obstructive pulmonary disease) (HCC)    Hypothyroidism    Nicotine dependence, cigarettes, uncomplicated    Chronic diastolic CHF (congestive heart failure) ([place])    Allergy to alpha-gal 01/07/2022   Depression, major, single episode, mild ([place]) 01/24/2021   Dairy allergy 10/12/2020   Vaginal stenosis 03/19/2020   Obesity 03/19/2020   Insomnia 01/05/2020   Complex regional pain syndrome I of upper limb 12/17/2019   Hypertension associated with diabetes ([place]) 12/13/2019   Type II diabetes mellitus with complication ([place]) 03/50/0938   Hashimoto's thyroiditis 12/13/2019   DDD (degenerative disc disease), lumbar 08/26/2019    Multinodular goiter 08/14/2019   Chronic left hip pain 05/05/2019   Osteoarthritis of spine with radiculopathy, cervical region 05/05/2019   Polypharmacy 02/14/2019   Fibromyalgia 02/10/2019   Rheumatoid arthritis involving multiple sites with positive rheumatoid factor ([place]) 02/10/2019   Cervical radiculopathy 03/13/2018   Vitamin D deficiency 10/11/2015     Physical Exam  Triage Vital Signs: ED Triage Vitals [09/10/22 0937]  Enc Vitals Group     BP      Pulse      Resp      Temp      Temp src      SpO2 99 %     Weight      Height      Head Circumference      Peak Flow      Pain Score      Pain Loc      Pain Edu?      Excl. in [place]?     Most recent vital signs: Vitals:   09/10/22 1100 09/10/22 1154  BP: (!) 101/53   Pulse: 97   Resp: 15   Temp:    SpO2: 93% 98%     General: Awake, patient is tearful CV:  Good peripheral perfusion.  No peripheral edema or asymmetry Resp:  Normal effort.  No respiratory distress but diffuse expiratory wheezing with good air movement Abd:  No distention.  Neuro:  Awake, Alert, Oriented x 3  Other:     ED Results / Procedures / Treatments  Labs (all labs ordered are listed, but only abnormal results are displayed) Labs Reviewed  COMPREHENSIVE METABOLIC PANEL - Abnormal; Notable for the following components:      Result Value   Chloride 112 (*)    Glucose, Bld 117 (*)    All other components within normal limits  CBC WITH DIFFERENTIAL/PLATELET - Abnormal; Notable for the following components:   WBC 15.7 (*)    Neutro Abs 9.9 (*)    Lymphs Abs 4.5 (*)    All other components within normal limits  RESP PANEL BY RT-PCR (FLU A&B, COVID) ARPGX2  TROPONIN I (HIGH SENSITIVITY)  TROPONIN I (HIGH SENSITIVITY)     EKG  EKG reviewed interpreted myself shows normal sinus rhythm normal axis normal intervals T wave inversions in inferior leads, inverted T wave in V3 similar to prior EKG   RADIOLOGY I reviewed and  interpreted the CXR which does not show any acute cardiopulmonary process    PROCEDURES:  Critical Care performed: No  .1-3 Lead EKG Interpretation  Performed by: Rada Hay, MD Authorized by: Rada Hay, MD     Interpretation: normal     ECG rate assessment: normal     Rhythm: sinus rhythm     Ectopy: none     Conduction: normal     The patient is on the cardiac monitor to evaluate for evidence of arrhythmia and/or significant heart rate changes.   MEDICATIONS ORDERED IN ED: Medications  ipratropium-albuterol (DUONEB) 0.5-2.5 (3) MG/3ML nebulizer solution 3 mL (3 mLs Nebulization Given 09/10/22 1014)  predniSONE (DELTASONE) tablet 60 mg (60 mg Oral Given 09/10/22 1015)     IMPRESSION / MDM / ASSESSMENT AND PLAN / ED COURSE  I reviewed the triage vital signs and the nursing notes.                              Patient's presentation is most consistent with acute presentation with potential threat to life or bodily function.  Differential diagnosis includes, but is not limited to, viral illness including COVID-19, COPD exacerbation, pneumonia, PE, ACS    The patient is a 51 year old female who presents with cough chest pain shortness of breath.  Symptoms all started last night.  Both of her daughters are ill as well with similar symptoms.  Presented to urgent care initially and was transferred to the ED.  Patient's vitals are reassuring other than mild hypertension.  She is tearful and uncomfortable.  She describes chest pressure that is constant but worse with coughing.  Does have blood-tinged sputum to the cough.  She has had chills no nausea vomiting or diarrhea.  She does have wheezing on exam but is not hypoxic and is moving good air.  We will treat with DuoNebs and steroids.  Will obtain chest x-ray labs.  Patient's EKG does have some T wave abnormalities but they appear similar to her prior EKG.  We will check troponin but clinically my suspicion for ACS  is low .  Chest x-ray is clear.  Patient's labs are reassuring troponin is negative and symptoms started greater than 3 hours ago with pain being atypical and gets very low likelihood that this is ACS.  To [facility] with leukocytosis but patient is on chronic prednisone.  After 60 mg of prednisone and DuoNeb patient smiling laughing says she feels much  better.  She is no longer wheezing satting 98% on room air heart rate has improved.  Consider PE given the blood-tinged sputum but I think in the setting of the wheezing and viral type symptoms I think is more likely to be a COPD exacerbation.  Will discharge with 5 days of prednisone and antibiotics for COPD exacerbation.   FINAL CLINICAL IMPRESSION(S) / ED DIAGNOSES   Final diagnoses:  COPD exacerbation ([place])     Rx / DC Orders   ED Discharge Orders          Ordered    predniSONE (DELTASONE) 50 MG tablet        09/10/22 1202    levofloxacin (LEVAQUIN) 500 MG tablet  Daily        09/10/22 1202             Note:  This document was prepared using Dragon voice recognition software and may include unintentional dictation errors.   Rada Hay, MD 09/10/22 [phone]

## 2022-09-10 NOTE — Discharge Instructions (Addendum)
Your COVID and influenza test were negative.  I suspect you are having exacerbation of COPD likely related to a viral infection.  Please take the prednisone 2 mg once daily instead of your daily prednisone.  After 5 days you can go back to your daily prednisone dose.  Please also take the antibiotic for the next 5 days to treat for potential bacterial infection.

## 2022-09-10 NOTE — ED Triage Notes (Addendum)
Patient to Urgent Care with complaints of flu-like symptoms. Reports waking up this morning coughing up mucus and blood, extreme body aches. Endorses centralized, chest pressure described as feeling heaviness and shortness of breath.  Reports that her daughter has been sick.   Hx asthma/ COPD. Reports using an albuterol neb tx at 5am this morning.

## 2022-09-10 NOTE — ED Triage Notes (Signed)
Pt from Urgent care, C/O cold/flu symptoms starting last night. States feels like a "brick sitting on my chest". States coughing up blood tinged sputum. States children are sick at home.

## 2022-09-10 NOTE — ED Provider Notes (Signed)
Roderic Palau    CSN: [id number] Arrival date & time: 09/10/22  1572      History   Chief Complaint Chief Complaint  Patient presents with   Cough    covid symptoms - Entered by patient    HPI Catherine Giles is a 51 y.o. female.  Patient presents with chest pressure, shortness of breath, wheezing since early morning.  She describes the chest pressure as a heavy feeling on her chest.  She states she is coughing up mucus and blood.  She also reports body aches.  No fever, vomiting, diarrhea, or other symptoms.  She used her albuterol nebulizer at 0500 this morning.  Her medical history includes COPD, heart failure, hypertension, diabetes.  The history is provided by the patient and medical records.    Past Medical History:  Diagnosis Date   Arthritis    RA   Asthma    COPD (chronic obstructive pulmonary disease) ([place])    Depression    Diabetes mellitus without complication ([place])    Emphysema of lung ([place])    Family history of breast cancer    6/21 cancer genetic testing letter sent   Fibromyalgia    Hypertension    Lupus ([place])    Pre-diabetes    Thyroid disease     Patient Active Problem List   Diagnosis Date Noted   Chest pain 09/10/2022   COPD (chronic obstructive pulmonary disease) (HCC)    Hypothyroidism    Nicotine dependence, cigarettes, uncomplicated    Chronic diastolic CHF (congestive heart failure) ([place])    Allergy to alpha-gal 01/07/2022   Depression, major, single episode, mild ([place]) 01/24/2021   Dairy allergy 10/12/2020   Vaginal stenosis 03/19/2020   Obesity 03/19/2020   Insomnia 01/05/2020   Complex regional pain syndrome I of upper limb 12/17/2019   Hypertension associated with diabetes ([place]) 12/13/2019   Type II diabetes mellitus with complication ([place]) 62/12/5595   Hashimoto's thyroiditis 12/13/2019   DDD (degenerative disc disease), lumbar 08/26/2019   Multinodular goiter 08/14/2019   Chronic left hip pain 05/05/2019    Osteoarthritis of spine with radiculopathy, cervical region 05/05/2019   Polypharmacy 02/14/2019   Fibromyalgia 02/10/2019   Rheumatoid arthritis involving multiple sites with positive rheumatoid factor ([place]) 02/10/2019   Cervical radiculopathy 03/13/2018   Vitamin D deficiency 10/11/2015    Past Surgical History:  Procedure Laterality Date   CHOLECYSTECTOMY, LAPAROSCOPIC  02/2022   MOUTH SURGERY     TRANSTHORACIC ECHOCARDIOGRAM  04/2020   EF 60 to 65%.  Normal LV function.  No R WMA.  GR 1 DD.  Normal RV size and function.  Normal PAP.  Normal atrial size.  Normal valves.    OB History     Gravida  9   Para  6   Term  0   Preterm  0   AB  3   Living         SAB  2   IAB  0   Ectopic  0   Multiple      Live Births               Home Medications    Prior to Admission medications   Medication Sig Start Date End Date Taking? Authorizing Provider  Acidophilus Lactobacillus CAPS Take 1 tablet by mouth daily.    [provider]  albuterol (PROVENTIL) (2.5 MG/3ML) 0.083% nebulizer solution Take 3 mLs (2.5 mg total) by nebulization every 6 (six) hours as needed for  wheezing or shortness of breath. 07/25/21   Coral Spikes, DO  albuterol (VENTOLIN HFA) 108 (90 Base) MCG/ACT inhaler Inhale 2 puffs into the lungs every 4 (four) hours as needed.  02/12/19 11/22/21  [provider]  amLODipine (NORVASC) 10 MG tablet Take 1 tablet (10 mg total) by mouth daily. 09/05/21   Marnee Guarneri T, NP  aspirin 81 MG EC tablet Take 1 tablet (81 mg total) by mouth daily. Swallow whole. 11/09/21   Vigg, Avanti, MD  Blood Glucose Monitoring Suppl (ONETOUCH VERIO) w/Device KIT Use to check blood sugar 3 times daily with goal <130 fasting and <180 two hours after meal.  Bring meter to visits. 01/25/21   Cannady, Henrine Screws T, NP  busPIRone (BUSPAR) 5 MG tablet Take 1 tablet (5 mg total) by mouth 2 (two) times daily. 09/05/21   Cannady, Henrine Screws T, NP  cetirizine (ZYRTEC) 10 MG  tablet Take 1 tablet (10 mg total) by mouth daily. 01/03/22   Kathrine Haddock, NP  cloNIDine (CATAPRES) 0.1 MG tablet Take 1 tablet (0.1 mg total) by mouth 2 (two) times daily. 11/22/21   Minna Merritts, MD  dapagliflozin propanediol (FARXIGA) 10 MG TABS tablet Take 1 tablet (10 mg total) by mouth daily before breakfast. 03/16/21   Cannady, Henrine Screws T, NP  DULoxetine (CYMBALTA) 30 MG capsule Take 30 mg by mouth 2 (two) times daily. 10/25/21   [provider]  EPINEPHrine 0.3 mg/0.3 mL IJ SOAJ injection Inject 0.3 mg into the muscle as needed for anaphylaxis. 02/15/22   Cannady, Henrine Screws T, NP  famotidine (PEPCID) 20 MG tablet Take 20 mg by mouth 2 (two) times daily. 02/15/22   [provider]  Fluticasone-Umeclidin-Vilant (TRELEGY ELLIPTA) 100-62.5-25 MCG/INH AEPB Inhale 1 puff into the lungs daily. 11/29/20   [provider]  glucose blood test strip Use to check blood sugar 3 times daily with goal <130 fasting and <180 two hours after meal. 01/25/21   Cannady, Jolene T, NP  golimumab (SIMPONI ARIA) 50 MG/4ML SOLN injection Inject into the vein.    [provider]  hydrochlorothiazide (HYDRODIURIL) 25 MG tablet Take 1 tablet (25 mg total) by mouth daily. 11/22/21   Minna Merritts, MD  hydrOXYzine (VISTARIL) 25 MG capsule TAKE 1 CAPSULE BY MOUTH EVERY 8 HOURS ASNEEDED 01/03/22   Kathrine Haddock, NP  ibuprofen (ADVIL) 600 MG tablet Take 600 mg by mouth 3 (three) times daily. 02/15/22   [provider]  ipratropium-albuterol (DUONEB) 0.5-2.5 (3) MG/3ML SOLN Take 3 mLs by nebulization every 6 (six) hours as needed for up to 7 days. 10/30/21 11/22/21  Laurene Footman B, PA-C  Lancets [facility] ULTRASOFT) lancets Use to check blood sugar 3 times daily with goal <130 fasting and <180 two hours after meal. 01/25/21   Cannady, Jolene T, NP  leflunomide (ARAVA) 20 MG tablet Take 20 mg by mouth daily. 01/22/21   [provider]  levothyroxine (SYNTHROID) 50 MCG tablet Take 50 mcg  by mouth daily before breakfast.     [provider]  metroNIDAZOLE (FLAGYL) 500 MG tablet Take 1 tablet (500 mg total) by mouth 2 (two) times daily. 03/15/22   Wouk, Ailene Rud, MD  montelukast (SINGULAIR) 10 MG tablet Take 10 mg by mouth daily. 02/15/22   [provider]  oxyCODONE (OXY IR/ROXICODONE) 5 MG immediate release tablet Take 1-2 tablets (5-10 mg total) by mouth every 6 (six) hours as needed for moderate pain or severe pain. 03/15/22   Wouk, Ailene Rud, MD  polyethylene glycol (MIRALAX) 17 g packet Take 17 g by mouth daily. 03/15/22   Wouk, Ailene Rud, MD  promethazine-dextromethorphan (PROMETHAZINE-DM) 6.25-15 MG/5ML syrup Take 5 mLs by mouth 4 (four) times daily as needed. 06/23/22   Laurene Footman B, PA-C  sitaGLIPtin (JANUVIA) 100 MG tablet Take 1 tablet (100 mg total) by mouth daily. 12/02/21   Cannady, Henrine Screws T, NP  traZODone (DESYREL) 50 MG tablet Take 1 tablet (50 mg total) by mouth at bedtime. 01/05/20 07/01/20  Venita Lick, NP    Family History Family History  Problem Relation Age of Onset   Lung cancer Mother    Diabetes Mother    Breast cancer Mother 92   Hypertension Father    Cancer Maternal Aunt    Ovarian cancer Maternal Aunt    Diabetes Maternal Aunt    Diabetes Maternal Uncle    Hypertension Maternal Grandmother    Diabetes Maternal Grandmother     Social History Social History   Tobacco Use   Smoking status: Every Day    Packs/day: 1.00    Years: 34.00    Total pack years: 34.00    Types: Cigarettes   Smokeless tobacco: Never   Tobacco comments:    2-3 cig/day  Vaping Use   Vaping Use: Never used  Substance Use Topics   Alcohol use: Yes    Comment: Occ   Drug use: Not Currently     Allergies   Azithromycin, Molds & smuts, Alpha-gal, Penicillins, and Shellfish allergy   Review of Systems Review of Systems  Constitutional:  Negative for chills and fever.  HENT:  Negative for ear pain and sore throat.   Respiratory:   Positive for cough, shortness of breath and wheezing.   Cardiovascular:  Positive for chest pain. Negative for palpitations.  Gastrointestinal:  Negative for abdominal pain, diarrhea and vomiting.  Skin:  Negative for rash.  Neurological:  Negative for weakness and numbness.  All other systems reviewed and are negative.    Physical Exam Triage Vital Signs ED Triage Vitals [09/10/22 0838]  Enc Vitals Group     BP      Pulse Rate 100     Resp 18     Temp 98.6 F (37 C)     Temp src      SpO2 91 %     Weight      Height      Head Circumference      Peak Flow      Pain Score      Pain Loc      Pain Edu?      Excl. in [place]?    No data found.  Updated Vital Signs BP 136/84   Pulse 100   Temp 98.6 F (37 C)   Resp 18   Ht _0  (1.6 m)   Wt 167 lb (75.8 kg)   SpO2 92%   BMI 29.58 kg/m   Visual Acuity Right Eye Distance:   Left Eye Distance:   Bilateral Distance:    Right Eye Near:   Left Eye Near:    Bilateral Near:     Physical Exam Vitals and nursing note reviewed.  Constitutional:      General: She is not in acute distress.    Appearance: She is well-developed. She is ill-appearing.  HENT:     Mouth/Throat:     Mouth: Mucous membranes are moist.  Cardiovascular:     Rate and Rhythm: Normal rate and regular rhythm.  Heart sounds: Normal heart sounds.  Pulmonary:     Effort: Pulmonary effort is normal. No respiratory distress.     Breath sounds: Wheezing present.     Comments: Decreased air movement and wheezing throughout.   Abdominal:     Palpations: Abdomen is soft.     Tenderness: There is no abdominal tenderness.  Musculoskeletal:     Cervical back: Neck supple.  Skin:    General: Skin is warm and dry.  Neurological:     General: No focal deficit present.     Mental Status: She is alert and oriented to person, place, and time.     Sensory: No sensory deficit.     Motor: No weakness.     Gait: Gait normal.  Psychiatric:        Mood and  Affect: Mood normal.        Behavior: Behavior normal.      UC Treatments / Results  Labs (all labs ordered are listed, but only abnormal results are displayed) Labs Reviewed - No data to display  EKG   Radiology No results found.  Procedures Procedures (including critical care time)  Medications Ordered in UC Medications  albuterol (PROVENTIL) (2.5 MG/3ML) 0.083% nebulizer solution 2.5 mg (2.5 mg Nebulization Given 09/10/22 0856)    Initial Impression / Assessment and Plan / UC Course  I have reviewed the triage vital signs and the nursing notes.  Pertinent labs & imaging results that were available during my care of the patient were reviewed by me and considered in my medical decision making (see chart for details).   Chest pain, abnormal EKG, shortness of breath, COPD exacerbation.  O2 sat 91% on room air upon arrival.  EKG sinus rhythm, rate 94, no ST elevation, inverted T waves in several leads which are new compared to previous from 05/29/2022.  Albuterol nebulizer treatment given here; O2 sat 95% while using nebulizer then decreased to 92% on room air.  Sending patient to the ED via EMS.  Final Clinical Impressions(s) / UC Diagnoses   Final diagnoses:  Chest pain, unspecified type  Abnormal EKG  Shortness of breath  COPD exacerbation [facility])   Discharge Instructions   None    ED Prescriptions   None    PDMP not reviewed this encounter.   Sharion Balloon, NP 09/10/22 [phone]

## 2022-09-10 NOTE — ED Notes (Signed)
Patient is being discharged from the Urgent Care and sent to the Emergency Department via [facility] . Per Barkley Boards, patient is in need of higher level of care due to shortness of breath/ chest pain. Patient is aware and verbalizes understanding of plan of care.  Vitals:   09/10/22 0838 09/10/22 0841  BP:  136/84  Pulse: 100   Resp: 18   Temp: 98.6 F (37 C)   SpO2: 91%

## 2022-10-02 ENCOUNTER — Ambulatory Visit: Payer: Medicare Other

## 2022-10-04 ENCOUNTER — Ambulatory Visit: Payer: Medicare Other

## 2022-10-04 DIAGNOSIS — R0683 Snoring: Secondary | ICD-10-CM | POA: Insufficient documentation

## 2022-10-04 DIAGNOSIS — G4733 Obstructive sleep apnea (adult) (pediatric): Secondary | ICD-10-CM | POA: Insufficient documentation

## 2022-10-04 DIAGNOSIS — A4189 Other specified sepsis: Secondary | ICD-10-CM | POA: Diagnosis not present

## 2022-10-04 DIAGNOSIS — R5383 Other fatigue: Secondary | ICD-10-CM | POA: Insufficient documentation

## 2022-10-04 DIAGNOSIS — J180 Bronchopneumonia, unspecified organism: Secondary | ICD-10-CM | POA: Diagnosis not present

## 2022-10-04 DIAGNOSIS — G4700 Insomnia, unspecified: Secondary | ICD-10-CM | POA: Insufficient documentation

## 2022-10-07 ENCOUNTER — Encounter: Payer: Self-pay | Admitting: Internal Medicine

## 2022-10-07 ENCOUNTER — Inpatient Hospital Stay
Admission: EM | Admit: 2022-10-07 | Discharge: 2022-10-10 | DRG: 871 | Disposition: A | Discharge status: Home / Self Care | Payer: Medicare Other | Attending: Internal Medicine | Admitting: Internal Medicine

## 2022-10-07 ENCOUNTER — Other Ambulatory Visit: Payer: Self-pay

## 2022-10-07 ENCOUNTER — Emergency Department: Payer: Medicare Other

## 2022-10-07 DIAGNOSIS — I110 Hypertensive heart disease with heart failure: Secondary | ICD-10-CM | POA: Diagnosis present

## 2022-10-07 DIAGNOSIS — M797 Fibromyalgia: Secondary | ICD-10-CM | POA: Diagnosis present

## 2022-10-07 DIAGNOSIS — Z20822 Contact with and (suspected) exposure to covid-19: Secondary | ICD-10-CM | POA: Diagnosis present

## 2022-10-07 DIAGNOSIS — Z79890 Hormone replacement therapy (postmenopausal): Secondary | ICD-10-CM

## 2022-10-07 DIAGNOSIS — Z880 Allergy status to penicillin: Secondary | ICD-10-CM | POA: Diagnosis not present

## 2022-10-07 DIAGNOSIS — E118 Type 2 diabetes mellitus with unspecified complications: Secondary | ICD-10-CM | POA: Diagnosis not present

## 2022-10-07 DIAGNOSIS — J439 Emphysema, unspecified: Secondary | ICD-10-CM | POA: Diagnosis present

## 2022-10-07 DIAGNOSIS — Z803 Family history of malignant neoplasm of breast: Secondary | ICD-10-CM | POA: Diagnosis not present

## 2022-10-07 DIAGNOSIS — Z6829 Body mass index (BMI) 29.0-29.9, adult: Secondary | ICD-10-CM

## 2022-10-07 DIAGNOSIS — J441 Chronic obstructive pulmonary disease with (acute) exacerbation: Secondary | ICD-10-CM | POA: Diagnosis not present

## 2022-10-07 DIAGNOSIS — Z79899 Other long term (current) drug therapy: Secondary | ICD-10-CM

## 2022-10-07 DIAGNOSIS — Z833 Family history of diabetes mellitus: Secondary | ICD-10-CM

## 2022-10-07 DIAGNOSIS — E1165 Type 2 diabetes mellitus with hyperglycemia: Secondary | ICD-10-CM | POA: Diagnosis present

## 2022-10-07 DIAGNOSIS — J209 Acute bronchitis, unspecified: Secondary | ICD-10-CM | POA: Diagnosis present

## 2022-10-07 DIAGNOSIS — I10 Essential (primary) hypertension: Secondary | ICD-10-CM | POA: Diagnosis present

## 2022-10-07 DIAGNOSIS — A4189 Other specified sepsis: Secondary | ICD-10-CM | POA: Diagnosis present

## 2022-10-07 DIAGNOSIS — J111 Influenza due to unidentified influenza virus with other respiratory manifestations: Secondary | ICD-10-CM

## 2022-10-07 DIAGNOSIS — F17210 Nicotine dependence, cigarettes, uncomplicated: Secondary | ICD-10-CM | POA: Diagnosis present

## 2022-10-07 DIAGNOSIS — E039 Hypothyroidism, unspecified: Secondary | ICD-10-CM | POA: Diagnosis present

## 2022-10-07 DIAGNOSIS — M0579 Rheumatoid arthritis with rheumatoid factor of multiple sites without organ or systems involvement: Secondary | ICD-10-CM | POA: Diagnosis present

## 2022-10-07 DIAGNOSIS — J101 Influenza due to other identified influenza virus with other respiratory manifestations: Secondary | ICD-10-CM | POA: Diagnosis not present

## 2022-10-07 DIAGNOSIS — A419 Sepsis, unspecified organism: Secondary | ICD-10-CM | POA: Diagnosis present

## 2022-10-07 DIAGNOSIS — E876 Hypokalemia: Secondary | ICD-10-CM | POA: Diagnosis present

## 2022-10-07 DIAGNOSIS — Z881 Allergy status to other antibiotic agents status: Secondary | ICD-10-CM | POA: Diagnosis not present

## 2022-10-07 DIAGNOSIS — F418 Other specified anxiety disorders: Secondary | ICD-10-CM | POA: Diagnosis present

## 2022-10-07 DIAGNOSIS — I5032 Chronic diastolic (congestive) heart failure: Secondary | ICD-10-CM | POA: Diagnosis present

## 2022-10-07 DIAGNOSIS — J1289 Other viral pneumonia: Secondary | ICD-10-CM | POA: Diagnosis present

## 2022-10-07 DIAGNOSIS — Z888 Allergy status to other drugs, medicaments and biological substances status: Secondary | ICD-10-CM | POA: Diagnosis not present

## 2022-10-07 DIAGNOSIS — J180 Bronchopneumonia, unspecified organism: Secondary | ICD-10-CM | POA: Diagnosis present

## 2022-10-07 DIAGNOSIS — J1008 Influenza due to other identified influenza virus with other specified pneumonia: Secondary | ICD-10-CM | POA: Diagnosis present

## 2022-10-07 DIAGNOSIS — Z720 Tobacco use: Secondary | ICD-10-CM | POA: Diagnosis not present

## 2022-10-07 DIAGNOSIS — Z91013 Allergy to seafood: Secondary | ICD-10-CM

## 2022-10-07 DIAGNOSIS — Z8041 Family history of malignant neoplasm of ovary: Secondary | ICD-10-CM

## 2022-10-07 DIAGNOSIS — Z7951 Long term (current) use of inhaled steroids: Secondary | ICD-10-CM

## 2022-10-07 DIAGNOSIS — E669 Obesity, unspecified: Secondary | ICD-10-CM | POA: Diagnosis present

## 2022-10-07 DIAGNOSIS — J9601 Acute respiratory failure with hypoxia: Secondary | ICD-10-CM | POA: Diagnosis present

## 2022-10-07 DIAGNOSIS — Z7982 Long term (current) use of aspirin: Secondary | ICD-10-CM

## 2022-10-07 DIAGNOSIS — Z8249 Family history of ischemic heart disease and other diseases of the circulatory system: Secondary | ICD-10-CM

## 2022-10-07 DIAGNOSIS — Z801 Family history of malignant neoplasm of trachea, bronchus and lung: Secondary | ICD-10-CM

## 2022-10-07 LAB — RESP PANEL BY RT-PCR (RSV, FLU A&B, COVID)  RVPGX2
Influenza A by PCR: POSITIVE — AB
Influenza B by PCR: NEGATIVE
Resp Syncytial Virus by PCR: NEGATIVE
SARS Coronavirus 2 by RT PCR: NEGATIVE

## 2022-10-07 LAB — CBC WITH DIFFERENTIAL/PLATELET
Abs Immature Granulocytes: 0.01 10*3/uL (ref 0.00–0.07)
Basophils Absolute: 0 10*3/uL (ref 0.0–0.1)
Basophils Relative: 0 %
Eosinophils Absolute: 0 10*3/uL (ref 0.0–0.5)
Eosinophils Relative: 0 %
HCT: 43.2 % (ref 36.0–46.0)
Hemoglobin: 13.9 g/dL (ref 12.0–15.0)
Immature Granulocytes: 0 %
Lymphocytes Relative: 33 %
Lymphs Abs: 1.6 10*3/uL (ref 0.7–4.0)
MCH: 30.6 pg (ref 26.0–34.0)
MCHC: 32.2 g/dL (ref 30.0–36.0)
MCV: 95.2 fL (ref 80.0–100.0)
Monocytes Absolute: 0.7 10*3/uL (ref 0.1–1.0)
Monocytes Relative: 14 %
Neutro Abs: 2.6 10*3/uL (ref 1.7–7.7)
Neutrophils Relative %: 53 %
Platelets: 260 10*3/uL (ref 150–400)
RBC: 4.54 MIL/uL (ref 3.87–5.11)
RDW: 14.9 % (ref 11.5–15.5)
WBC: 5 10*3/uL (ref 4.0–10.5)
nRBC: 0 % (ref 0.0–0.2)

## 2022-10-07 LAB — COMPREHENSIVE METABOLIC PANEL
ALT: 28 U/L (ref 0–44)
AST: 41 U/L (ref 15–41)
Albumin: 3.8 g/dL (ref 3.5–5.0)
Alkaline Phosphatase: 59 U/L (ref 38–126)
Anion gap: 10 (ref 5–15)
BUN: 9 mg/dL (ref 6–20)
CO2: 26 mmol/L (ref 22–32)
Calcium: 8.7 mg/dL — ABNORMAL LOW (ref 8.9–10.3)
Chloride: 100 mmol/L (ref 98–111)
Creatinine, Ser: 0.49 mg/dL (ref 0.44–1.00)
GFR, Estimated: >60 mL/min (ref >60)
Glucose, Bld: 191 mg/dL — ABNORMAL HIGH (ref 70–99)
Potassium: 2.8 mmol/L — ABNORMAL LOW (ref 3.5–5.1)
Sodium: 136 mmol/L (ref 135–145)
Total Bilirubin: 0.6 mg/dL (ref 0.3–1.2)
Total Protein: 7.7 g/dL (ref 6.5–8.1)

## 2022-10-07 LAB — PROCALCITONIN: Procalcitonin: <0.1 ng/mL

## 2022-10-07 LAB — LACTIC ACID, PLASMA: Lactic Acid, Venous: 1.5 mmol/L (ref 0.5–1.9)

## 2022-10-07 LAB — GLUCOSE, CAPILLARY
Glucose-Capillary: 227 mg/dL — ABNORMAL HIGH (ref 70–99)
Glucose-Capillary: 346 mg/dL — ABNORMAL HIGH (ref 70–99)

## 2022-10-07 LAB — TROPONIN I (HIGH SENSITIVITY)
Troponin I (High Sensitivity): 7 ng/L (ref <18)
Troponin I (High Sensitivity): 7 ng/L (ref <18)

## 2022-10-07 LAB — BRAIN NATRIURETIC PEPTIDE: B Natriuretic Peptide: 17.6 pg/mL (ref 0.0–100.0)

## 2022-10-07 LAB — MAGNESIUM: Magnesium: 2.3 mg/dL (ref 1.7–2.4)

## 2022-10-07 LAB — CBG MONITORING, ED: Glucose-Capillary: 139 mg/dL — ABNORMAL HIGH (ref 70–99)

## 2022-10-07 MED ORDER — AMLODIPINE BESYLATE 10 MG PO TABS
10.0000 mg | ORAL_TABLET | Freq: Every day | ORAL | Status: DC
  Administered 2022-10-07 – 2022-10-10 (×4): 10 mg via ORAL
  Filled 2022-10-07 (×2): qty 1
  Filled 2022-10-07: qty 2
  Filled 2022-10-07: qty 1

## 2022-10-07 MED ORDER — HYDROXYZINE PAMOATE 25 MG PO CAPS: 25.0000 mg | ORAL_CAPSULE | Freq: Three times a day (TID) | ORAL | Status: DC | PRN

## 2022-10-07 MED ORDER — LOSARTAN POTASSIUM 50 MG PO TABS
100.0000 mg | ORAL_TABLET | Freq: Every day | ORAL | Status: DC
  Administered 2022-10-08 – 2022-10-10 (×3): 100 mg via ORAL
  Filled 2022-10-07 (×3): qty 2

## 2022-10-07 MED ORDER — DULOXETINE HCL 30 MG PO CPEP
30.0000 mg | ORAL_CAPSULE | Freq: Two times a day (BID) | ORAL | Status: DC
  Administered 2022-10-07 – 2022-10-10 (×6): 30 mg via ORAL
  Filled 2022-10-07 (×6): qty 1

## 2022-10-07 MED ORDER — INSULIN ASPART 100 UNIT/ML IJ SOLN
0.0000 [IU] | Freq: Every day | INTRAMUSCULAR | Status: DC
  Administered 2022-10-07: 4 [IU] via SUBCUTANEOUS
  Administered 2022-10-07: 3 [IU] via SUBCUTANEOUS
  Filled 2022-10-07: qty 1

## 2022-10-07 MED ORDER — MORPHINE SULFATE (PF) 4 MG/ML IV SOLN
4.0000 mg | Freq: Once | INTRAVENOUS | Status: AC
  Administered 2022-10-07: 4 mg via INTRAVENOUS
  Filled 2022-10-07: qty 1

## 2022-10-07 MED ORDER — LEVOTHYROXINE SODIUM 50 MCG PO TABS
50.0000 ug | ORAL_TABLET | Freq: Every day | ORAL | Status: DC
  Administered 2022-10-08 – 2022-10-10 (×3): 50 ug via ORAL
  Filled 2022-10-07 (×3): qty 1

## 2022-10-07 MED ORDER — DM-GUAIFENESIN ER 30-600 MG PO TB12
1.0000 | ORAL_TABLET | Freq: Two times a day (BID) | ORAL | Status: DC | PRN
  Administered 2022-10-08 (×2): 1 via ORAL
  Filled 2022-10-07 (×2): qty 1

## 2022-10-07 MED ORDER — IPRATROPIUM-ALBUTEROL 0.5-2.5 (3) MG/3ML IN SOLN
3.0000 mL | RESPIRATORY_TRACT | Status: DC
  Administered 2022-10-07 – 2022-10-08 (×10): 3 mL via RESPIRATORY_TRACT
  Filled 2022-10-07 (×10): qty 3

## 2022-10-07 MED ORDER — IOHEXOL 350 MG/ML SOLN
75.0000 mL | Freq: Once | INTRAVENOUS | Status: AC | PRN
  Administered 2022-10-07: 75 mL via INTRAVENOUS

## 2022-10-07 MED ORDER — POTASSIUM CHLORIDE 20 MEQ PO PACK
40.0000 meq | PACK | Freq: Every day | ORAL | Status: DC
  Administered 2022-10-07 – 2022-10-10 (×4): 40 meq via ORAL
  Filled 2022-10-07 (×4): qty 2

## 2022-10-07 MED ORDER — IPRATROPIUM-ALBUTEROL 0.5-2.5 (3) MG/3ML IN SOLN
3.0000 mL | Freq: Once | RESPIRATORY_TRACT | Status: AC
  Administered 2022-10-07: 3 mL via RESPIRATORY_TRACT
  Filled 2022-10-07: qty 3

## 2022-10-07 MED ORDER — OSELTAMIVIR PHOSPHATE 75 MG PO CAPS
75.0000 mg | ORAL_CAPSULE | Freq: Two times a day (BID) | ORAL | Status: DC
  Administered 2022-10-07 – 2022-10-09 (×6): 75 mg via ORAL
  Filled 2022-10-07 (×8): qty 1

## 2022-10-07 MED ORDER — ONDANSETRON HCL 4 MG/2ML IJ SOLN
4.0000 mg | Freq: Three times a day (TID) | INTRAMUSCULAR | Status: DC | PRN
  Administered 2022-10-09: 4 mg via INTRAVENOUS
  Filled 2022-10-07: qty 2

## 2022-10-07 MED ORDER — FAMOTIDINE 20 MG PO TABS
20.0000 mg | ORAL_TABLET | Freq: Two times a day (BID) | ORAL | Status: DC
  Administered 2022-10-07 – 2022-10-10 (×6): 20 mg via ORAL
  Filled 2022-10-07 (×6): qty 1

## 2022-10-07 MED ORDER — SODIUM CHLORIDE 0.9 % IV BOLUS
1000.0000 mL | Freq: Once | INTRAVENOUS | Status: AC
  Administered 2022-10-07: 1000 mL via INTRAVENOUS

## 2022-10-07 MED ORDER — POTASSIUM CHLORIDE CRYS ER 20 MEQ PO TBCR
40.0000 meq | EXTENDED_RELEASE_TABLET | ORAL | Status: AC
  Administered 2022-10-07 (×2): 40 meq via ORAL
  Filled 2022-10-07 (×2): qty 2

## 2022-10-07 MED ORDER — LEFLUNOMIDE 20 MG PO TABS
20.0000 mg | ORAL_TABLET | Freq: Every day | ORAL | Status: DC
  Administered 2022-10-07 – 2022-10-10 (×4): 20 mg via ORAL
  Filled 2022-10-07 (×4): qty 1

## 2022-10-07 MED ORDER — METHYLPREDNISOLONE SODIUM SUCC 125 MG IJ SOLR
125.0000 mg | Freq: Once | INTRAMUSCULAR | Status: AC
  Administered 2022-10-07: 125 mg via INTRAVENOUS
  Filled 2022-10-07: qty 2

## 2022-10-07 MED ORDER — NICOTINE 21 MG/24HR TD PT24
21.0000 mg | MEDICATED_PATCH | Freq: Every day | TRANSDERMAL | Status: DC
  Administered 2022-10-07 – 2022-10-10 (×4): 21 mg via TRANSDERMAL
  Filled 2022-10-07 (×4): qty 1

## 2022-10-07 MED ORDER — LORATADINE 10 MG PO TABS: 10.0000 mg | ORAL_TABLET | Freq: Every day | ORAL | Status: DC | PRN

## 2022-10-07 MED ORDER — INSULIN ASPART 100 UNIT/ML IJ SOLN
0.0000 [IU] | Freq: Three times a day (TID) | INTRAMUSCULAR | Status: DC
  Administered 2022-10-07: 3 [IU] via SUBCUTANEOUS
  Administered 2022-10-08: 2 [IU] via SUBCUTANEOUS
  Administered 2022-10-08: 3 [IU] via SUBCUTANEOUS
  Administered 2022-10-08 – 2022-10-09 (×2): 2 [IU] via SUBCUTANEOUS
  Filled 2022-10-07 (×5): qty 1

## 2022-10-07 MED ORDER — ALBUTEROL SULFATE (2.5 MG/3ML) 0.083% IN NEBU
2.5000 mg | INHALATION_SOLUTION | RESPIRATORY_TRACT | Status: DC | PRN
  Administered 2022-10-10: 2.5 mg via RESPIRATORY_TRACT
  Filled 2022-10-07: qty 3

## 2022-10-07 MED ORDER — ONDANSETRON HCL 4 MG/2ML IJ SOLN
4.0000 mg | Freq: Once | INTRAMUSCULAR | Status: AC
  Administered 2022-10-07: 4 mg via INTRAVENOUS
  Filled 2022-10-07: qty 2

## 2022-10-07 MED ORDER — OXYCODONE HCL 5 MG PO TABS
5.0000 mg | ORAL_TABLET | Freq: Four times a day (QID) | ORAL | Status: DC | PRN
  Administered 2022-10-07: 10 mg via ORAL
  Administered 2022-10-08: 5 mg via ORAL
  Administered 2022-10-08 – 2022-10-09 (×4): 10 mg via ORAL
  Administered 2022-10-10: 5 mg via ORAL
  Filled 2022-10-07 (×2): qty 2
  Filled 2022-10-07: qty 1
  Filled 2022-10-07: qty 2
  Filled 2022-10-07: qty 1
  Filled 2022-10-07 (×2): qty 2

## 2022-10-07 MED ORDER — HYDRALAZINE HCL 20 MG/ML IJ SOLN: 5.0000 mg | INTRAMUSCULAR | Status: DC | PRN

## 2022-10-07 MED ORDER — ACETAMINOPHEN 325 MG PO TABS
650.0000 mg | ORAL_TABLET | Freq: Four times a day (QID) | ORAL | Status: DC | PRN
  Administered 2022-10-08 – 2022-10-09 (×4): 650 mg via ORAL
  Filled 2022-10-07 (×4): qty 2

## 2022-10-07 MED ORDER — MONTELUKAST SODIUM 10 MG PO TABS
10.0000 mg | ORAL_TABLET | Freq: Every evening | ORAL | Status: DC
  Administered 2022-10-07 – 2022-10-08 (×2): 10 mg via ORAL
  Filled 2022-10-07 (×3): qty 1

## 2022-10-07 NOTE — ED Provider Notes (Signed)
[facility] Provider Note   Event Date/Time   First MD Initiated Contact with Patient 10/07/22 [phone]     (approximate) History  Shortness of Breath  HPI Catherine Giles is a 51 y.o. female with a stated past medical history of lupus, rheumatoid arthritis, fibromyalgia, and COPD who presents complaining of fever, cough, and worsening shortness of breath despite using all of her asthma medications on time and as prescribed.  Patient states that the symptoms are worsening over the last 3 days.  Patient states that her oxygen saturation was in the 80s with EMS before they placed her on 2 L nasal cannula.  Patient denies any recent travel or sick contacts. ROS: Patient currently denies any vision changes, tinnitus, difficulty speaking, facial droop, sore throat, chest pain, abdominal pain, nausea/vomiting/diarrhea, dysuria, or weakness/numbness/paresthesias in any extremity   Physical Exam  Triage Vital Signs: ED Triage Vitals [10/07/22 0803]  Enc Vitals Group     BP      Pulse      Resp      Temp      Temp src      SpO2 90 %     Weight      Height      Head Circumference      Peak Flow      Pain Score      Pain Loc      Pain Edu?      Excl. in [place]?    Most recent vital signs: Vitals:   10/07/22 0930 10/07/22 1000  BP: 127/89 122/70  Pulse: (!) 110 (!) 105  Resp: (!) 24 (!) 27  Temp:    SpO2: 98% 98%   General: Awake, oriented x4. CV:  Good peripheral perfusion.  Resp:  Increased effort.  Inspiratory and expiratory wheezing over bilateral lung fields.  2 L nasal cannula in place Abd:  No distention.  Other:  Middle-aged overweight African-American female laying in bed in no acute distress ED Results / Procedures / Treatments  Labs (all labs ordered are listed, but only abnormal results are displayed) Labs Reviewed  RESP PANEL BY RT-PCR (RSV, FLU A&B, COVID)  RVPGX2 - Abnormal; Notable for the following components:      Result Value   Influenza  A by PCR POSITIVE (*)    All other components within normal limits  COMPREHENSIVE METABOLIC PANEL - Abnormal; Notable for the following components:   Potassium 2.8 (*)    Glucose, Bld 191 (*)    Calcium 8.7 (*)    All other components within normal limits  CBC WITH DIFFERENTIAL/PLATELET  BRAIN NATRIURETIC PEPTIDE  TROPONIN I (HIGH SENSITIVITY)  TROPONIN I (HIGH SENSITIVITY)   EKG ED ECG REPORT I, Naaman Plummer, the attending physician, personally viewed and interpreted this ECG. Date: 10/07/2022 EKG Time: 0810 Rate: 109 Rhythm: Tachycardic sinus rhythm QRS Axis: normal Intervals: normal ST/T Wave abnormalities: normal Narrative Interpretation: Tachycardic sinus rhythm.  No evidence of acute ischemia RADIOLOGY ED MD interpretation: CT angiography of the chest with IV contrast shows no pulmonary embolism but appearance of the lungs most suggestive of severe bronchitis with developing multilobar bilateral bronchopneumonia.  This imaging was interpreted by me  Single view portable x-ray of the chest shows streaking and patchy bilateral lower lobe opacities suspicion for infectious versus pneumonia -Agree with radiology assessment Official radiology report(s): CT Angio Chest PE W/Cm &/Or Wo Cm  Result Date: 10/07/2022 CLINICAL DATA:  51 year old female with history of shortness  of breath. EXAM: CT ANGIOGRAPHY CHEST WITH CONTRAST TECHNIQUE: Multidetector CT imaging of the chest was performed using the standard protocol during bolus administration of intravenous contrast. Multiplanar CT image reconstructions and MIPs were obtained to evaluate the vascular anatomy. RADIATION DOSE REDUCTION: This exam was performed according to the departmental dose-optimization program which includes automated exposure control, adjustment of the mA and/or kV according to patient size and/or use of iterative reconstruction technique. CONTRAST:  22m OMNIPAQUE IOHEXOL 350 MG/ML SOLN COMPARISON:  Low-dose lung  cancer screening chest CT 08/08/2022. FINDINGS: Cardiovascular: No filling defects within the pulmonary arterial tree to suggest pulmonary embolism. Heart size is borderline enlarged. There is no significant pericardial fluid, thickening or pericardial calcification. Aortic atherosclerosis. No definite coronary artery calcifications. Mediastinum/Nodes: Multiple prominent but nonenlarged mediastinal and bilateral hilar lymph nodes, nonspecific. No definite pathologically enlarged mediastinal or hilar lymph nodes are noted. Retromandibular thyroid tissue (previously confirmed by biopsy on 05/17/2020), similar to prior examinations. No axillary lymphadenopathy. Lungs/Pleura: Diffuse bronchial wall thickening, widespread thickening of the peribronchovascular interstitium with patchy areas of peribronchovascular ground-glass attenuation and consolidative changes, and regional areas of architectural distortion and volume loss scattered throughout the lungs bilaterally, concerning for bronchitis and developing multilobar bilateral bronchopneumonia. No pleural effusions. Upper Abdomen: Unremarkable. Musculoskeletal: There are no aggressive appearing lytic or blastic lesions noted in the visualized portions of the skeleton. Review of the MIP images confirms the above findings. IMPRESSION: 1. No evidence of pulmonary embolism. 2. The appearance of the lungs is most suggestive of severe bronchitis with developing multilobar bilateral bronchopneumonia. 3. Aortic atherosclerosis. Aortic Atherosclerosis (ICD10-I70.0). Electronically Signed   By: DVinnie LangtonM.D.   On: 10/07/2022 10:14   DG Chest Port 1 View  Result Date: 10/07/2022 CLINICAL DATA:  Shortness of breath, fever and chills. EXAM: PORTABLE CHEST 1 VIEW COMPARISON:  09/10/2022 FINDINGS: The cardiomediastinal silhouette is unremarkable. Streaky/patchy bilateral LOWER lung opacities are identified. COPD/emphysema changes again noted. There is no evidence of  pneumothorax or large pleural effusion. No acute bony abnormalities are noted. IMPRESSION: Streaky/patchy bilateral LOWER lung opacities, suspicious for infection/pneumonia. Electronically Signed   By: JMargarette CanadaM.D.   On: 10/07/2022 08:59   PROCEDURES: Critical Care performed: Yes, see critical care procedure note(s) .1-3 Lead EKG Interpretation  Performed by: BNaaman Plummer MD Authorized by: BNaaman Plummer MD     Interpretation: abnormal     ECG rate:  106   ECG rate assessment: tachycardic     Rhythm: sinus tachycardia     Ectopy: none     Conduction: normal   CRITICAL CARE Performed by: ENaaman Plummer Total critical care time: 35 minutes  Critical care time was exclusive of separately billable procedures and treating other patients.  Critical care was necessary to treat or prevent imminent or life-threatening deterioration.  Critical care was time spent personally by me on the following activities: development of treatment plan with patient and/or surrogate as well as nursing, discussions with consultants, evaluation of patient's response to treatment, examination of patient, obtaining history from patient or surrogate, ordering and performing treatments and interventions, ordering and review of laboratory studies, ordering and review of radiographic studies, pulse oximetry and re-evaluation of patient's condition.  MEDICATIONS ORDERED IN ED: Medications  morphine (PF) 4 MG/ML injection 4 mg (has no administration in time range)  ondansetron (ZOFRAN) injection 4 mg (has no administration in time range)  ipratropium-albuterol (DUONEB) 0.5-2.5 (3) MG/3ML nebulizer solution 3 mL (has no administration in time range)  potassium chloride (KLOR-CON) packet 40 mEq (40 mEq Oral Given 10/07/22 1038)  methylPREDNISolone sodium succinate (SOLU-MEDROL) 125 mg/2 mL injection 125 mg (125 mg Intravenous Given 10/07/22 1022)  iohexol (OMNIPAQUE) 350 MG/ML injection 75 mL (75 mLs Intravenous  Contrast Given 10/07/22 0945)   IMPRESSION / MDM / ASSESSMENT AND PLAN / ED COURSE  I reviewed the triage vital signs and the nursing notes.                             The patient is on the cardiac monitor to evaluate for evidence of arrhythmia and/or significant heart rate changes. Patient's presentation is most consistent with acute presentation with potential threat to life or bodily function. Presentation most consistent with Viral Syndrome.  Patient has tested positive for influenza A. At this time patient is requiring supplemental oxygenation due to acute hypoxic respiratory failure.  Given History and Exam I have a lower suspicion for: Emergent CardioPulmonary causes [such as Acute Asthma or COPD Exacerbation, acute Heart Failure or exacerbation, PE, PTX, atypical ACS, PNA]. Emergent Otolaryngeal causes [such as PTA, RPA, Ludwigs, Epiglottitis, EBV].  Regarding Emergent Travel or Immunosuppressive related infectious: I have a low suspicion for acute HIV.  Given radiologic evidence for patchy bilateral airspace opacities concerning for viral pneumonia, continued need for supplemental oxygenation due to acute hypoxic respiratory failure, and need for further evaluation and management, patient will require admission  Dispo: Admit to medicine   FINAL CLINICAL IMPRESSION(S) / ED DIAGNOSES   Final diagnoses:  Acute respiratory failure with hypoxia (HCC)  Upper respiratory tract infection due to influenza  Bronchopneumonia   Rx / DC Orders   ED Discharge Orders     None      Note:  This document was prepared using Dragon voice recognition software and may include unintentional dictation errors.   Naaman Plummer, MD 10/07/22 1041

## 2022-10-07 NOTE — ED Triage Notes (Signed)
Pt to ED via [facility]. Pt reports difficulty breathing x2 days, fever, chills, n/v, body aches, headaches,Pt reports a recent exposure to Covid and others in home have experienced symptoms. Pt self-administered a breathing treatment prior to EMS arrival, hx of COPD. O2 was 90 on EMS arrival.

## 2022-10-07 NOTE — H&P (Signed)
History and Physical    Catherine Giles JFH:[id number] DOB: [birth year] DOA: 10/07/2022  Referring MD/NP/PA:   PCP: [facility]   Patient coming from:  The patient is coming from home.  At baseline, pt is independent for most of ADL.        Chief Complaint: SOB  HPI: Catherine Giles is a 51 y.o. female with medical history significant of hypertension, diabetes mellitus, COPD, asthma, GERD, hypothyroidism, depression, lupus, rheumatoid arthritis, fibromyalgia, diastolic CHF, tobacco abuse, obesity with a BMI 29.88, who presents with shortness breath.  Patient states that she has a shortness of breath for more than 2 days, which has been progressively worsening.  Patient has dry cough, chest congestion, subjective fever, chills, headache, body aches, malaise.  She states that she was not using oxygen until 2 weeks ago when she started 2 L oxygen, but she states that her current sickness started 2 days ago.  Her oxygen saturation is 80s% on room air, which improved to 98% on 2 L oxygen.  She had several episodes of nausea, vomiting and diarrhea in the past 2 days, which has resolved today.  Currently no active nausea, vomiting, diarrhea or abdominal pain.  Denies symptoms of UTI.  Patient is taking prednisone 10 mg daily currently.  Of note, pt told me that her 2 daughters at home have similar symptoms with her. She asked me what to do for her daughter. I told her that they should reach out to their PCP for help, if they cannot reach PCP, then they should come to emergency room.  Data reviewed independently and ED Course: pt was found to have positive flu PCR, negative COVID PCR, troponin level 7, BNP 17.6, potassium 2.8, Mg 23, GFR> 60.  Temperature 99.8, blood pressure 122/70, heart rate of 110, RR 27.  Chest x-ray showed bilateral lower lobe patchy infiltration.  CTA negative for PE, but showed multifocal infiltration.  Patient is admitted to telemetry bed as  inpatient.   EKG: I have personally reviewed.  Sinus rhythm, QTc 465, early R wave progression, low voltage.   Review of Systems:   General: has subjective fevers, chills, no body weight gain, has poor appetite, has fatigue HEENT: no blurry vision, hearing changes or sore throat Respiratory: has dyspnea, coughing, wheezing CV: no chest pain, no palpitations GI: Currently no nausea, vomiting, abdominal pain, diarrhea, constipation GU: no dysuria, burning on urination, increased urinary frequency, hematuria  Ext: no leg edema Neuro: no unilateral weakness, numbness, or tingling, no vision change or hearing loss Skin: no rash, no skin tear. MSK: No muscle spasm, no deformity, no limitation of range of movement in spin Heme: No easy bruising.  Travel history: No recent long distant travel.   Allergy:  Allergies  Allergen Reactions   Azithromycin Anaphylaxis   Molds & Smuts Cough   Alpha-Gal    Penicillins     Did it involve swelling of the face/tongue/throat, SOB, or low BP? Yes Did it involve sudden or severe rash/hives, skin peeling, or any reaction on the inside of your mouth or nose? No Did you need to seek medical attention at a hospital or doctor's office? No When did it last happen?       If all above answers are "NO", may proceed with cephalosporin use.    Shellfish Allergy     Past Medical History:  Diagnosis Date   Arthritis    RA   Asthma    COPD (chronic obstructive pulmonary disease) (  [place])    Depression    Diabetes mellitus without complication ([place])    Emphysema of lung ([place])    Family history of breast cancer    6/21 cancer genetic testing letter sent   Fibromyalgia    Hypertension    Lupus ([place])    Pre-diabetes    Thyroid disease     Past Surgical History:  Procedure Laterality Date   CHOLECYSTECTOMY, LAPAROSCOPIC  02/2022   MOUTH SURGERY     TRANSTHORACIC ECHOCARDIOGRAM  04/2020   EF 60 to 65%.  Normal LV function.  No R WMA.  GR 1 DD.  Normal  RV size and function.  Normal PAP.  Normal atrial size.  Normal valves.    Social History:  reports that she has been smoking cigarettes. She has a 34.00 pack-year smoking history. She has never used smokeless tobacco. She reports current alcohol use. She reports that she does not currently use drugs.  Family History:  Family History  Problem Relation Age of Onset   Lung cancer Mother    Diabetes Mother    Breast cancer Mother 31   Hypertension Father    Cancer Maternal Aunt    Ovarian cancer Maternal Aunt    Diabetes Maternal Aunt    Diabetes Maternal Uncle    Hypertension Maternal Grandmother    Diabetes Maternal Grandmother      Prior to Admission medications   Medication Sig Start Date End Date Taking? Authorizing Provider  Acidophilus Lactobacillus CAPS Take 1 tablet by mouth daily.    [provider]  albuterol (PROVENTIL) (2.5 MG/3ML) 0.083% nebulizer solution Take 3 mLs (2.5 mg total) by nebulization every 6 (six) hours as needed for wheezing or shortness of breath. 07/25/21   Coral Spikes, DO  albuterol (VENTOLIN HFA) 108 (90 Base) MCG/ACT inhaler Inhale 2 puffs into the lungs every 4 (four) hours as needed.  02/12/19 11/22/21  [provider]  amLODipine (NORVASC) 10 MG tablet Take 1 tablet (10 mg total) by mouth daily. 09/05/21   Marnee Guarneri T, NP  aspirin 81 MG EC tablet Take 1 tablet (81 mg total) by mouth daily. Swallow whole. 11/09/21   Vigg, Avanti, MD  Blood Glucose Monitoring Suppl (ONETOUCH VERIO) w/Device KIT Use to check blood sugar 3 times daily with goal <130 fasting and <180 two hours after meal.  Bring meter to visits. 01/25/21   Cannady, Henrine Screws T, NP  busPIRone (BUSPAR) 5 MG tablet Take 1 tablet (5 mg total) by mouth 2 (two) times daily. 09/05/21   Cannady, Henrine Screws T, NP  cetirizine (ZYRTEC) 10 MG tablet Take 1 tablet (10 mg total) by mouth daily. 01/03/22   Kathrine Haddock, NP  cloNIDine (CATAPRES) 0.1 MG tablet Take 1 tablet (0.1 mg total) by  mouth 2 (two) times daily. 11/22/21   Minna Merritts, MD  dapagliflozin propanediol (FARXIGA) 10 MG TABS tablet Take 1 tablet (10 mg total) by mouth daily before breakfast. 03/16/21   Cannady, Henrine Screws T, NP  DULoxetine (CYMBALTA) 30 MG capsule Take 30 mg by mouth 2 (two) times daily. 10/25/21   [provider]  EPINEPHrine 0.3 mg/0.3 mL IJ SOAJ injection Inject 0.3 mg into the muscle as needed for anaphylaxis. 02/15/22   Cannady, Henrine Screws T, NP  famotidine (PEPCID) 20 MG tablet Take 20 mg by mouth 2 (two) times daily. 02/15/22   [provider]  Fluticasone-Umeclidin-Vilant (TRELEGY ELLIPTA) 100-62.5-25 MCG/INH AEPB Inhale 1 puff into the lungs daily. 11/29/20   [provider]  glucose blood test strip Use to check blood sugar 3 times daily with goal <130 fasting and <180 two hours after meal. 01/25/21   Cannady, Jolene T, NP  golimumab (SIMPONI ARIA) 50 MG/4ML SOLN injection Inject into the vein.    [provider]  hydrochlorothiazide (HYDRODIURIL) 25 MG tablet Take 1 tablet (25 mg total) by mouth daily. 11/22/21   Minna Merritts, MD  hydrOXYzine (VISTARIL) 25 MG capsule TAKE 1 CAPSULE BY MOUTH EVERY 8 HOURS ASNEEDED 01/03/22   Kathrine Haddock, NP  ibuprofen (ADVIL) 600 MG tablet Take 600 mg by mouth 3 (three) times daily. 02/15/22   [provider]  ipratropium-albuterol (DUONEB) 0.5-2.5 (3) MG/3ML SOLN Take 3 mLs by nebulization every 6 (six) hours as needed for up to 7 days. 10/30/21 11/22/21  Laurene Footman B, PA-C  Lancets [facility] ULTRASOFT) lancets Use to check blood sugar 3 times daily with goal <130 fasting and <180 two hours after meal. 01/25/21   Cannady, Jolene T, NP  leflunomide (ARAVA) 20 MG tablet Take 20 mg by mouth daily. 01/22/21   [provider]  levothyroxine (SYNTHROID) 50 MCG tablet Take 50 mcg by mouth daily before breakfast.     [provider]  metroNIDAZOLE (FLAGYL) 500 MG tablet Take 1 tablet (500 mg total) by mouth 2 (two)  times daily. 03/15/22   Wouk, Ailene Rud, MD  montelukast (SINGULAIR) 10 MG tablet Take 10 mg by mouth daily. 02/15/22   [provider]  oxyCODONE (OXY IR/ROXICODONE) 5 MG immediate release tablet Take 1-2 tablets (5-10 mg total) by mouth every 6 (six) hours as needed for moderate pain or severe pain. 03/15/22   Wouk, Ailene Rud, MD  polyethylene glycol (MIRALAX) 17 g packet Take 17 g by mouth daily. 03/15/22   Wouk, Ailene Rud, MD  predniSONE (DELTASONE) 50 MG tablet Take 1 pill daily for 5 days 09/10/22   Rada Hay, MD  promethazine-dextromethorphan (PROMETHAZINE-DM) 6.25-15 MG/5ML syrup Take 5 mLs by mouth 4 (four) times daily as needed. 06/23/22   Laurene Footman B, PA-C  sitaGLIPtin (JANUVIA) 100 MG tablet Take 1 tablet (100 mg total) by mouth daily. 12/02/21   Cannady, Henrine Screws T, NP  traZODone (DESYREL) 50 MG tablet Take 1 tablet (50 mg total) by mouth at bedtime. 01/05/20 07/01/20  Marnee Guarneri T, NP    Physical Exam: Vitals:   10/07/22 1405 10/07/22 1448 10/07/22 1540 10/07/22 1635  BP: 125/73 106/76 118/71 133/70  Pulse:  98 92 (!) 105  Resp:   20 16  Temp:      TempSrc:      SpO2:  90% 98% 100%  Weight:      Height:       General: Not in acute distress HEENT:       Eyes: PERRL, EOMI, no scleral icterus.       ENT: No discharge from the ears and nose, no pharynx injection, no tonsillar enlargement.        Neck: No JVD, no bruit, no mass felt. Heme: No neck lymph node enlargement. Cardiac: S1/S2, RRR, No murmurs, No gallops or rubs. Respiratory: Has wheezing bilaterally GI: Soft, nondistended, nontender, no rebound pain, no organomegaly, BS present. GU: No hematuria Ext: No pitting leg edema bilaterally. 1+DP/PT pulse bilaterally. Musculoskeletal: No joint deformities, No joint redness or warmth, no limitation of ROM in spin. Skin: No rashes.  Neuro: Alert, oriented X3, cranial nerves II-XII grossly intact, moves all extremities normally. Psych: Patient is  not psychotic, no  suicidal or hemocidal ideation.  Labs on Admission: I have personally reviewed following labs and imaging studies  CBC: Recent Labs  Lab 10/07/22 0817  WBC 5.0  NEUTROABS 2.6  HGB 13.9  HCT 43.2  MCV 95.2  PLT 616   Basic Metabolic Panel: Recent Labs  Lab 10/07/22 0817 10/07/22 0854  NA 136  --   K 2.8*  --   CL 100  --   CO2 26  --   GLUCOSE 191*  --   BUN 9  --   CREATININE 0.49  --   CALCIUM 8.7*  --   MG  --  2.3   GFR: Estimated Creatinine Clearance: 79.6 mL/min (by C-G formula based on SCr of 0.49 mg/dL). Liver Function Tests: Recent Labs  Lab 10/07/22 0817  AST 41  ALT 28  ALKPHOS 59  BILITOT 0.6  PROT 7.7  ALBUMIN 3.8   No results for input(s): "LIPASE", "AMYLASE" in the last 168 hours. No results for input(s): "AMMONIA" in the last 168 hours. Coagulation Profile: No results for input(s): "INR", "PROTIME" in the last 168 hours. Cardiac Enzymes: No results for input(s): "CKTOTAL", "CKMB", "CKMBINDEX", "TROPONINI" in the last 168 hours. BNP (last 3 results) No results for input(s): "PROBNP" in the last 8760 hours. HbA1C: No results for input(s): "HGBA1C" in the last 72 hours. CBG: Recent Labs  Lab 10/07/22 1155 10/07/22 1637  GLUCAP 139* 227*   Lipid Profile: No results for input(s): "CHOL", "HDL", "LDLCALC", "TRIG", "CHOLHDL", "LDLDIRECT" in the last 72 hours. Thyroid Function Tests: No results for input(s): "TSH", "T4TOTAL", "FREET4", "T3FREE", "THYROIDAB" in the last 72 hours. Anemia Panel: No results for input(s): "VITAMINB12", "FOLATE", "FERRITIN", "TIBC", "IRON", "RETICCTPCT" in the last 72 hours. Urine analysis: No results found for: "COLORURINE", "APPEARANCEUR", "LABSPEC", "PHURINE", "GLUCOSEU", "HGBUR", "BILIRUBINUR", "KETONESUR", "PROTEINUR", "UROBILINOGEN", "NITRITE", "LEUKOCYTESUR" Sepsis Labs: _0 (procalcitonin:4,lacticidven:4) ) Recent Results (from the past 240 hour(s))  Resp panel by RT-PCR (RSV,  Flu A&B, Covid) Anterior Nasal Swab     Status: Abnormal   Collection Time: 10/07/22  8:17 AM   Specimen: Anterior Nasal Swab  Result Value Ref Range Status   SARS Coronavirus 2 by RT PCR NEGATIVE NEGATIVE Final    Comment: (NOTE) SARS-CoV-2 target nucleic acids are NOT DETECTED.  The SARS-CoV-2 RNA is generally detectable in upper respiratory specimens during the acute phase of infection. The lowest concentration of SARS-CoV-2 viral copies this assay can detect is 138 copies/mL. A negative result does not preclude SARS-Cov-2 infection and should not be used as the sole basis for treatment or other patient management decisions. A negative result may occur with  improper specimen collection/handling, submission of specimen other than nasopharyngeal swab, presence of viral mutation(s) within the areas targeted by this assay, and inadequate number of viral copies(<138 copies/mL). A negative result must be combined with clinical observations, patient history, and epidemiological information. The expected result is Negative.  Fact Sheet for Patients:  EntrepreneurPulse.com.au  Fact Sheet for Healthcare Providers:  IncredibleEmployment.be  This test is no t yet approved or cleared by the [place] FDA and  has been authorized for detection and/or diagnosis of SARS-CoV-2 by FDA under an Emergency Use Authorization (EUA). This EUA will remain  in effect (meaning this test can be used) for the duration of the COVID-19 declaration under Section 564(b)(1) of the Act, 21 U.S.C.section 360bbb-3(b)(1), unless the authorization is terminated  or revoked sooner.       Influenza A by PCR POSITIVE (A) NEGATIVE Final   Influenza  B by PCR NEGATIVE NEGATIVE Final    Comment: (NOTE) The Xpert Xpress SARS-CoV-2/FLU/RSV plus assay is intended as an aid in the diagnosis of influenza from Nasopharyngeal swab specimens and should not be used as a sole basis for  treatment. Nasal washings and aspirates are unacceptable for Xpert Xpress SARS-CoV-2/FLU/RSV testing.  Fact Sheet for Patients: EntrepreneurPulse.com.au  Fact Sheet for Healthcare Providers: IncredibleEmployment.be  This test is not yet approved or cleared by the [place] FDA and has been authorized for detection and/or diagnosis of SARS-CoV-2 by FDA under an Emergency Use Authorization (EUA). This EUA will remain in effect (meaning this test can be used) for the duration of the COVID-19 declaration under Section 564(b)(1) of the Act, 21 U.S.C. section 360bbb-3(b)(1), unless the authorization is terminated or revoked.     Resp Syncytial Virus by PCR NEGATIVE NEGATIVE Final    Comment: (NOTE) Fact Sheet for Patients: EntrepreneurPulse.com.au  Fact Sheet for Healthcare Providers: IncredibleEmployment.be  This test is not yet approved or cleared by the [place] FDA and has been authorized for detection and/or diagnosis of SARS-CoV-2 by FDA under an Emergency Use Authorization (EUA). This EUA will remain in effect (meaning this test can be used) for the duration of the COVID-19 declaration under Section 564(b)(1) of the Act, 21 U.S.C. section 360bbb-3(b)(1), unless the authorization is terminated or revoked.  Performed at [facility], [address] [place], [place] [address]      Radiological Exams on Admission: CT Angio Chest PE W/Cm &/Or Wo Cm  Result Date: 10/07/2022 CLINICAL DATA:  51 year old female with history of shortness of breath. EXAM: CT ANGIOGRAPHY CHEST WITH CONTRAST TECHNIQUE: Multidetector CT imaging of the chest was performed using the standard protocol during bolus administration of intravenous contrast. Multiplanar CT image reconstructions and MIPs were obtained to evaluate the vascular anatomy. RADIATION DOSE REDUCTION: This exam was performed according to the  departmental dose-optimization program which includes automated exposure control, adjustment of the mA and/or kV according to patient size and/or use of iterative reconstruction technique. CONTRAST:  68m OMNIPAQUE IOHEXOL 350 MG/ML SOLN COMPARISON:  Low-dose lung cancer screening chest CT 08/08/2022. FINDINGS: Cardiovascular: No filling defects within the pulmonary arterial tree to suggest pulmonary embolism. Heart size is borderline enlarged. There is no significant pericardial fluid, thickening or pericardial calcification. Aortic atherosclerosis. No definite coronary artery calcifications. Mediastinum/Nodes: Multiple prominent but nonenlarged mediastinal and bilateral hilar lymph nodes, nonspecific. No definite pathologically enlarged mediastinal or hilar lymph nodes are noted. Retromandibular thyroid tissue (previously confirmed by biopsy on 05/17/2020), similar to prior examinations. No axillary lymphadenopathy. Lungs/Pleura: Diffuse bronchial wall thickening, widespread thickening of the peribronchovascular interstitium with patchy areas of peribronchovascular ground-glass attenuation and consolidative changes, and regional areas of architectural distortion and volume loss scattered throughout the lungs bilaterally, concerning for bronchitis and developing multilobar bilateral bronchopneumonia. No pleural effusions. Upper Abdomen: Unremarkable. Musculoskeletal: There are no aggressive appearing lytic or blastic lesions noted in the visualized portions of the skeleton. Review of the MIP images confirms the above findings. IMPRESSION: 1. No evidence of pulmonary embolism. 2. The appearance of the lungs is most suggestive of severe bronchitis with developing multilobar bilateral bronchopneumonia. 3. Aortic atherosclerosis. Aortic Atherosclerosis (ICD10-I70.0). Electronically Signed   By: DVinnie LangtonM.D.   On: 10/07/2022 10:14   DG Chest Port 1 View  Result Date: 10/07/2022 CLINICAL DATA:  Shortness of  breath, fever and chills. EXAM: PORTABLE CHEST 1 VIEW COMPARISON:  09/10/2022 FINDINGS: The cardiomediastinal silhouette is unremarkable. Streaky/patchy bilateral LOWER  lung opacities are identified. COPD/emphysema changes again noted. There is no evidence of pneumothorax or large pleural effusion. No acute bony abnormalities are noted. IMPRESSION: Streaky/patchy bilateral LOWER lung opacities, suspicious for infection/pneumonia. Electronically Signed   By: Margarette Canada M.D.   On: 10/07/2022 08:59      Assessment/Plan Principal Problem:   Influenza A Active Problems:   COPD exacerbation (HCC)   Sepsis ([place])   Type II diabetes mellitus with complication (HCC)   Chronic diastolic CHF (congestive heart failure) (HCC)   HTN (hypertension)   Hypothyroidism   Rheumatoid arthritis involving multiple sites with positive rheumatoid factor (HCC)   Depression with anxiety   Tobacco abuse   Obesity   Assessment and Plan:  Influenza A and COPD exacerbation: pt has flu a infection which triggered COPD exacerbation.  -will admit to tele bed as inpatient -Tamiflu -Bronchodilators -Solu-Medrol 40 mg IV bid -Mucinex for cough  -Incentive spirometry -sputum culture -Nasal cannula oxygen as needed to maintain O2 saturation 93% or greater  Sepsis due to Flu A infection: pt meets criteria for sepsis with heart rate of 110, RR 27.  Lactic acid is normal, 1.5. -Check procalcitonin level -1 L normal saline bolus  Type II diabetes mellitus with complication [facility]): Recent A1c 6.7, well-controlled.  Patient is taking Januvia and Farxiga at home -SSI  Chronic diastolic CHF (congestive heart failure) ([place]): 2D echo on 04/30/2020 showed EF of 60 to 65% with grade 1 diastolic dysfunction.  No leg edema or JVD.  BNP normal 17.6, CHF is compensated. -Watch volume status closely  HTN (hypertension) -IV hydralazine prn. -Hold HCTZ due to sepsis -Amlodipine, Cozaar  Hypothyroidism -Synthroid  Rheumatoid  arthritis involving multiple sites with positive rheumatoid factor ([place]): Patient still taking prednisone 10 mg daily, leflunomide at home -Patient is on Solu-Medrol 40 mg twice daily -Continue leflunomide  Depression and anxiety: -Continue home medications  Tobacco abuse -Nicotine patch  Obesity: BMI 29.88, body weight 76.5 kg. -Healthy diet and exercise -Encouraged losing weight -Patient is taking Wilder Glade which is also diabetes.    DVT ppx: SQ Lovenox  Code Status: Full code  Family Communication: not done, no family member is at bed side.   Disposition Plan:  Anticipate discharge back to previous environment  Consults called:  none  Admission status and Level of care: Telemetry Medical:   as inpt      Dispo: The patient is from: Home              Anticipated d/c is to: Home              Anticipated d/c date is: 2 days              Patient currently is not medically stable to d/c.    Severity of Illness:  The appropriate patient status for this patient is INPATIENT. Inpatient status is judged to be reasonable and necessary in order to provide the required intensity of service to ensure the patient's safety. The patient's presenting symptoms, physical exam findings, and initial radiographic and laboratory data in the context of their chronic comorbidities is felt to place them at high risk for further clinical deterioration. Furthermore, it is not anticipated that the patient will be medically stable for discharge from the hospital within 2 midnights of admission.   * I certify that at the point of admission it is my clinical judgment that the patient will require inpatient hospital care spanning beyond 2 midnights from the point of admission  due to high intensity of service, high risk for further deterioration and high frequency of surveillance required.*   :     Date of Service 10/07/2022    Ivor Costa [facility]   If 7PM-7AM, please contact  night-coverage [email or web address] 10/07/2022, 5:39 PM

## 2022-10-08 ENCOUNTER — Encounter: Payer: Self-pay | Admitting: Internal Medicine

## 2022-10-08 DIAGNOSIS — J101 Influenza due to other identified influenza virus with other respiratory manifestations: Secondary | ICD-10-CM | POA: Diagnosis not present

## 2022-10-08 LAB — CBC
HCT: 41.4 % (ref 36.0–46.0)
Hemoglobin: 13.5 g/dL (ref 12.0–15.0)
MCH: 31.3 pg (ref 26.0–34.0)
MCHC: 32.6 g/dL (ref 30.0–36.0)
MCV: 95.8 fL (ref 80.0–100.0)
Platelets: 258 10*3/uL (ref 150–400)
RBC: 4.32 MIL/uL (ref 3.87–5.11)
RDW: 14.5 % (ref 11.5–15.5)
WBC: 5.9 10*3/uL (ref 4.0–10.5)
nRBC: 0 % (ref 0.0–0.2)

## 2022-10-08 LAB — BASIC METABOLIC PANEL
Anion gap: 4 — ABNORMAL LOW (ref 5–15)
BUN: 12 mg/dL (ref 6–20)
CO2: 25 mmol/L (ref 22–32)
Calcium: 8.8 mg/dL — ABNORMAL LOW (ref 8.9–10.3)
Chloride: 110 mmol/L (ref 98–111)
Creatinine, Ser: 0.59 mg/dL (ref 0.44–1.00)
GFR, Estimated: >60 mL/min (ref >60)
Glucose, Bld: 182 mg/dL — ABNORMAL HIGH (ref 70–99)
Potassium: 4.6 mmol/L (ref 3.5–5.1)
Sodium: 139 mmol/L (ref 135–145)

## 2022-10-08 LAB — GLUCOSE, CAPILLARY
Glucose-Capillary: 162 mg/dL — ABNORMAL HIGH (ref 70–99)
Glucose-Capillary: 208 mg/dL — ABNORMAL HIGH (ref 70–99)
Glucose-Capillary: 172 mg/dL — ABNORMAL HIGH (ref 70–99)
Glucose-Capillary: 104 mg/dL — ABNORMAL HIGH (ref 70–99)

## 2022-10-08 MED ORDER — FLUTICASONE FUROATE-VILANTEROL 200-25 MCG/ACT IN AEPB
1.0000 | INHALATION_SPRAY | Freq: Every day | RESPIRATORY_TRACT | Status: DC
  Administered 2022-10-08 – 2022-10-10 (×3): 1 via RESPIRATORY_TRACT
  Filled 2022-10-08: qty 28

## 2022-10-08 MED ORDER — HEPARIN SODIUM (PORCINE) 5000 UNIT/ML IJ SOLN
5000.0000 [IU] | Freq: Three times a day (TID) | INTRAMUSCULAR | Status: DC
  Administered 2022-10-09: 5000 [IU] via SUBCUTANEOUS
  Filled 2022-10-08 (×3): qty 1

## 2022-10-08 MED ORDER — PREDNISONE 10 MG PO TABS: 10.0000 mg | ORAL_TABLET | Freq: Every day | ORAL | Status: DC

## 2022-10-08 MED ORDER — DAPAGLIFLOZIN PROPANEDIOL 10 MG PO TABS
10.0000 mg | ORAL_TABLET | Freq: Every day | ORAL | Status: DC
  Administered 2022-10-09 – 2022-10-10 (×2): 10 mg via ORAL
  Filled 2022-10-08 (×2): qty 1

## 2022-10-08 MED ORDER — UMECLIDINIUM BROMIDE 62.5 MCG/ACT IN AEPB
1.0000 | INHALATION_SPRAY | Freq: Every day | RESPIRATORY_TRACT | Status: DC
  Administered 2022-10-08 – 2022-10-10 (×3): 1 via RESPIRATORY_TRACT
  Filled 2022-10-08: qty 7

## 2022-10-08 MED ORDER — PREDNISONE 20 MG PO TABS
40.0000 mg | ORAL_TABLET | Freq: Every day | ORAL | Status: DC
  Administered 2022-10-09 – 2022-10-10 (×2): 40 mg via ORAL
  Filled 2022-10-08 (×2): qty 2

## 2022-10-08 MED ORDER — HYDROXYZINE HCL 50 MG PO TABS
25.0000 mg | ORAL_TABLET | Freq: Three times a day (TID) | ORAL | Status: DC | PRN
  Administered 2022-10-08 – 2022-10-09 (×2): 25 mg via ORAL
  Filled 2022-10-08 (×2): qty 1

## 2022-10-08 MED ORDER — HYDROXYZINE HCL 10 MG PO TABS: 25.0000 mg | ORAL_TABLET | Freq: Three times a day (TID) | ORAL | Status: DC | PRN

## 2022-10-08 NOTE — Progress Notes (Addendum)
[place] at [place] NAME: Catherine Giles    MR#:  [id number]  DATE OF BIRTH:  Jul 05, 1971  SUBJECTIVE:  no family at bedside patient feels tired since she has not slept. Complains of cough. Tells me she needs more oxygen right now. Explained to use more oxygen only if she is short winded and turn it down to 2 L addressed. No fever. Tolerating PO diet.    VITALS:  Blood pressure 112/70, pulse 84, temperature (!) 97.4 F (36.3 C), resp. rate 16, height '5\' 3"'$  (1.6 m), weight 76.5 kg, SpO2 94 %.  PHYSICAL EXAMINATION:   GENERAL:  51 y.o.-year-old patient lying in the bed with no acute distress.  LUNGS:coarse breath sounds bilaterally, no wheezing CARDIOVASCULAR: S1, S2 normal. No murmur   ABDOMEN: Soft, nontender, nondistended. Bowel sounds present.  EXTREMITIES: No  edema b/l.    NEUROLOGIC: nonfocal  patient is alert and awake SKIN: No obvious rash, lesion, or ulcer.   LABORATORY PANEL:  CBC Recent Labs  Lab 10/08/22 0456  WBC 5.9  HGB 13.5  HCT 41.4  PLT 258    Chemistries  Recent Labs  Lab 10/07/22 0817 10/07/22 0854 10/08/22 0456  NA 136  --  139  K 2.8*  --  4.6  CL 100  --  110  CO2 26  --  25  GLUCOSE 191*  --  182*  BUN 9  --  12  CREATININE 0.49  --  0.59  CALCIUM 8.7*  --  8.8*  MG  --  2.3  --   AST 41  --   --   ALT 28  --   --   ALKPHOS 59  --   --   BILITOT 0.6  --   --    Cardiac Enzymes No results for input(s): "TROPONINI" in the last 168 hours. RADIOLOGY:  CT Angio Chest PE W/Cm &/Or Wo Cm  Result Date: 10/07/2022 CLINICAL DATA:  51 year old female with history of shortness of breath. EXAM: CT ANGIOGRAPHY CHEST WITH CONTRAST TECHNIQUE: Multidetector CT imaging of the chest was performed using the standard protocol during bolus administration of intravenous contrast. Multiplanar CT image reconstructions and MIPs were obtained to evaluate the vascular anatomy. RADIATION DOSE REDUCTION: This exam was  performed according to the departmental dose-optimization program which includes automated exposure control, adjustment of the mA and/or kV according to patient size and/or use of iterative reconstruction technique. CONTRAST:  30m OMNIPAQUE IOHEXOL 350 MG/ML SOLN COMPARISON:  Low-dose lung cancer screening chest CT 08/08/2022. FINDINGS: Cardiovascular: No filling defects within the pulmonary arterial tree to suggest pulmonary embolism. Heart size is borderline enlarged. There is no significant pericardial fluid, thickening or pericardial calcification. Aortic atherosclerosis. No definite coronary artery calcifications. Mediastinum/Nodes: Multiple prominent but nonenlarged mediastinal and bilateral hilar lymph nodes, nonspecific. No definite pathologically enlarged mediastinal or hilar lymph nodes are noted. Retromandibular thyroid tissue (previously confirmed by biopsy on 05/17/2020), similar to prior examinations. No axillary lymphadenopathy. Lungs/Pleura: Diffuse bronchial wall thickening, widespread thickening of the peribronchovascular interstitium with patchy areas of peribronchovascular ground-glass attenuation and consolidative changes, and regional areas of architectural distortion and volume loss scattered throughout the lungs bilaterally, concerning for bronchitis and developing multilobar bilateral bronchopneumonia. No pleural effusions. Upper Abdomen: Unremarkable. Musculoskeletal: There are no aggressive appearing lytic or blastic lesions noted in the visualized portions of the skeleton. Review of the MIP images confirms the above findings. IMPRESSION: 1. No evidence of pulmonary embolism. 2. The  appearance of the lungs is most suggestive of severe bronchitis with developing multilobar bilateral bronchopneumonia. 3. Aortic atherosclerosis. Aortic Atherosclerosis (ICD10-I70.0). Electronically Signed   By: Vinnie Langton M.D.   On: 10/07/2022 10:14   DG Chest Port 1 View  Result Date:  10/07/2022 CLINICAL DATA:  Shortness of breath, fever and chills. EXAM: PORTABLE CHEST 1 VIEW COMPARISON:  09/10/2022 FINDINGS: The cardiomediastinal silhouette is unremarkable. Streaky/patchy bilateral LOWER lung opacities are identified. COPD/emphysema changes again noted. There is no evidence of pneumothorax or large pleural effusion. No acute bony abnormalities are noted. IMPRESSION: Streaky/patchy bilateral LOWER lung opacities, suspicious for infection/pneumonia. Electronically Signed   By: Margarette Canada M.D.   On: 10/07/2022 08:59    Assessment and Plan Catherine Giles is a 51 y.o. female with medical history significant of hypertension, diabetes mellitus, COPD, asthma, GERD, hypothyroidism, depression, lupus, rheumatoid arthritis, fibromyalgia, diastolic CHF, tobacco abuse, obesity with a BMI 29.88, who presents with shortness breath.   Influenza A and COPD exacerbation: pt has flu a infection which triggered COPD exacerbation.  -Tamiflu -Bronchodilators -Solu-Medrol 40 mg IV bid--prednisone 40 mg daily  for 4 days and then 10 mg qd(chronically) -Mucinex for cough  -Incentive spirometry -sputum culture -Nasal cannula oxygen as needed to maintain O2 saturation 93% or greater   Sepsis due to Flu A infection: pt meets criteria for sepsis with heart rate of 110, RR 27.  Lactic acid is normal, 1.5. -Check procalcitonin level--0.01 -sepsis improving   Type II diabetes mellitus with complication [facility]): Recent A1c 6.7, well-controlled.  Patient is taking [place] at home -SSI   Chronic diastolic CHF (congestive heart failure) ([place]): 2D echo on 04/30/2020 showed EF of 60 to 65% with grade 1 diastolic dysfunction.  No leg edema or JVD.  BNP normal 17.6, CHF is compensated. -stable   HTN (hypertension) -IV hydralazine prn. -will resume HCTZ ,Amlodipine, Cozaar   Hypothyroidism -Synthroid   Rheumatoid arthritis involving multiple sites with positive rheumatoid factor ([place]): Patient still  taking prednisone 10 mg daily, leflunomide at home -Continue leflunomide and steroids as above   Depression and anxiety: -Continue home medications   Tobacco abuse -Nicotine patch   Obesity: BMI 29.88, body weight 76.5 kg. -Healthy diet and exercise -Encouraged losing weight -Patient is taking Wilder Glade which is also diabetes.       DVT ppx: SQ Lovenox   Code Status: Full code    Family communication :none today CODE STATUS: full DVT Prophylaxis :lovenox Level of care: Telemetry Medical Status is: Inpatient Keep overnite to ensure resp status remains stable    TOTAL TIME TAKING CARE OF THIS PATIENT: 35 minutes.  >50% time spent on counselling and coordination of care  Note: This dictation was prepared with Dragon dictation along with smaller phrase technology. Any transcriptional errors that result from this process are unintentional.  Fritzi Mandes M.D    [facility]   CC: Primary care physician; Center, [facility]

## 2022-10-08 NOTE — Plan of Care (Signed)
  Problem: Education: Goal: Ability to describe self-care measures that may prevent or decrease complications (Diabetes Survival Skills Education) will improve Outcome: Progressing Goal: Individualized Educational Video(s) Outcome: Progressing   Problem: Coping: Goal: Ability to adjust to condition or change in health will improve Outcome: Progressing   Problem: Fluid Volume: Goal: Ability to maintain a balanced intake and output will improve Outcome: Progressing   Problem: Health Behavior/Discharge Planning: Goal: Ability to identify and utilize available resources and services will improve Outcome: Progressing Goal: Ability to manage health-related needs will improve Outcome: Progressing   Problem: Metabolic: Goal: Ability to maintain appropriate glucose levels will improve Outcome: Progressing   Problem: Nutritional: Goal: Maintenance of adequate nutrition will improve Outcome: Progressing Goal: Progress toward achieving an optimal weight will improve Outcome: Progressing   Problem: Skin Integrity: Goal: Risk for impaired skin integrity will decrease Outcome: Progressing   Problem: Tissue Perfusion: Goal: Adequacy of tissue perfusion will improve Outcome: Progressing   Problem: Education: Goal: Knowledge of disease or condition will improve Outcome: Progressing Goal: Knowledge of the prescribed therapeutic regimen will improve Outcome: Progressing Goal: Individualized Educational Video(s) Outcome: Progressing   Problem: Activity: Goal: Ability to tolerate increased activity will improve Outcome: Progressing Goal: Will verbalize the importance of balancing activity with adequate rest periods Outcome: Progressing   Problem: Respiratory: Goal: Ability to maintain a clear airway will improve Outcome: Progressing Goal: Levels of oxygenation will improve Outcome: Progressing Goal: Ability to maintain adequate ventilation will improve Outcome: Progressing    Problem: Education: Goal: Knowledge of General Education information will improve Description: Including pain rating scale, medication(s)/side effects and non-pharmacologic comfort measures Outcome: Progressing   Problem: Health Behavior/Discharge Planning: Goal: Ability to manage health-related needs will improve Outcome: Progressing   Problem: Clinical Measurements: Goal: Ability to maintain clinical measurements within normal limits will improve Outcome: Progressing Goal: Will remain free from infection Outcome: Progressing Goal: Diagnostic test results will improve Outcome: Progressing Goal: Respiratory complications will improve Outcome: Progressing Goal: Cardiovascular complication will be avoided Outcome: Progressing   Problem: Activity: Goal: Risk for activity intolerance will decrease Outcome: Progressing   Problem: Nutrition: Goal: Adequate nutrition will be maintained Outcome: Progressing   Problem: Coping: Goal: Level of anxiety will decrease Outcome: Progressing   Problem: Elimination: Goal: Will not experience complications related to bowel motility Outcome: Progressing Goal: Will not experience complications related to urinary retention Outcome: Progressing   Problem: Pain Managment: Goal: General experience of comfort will improve Outcome: Progressing   Problem: Safety: Goal: Ability to remain free from injury will improve Outcome: Progressing   Problem: Skin Integrity: Goal: Risk for impaired skin integrity will decrease Outcome: Progressing   

## 2022-10-08 NOTE — Plan of Care (Signed)
  Problem: Tissue Perfusion: Goal: Adequacy of tissue perfusion will improve Outcome: Progressing   Problem: Activity: Goal: Ability to tolerate increased activity will improve Outcome: Progressing   Problem: Respiratory: Goal: Levels of oxygenation will improve Outcome: Progressing

## 2022-10-09 DIAGNOSIS — J180 Bronchopneumonia, unspecified organism: Secondary | ICD-10-CM | POA: Diagnosis not present

## 2022-10-09 DIAGNOSIS — E118 Type 2 diabetes mellitus with unspecified complications: Secondary | ICD-10-CM | POA: Diagnosis not present

## 2022-10-09 DIAGNOSIS — J101 Influenza due to other identified influenza virus with other respiratory manifestations: Secondary | ICD-10-CM | POA: Diagnosis not present

## 2022-10-09 DIAGNOSIS — M0579 Rheumatoid arthritis with rheumatoid factor of multiple sites without organ or systems involvement: Secondary | ICD-10-CM

## 2022-10-09 LAB — GLUCOSE, CAPILLARY
Glucose-Capillary: 95 mg/dL (ref 70–99)
Glucose-Capillary: 114 mg/dL — ABNORMAL HIGH (ref 70–99)
Glucose-Capillary: 154 mg/dL — ABNORMAL HIGH (ref 70–99)
Glucose-Capillary: 148 mg/dL — ABNORMAL HIGH (ref 70–99)

## 2022-10-09 MED ORDER — IPRATROPIUM-ALBUTEROL 0.5-2.5 (3) MG/3ML IN SOLN
3.0000 mL | Freq: Two times a day (BID) | RESPIRATORY_TRACT | Status: DC
  Administered 2022-10-09 – 2022-10-10 (×2): 3 mL via RESPIRATORY_TRACT
  Filled 2022-10-09 (×2): qty 3

## 2022-10-09 MED ORDER — LEVOFLOXACIN 750 MG PO TABS
750.0000 mg | ORAL_TABLET | Freq: Every day | ORAL | Status: DC
  Administered 2022-10-09 – 2022-10-10 (×2): 750 mg via ORAL
  Filled 2022-10-09 (×2): qty 1

## 2022-10-09 MED ORDER — IPRATROPIUM-ALBUTEROL 0.5-2.5 (3) MG/3ML IN SOLN
3.0000 mL | Freq: Four times a day (QID) | RESPIRATORY_TRACT | Status: DC
  Administered 2022-10-09: 3 mL via RESPIRATORY_TRACT
  Filled 2022-10-09: qty 3

## 2022-10-09 NOTE — Progress Notes (Signed)
[place] at [place] NAME: Catherine Giles    MR#:  [id number]  DATE OF BIRTH:  1971/04/10  SUBJECTIVE:  no family at bedside patient feels tired since she has not slept. Complains of cough.No fever. Tolerating PO diet. Still has some coarse breath sounds. No fever.    VITALS:  Blood pressure 113/73, pulse 72, temperature 98.2 F (36.8 C), temperature source Oral, resp. rate 17, height '5\' 3"'$  (1.6 m), weight 76 kg, SpO2 100 %.  PHYSICAL EXAMINATION:   GENERAL:  51 y.o.-year-old patient lying in the bed with no acute distress.  LUNGS:coarse breath sounds bilaterally, no wheezing CARDIOVASCULAR: S1, S2 normal. No murmur   ABDOMEN: Soft, nontender, nondistended. Bowel sounds present.  EXTREMITIES: No  edema b/l.    NEUROLOGIC: nonfocal  patient is alert and awake SKIN: No obvious rash, lesion, or ulcer.   LABORATORY PANEL:  CBC Recent Labs  Lab 10/08/22 0456  WBC 5.9  HGB 13.5  HCT 41.4  PLT 258     Chemistries  Recent Labs  Lab 10/07/22 0817 10/07/22 0854 10/08/22 0456  NA 136  --  139  K 2.8*  --  4.6  CL 100  --  110  CO2 26  --  25  GLUCOSE 191*  --  182*  BUN 9  --  12  CREATININE 0.49  --  0.59  CALCIUM 8.7*  --  8.8*  MG  --  2.3  --   AST 41  --   --   ALT 28  --   --   ALKPHOS 59  --   --   BILITOT 0.6  --   --     Assessment and Plan Catherine Giles is a 51 y.o. female with medical history significant of hypertension, diabetes mellitus, COPD, asthma, GERD, hypothyroidism, depression, lupus, rheumatoid arthritis, fibromyalgia, diastolic CHF, tobacco abuse, obesity with a BMI 29.88, who presents with shortness breath.   Influenza A and COPD exacerbation acute bronchitis/bronchopneumonia as noted on CT chest : pt has flu a infection which triggered COPD exacerbation.  -Tamiflu -Bronchodilators -Solu-Medrol 40 mg IV bid--prednisone 40 mg daily  for 4 days and then 10 mg qd(chronically) -Mucinex for  cough  -Incentive spirometry -sputum culture -Nasal cannula oxygen as needed to maintain O2 saturation 93% or greater -- given emphysema/COPD and ongoing cough will treat empirically with antibiotic. Started on Levaquin.   Sepsis due to Flu A infection: pt meets criteria for sepsis with heart rate of 110, RR 27.  Lactic acid is normal, 1.5. -Check procalcitonin level--0.01 -sepsis improving   Type II diabetes mellitus with complication [facility]): Recent A1c 6.7, well-controlled.  Patient is taking [place] at home -SSI   Chronic diastolic CHF (congestive heart failure) ([place]): 2D echo on 04/30/2020 showed EF of 60 to 65% with grade 1 diastolic dysfunction.  No leg edema or JVD.  BNP normal 17.6, CHF is compensated. -stable   HTN (hypertension) -IV hydralazine prn. -will resume HCTZ ,Amlodipine, Cozaar   Hypothyroidism -Synthroid   Rheumatoid arthritis involving multiple sites with positive rheumatoid factor ([place]): Patient still taking prednisone 10 mg daily, leflunomide at home -Continue leflunomide and steroids as above   Depression and anxiety: -Continue home medications   Tobacco abuse -Nicotine patch   Obesity: BMI 29.88, body weight 76.5 kg. -Healthy diet and exercise -Encouraged losing weight -Patient is taking Wilder Glade which is also diabetes.       DVT ppx: SQ Lovenox  Code Status: Full code    Family communication :none today CODE STATUS: full DVT Prophylaxis :lovenox Level of care: Med-Surg Status is: Inpatient Keep overnite to ensure resp status remains stable. Started on PO antibiotic. Hoping to discharge tomorrow if remains stable. Patient in agreement    TOTAL TIME TAKING CARE OF THIS PATIENT: 35 minutes.  >50% time spent on counselling and coordination of care  Note: This dictation was prepared with Dragon dictation along with smaller phrase technology. Any transcriptional errors that result from this process are unintentional.  Fritzi Mandes M.D     [facility]   CC: Primary care physician; Center, [facility]

## 2022-10-09 NOTE — Progress Notes (Signed)
  Transition of Care [facility]) Screening Note   Patient Details  Name: Catherine Giles Date of Birth: 1970-11-24   Transition of Care [facility]) CM/SW Contact:    Quin Hoop, LCSW Phone Number: 10/09/2022, 9:21 AM    Transition of Care Department [facility]) has reviewed patient and no TOC needs have been identified at this time. We will continue to monitor patient advancement through interdisciplinary progression rounds. If new patient transition needs arise, please place a TOC consult.

## 2022-10-09 NOTE — Progress Notes (Signed)
Patient is requesting a stool softener to be added to her medication list.

## 2022-10-10 DIAGNOSIS — J101 Influenza due to other identified influenza virus with other respiratory manifestations: Secondary | ICD-10-CM | POA: Diagnosis not present

## 2022-10-10 LAB — GLUCOSE, CAPILLARY: Glucose-Capillary: 90 mg/dL (ref 70–99)

## 2022-10-10 MED ORDER — OSELTAMIVIR PHOSPHATE 75 MG PO CAPS: 75.0000 mg | ORAL_CAPSULE | Freq: Two times a day (BID) | ORAL | 0 refills | Status: AC

## 2022-10-10 MED ORDER — NICOTINE 21 MG/24HR TD PT24: 21.0000 mg | MEDICATED_PATCH | Freq: Every day | TRANSDERMAL | 0 refills | Status: DC

## 2022-10-10 MED ORDER — IBUPROFEN 600 MG PO TABS: 600.0000 mg | ORAL_TABLET | Freq: Three times a day (TID) | ORAL | 0 refills | Status: DC | PRN

## 2022-10-10 MED ORDER — LEVOFLOXACIN 750 MG PO TABS: 750.0000 mg | ORAL_TABLET | Freq: Every day | ORAL | 0 refills | Status: AC

## 2022-10-10 MED ORDER — OXYCODONE HCL 5 MG PO TABS: 5.0000 mg | ORAL_TABLET | Freq: Three times a day (TID) | ORAL | 0 refills | Status: DC | PRN

## 2022-10-10 NOTE — Care Management Important Message (Signed)
Important Message  Patient Details  Name: Catherine Giles MRN: [id number] Date of Birth: [birth year]   Medicare Important Message Given:  N/A - LOS <3 / Initial given by admissions     Catherine Giles 10/10/2022, 7:37 AM

## 2022-10-10 NOTE — Discharge Summary (Signed)
Physician Discharge Summary   Patient: Catherine Giles MRN: [id number] DOB: Sep 27, 1971  Admit date:     10/07/2022  Discharge date: 10/10/22  Discharge Physician: Fritzi Mandes   PCP: [facility]   Recommendations at discharge:    F/u PCP in 1 week Stop smoking Use oxygen, nebs and inhalers as before  Discharge Diagnoses: Principal Problem:   Influenza A Active Problems:   COPD exacerbation ([place])   Sepsis ([place])   Type II diabetes mellitus with complication ([place])   Chronic diastolic CHF (congestive heart failure) (H[facility])   HTN (hypertension)   Hypothyroidism   Rheumatoid arthritis involving multiple sites with positive rheumatoid factor ([place])   Depression with anxiety   Tobacco abuse   Obesity   Bronchopneumonia   Hospital Course:  Catherine Giles is a 51 y.o. female with medical history significant of hypertension, diabetes mellitus, COPD, asthma, GERD, hypothyroidism, depression, lupus, rheumatoid arthritis, fibromyalgia, diastolic CHF, tobacco abuse, obesity with a BMI 29.88, who presents with shortness breath.    Influenza A and COPD exacerbation acute bronchitis/bronchopneumonia as noted on CT chest :-pt has flu a infection which triggered COPD exacerbation.  -Tamiflu -Bronchodilators -Solu-Medrol 40 mg IV bid--prednisone 40 mg daily  and then 10 mg qd(chronically) -Mucinex for cough  -Incentive spirometry -Nasal cannula oxygen as needed to maintain O2 saturation 93% or greater -- given emphysema/COPD and ongoing cough with CT abnormal findings will treat empirically with antibiotic. Started on Levaquin.   Sepsis due to Flu A infection: pt meets criteria for sepsis with heart rate of 110, RR 27.  Lactic acid is normal, 1.5. -Check procalcitonin level--0.01 -sepsis improving   Type II diabetes mellitus with complication [facility]): Recent A1c 6.7, well-controlled.  Patient is taking [place] at home -SSI   Chronic diastolic CHF (congestive  heart failure) ([place]): 2D echo on 04/30/2020 showed EF of 60 to 65% with grade 1 diastolic dysfunction.  No leg edema or JVD.  BNP normal 17.6, CHF is compensated. -stable   HTN (hypertension) -IV hydralazine prn. -will resume HCTZ ,Amlodipine, Cozaar   Hypothyroidism -Synthroid   Rheumatoid arthritis involving multiple sites with positive rheumatoid factor ([place]): Patient still taking prednisone 10 mg daily, leflunomide at home -Continue leflunomide and steroids as above   Depression and anxiety: -Continue home medications   Tobacco abuse -Nicotine patch   Obesity: BMI 29.88, body weight 76.5 kg. -Healthy diet and exercise -Encouraged losing weight -Patient is taking [place] which is also diabetes.       DVT ppx: SQ Lovenox   Code Status: Full code     Pain control - [place] Controlled Substance Reporting System database was reviewed. and patient was instructed, not to drive, operate heavy machinery, perform activities at heights, swimming or participation in water activities or provide baby-sitting services while on Pain, Sleep and Anxiety Medications; until their outpatient Physician has advised to do so again. Also recommended to not to take more than prescribed Pain, Sleep and Anxiety Medications.  Consultants: none Disposition: Home Diet recommendation:  Discharge Diet Orders (From admission, onward)     Start     Ordered   10/10/22 0000  Diet - low sodium heart healthy        10/10/22 0957           Cardiac and Carb modified diet DISCHARGE MEDICATION: Allergies as of 10/10/2022       Reactions   Azithromycin Anaphylaxis   Molds & Smuts Cough   Alpha-gal  Penicillins    Did it involve swelling of the face/tongue/throat, SOB, or low BP? Yes Did it involve sudden or severe rash/hives, skin peeling, or any reaction on the inside of your mouth or nose? No Did you need to seek medical attention at a hospital or doctor's office? No When did it last  happen?       If all above answers are "NO", may proceed with cephalosporin use.   Shellfish Allergy         Medication List     TAKE these medications    Acidophilus Lactobacillus Caps Take 1 tablet by mouth daily.   albuterol 108 (90 Base) MCG/ACT inhaler Commonly known as: VENTOLIN HFA Inhale 2 puffs into the lungs every 4 (four) hours as needed.   albuterol (2.5 MG/3ML) 0.083% nebulizer solution Commonly known as: PROVENTIL Take 3 mLs (2.5 mg total) by nebulization every 6 (six) hours as needed for wheezing or shortness of breath.   amLODipine 10 MG tablet Commonly known as: NORVASC Take 1 tablet (10 mg total) by mouth daily.   cetirizine 10 MG tablet Commonly known as: ZYRTEC Take 1 tablet (10 mg total) by mouth daily.   dapagliflozin propanediol 10 MG Tabs tablet Commonly known as: Farxiga Take 1 tablet (10 mg total) by mouth daily before breakfast.   DULoxetine 30 MG capsule Commonly known as: CYMBALTA Take 30 mg by mouth 2 (two) times daily.   EPINEPHrine 0.3 mg/0.3 mL Soaj injection Commonly known as: EPI-PEN Inject 0.3 mg into the muscle as needed for anaphylaxis.   famotidine 20 MG tablet Commonly known as: PEPCID Take 20 mg by mouth 2 (two) times daily.   glucose blood test strip Use to check blood sugar 3 times daily with goal <130 fasting and <180 two hours after meal.   hydrOXYzine 25 MG capsule Commonly known as: VISTARIL TAKE 1 CAPSULE BY MOUTH EVERY 8 HOURS ASNEEDED   ibuprofen 600 MG tablet Commonly known as: ADVIL Take 1 tablet (600 mg total) by mouth every 8 (eight) hours as needed. What changed:  when to take this reasons to take this   ipratropium-albuterol 0.5-2.5 (3) MG/3ML Soln Commonly known as: DUONEB Take 3 mLs by nebulization every 6 (six) hours as needed for up to 7 days.   leflunomide 20 MG tablet Commonly known as: ARAVA Take 20 mg by mouth daily.   levofloxacin 750 MG tablet Commonly known as: LEVAQUIN Take 1  tablet (750 mg total) by mouth daily for 4 days. Start taking on: October 11, 2022   levothyroxine 50 MCG tablet Commonly known as: SYNTHROID Take 50 mcg by mouth daily before breakfast.   losartan 100 MG tablet Commonly known as: COZAAR Take 100 mg by mouth daily.   montelukast 10 MG tablet Commonly known as: SINGULAIR Take 10 mg by mouth daily.   nicotine 21 mg/24hr patch Commonly known as: NICODERM CQ - dosed in mg/24 hours Place 1 patch (21 mg total) onto the skin daily. Start taking on: October 11, 2022   onetouch ultrasoft lancets Use to check blood sugar 3 times daily with goal <130 fasting and <180 two hours after meal.   OneTouch Verio w/Device Kit Use to check blood sugar 3 times daily with goal <130 fasting and <180 two hours after meal.  Bring meter to visits.   oseltamivir 75 MG capsule Commonly known as: TAMIFLU Take 1 capsule (75 mg total) by mouth 2 (two) times daily for 2 days.   oxyCODONE 5 MG immediate  release tablet Commonly known as: Oxy IR/ROXICODONE Take 1 tablet (5 mg total) by mouth every 8 (eight) hours as needed for moderate pain or severe pain. What changed:  how much to take when to take this   polyethylene glycol 17 g packet Commonly known as: MiraLax Take 17 g by mouth daily.   predniSONE 10 MG tablet Commonly known as: DELTASONE Take 10 mg by mouth daily with breakfast.   promethazine-dextromethorphan 6.25-15 MG/5ML syrup Commonly known as: PROMETHAZINE-DM Take 5 mLs by mouth 4 (four) times daily as needed.   Simponi Aria 50 MG/4ML Soln injection Generic drug: golimumab Inject into the vein.   Trelegy Ellipta 200-62.5-25 MCG/ACT Aepb Generic drug: Fluticasone-Umeclidin-Vilant Inhale 1 puff into the lungs daily.        [place], [facility]. Schedule an appointment as soon as possible for a visit in 1 week(s).   Why: hospital f/u Contact information: [address] [place]  [phone]                 Filed Weights   10/07/22 0811 10/08/22 0500 10/09/22 0500  Weight: 76.5 kg 76.5 kg 76 kg   GENERAL:  51 y.o.-year-old patient lying in the bed with no acute distress.  LUNGS:scattered coarse breath sounds bilaterally, no wheezing CARDIOVASCULAR: S1, S2 normal. No murmur   ABDOMEN: Soft, nontender, nondistended. Bowel sounds present.  EXTREMITIES: No  edema b/l.    NEUROLOGIC: nonfocal  patient is alert and awake SKIN: No obvious rash, lesion, or ulcer.     Condition at discharge: fair  The results of significant diagnostics from this hospitalization (including imaging, microbiology, ancillary and laboratory) are listed below for reference.   Imaging Studies: CT Angio Chest PE W/Cm &/Or Wo Cm  Result Date: 10/07/2022 CLINICAL DATA:  51 year old female with history of shortness of breath. EXAM: CT ANGIOGRAPHY CHEST WITH CONTRAST TECHNIQUE: Multidetector CT imaging of the chest was performed using the standard protocol during bolus administration of intravenous contrast. Multiplanar CT image reconstructions and MIPs were obtained to evaluate the vascular anatomy. RADIATION DOSE REDUCTION: This exam was performed according to the departmental dose-optimization program which includes automated exposure control, adjustment of the mA and/or kV according to patient size and/or use of iterative reconstruction technique. CONTRAST:  35m OMNIPAQUE IOHEXOL 350 MG/ML SOLN COMPARISON:  Low-dose lung cancer screening chest CT 08/08/2022. FINDINGS: Cardiovascular: No filling defects within the pulmonary arterial tree to suggest pulmonary embolism. Heart size is borderline enlarged. There is no significant pericardial fluid, thickening or pericardial calcification. Aortic atherosclerosis. No definite coronary artery calcifications. Mediastinum/Nodes: Multiple prominent but nonenlarged mediastinal and bilateral hilar lymph nodes, nonspecific. No definite  pathologically enlarged mediastinal or hilar lymph nodes are noted. Retromandibular thyroid tissue (previously confirmed by biopsy on 05/17/2020), similar to prior examinations. No axillary lymphadenopathy. Lungs/Pleura: Diffuse bronchial wall thickening, widespread thickening of the peribronchovascular interstitium with patchy areas of peribronchovascular ground-glass attenuation and consolidative changes, and regional areas of architectural distortion and volume loss scattered throughout the lungs bilaterally, concerning for bronchitis and developing multilobar bilateral bronchopneumonia. No pleural effusions. Upper Abdomen: Unremarkable. Musculoskeletal: There are no aggressive appearing lytic or blastic lesions noted in the visualized portions of the skeleton. Review of the MIP images confirms the above findings. IMPRESSION: 1. No evidence of pulmonary embolism. 2. The appearance of the lungs is most suggestive of severe bronchitis with developing multilobar bilateral bronchopneumonia. 3. Aortic atherosclerosis. Aortic Atherosclerosis (ICD10-I70.0). Electronically Signed   By: DQuillian Quince  Entrikin M.D.   On: 10/07/2022 10:14   DG Chest Port 1 View  Result Date: 10/07/2022 CLINICAL DATA:  Shortness of breath, fever and chills. EXAM: PORTABLE CHEST 1 VIEW COMPARISON:  09/10/2022 FINDINGS: The cardiomediastinal silhouette is unremarkable. Streaky/patchy bilateral LOWER lung opacities are identified. COPD/emphysema changes again noted. There is no evidence of pneumothorax or large pleural effusion. No acute bony abnormalities are noted. IMPRESSION: Streaky/patchy bilateral LOWER lung opacities, suspicious for infection/pneumonia. Electronically Signed   By: Margarette Canada M.D.   On: 10/07/2022 08:59    Microbiology: Results for orders placed or performed during the hospital encounter of 10/07/22  Resp panel by RT-PCR (RSV, Flu A&B, Covid) Anterior Nasal Swab     Status: Abnormal   Collection Time: 10/07/22  8:17  AM   Specimen: Anterior Nasal Swab  Result Value Ref Range Status   SARS Coronavirus 2 by RT PCR NEGATIVE NEGATIVE Final    Comment: (NOTE) SARS-CoV-2 target nucleic acids are NOT DETECTED.  The SARS-CoV-2 RNA is generally detectable in upper respiratory specimens during the acute phase of infection. The lowest concentration of SARS-CoV-2 viral copies this assay can detect is 138 copies/mL. A negative result does not preclude SARS-Cov-2 infection and should not be used as the sole basis for treatment or other patient management decisions. A negative result may occur with  improper specimen collection/handling, submission of specimen other than nasopharyngeal swab, presence of viral mutation(s) within the areas targeted by this assay, and inadequate number of viral copies(<138 copies/mL). A negative result must be combined with clinical observations, patient history, and epidemiological information. The expected result is Negative.  Fact Sheet for Patients:  EntrepreneurPulse.com.au  Fact Sheet for Healthcare Providers:  IncredibleEmployment.be  This test is no t yet approved or cleared by the [place] FDA and  has been authorized for detection and/or diagnosis of SARS-CoV-2 by FDA under an Emergency Use Authorization (EUA). This EUA will remain  in effect (meaning this test can be used) for the duration of the COVID-19 declaration under Section 564(b)(1) of the Act, 21 U.S.C.section 360bbb-3(b)(1), unless the authorization is terminated  or revoked sooner.       Influenza A by PCR POSITIVE (A) NEGATIVE Final   Influenza B by PCR NEGATIVE NEGATIVE Final    Comment: (NOTE) The Xpert Xpress SARS-CoV-2/FLU/RSV plus assay is intended as an aid in the diagnosis of influenza from Nasopharyngeal swab specimens and should not be used as a sole basis for treatment. Nasal washings and aspirates are unacceptable for Xpert Xpress  SARS-CoV-2/FLU/RSV testing.  Fact Sheet for Patients: EntrepreneurPulse.com.au  Fact Sheet for Healthcare Providers: IncredibleEmployment.be  This test is not yet approved or cleared by the [place] FDA and has been authorized for detection and/or diagnosis of SARS-CoV-2 by FDA under an Emergency Use Authorization (EUA). This EUA will remain in effect (meaning this test can be used) for the duration of the COVID-19 declaration under Section 564(b)(1) of the Act, 21 U.S.C. section 360bbb-3(b)(1), unless the authorization is terminated or revoked.     Resp Syncytial Virus by PCR NEGATIVE NEGATIVE Final    Comment: (NOTE) Fact Sheet for Patients: EntrepreneurPulse.com.au  Fact Sheet for Healthcare Providers: IncredibleEmployment.be  This test is not yet approved or cleared by the [place] FDA and has been authorized for detection and/or diagnosis of SARS-CoV-2 by FDA under an Emergency Use Authorization (EUA). This EUA will remain in effect (meaning this test can be used) for the duration of the COVID-19 declaration under Section 564(b)(1)  of the Act, 21 U.S.C. section 360bbb-3(b)(1), unless the authorization is terminated or revoked.  Performed at [facility] [address] [place], [place] [address]     Labs: CBC: Recent Labs  Lab 10/07/22 0817 10/08/22 0456  WBC 5.0 5.9  NEUTROABS 2.6  --   HGB 13.9 13.5  HCT 43.2 41.4  MCV 95.2 95.8  PLT 260 496   Basic Metabolic Panel: Recent Labs  Lab 10/07/22 0817 10/07/22 0854 10/08/22 0456  NA 136  --  139  K 2.8*  --  4.6  CL 100  --  110  CO2 26  --  25  GLUCOSE 191*  --  182*  BUN 9  --  12  CREATININE 0.49  --  0.59  CALCIUM 8.7*  --  8.8*  MG  --  2.3  --    Liver Function Tests: Recent Labs  Lab 10/07/22 0817  AST 41  ALT 28  ALKPHOS 59  BILITOT 0.6  PROT 7.7  ALBUMIN 3.8   CBG: Recent Labs  Lab  10/09/22 0810 10/09/22 1131 10/09/22 1543 10/09/22 2000 10/10/22 0824  GLUCAP 95 114* 154* 148* 90    Discharge time spent: greater than 30 minutes.  Signed: Fritzi Mandes, MD [facility] 10/10/2022

## 2022-10-10 NOTE — Plan of Care (Signed)
Patient AOX4, VSS throughout shift.  Pt c/o pain relieved by PRN pain med.  Diminished lungs, IS encouraged.  All meds given on time as ordered.  Pt ambulated to bathroom.  POC maintained, will continue to monitor.  Problem: Education: Goal: Ability to describe self-care measures that may prevent or decrease complications (Diabetes Survival Skills Education) will improve Outcome: Progressing Goal: Individualized Educational Video(s) Outcome: Progressing   Problem: Coping: Goal: Ability to adjust to condition or change in health will improve Outcome: Progressing   Problem: Fluid Volume: Goal: Ability to maintain a balanced intake and output will improve Outcome: Progressing   Problem: Health Behavior/Discharge Planning: Goal: Ability to identify and utilize available resources and services will improve Outcome: Progressing Goal: Ability to manage health-related needs will improve Outcome: Progressing   Problem: Metabolic: Goal: Ability to maintain appropriate glucose levels will improve Outcome: Progressing   Problem: Nutritional: Goal: Maintenance of adequate nutrition will improve Outcome: Progressing Goal: Progress toward achieving an optimal weight will improve Outcome: Progressing   Problem: Skin Integrity: Goal: Risk for impaired skin integrity will decrease Outcome: Progressing   Problem: Tissue Perfusion: Goal: Adequacy of tissue perfusion will improve Outcome: Progressing   Problem: Education: Goal: Knowledge of disease or condition will improve Outcome: Progressing Goal: Knowledge of the prescribed therapeutic regimen will improve Outcome: Progressing Goal: Individualized Educational Video(s) Outcome: Progressing   Problem: Activity: Goal: Ability to tolerate increased activity will improve Outcome: Progressing Goal: Will verbalize the importance of balancing activity with adequate rest periods Outcome: Progressing   Problem: Respiratory: Goal: Ability  to maintain a clear airway will improve Outcome: Progressing Goal: Levels of oxygenation will improve Outcome: Progressing Goal: Ability to maintain adequate ventilation will improve Outcome: Progressing   Problem: Education: Goal: Knowledge of General Education information will improve Description: Including pain rating scale, medication(s)/side effects and non-pharmacologic comfort measures Outcome: Progressing   Problem: Health Behavior/Discharge Planning: Goal: Ability to manage health-related needs will improve Outcome: Progressing   Problem: Clinical Measurements: Goal: Ability to maintain clinical measurements within normal limits will improve Outcome: Progressing Goal: Will remain free from infection Outcome: Progressing Goal: Diagnostic test results will improve Outcome: Progressing Goal: Respiratory complications will improve Outcome: Progressing Goal: Cardiovascular complication will be avoided Outcome: Progressing   Problem: Activity: Goal: Risk for activity intolerance will decrease Outcome: Progressing   Problem: Nutrition: Goal: Adequate nutrition will be maintained Outcome: Progressing   Problem: Coping: Goal: Level of anxiety will decrease Outcome: Progressing   Problem: Elimination: Goal: Will not experience complications related to bowel motility Outcome: Progressing Goal: Will not experience complications related to urinary retention Outcome: Progressing   Problem: Pain Managment: Goal: General experience of comfort will improve Outcome: Progressing   Problem: Safety: Goal: Ability to remain free from injury will improve Outcome: Progressing   Problem: Skin Integrity: Goal: Risk for impaired skin integrity will decrease Outcome: Progressing

## 2022-10-10 NOTE — Discharge Instructions (Signed)
Use your inhaler, oxygen, nebulizer as before SMOKING CESSATION ADVISED

## 2022-10-10 NOTE — Progress Notes (Signed)
Patient discharged home, all belongings sent home and IV removed. All discharge education provided and questions answered.

## 2022-10-24 ENCOUNTER — Other Ambulatory Visit: Payer: Self-pay | Admitting: Nurse Practitioner

## 2022-10-28 ENCOUNTER — Other Ambulatory Visit: Payer: Self-pay | Admitting: Nurse Practitioner

## 2022-12-25 ENCOUNTER — Emergency Department: Payer: 59

## 2022-12-25 ENCOUNTER — Emergency Department
Admission: EM | Admit: 2022-12-25 | Discharge: 2022-12-25 | Disposition: A | Discharge status: Home / Self Care | Payer: 59 | Attending: Emergency Medicine | Admitting: Emergency Medicine

## 2022-12-25 ENCOUNTER — Other Ambulatory Visit: Payer: Self-pay

## 2022-12-25 ENCOUNTER — Encounter: Payer: Self-pay | Admitting: Emergency Medicine

## 2022-12-25 DIAGNOSIS — I110 Hypertensive heart disease with heart failure: Secondary | ICD-10-CM | POA: Insufficient documentation

## 2022-12-25 DIAGNOSIS — R0602 Shortness of breath: Secondary | ICD-10-CM | POA: Diagnosis not present

## 2022-12-25 DIAGNOSIS — E119 Type 2 diabetes mellitus without complications: Secondary | ICD-10-CM | POA: Insufficient documentation

## 2022-12-25 DIAGNOSIS — E039 Hypothyroidism, unspecified: Secondary | ICD-10-CM | POA: Insufficient documentation

## 2022-12-25 DIAGNOSIS — R197 Diarrhea, unspecified: Secondary | ICD-10-CM | POA: Insufficient documentation

## 2022-12-25 DIAGNOSIS — J45909 Unspecified asthma, uncomplicated: Secondary | ICD-10-CM | POA: Diagnosis not present

## 2022-12-25 DIAGNOSIS — R109 Unspecified abdominal pain: Secondary | ICD-10-CM | POA: Diagnosis not present

## 2022-12-25 DIAGNOSIS — M2550 Pain in unspecified joint: Secondary | ICD-10-CM | POA: Diagnosis not present

## 2022-12-25 DIAGNOSIS — I5030 Unspecified diastolic (congestive) heart failure: Secondary | ICD-10-CM | POA: Insufficient documentation

## 2022-12-25 DIAGNOSIS — R1110 Vomiting, unspecified: Secondary | ICD-10-CM | POA: Diagnosis not present

## 2022-12-25 DIAGNOSIS — J449 Chronic obstructive pulmonary disease, unspecified: Secondary | ICD-10-CM | POA: Insufficient documentation

## 2022-12-25 DIAGNOSIS — Z20822 Contact with and (suspected) exposure to covid-19: Secondary | ICD-10-CM | POA: Insufficient documentation

## 2022-12-25 DIAGNOSIS — R079 Chest pain, unspecified: Secondary | ICD-10-CM | POA: Insufficient documentation

## 2022-12-25 LAB — CBC
HCT: 44.6 % (ref 36.0–46.0)
Hemoglobin: 14.5 g/dL (ref 12.0–15.0)
MCH: 30.7 pg (ref 26.0–34.0)
MCHC: 32.5 g/dL (ref 30.0–36.0)
MCV: 94.3 fL (ref 80.0–100.0)
Platelets: 320 10*3/uL (ref 150–400)
RBC: 4.73 MIL/uL (ref 3.87–5.11)
RDW: 14.2 % (ref 11.5–15.5)
WBC: 13.7 10*3/uL — ABNORMAL HIGH (ref 4.0–10.5)
nRBC: 0 % (ref 0.0–0.2)

## 2022-12-25 LAB — BASIC METABOLIC PANEL
Anion gap: 9 (ref 5–15)
BUN: 16 mg/dL (ref 6–20)
CO2: 25 mmol/L (ref 22–32)
Calcium: 8.9 mg/dL (ref 8.9–10.3)
Chloride: 108 mmol/L (ref 98–111)
Creatinine, Ser: 0.37 mg/dL — ABNORMAL LOW (ref 0.44–1.00)
GFR, Estimated: >60 mL/min (ref >60)
Glucose, Bld: 104 mg/dL — ABNORMAL HIGH (ref 70–99)
Potassium: 2.8 mmol/L — ABNORMAL LOW (ref 3.5–5.1)
Sodium: 142 mmol/L (ref 135–145)

## 2022-12-25 LAB — RESP PANEL BY RT-PCR (RSV, FLU A&B, COVID)  RVPGX2
Influenza A by PCR: NEGATIVE
Influenza B by PCR: NEGATIVE
Resp Syncytial Virus by PCR: NEGATIVE
SARS Coronavirus 2 by RT PCR: NEGATIVE

## 2022-12-25 LAB — TROPONIN I (HIGH SENSITIVITY): Troponin I (High Sensitivity): 4 ng/L (ref <18)

## 2022-12-25 LAB — D-DIMER, QUANTITATIVE: D-Dimer, Quant: 3.22 ug/mL-FEU — ABNORMAL HIGH (ref 0.00–0.50)

## 2022-12-25 MED ORDER — OXYCODONE-ACETAMINOPHEN 5-325 MG PO TABS: 1.0000 | ORAL_TABLET | ORAL | 0 refills | Status: DC | PRN

## 2022-12-25 MED ORDER — LACTATED RINGERS IV BOLUS
1000.0000 mL | Freq: Once | INTRAVENOUS | Status: AC
  Administered 2022-12-25: 1000 mL via INTRAVENOUS

## 2022-12-25 MED ORDER — ONDANSETRON 4 MG PO TBDP
4.0000 mg | ORAL_TABLET | Freq: Once | ORAL | Status: AC
  Administered 2022-12-25: 4 mg via ORAL
  Filled 2022-12-25: qty 1

## 2022-12-25 MED ORDER — ONDANSETRON 4 MG PO TBDP: 4.0000 mg | ORAL_TABLET | Freq: Three times a day (TID) | ORAL | 0 refills | Status: DC | PRN

## 2022-12-25 MED ORDER — ONDANSETRON HCL 4 MG/2ML IJ SOLN
4.0000 mg | Freq: Once | INTRAMUSCULAR | Status: AC
  Administered 2022-12-25: 4 mg via INTRAVENOUS
  Filled 2022-12-25: qty 2

## 2022-12-25 MED ORDER — OXYCODONE-ACETAMINOPHEN 5-325 MG PO TABS
1.0000 | ORAL_TABLET | Freq: Once | ORAL | Status: AC
  Administered 2022-12-25: 1 via ORAL
  Filled 2022-12-25: qty 1

## 2022-12-25 MED ORDER — IOHEXOL 350 MG/ML SOLN
75.0000 mL | Freq: Once | INTRAVENOUS | Status: AC | PRN
  Administered 2022-12-25: 75 mL via INTRAVENOUS

## 2022-12-25 MED ORDER — POTASSIUM CHLORIDE CRYS ER 20 MEQ PO TBCR
40.0000 meq | EXTENDED_RELEASE_TABLET | Freq: Once | ORAL | Status: AC
  Administered 2022-12-25: 40 meq via ORAL
  Filled 2022-12-25: qty 2

## 2022-12-25 MED ORDER — MORPHINE SULFATE (PF) 4 MG/ML IV SOLN: 4.0000 mg | Freq: Once | INTRAVENOUS | Status: DC

## 2022-12-25 MED ORDER — MORPHINE SULFATE (PF) 4 MG/ML IV SOLN
4.0000 mg | Freq: Once | INTRAVENOUS | Status: AC
  Administered 2022-12-25: 4 mg via INTRAVENOUS
  Filled 2022-12-25: qty 1

## 2022-12-25 NOTE — ED Notes (Addendum)
Pt ambulated to room, attempted to put on monitor and she states "do not put that suff on me".  Pt also states "my chest is tight".  Informed her the necessity of having monitoring to ensure her oxygen levels and rate rhythm were wnl.  Chux placed under pt, monitor attached. Meds and fluids given as ordered and pt lying on stretcher with equal chest rise and fall, vss.

## 2022-12-25 NOTE — ED Provider Notes (Signed)
Patient care assumed from Dr. Charna Archer.  Patient had an elevated D-dimer and his CT scans were pending.  CTA of the chest negative for PE or other acute process. CT scan of the abdomen is negative for acute abnormality.  Patient's reassuring workup I believe the patient will be safe for discharge home.  Dr. Charna Archer has prescribed a short course of pain medication for the patient.   Harvest Dark, MD 12/25/22 [phone]

## 2022-12-25 NOTE — ED Notes (Signed)
Pt given wipes to clean herself and clean gown.

## 2022-12-25 NOTE — ED Notes (Signed)
Pt wheeled to exit with all belongings and discharge papers by son.

## 2022-12-25 NOTE — Progress Notes (Signed)
Attempted USGPIV insertion in LFA.  Pt jerked arm away during insertion and began to yell "take it out, take it out".  Pt refused 2nd attempt with [place] and no other veins were found for attempt without [place].  Primary RN at bedside and aware.

## 2022-12-25 NOTE — ED Notes (Signed)
Pt ambulated to restroom in hallway, having emesis and diarrhea- MD notified.

## 2022-12-25 NOTE — ED Provider Notes (Signed)
[facility] Provider Note    Event Date/Time   First MD Initiated Contact with Patient 12/25/22 1025     (approximate)   History   Chief Complaint Chest Pain   HPI  Catherine Giles is a 52 y.o. female with past medical history of hypertension, diabetes, COPD, asthma, diastolic CHF, rheumatoid arthritis, lupus, hypothyroidism, and fibromyalgia who presents to the ED complaining of chest pain.  Patient reports that she received a Dupixent injection for her asthma 4 days ago, has been feeling increasingly ill since then.  She reports sharp pain in the center of her chest that is worse when she takes a deep breath along with a feeling of shortness of breath.  She denies any associated fevers or cough, has been dealing with increasing pain in many of her joints.  She reports severe pain in her shoulders, wrists, and knees bilaterally.  She has noticed a small amount of swelling around her right wrist, but denies any redness of her skin in this area and has not noticed any swelling of other joints.  She reports dealing with joint pain in the past, but never as severe as today.  This was the first time she had received Dupixent.     Physical Exam   Triage Vital Signs: ED Triage Vitals  Enc Vitals Group     BP 12/25/22 1022 (!) 171/106     Pulse Rate 12/25/22 1022 93     Resp 12/25/22 1022 18     Temp 12/25/22 1022 97.6 F (36.4 C)     Temp Source 12/25/22 1022 Oral     SpO2 12/25/22 1022 96 %     Weight 12/25/22 1021 167 lb (75.8 kg)     Height 12/25/22 1021 '5\' 3"'$  (1.6 m)     Head Circumference --      Peak Flow --      Pain Score 12/25/22 1020 10     Pain Loc --      Pain Edu? --      Excl. in [place]? --     Most recent vital signs: Vitals:   12/25/22 1230 12/25/22 1300  BP: (!) 144/85 (!) 145/78  Pulse: 91 84  Resp: 16 18  Temp:    SpO2: 97% 93%    Constitutional: Alert and oriented. Eyes: Conjunctivae are normal. Head: Atraumatic. Nose: No  congestion/rhinnorhea. Mouth/Throat: Mucous membranes are moist.  Cardiovascular: Normal rate, regular rhythm. Grossly normal heart sounds.  2+ radial and DP pulses bilaterally. Respiratory: Normal respiratory effort.  No retractions. Lungs CTAB.  Anterior chest wall tenderness to palpation noted. Gastrointestinal: Soft and nontender. No distention. Musculoskeletal: No lower extremity tenderness nor edema.  Tenderness to palpation at joints diffusely with no erythema, edema, or warmth.  Range of motion intact with minimal pain to joints of upper and lower extremities bilaterally. Neurologic:  Normal speech and language. No gross focal neurologic deficits are appreciated.    ED Results / Procedures / Treatments   Labs (all labs ordered are listed, but only abnormal results are displayed) Labs Reviewed  BASIC METABOLIC PANEL - Abnormal; Notable for the following components:      Result Value   Potassium 2.8 (*)    Glucose, Bld 104 (*)    Creatinine, Ser 0.37 (*)    All other components within normal limits  CBC - Abnormal; Notable for the following components:   WBC 13.7 (*)    All other components within normal limits  D-DIMER, QUANTITATIVE - Abnormal; Notable for the following components:   D-Dimer, Quant 3.22 (*)    All other components within normal limits  RESP PANEL BY RT-PCR (RSV, FLU A&B, COVID)  [id number]  POC URINE PREG, ED  TROPONIN I (HIGH SENSITIVITY)     EKG  ED ECG REPORT I, Blake Divine, the attending physician, personally viewed and interpreted this ECG.   Date: 12/25/2022  EKG Time: 10:27  Rate: 75  Rhythm: normal sinus rhythm  Axis: Normal  Intervals:none  ST&T Change: None  RADIOLOGY Chest x-ray reviewed and interpreted by me with no infiltrate, edema, or effusion.  PROCEDURES:  Critical Care performed: No  Procedures   MEDICATIONS ORDERED IN ED: Medications  morphine (PF) 4 MG/ML injection 4 mg (has no administration in time range)   lactated ringers bolus 1,000 mL (has no administration in time range)  ondansetron (ZOFRAN) injection 4 mg (has no administration in time range)  oxyCODONE-acetaminophen (PERCOCET/ROXICET) 5-325 MG per tablet 1 tablet (1 tablet Oral Given 12/25/22 1219)  ondansetron (ZOFRAN-ODT) disintegrating tablet 4 mg (4 mg Oral Given 12/25/22 1219)  potassium chloride SA (KLOR-CON M) CR tablet 40 mEq (40 mEq Oral Given 12/25/22 1305)  iohexol (OMNIPAQUE) 350 MG/ML injection 75 mL (75 mLs Intravenous Contrast Given 12/25/22 1511)     IMPRESSION / MDM / ASSESSMENT AND PLAN / ED COURSE  I reviewed the triage vital signs and the nursing notes.                              52 y.o. female with past medical history of hypertension, diabetes, COPD, asthma, diastolic CHF, rheumatoid arthritis, lupus, hypothyroidism, and fibromyalgia who presents to the ED with increasing pleuritic chest pain for the past 4 days associated with diffuse joint pain.  Patient's presentation is most consistent with acute presentation with potential threat to life or bodily function.  Differential diagnosis includes, but is not limited to, ACS, PE, dissection, pneumonia, pneumothorax, musculoskeletal pain, chronic arthritis, medication effect, septic arthritis.  Patient uncomfortable appearing but in no acute distress, vital signs remarkable for hypertension but otherwise reassuring.  She complains of diffuse joint pains, no evidence of septic arthritis or cellulitis on exam, vital signs not concerning for sepsis.  EKG shows no evidence of arrhythmia or ischemia and symptoms seem atypical for ACS.  We will check CTA of her chest given shortness of breath and pleuritic chest pain, treat symptomatically with IV morphine.  Chest x-ray is unremarkable.  Dupixent is known to cause joint aches and pains, could be the primary source of her symptoms on top of her baseline rheumatoid arthritis.  Cardiac workup unremarkable, troponin within normal  limits.  D-dimer was elevated but CTA is negative for PE.  Labs show hypokalemia which was repleted, no evidence of AKI or anemia, mild leukocytosis noted but no evidence of infection at this time.  Patient did complain of increasing abdominal pain, CT abdomen/pelvis is negative for acute process.  She is now actively vomiting with diarrhea and symptoms may be due to viral illness, will add on testing for COVID-19 and influenza.  We will give additional dose of IV Zofran, hydrate with IV fluids.  Patient turned over to oncoming divider pending symptomatic treatment and reassessment.      FINAL CLINICAL IMPRESSION(S) / ED DIAGNOSES   Final diagnoses:  Chest pain, unspecified type  Arthralgia, unspecified joint  Vomiting and diarrhea     Rx / DC  Orders   ED Discharge Orders     None        Note:  This document was prepared using Dragon voice recognition software and may include unintentional dictation errors.   Blake Divine, MD 12/25/22 [phone]

## 2022-12-25 NOTE — ED Notes (Signed)
Called lab for assist with blood draw

## 2022-12-25 NOTE — ED Triage Notes (Signed)
Pt in via POV c/o SOB and Chest Pain. Pt had an infusion on Monday and a dupixent shot on Thursday and symptoms started after. Pt is not sure what the infusion she receives is.

## 2022-12-25 NOTE — ED Notes (Signed)
Pt to xray

## 2023-02-12 ENCOUNTER — Other Ambulatory Visit: Payer: Self-pay

## 2023-02-12 ENCOUNTER — Emergency Department: Payer: 59

## 2023-02-12 ENCOUNTER — Emergency Department
Admission: EM | Admit: 2023-02-12 | Discharge: 2023-02-13 | Disposition: A | Discharge status: Home / Self Care | Payer: 59 | Attending: Emergency Medicine | Admitting: Emergency Medicine

## 2023-02-12 DIAGNOSIS — X58XXXA Exposure to other specified factors, initial encounter: Secondary | ICD-10-CM | POA: Insufficient documentation

## 2023-02-12 DIAGNOSIS — W19XXXA Unspecified fall, initial encounter: Secondary | ICD-10-CM

## 2023-02-12 DIAGNOSIS — S3992XA Unspecified injury of lower back, initial encounter: Secondary | ICD-10-CM | POA: Insufficient documentation

## 2023-02-12 DIAGNOSIS — I10 Essential (primary) hypertension: Secondary | ICD-10-CM | POA: Insufficient documentation

## 2023-02-12 DIAGNOSIS — J449 Chronic obstructive pulmonary disease, unspecified: Secondary | ICD-10-CM | POA: Diagnosis not present

## 2023-02-12 DIAGNOSIS — S0990XA Unspecified injury of head, initial encounter: Secondary | ICD-10-CM | POA: Diagnosis not present

## 2023-02-12 DIAGNOSIS — M25551 Pain in right hip: Secondary | ICD-10-CM | POA: Diagnosis present

## 2023-02-12 DIAGNOSIS — E119 Type 2 diabetes mellitus without complications: Secondary | ICD-10-CM | POA: Insufficient documentation

## 2023-02-12 DIAGNOSIS — Z716 Tobacco abuse counseling: Secondary | ICD-10-CM | POA: Diagnosis not present

## 2023-02-12 DIAGNOSIS — W010XXA Fall on same level from slipping, tripping and stumbling without subsequent striking against object, initial encounter: Secondary | ICD-10-CM | POA: Insufficient documentation

## 2023-02-12 MED ORDER — ACETAMINOPHEN 500 MG PO TABS
1000.0000 mg | ORAL_TABLET | Freq: Once | ORAL | Status: AC
  Administered 2023-02-12: 1000 mg via ORAL
  Filled 2023-02-12: qty 2

## 2023-02-12 MED ORDER — DIAZEPAM 2 MG PO TABS
2.0000 mg | ORAL_TABLET | Freq: Once | ORAL | Status: AC
  Administered 2023-02-12: 2 mg via ORAL
  Filled 2023-02-12: qty 1

## 2023-02-12 MED ORDER — OXYCODONE HCL 5 MG PO TABS
5.0000 mg | ORAL_TABLET | Freq: Once | ORAL | Status: AC
  Administered 2023-02-12: 5 mg via ORAL
  Filled 2023-02-12: qty 1

## 2023-02-12 NOTE — ED Triage Notes (Signed)
Pt fell at restaurant over a broom. EMS reports upon their arrival noting outward rotation/shortening of right leg. Pt reports severe pain. 140/60, HR 98, 92% RA, 97.39F, 20Resp. No pain meds given en route.

## 2023-02-12 NOTE — ED Triage Notes (Signed)
Mild shortening/internal rotation of right hip noted by Clinical research associate during triage. Pedal pulses present, sensation intact everywhere. Patient able to move toes. Pt reports significant pain from ankle up to hip.

## 2023-02-12 NOTE — ED Provider Notes (Signed)
[facility] Provider Note    Event Date/Time   First MD Initiated Contact with Patient 02/12/23 2307     (approximate)   History   Hip Injury   HPI  Catherine Giles is a 52 y.o. female   Past medical history of rheumatoid arthritis, COPD, depression, diabetes, fibromyalgia, hypertension, lupus and thyroid disease presents emergency department with a slip and fall over some cleaning equipment onto her right side striking her chin, and injuring her right hip.  She did not lose consciousness and she does not take blood thinners.  She has pain all over  No other acute medical complaints.   External Medical Documents Reviewed: Discharge summary from 10/10/2022 for COPD exacerbation      Physical Exam   Triage Vital Signs: ED Triage Vitals  Enc Vitals Group     BP 02/12/23 2311 138/81     Pulse Rate 02/12/23 2311 91     Resp 02/12/23 2311 20     Temp 02/12/23 2314 (!) 97.5 F (36.4 C)     Temp Source 02/12/23 2314 Oral     SpO2 02/12/23 2311 99 %     Weight 02/12/23 2312 167 lb (75.8 kg)     Height 02/12/23 2312  (1.6 m)     Head Circumference --      Peak Flow --      Pain Score 02/12/23 2309 10     Pain Loc --      Pain Edu? --      Excl. in [place]? --     Most recent vital signs: Vitals:   02/12/23 2330 02/13/23 0030  BP: 139/80   Pulse: 91 93  Resp: 13   Temp:    SpO2: 100% 100%    General: Awake, no distress.  CV:  Good peripheral perfusion.  Resp:  Normal effort.  Abd:  No distention.  Other:  She is able to gently lift at the affected right lower extremity off the bed though complains of pain in the right hip when doing so.  She has tenderness to palpation around the right hip but is neurovascular intact.  She is able to fully range the left lower extremity as well as bilateral upper extremities.  No obvious chin injury but she complains of tenderness when I palpate the back of her neck and her lumbar spine.   ED Results  / Procedures / Treatments   Labs (all labs ordered are listed, but only abnormal results are displayed) Labs Reviewed - No data to display EKG  ED ECG REPORT I, Pilar Jarvis, the attending physician, personally viewed and interpreted this ECG.   Date: 02/12/2023  EKG Time: 2313  Rate: 92  Rhythm: nsr  Axis: nl  Intervals:none  ST&T Change: no stemi    RADIOLOGY I independently reviewed and interpreted CT of the head and see no obvious bleeding or midline shift   PROCEDURES:  Critical Care performed: No  Procedures   MEDICATIONS ORDERED IN ED: Medications  acetaminophen (TYLENOL) tablet 1,000 mg (1,000 mg Oral Given 02/12/23 2332)  oxyCODONE (Oxy IR/ROXICODONE) immediate release tablet 5 mg (5 mg Oral Given 02/12/23 2332)  diazepam (VALIUM) tablet 2 mg (2 mg Oral Given 02/12/23 2332)     IMPRESSION / MDM / ASSESSMENT AND PLAN / ED COURSE  I reviewed the triage vital signs and the nursing notes.  Patient's presentation is most consistent with acute presentation with potential threat to life or bodily function.  Differential diagnosis includes, but is not limited to, slip and fall leading to blunt traumatic injury including intracranial bleeding, cervical spine fracture or dislocation, lumbar spine fracture or dislocation, hip fracture dislocation   The patient is on the cardiac monitor to evaluate for evidence of arrhythmia and/or significant heart rate changes.  MDM: Patient with mechanical slip and fall with mostly right hip pain but also complaining of pain everywhere all of her body and isolated tenderness to palpation to the right lower extremity joints, cervical spine and lumbar spine.  Imaging to assess for acute traumatic injuries.  Medications ordered for pain and muscle relaxation.  No emergent pathologies or traumatic injury noted on imaging.  Patient is ambulatory in room.  Plan for discharge home with pain medications and PMD  follow-up.  I spent 5 minutes counseling this patient on smoking cessation.  We spoke about the patient's current tobacco use, impact of smoking, assessed willingness to quit, methods for cessation including medical management and nicotine replacement therapy (which I prescribed to the patient) and advised follow-up with primary doctor to continue to address smoking cessation.       FINAL CLINICAL IMPRESSION(S) / ED DIAGNOSES   Final diagnoses:  Fall, initial encounter  Injury of low back, initial encounter  Right hip pain  Encounter for smoking cessation counseling     Rx / DC Orders   ED Discharge Orders          Ordered    oxyCODONE (ROXICODONE) 5 MG immediate release tablet  Every 8 hours PRN        02/13/23 0046    nicotine (NICODERM CQ - DOSED IN MG/24 HOURS) 14 mg/24hr patch  Every 24 hours        02/13/23 0046    nicotine polacrilex (NICOTINE MINI) 4 MG lozenge  As needed        02/13/23 0046             Note:  This document was prepared using Dragon voice recognition software and may include unintentional dictation errors.    Pilar Jarvis, MD 02/13/23 [phone]

## 2023-02-13 MED ORDER — NICOTINE POLACRILEX 4 MG MT LOZG: 4.0000 mg | LOZENGE | OROMUCOSAL | 0 refills | Status: DC | PRN

## 2023-02-13 MED ORDER — NICOTINE 14 MG/24HR TD PT24: 14.0000 mg | MEDICATED_PATCH | TRANSDERMAL | 3 refills | Status: DC

## 2023-02-13 MED ORDER — OXYCODONE HCL 5 MG PO TABS: 5.0000 mg | ORAL_TABLET | Freq: Three times a day (TID) | ORAL | 0 refills | Status: DC | PRN

## 2023-02-13 NOTE — Discharge Instructions (Signed)
Take acetaminophen 650 mg and ibuprofen 400 mg every 6 hours for pain.  Take with food. If you have severe/breakthrough pain as we discussed, take oxycodone as prescribed.  See your doctor this week for a follow-up visit

## 2023-02-21 ENCOUNTER — Other Ambulatory Visit
Admission: RE | Admit: 2023-02-21 | Discharge: 2023-02-21 | Disposition: A | Discharge status: Home / Self Care | Payer: 59 | Source: Ambulatory Visit | Attending: Specialist | Admitting: Specialist

## 2023-02-21 DIAGNOSIS — R0602 Shortness of breath: Secondary | ICD-10-CM | POA: Diagnosis present

## 2023-02-21 DIAGNOSIS — R079 Chest pain, unspecified: Secondary | ICD-10-CM | POA: Insufficient documentation

## 2023-02-21 LAB — D-DIMER, QUANTITATIVE: D-Dimer, Quant: 3.53 ug/mL-FEU — ABNORMAL HIGH (ref 0.00–0.50)

## 2023-03-09 ENCOUNTER — Other Ambulatory Visit: Payer: Self-pay | Admitting: Nurse Practitioner

## 2023-03-20 ENCOUNTER — Ambulatory Visit
Admission: EM | Admit: 2023-03-20 | Discharge: 2023-03-20 | Disposition: A | Discharge status: Home / Self Care | Payer: 59 | Attending: Emergency Medicine | Admitting: Emergency Medicine

## 2023-03-20 DIAGNOSIS — J069 Acute upper respiratory infection, unspecified: Secondary | ICD-10-CM | POA: Diagnosis present

## 2023-03-20 DIAGNOSIS — N3000 Acute cystitis without hematuria: Secondary | ICD-10-CM | POA: Diagnosis present

## 2023-03-20 DIAGNOSIS — J4521 Mild intermittent asthma with (acute) exacerbation: Secondary | ICD-10-CM | POA: Diagnosis present

## 2023-03-20 LAB — URINALYSIS, W/ REFLEX TO CULTURE (INFECTION SUSPECTED)
Bilirubin Urine: NEGATIVE
Glucose, UA: NEGATIVE mg/dL
Ketones, ur: NEGATIVE mg/dL
Nitrite: NEGATIVE
Protein, ur: NEGATIVE mg/dL
Specific Gravity, Urine: 1.02 (ref 1.005–1.030)
pH: 5.5 (ref 5.0–8.0)

## 2023-03-20 MED ORDER — IPRATROPIUM BROMIDE 0.06 % NA SOLN: 2.0000 | Freq: Four times a day (QID) | NASAL | 12 refills | Status: DC

## 2023-03-20 MED ORDER — PROMETHAZINE-DM 6.25-15 MG/5ML PO SYRP: 5.0000 mL | ORAL_SOLUTION | Freq: Four times a day (QID) | ORAL | 0 refills | Status: DC | PRN

## 2023-03-20 MED ORDER — PHENAZOPYRIDINE HCL 200 MG PO TABS: 200.0000 mg | ORAL_TABLET | Freq: Three times a day (TID) | ORAL | 0 refills | Status: DC

## 2023-03-20 MED ORDER — BENZONATATE 100 MG PO CAPS: 200.0000 mg | ORAL_CAPSULE | Freq: Three times a day (TID) | ORAL | 0 refills | Status: DC

## 2023-03-20 MED ORDER — PREDNISONE 20 MG PO TABS: 60.0000 mg | ORAL_TABLET | Freq: Every day | ORAL | 0 refills | Status: AC

## 2023-03-20 MED ORDER — NITROFURANTOIN MONOHYD MACRO 100 MG PO CAPS: 100.0000 mg | ORAL_CAPSULE | Freq: Two times a day (BID) | ORAL | 0 refills | Status: DC

## 2023-03-20 NOTE — Discharge Instructions (Addendum)
Take the Macrobid twice daily for 5 days with food for treatment of urinary tract infection.  Use the Pyridium every 8 hours as needed for urinary discomfort.  This will turn your urine a bright red-orange.  Increase your oral fluid intake so that you increase your urine production and or flushing your urinary system.  Take an over-the-counter probiotic, such as Culturelle-Align-Activia, 1 hour after each dose of antibiotic to prevent diarrhea or yeast infections from forming.  We will culture urine and change the antibiotics if necessary.  As we discussed, you also have an upper respiratory infection that I believe is viral and the drainage is what is triggering her cough and exacerbating your asthma.  Use the Atrovent nasal spray, 2 squirts in each nostril every 6 hours, as needed for runny nose and postnasal drip.  Use the Tessalon Perles every 8 hours during the day.  Take them with a small sip of water.  They may give you some numbness to the base of your tongue or a metallic taste in your mouth, this is normal.  Use the Promethazine DM cough syrup at bedtime for cough and congestion.  It will make you drowsy so do not take it during the day.  Continue to use your albuterol nebulizer and inhaler every 4-6 hours as needed for shortness breath and wheezing.  Start the prednisone 60 mg daily today when you pick it up and take it each morning going forward at breakfast time.  You will take this for 5 days to help with pulmonary inflammation.  Return for reevaluation, or see your primary care provider, for any new or worsening symptoms.

## 2023-03-20 NOTE — ED Triage Notes (Signed)
Pt does not want strep test. Denies sore throat.

## 2023-03-20 NOTE — ED Provider Notes (Signed)
[facility]    CSN: [id number] Arrival date & time: 03/20/23  0803      History   Chief Complaint Chief Complaint  Patient presents with   Cough    HPI Rylen Jeriyah Dipaolo is a 52 y.o. female.   HPI  52 year old female with a past medical history significant for asthma, COPD, type 2 diabetes, hypertension, lupus, Hashimoto's.,  And emphysema presents for evaluation of a cough.  She reports that she had COVID in April and the cough resolved but has since returned.  She is also been experiencing headaches and bodyaches for the last 3 days.  Using nebs inhalers with some improvement though more often than every 4 hours.  She also reports some pain with urination but she denies abdominal pain or blood in her urine.  Additionally, she is complaining of red bumps on her left thigh that she noticed this morning and she is concerned that may be spider bites.  Past Medical History:  Diagnosis Date   Arthritis    RA   Asthma    COPD (chronic obstructive pulmonary disease) (HCC)    Depression    Diabetes mellitus without complication (HCC)    Emphysema of lung (HCC)    Family history of breast cancer    6/21 cancer genetic testing letter sent   Fibromyalgia    Hypertension    Lupus (HCC)    Pre-diabetes    Thyroid disease     Patient Active Problem List   Diagnosis Date Noted   Bronchopneumonia 10/09/2022   Influenza A 10/07/2022   Depression with anxiety 10/07/2022   COPD exacerbation (HCC) 10/07/2022   HTN (hypertension) 10/07/2022   Tobacco abuse 10/07/2022   Chest pain 09/10/2022   COPD (chronic obstructive pulmonary disease) (HCC)    Hypothyroidism    Nicotine dependence, cigarettes, uncomplicated    Chronic diastolic CHF (congestive heart failure) (HCC)    Sepsis (HCC)    Allergy to alpha-gal 01/07/2022   Depression, major, single episode, mild (HCC) 01/24/2021   Dairy allergy 10/12/2020   Vaginal stenosis 03/19/2020   Obesity 03/19/2020   Insomnia  01/05/2020   Complex regional pain syndrome I of upper limb 12/17/2019   Hypertension associated with diabetes (HCC) 12/13/2019   Type II diabetes mellitus with complication (HCC) 12/13/2019   Hashimoto's thyroiditis 12/13/2019   DDD (degenerative disc disease), lumbar 08/26/2019   Multinodular goiter 08/14/2019   Chronic left hip pain 05/05/2019   Osteoarthritis of spine with radiculopathy, cervical region 05/05/2019   Polypharmacy 02/14/2019   Fibromyalgia 02/10/2019   Rheumatoid arthritis involving multiple sites with positive rheumatoid factor (HCC) 02/10/2019   Cervical radiculopathy 03/13/2018   Vitamin D deficiency 10/11/2015    Past Surgical History:  Procedure Laterality Date   CHOLECYSTECTOMY, LAPAROSCOPIC  02/2022   MOUTH SURGERY     TRANSTHORACIC ECHOCARDIOGRAM  04/2020   EF 60 to 65%.  Normal LV function.  No R WMA.  GR 1 DD.  Normal RV size and function.  Normal PAP.  Normal atrial size.  Normal valves.    OB History     Gravida  9   Para  6   Term  0   Preterm  0   AB  3   Living         SAB  2   IAB  0   Ectopic  0   Multiple      Live Births  Home Medications    Prior to Admission medications   Medication Sig Start Date End Date Taking? Authorizing Provider  amLODipine (NORVASC) 10 MG tablet Take 1 tablet (10 mg total) by mouth daily. 09/05/21  Yes Cannady, Jolene T, NP  benzonatate (TESSALON) 100 MG capsule Take 2 capsules (200 mg total) by mouth every 8 (eight) hours. 03/20/23  Yes Becky Augusta, NP  Blood Glucose Monitoring Suppl (ONETOUCH VERIO) w/Device KIT Use to check blood sugar 3 times daily with goal <130 fasting and <180 two hours after meal.  Bring meter to visits. 01/25/21  Yes Cannady, Jolene T, NP  cetirizine (ZYRTEC) 10 MG tablet Take 1 tablet (10 mg total) by mouth daily. 01/03/22  Yes Gabriel Cirri, NP  dapagliflozin propanediol (FARXIGA) 10 MG TABS tablet Take 1 tablet (10 mg total) by mouth daily before  breakfast. 03/16/21  Yes Cannady, Jolene T, NP  EPINEPHrine 0.3 mg/0.3 mL IJ SOAJ injection Inject 0.3 mg into the muscle as needed for anaphylaxis. 02/15/22  Yes Cannady, Jolene T, NP  golimumab (SIMPONI ARIA) 50 MG/4ML SOLN injection Inject into the vein.   Yes [provider]  hydrochlorothiazide (HYDRODIURIL) 25 MG tablet Take 25 mg by mouth daily. 01/22/23  Yes [provider]  hydrOXYzine (VISTARIL) 25 MG capsule TAKE 1 CAPSULE BY MOUTH EVERY 8 HOURS ASNEEDED 01/03/22  Yes Gabriel Cirri, NP  ibuprofen (ADVIL) 600 MG tablet Take 1 tablet (600 mg total) by mouth every 8 (eight) hours as needed. 10/10/22  Yes Enedina Finner, MD  ipratropium (ATROVENT) 0.06 % nasal spray Place 2 sprays into both nostrils 4 (four) times daily. 03/20/23  Yes Becky Augusta, NP  ipratropium-albuterol (DUONEB) 0.5-2.5 (3) MG/3ML SOLN Take 3 mLs by nebulization every 6 (six) hours as needed for up to 7 days. 10/30/21 03/20/23 Yes Eusebio Friendly B, PA-C  Lancets [facility] ULTRASOFT) lancets Use to check blood sugar 3 times daily with goal <130 fasting and <180 two hours after meal. 01/25/21  Yes Cannady, Jolene T, NP  leflunomide (ARAVA) 20 MG tablet Take 20 mg by mouth daily. 01/22/21  Yes [provider]  levothyroxine (SYNTHROID) 50 MCG tablet Take 50 mcg by mouth daily before breakfast.    Yes [provider]  losartan (COZAAR) 100 MG tablet Take 100 mg by mouth daily.   Yes [provider]  montelukast (SINGULAIR) 10 MG tablet Take 10 mg by mouth daily. 02/15/22  Yes [provider]  nitrofurantoin, macrocrystal-monohydrate, (MACROBID) 100 MG capsule Take 1 capsule (100 mg total) by mouth 2 (two) times daily. 03/20/23  Yes Becky Augusta, NP  ondansetron (ZOFRAN-ODT) 4 MG disintegrating tablet Take 1 tablet (4 mg total) by mouth every 8 (eight) hours as needed for nausea or vomiting. 12/25/22  Yes Minna Antis, MD  phenazopyridine (PYRIDIUM) 200 MG tablet Take 1 tablet (200 mg  total) by mouth 3 (three) times daily. 03/20/23  Yes Becky Augusta, NP  predniSONE (DELTASONE) 20 MG tablet Take 3 tablets (60 mg total) by mouth daily with breakfast for 5 days. 3 tablets daily for 5 days. 03/20/23 03/25/23 Yes Becky Augusta, NP  promethazine-dextromethorphan (PROMETHAZINE-DM) 6.25-15 MG/5ML syrup Take 5 mLs by mouth 4 (four) times daily as needed. 03/20/23  Yes Becky Augusta, NP  TRELEGY Salomon Mast 200-62.5-25 MCG/ACT AEPB Inhale 1 puff into the lungs daily. 09/11/22  Yes [provider]  Acidophilus Lactobacillus CAPS Take 1 tablet by mouth daily.    [provider]  glucose blood test strip Use to check blood sugar  3 times daily with goal <130 fasting and <180 two hours after meal. 01/25/21   Cannady, Jolene T, NP  nicotine polacrilex (NICOTINE MINI) 4 MG lozenge Take 1 lozenge (4 mg total) by mouth as needed for smoking cessation. 02/13/23   Pilar Jarvis, MD  polyethylene glycol (MIRALAX) 17 g packet Take 17 g by mouth daily. 03/15/22   Wouk, Wilfred Curtis, MD  traZODone (DESYREL) 50 MG tablet Take 1 tablet (50 mg total) by mouth at bedtime. 01/05/20 07/01/20  Marjie Skiff, NP    Family History Family History  Problem Relation Age of Onset   Lung cancer Mother    Diabetes Mother    Breast cancer Mother 67   Hypertension Father    Cancer Maternal Aunt    Ovarian cancer Maternal Aunt    Diabetes Maternal Aunt    Diabetes Maternal Uncle    Hypertension Maternal Grandmother    Diabetes Maternal Grandmother     Social History Social History   Tobacco Use   Smoking status: Every Day    Packs/day: 1.00    Years: 34.00    Additional pack years: 0.00    Total pack years: 34.00    Types: Cigarettes   Smokeless tobacco: Never   Tobacco comments:    2-3 cig/day  Vaping Use   Vaping Use: Never used  Substance Use Topics   Alcohol use: Yes    Comment: Occ   Drug use: Not Currently     Allergies   Azithromycin, Molds & smuts, Alpha-gal, Penicillins, and  Shellfish allergy   Review of Systems Review of Systems  Constitutional:  Negative for fever.  HENT:  Positive for congestion and rhinorrhea. Negative for ear pain and sore throat.   Respiratory:  Positive for cough, shortness of breath and wheezing.        Productive for clear sputum  Gastrointestinal:  Positive for diarrhea. Negative for nausea and vomiting.       Loose stools  Genitourinary:  Positive for dysuria, frequency and urgency. Negative for hematuria.  Skin:  Positive for rash.     Physical Exam Triage Vital Signs ED Triage Vitals  Enc Vitals Group     BP      Pulse      Resp      Temp      Temp src      SpO2      Weight      Height      Head Circumference      Peak Flow      Pain Score      Pain Loc      Pain Edu?      Excl. in GC?    No data found.  Updated Vital Signs BP 132/79 (BP Location: Left Arm)   Pulse (!) 105   Temp 98.6 F (37 C) (Oral)   Resp 18   SpO2 98%   Visual Acuity Right Eye Distance:   Left Eye Distance:   Bilateral Distance:    Right Eye Near:   Left Eye Near:    Bilateral Near:     Physical Exam Vitals and nursing note reviewed.  Constitutional:      Appearance: Normal appearance. She is not ill-appearing.  HENT:     Head: Normocephalic and atraumatic.     Right Ear: Ear canal and external ear normal. There is no impacted cerumen.     Left Ear: Tympanic membrane, ear canal and external ear normal. There  is no impacted cerumen.     Ears:     Comments: Mild erythema to the right tympanic membrane without bulging or injection.  No effusions noted.     Nose: Congestion and rhinorrhea present.     Comments: Nasal mucosa is erythematous and edematous with clear discharge both nares    Mouth/Throat:     Mouth: Mucous membranes are moist.     Pharynx: Oropharynx is clear. No oropharyngeal exudate or posterior oropharyngeal erythema.  Neck:     Comments: Bilateral anterior cervical lymphadenopathy present on  exam. Cardiovascular:     Rate and Rhythm: Normal rate and regular rhythm.     Pulses: Normal pulses.     Heart sounds: Normal heart sounds. No murmur heard.    No friction rub. No gallop.  Pulmonary:     Effort: Pulmonary effort is normal.     Breath sounds: Wheezing present. No rhonchi or rales.     Comments: Patient has diffuse wheezing in all lung fields Musculoskeletal:     Cervical back: Normal range of motion and neck supple.  Lymphadenopathy:     Cervical: Cervical adenopathy present.  Skin:    General: Skin is warm and dry.     Capillary Refill: Capillary refill takes less than 2 seconds.     Findings: Erythema and rash present.     Comments: Macular erythematous rash left thigh.  No vesicles or papules.  Neurological:     General: No focal deficit present.     Mental Status: She is alert and oriented to person, place, and time.      UC Treatments / Results  Labs (all labs ordered are listed, but only abnormal results are displayed) Labs Reviewed  URINALYSIS, W/ REFLEX TO CULTURE (INFECTION SUSPECTED) - Abnormal; Notable for the following components:      Result Value   Hgb urine dipstick TRACE (*)    Leukocytes,Ua TRACE (*)    Bacteria, UA FEW (*)    All other components within normal limits  URINE CULTURE    EKG   Radiology No results found.  Procedures Procedures (including critical care time)  Medications Ordered in UC Medications - No data to display  Initial Impression / Assessment and Plan / UC Course  I have reviewed the triage vital signs and the nursing notes.  Pertinent labs & imaging results that were available during my care of the patient were reviewed by me and considered in my medical decision making (see chart for details).   Patient is a nontoxic-appearing 52 year old female presenting for evaluation of respiratory, integumentary, and GU symptoms as outlined HPI above.  The patient states she feels like she has COVID though she was  positive for COVID and April.  I advised her that she cannot have COVID again as she has antibodies that protect her against infection for at least 6 months and she only had COVID 1 month ago.  Her upper respiratory exam does reveal inflamed nasal mucosa with clear rhinorrhea and clear postnasal drip and I suspect that she has a viral upper respiratory infection that has exacerbated her asthma given her diffuse wheezing when lung sounds are auscultated.  She is able to speak in full sentence without dyspnea or tachypnea.  Normal chest excursion.  She is also endorsing urinary urgency, frequency, and burning so I will order urinalysis to look for the presence of urinary tract infection.  The patient's third complaint is a rash on her left thigh that she noticed  this morning.  The patient describes the rash as being itchy but denies any burning or pain.  There are no vesicles noted.  It is macular but not papular and it is not warm to touch.  The rash is blanchable.  I suspect that this may be a viral exanthem related to her URI.  Urinalysis shows trace hemoglobin with trace leukocyte esterase but is negative for nitrites or protein.  Reflex microscopy shows 6-10 WBCs, 6-10 RBCs, few bacteria, and WBC clumps.  I will order a urine culture.  I will discharge patient on the diagnosis of urinary tract infection and I will treat her with Macrobid 100 mg twice daily for 5 days.  Also Pyridium to help with urinary discomfort.  Additionally she has an upper respiratory infection which I believe is exacerbating her asthma.  I will start her on prednisone 60 mg daily for 5 days for help with pulmonary inflammation and have her continue her albuterol inhaler.  I will prescribe Atrovent nasal spray to help with nasal congestion and postnasal drip along with Tessalon Perles and Promethazine DM cough syrup for cough and congestion.  Final Clinical Impressions(s) / UC Diagnoses   Final diagnoses:  Upper respiratory tract  infection, unspecified type  Mild intermittent asthma with exacerbation  Acute cystitis without hematuria     Discharge Instructions      Take the Macrobid twice daily for 5 days with food for treatment of urinary tract infection.  Use the Pyridium every 8 hours as needed for urinary discomfort.  This will turn your urine a bright red-orange.  Increase your oral fluid intake so that you increase your urine production and or flushing your urinary system.  Take an over-the-counter probiotic, such as Culturelle-Align-Activia, 1 hour after each dose of antibiotic to prevent diarrhea or yeast infections from forming.  We will culture urine and change the antibiotics if necessary.  As we discussed, you also have an upper respiratory infection that I believe is viral and the drainage is what is triggering her cough and exacerbating your asthma.  Use the Atrovent nasal spray, 2 squirts in each nostril every 6 hours, as needed for runny nose and postnasal drip.  Use the Tessalon Perles every 8 hours during the day.  Take them with a small sip of water.  They may give you some numbness to the base of your tongue or a metallic taste in your mouth, this is normal.  Use the Promethazine DM cough syrup at bedtime for cough and congestion.  It will make you drowsy so do not take it during the day.  Continue to use your albuterol nebulizer and inhaler every 4-6 hours as needed for shortness breath and wheezing.  Start the prednisone 60 mg daily today when you pick it up and take it each morning going forward at breakfast time.  You will take this for 5 days to help with pulmonary inflammation.  Return for reevaluation, or see your primary care provider, for any new or worsening symptoms.      ED Prescriptions     Medication Sig Dispense Auth. Provider   nitrofurantoin, macrocrystal-monohydrate, (MACROBID) 100 MG capsule Take 1 capsule (100 mg total) by mouth 2 (two) times daily. 10 capsule  Becky Augusta, NP   phenazopyridine (PYRIDIUM) 200 MG tablet Take 1 tablet (200 mg total) by mouth 3 (three) times daily. 6 tablet Becky Augusta, NP   benzonatate (TESSALON) 100 MG capsule Take 2 capsules (200 mg total) by mouth every 8 (  eight) hours. 21 capsule Becky Augusta, NP   ipratropium (ATROVENT) 0.06 % nasal spray Place 2 sprays into both nostrils 4 (four) times daily. 15 mL Becky Augusta, NP   promethazine-dextromethorphan (PROMETHAZINE-DM) 6.25-15 MG/5ML syrup Take 5 mLs by mouth 4 (four) times daily as needed. 118 mL Becky Augusta, NP   predniSONE (DELTASONE) 20 MG tablet Take 3 tablets (60 mg total) by mouth daily with breakfast for 5 days. 3 tablets daily for 5 days. 15 tablet Becky Augusta, NP      PDMP not reviewed this encounter.   Becky Augusta, NP 03/20/23 1045

## 2023-03-20 NOTE — ED Triage Notes (Signed)
Pt presents with cough. Had COVID in April. Cough as gone but has returned. Also reports body aches, HA x 3 days.  Using nebs and inhalers with improvement. Using more often than usually (q4h).   Pt also reports dysuria x 3 days.  Denies abd pain, hematuria, etc.   Pt also reports multiple red bumps on left thigh first noticed this morning. Believes it is spider bites bc there are spiders in the house.

## 2023-03-21 ENCOUNTER — Emergency Department: Payer: 59

## 2023-03-21 ENCOUNTER — Encounter: Payer: Self-pay | Admitting: Emergency Medicine

## 2023-03-21 ENCOUNTER — Emergency Department
Admission: EM | Admit: 2023-03-21 | Discharge: 2023-03-21 | Disposition: A | Discharge status: Home / Self Care | Payer: 59 | Attending: Emergency Medicine | Admitting: Emergency Medicine

## 2023-03-21 ENCOUNTER — Other Ambulatory Visit: Payer: Self-pay

## 2023-03-21 DIAGNOSIS — J441 Chronic obstructive pulmonary disease with (acute) exacerbation: Secondary | ICD-10-CM | POA: Insufficient documentation

## 2023-03-21 DIAGNOSIS — E119 Type 2 diabetes mellitus without complications: Secondary | ICD-10-CM | POA: Insufficient documentation

## 2023-03-21 DIAGNOSIS — I10 Essential (primary) hypertension: Secondary | ICD-10-CM | POA: Diagnosis not present

## 2023-03-21 DIAGNOSIS — J45909 Unspecified asthma, uncomplicated: Secondary | ICD-10-CM | POA: Insufficient documentation

## 2023-03-21 DIAGNOSIS — R0602 Shortness of breath: Secondary | ICD-10-CM | POA: Diagnosis present

## 2023-03-21 DIAGNOSIS — R079 Chest pain, unspecified: Secondary | ICD-10-CM

## 2023-03-21 LAB — COMPREHENSIVE METABOLIC PANEL
ALT: 19 U/L (ref 0–44)
AST: 30 U/L (ref 15–41)
Albumin: 3.7 g/dL (ref 3.5–5.0)
Alkaline Phosphatase: 69 U/L (ref 38–126)
Anion gap: 11 (ref 5–15)
BUN: 16 mg/dL (ref 6–20)
CO2: 23 mmol/L (ref 22–32)
Calcium: 9.7 mg/dL (ref 8.9–10.3)
Chloride: 107 mmol/L (ref 98–111)
Creatinine, Ser: 0.69 mg/dL (ref 0.44–1.00)
GFR, Estimated: >60 mL/min (ref >60)
Glucose, Bld: 297 mg/dL — ABNORMAL HIGH (ref 70–99)
Potassium: 3.6 mmol/L (ref 3.5–5.1)
Sodium: 141 mmol/L (ref 135–145)
Total Bilirubin: <0.1 mg/dL — ABNORMAL LOW (ref 0.3–1.2)
Total Protein: 7.9 g/dL (ref 6.5–8.1)

## 2023-03-21 LAB — CBC WITH DIFFERENTIAL/PLATELET
Abs Immature Granulocytes: 0.06 10*3/uL (ref 0.00–0.07)
Basophils Absolute: 0 10*3/uL (ref 0.0–0.1)
Basophils Relative: 0 %
Eosinophils Absolute: 0 10*3/uL (ref 0.0–0.5)
Eosinophils Relative: 0 %
HCT: 44.9 % (ref 36.0–46.0)
Hemoglobin: 14.5 g/dL (ref 12.0–15.0)
Immature Granulocytes: 1 %
Lymphocytes Relative: 18 %
Lymphs Abs: 1.9 10*3/uL (ref 0.7–4.0)
MCH: 31.3 pg (ref 26.0–34.0)
MCHC: 32.3 g/dL (ref 30.0–36.0)
MCV: 96.8 fL (ref 80.0–100.0)
Monocytes Absolute: 0.4 10*3/uL (ref 0.1–1.0)
Monocytes Relative: 3 %
Neutro Abs: 8.4 10*3/uL — ABNORMAL HIGH (ref 1.7–7.7)
Neutrophils Relative %: 78 %
Platelets: 349 10*3/uL (ref 150–400)
RBC: 4.64 MIL/uL (ref 3.87–5.11)
RDW: 14.3 % (ref 11.5–15.5)
WBC: 10.7 10*3/uL — ABNORMAL HIGH (ref 4.0–10.5)
nRBC: 0 % (ref 0.0–0.2)

## 2023-03-21 LAB — URINE CULTURE

## 2023-03-21 LAB — LIPASE, BLOOD: Lipase: 32 U/L (ref 11–51)

## 2023-03-21 LAB — TROPONIN I (HIGH SENSITIVITY)
Troponin I (High Sensitivity): 3 ng/L (ref <18)
Troponin I (High Sensitivity): 3 ng/L (ref <18)

## 2023-03-21 MED ORDER — IPRATROPIUM-ALBUTEROL 0.5-2.5 (3) MG/3ML IN SOLN
6.0000 mL | Freq: Once | RESPIRATORY_TRACT | Status: AC
  Administered 2023-03-21: 6 mL via RESPIRATORY_TRACT
  Filled 2023-03-21: qty 3

## 2023-03-21 MED ORDER — KETOROLAC TROMETHAMINE 15 MG/ML IJ SOLN
15.0000 mg | Freq: Once | INTRAMUSCULAR | Status: AC
  Administered 2023-03-21: 15 mg via INTRAVENOUS
  Filled 2023-03-21: qty 1

## 2023-03-21 MED ORDER — DOXYCYCLINE HYCLATE 100 MG PO TABS
100.0000 mg | ORAL_TABLET | Freq: Once | ORAL | Status: AC
  Administered 2023-03-21: 100 mg via ORAL
  Filled 2023-03-21: qty 1

## 2023-03-21 MED ORDER — ONETOUCH ULTRASOFT LANCETS MISC: 5 refills | Status: AC

## 2023-03-21 MED ORDER — IPRATROPIUM-ALBUTEROL 0.5-2.5 (3) MG/3ML IN SOLN
RESPIRATORY_TRACT | Status: AC
  Filled 2023-03-21: qty 3

## 2023-03-21 MED ORDER — MORPHINE SULFATE (PF) 4 MG/ML IV SOLN
4.0000 mg | Freq: Once | INTRAVENOUS | Status: AC
  Administered 2023-03-21: 4 mg via INTRAVENOUS
  Filled 2023-03-21: qty 1

## 2023-03-21 MED ORDER — OXYCODONE HCL 5 MG PO TABS: 5.0000 mg | ORAL_TABLET | Freq: Four times a day (QID) | ORAL | 0 refills | Status: DC | PRN

## 2023-03-21 MED ORDER — IOHEXOL 350 MG/ML SOLN
75.0000 mL | Freq: Once | INTRAVENOUS | Status: AC | PRN
  Administered 2023-03-21: 75 mL via INTRAVENOUS

## 2023-03-21 MED ORDER — ONDANSETRON HCL 4 MG/2ML IJ SOLN
4.0000 mg | Freq: Once | INTRAMUSCULAR | Status: AC
  Administered 2023-03-21: 4 mg via INTRAVENOUS
  Filled 2023-03-21: qty 2

## 2023-03-21 MED ORDER — DOXYCYCLINE HYCLATE 50 MG PO CAPS: 100.0000 mg | ORAL_CAPSULE | Freq: Two times a day (BID) | ORAL | 0 refills | Status: AC

## 2023-03-21 NOTE — ED Notes (Signed)
Lights dimmed, pt. Provided warm blankets, resting in stretcher, eyes closed, NAD.

## 2023-03-21 NOTE — Discharge Instructions (Addendum)
Take acetaminophen 650 mg and ibuprofen 400 mg every 6 hours for pain.  Take with food. Use oxycodone only for severe/breakthrough pain as prescribed  Take doxycycline for the full course as prescribed.  Follow-up with your doctor this week for a follow-up appointment and tell them about your chest wall pain if it continues to hurt despite the above medications to get further testing  Fortunately your testing in the emergency department did not show any signs of dangerous heart or lung conditions like punctured lung, blood clot, or heart attack. If you have any new, worsening, or unexpected symptoms come back to the emergency department for reevaluation.

## 2023-03-21 NOTE — ED Notes (Signed)
Pt. Up to bathroom indep. Gait steady, NAD. 

## 2023-03-21 NOTE — ED Notes (Signed)
ED Provider at bedside. 

## 2023-03-21 NOTE — ED Notes (Signed)
Pt in CT.

## 2023-03-21 NOTE — ED Provider Notes (Signed)
[facility] Provider Note    Event Date/Time   First MD Initiated Contact with Patient 03/21/23 [phone]     (approximate)   History   Chest Pain   HPI  Catherine Giles is a 52 y.o. female   Past medical history of COPD, asthma, diabetes, fibromyalgia, hypertension, thyroid disease who presents to the emergency department with right-sided pleuritic chest pain over the last several days.  Also with a cough and shortness of breath.  Was seen yesterday in urgent care and prescribed prednisone burst which she has been compliant with, diagnosed with urinary tract infection yesterday and started on Macrobid as well.  Pain is gotten worse since then.  Denies fever or chills.  No abdominal pain.  No nausea or vomiting.  No GI bleeding.  No history of blood clots.   External Medical Documents Reviewed: Urgent care visit from 03/20/2023 denoting the above      Physical Exam   Triage Vital Signs: ED Triage Vitals  Enc Vitals Group     BP 03/21/23 0729 (!) 141/106     Pulse Rate 03/21/23 0729 95     Resp 03/21/23 0729 20     Temp 03/21/23 0729 97.8 F (36.6 C)     Temp Source 03/21/23 0729 Oral     SpO2 03/21/23 0645 97 %     Weight 03/21/23 0651 167 lb (75.8 kg)     Height 03/21/23 0651 5\' 3"  (1.6 m)     Head Circumference --      Peak Flow --      Pain Score 03/21/23 0651 10     Pain Loc --      Pain Edu? --      Excl. in GC? --     Most recent vital signs: Vitals:   03/21/23 0930 03/21/23 0945  BP: 139/83   Pulse: 92 94  Resp: 19 20  Temp:    SpO2: (!) 89% 92%    General: Awake, no distress.  CV:  Good peripheral perfusion.  Resp:  Normal effort.  Abd:  No distention.  Other:  Cheerful, hypertensive otherwise vital signs normal no hypoxemia no fever.  No tenderness to palpation of the right side of her chest where she is stating she has pain.  Lungs with some mild wheezing expiratory bilaterally without focality.  Soft nontender abdomen to  palpation all quadrants.  Skin appears euvolemic, well-perfused   ED Results / Procedures / Treatments   Labs (all labs ordered are listed, but only abnormal results are displayed) Labs Reviewed  CBC WITH DIFFERENTIAL/PLATELET - Abnormal; Notable for the following components:      Result Value   WBC 10.7 (*)    Neutro Abs 8.4 (*)    All other components within normal limits  COMPREHENSIVE METABOLIC PANEL - Abnormal; Notable for the following components:   Glucose, Bld 297 (*)    Total Bilirubin <0.1 (*)    All other components within normal limits  LIPASE, BLOOD  TROPONIN I (HIGH SENSITIVITY)  TROPONIN I (HIGH SENSITIVITY)     I ordered and reviewed the above labs they are notable for flat trop x2  EKG  ED ECG REPORT I, Pilar Jarvis, the attending physician, personally viewed and interpreted this ECG.   Date: 03/21/2023  EKG Time: 0656  Rate: 102  Rhythm: sinus tachycardia  Axis: nl  Intervals:none  ST&T Change: no stemi    RADIOLOGY I independently reviewed and interpreted chest x-ray and  see no obvious focality or pneumothorax   PROCEDURES:  Critical Care performed: No  Procedures   MEDICATIONS ORDERED IN ED: Medications  ipratropium-albuterol (DUONEB) 0.5-2.5 (3) MG/3ML nebulizer solution (  Not Given 03/21/23 0922)  ketorolac (TORADOL) 15 MG/ML injection 15 mg (has no administration in time range)  morphine (PF) 4 MG/ML injection 4 mg (4 mg Intravenous Given 03/21/23 0901)  ondansetron (ZOFRAN) injection 4 mg (4 mg Intravenous Given 03/21/23 0901)  ipratropium-albuterol (DUONEB) 0.5-2.5 (3) MG/3ML nebulizer solution 6 mL (6 mLs Nebulization Given 03/21/23 0905)  iohexol (OMNIPAQUE) 350 MG/ML injection 75 mL (75 mLs Intravenous Contrast Given 03/21/23 0954)  doxycycline (VIBRA-TABS) tablet 100 mg (100 mg Oral Given 03/21/23 1045)     IMPRESSION / MDM / ASSESSMENT AND PLAN / ED COURSE  I reviewed the triage vital signs and the nursing notes.                                 Patient's presentation is most consistent with acute presentation with potential threat to life or bodily function.  Differential diagnosis includes, but is not limited to, PE, ACS, pneumonia, viral URI, pneumothorax, COPD exacerbation, asthma exacerbation   The patient is on the cardiac monitor to evaluate for evidence of arrhythmia and/or significant heart rate changes.  MDM:    COPD and evidence of exacerbation with productive cough, wheezing on auscultation already started on prednisone burst will give some nebulizers albuterol/ipratropium in the emergency department for wheezing and shortness of breath.  She has a pleuritic right-sided chest pain as well with her shortness of breath no evidence of bacterial pneumonia on chest x-ray we will proceed with CT angiogram for rule out PE.  EKG is nonischemic, will get serial troponins to assess for ACS given her chest pain.  --  Serial troponins flat, CT angiogram negative for any acute emergent cardiopulmonary pathologies.  She has some chest wall tenderness to palpation will give Toradol, anticipatory guidance pain control at home, short course of oxycodone, prescribed doxycycline for COPD exacerbation productive cough, and she will follow-up with PMD.         FINAL CLINICAL IMPRESSION(S) / ED DIAGNOSES   Final diagnoses:  COPD exacerbation (HCC)  Nonspecific chest pain     Rx / DC Orders   ED Discharge Orders          Ordered    doxycycline (VIBRAMYCIN) 50 MG capsule  2 times daily        03/21/23 1125    oxyCODONE (ROXICODONE) 5 MG immediate release tablet  Every 6 hours PRN        03/21/23 1125             Note:  This document was prepared using Dragon voice recognition software and may include unintentional dictation errors.    Pilar Jarvis, MD 03/21/23 1130

## 2023-03-21 NOTE — ED Triage Notes (Signed)
EMS brings pt in from home for c/o rt sided CP, just beneath breast radiating into back & SHOB, prod cough yellow sputum; seen at [facility] yesterday and currently taking prednisone for URI

## 2023-03-22 LAB — URINE CULTURE
Culture: 50000 — AB
Special Requests: NORMAL

## 2023-05-21 ENCOUNTER — Other Ambulatory Visit: Payer: Self-pay | Admitting: Cardiovascular Disease

## 2023-05-22 NOTE — Telephone Encounter (Signed)
Please contact pt for future appointment. Pt hasn't been seen since 10/2021. Pt must be seen yearly for refills or contact pcp.

## 2023-05-22 NOTE — Telephone Encounter (Signed)
Pt scheduled on 8/2

## 2023-05-23 NOTE — Progress Notes (Deleted)
Cardiology Office Note  Date:  05/23/2023   ID:  Catherine Giles, DOB 1970-12-20, MRN [id number]  PCP:  [facility]   No chief complaint on file.   HPI:  [name] is a 52 year old woman with past medical history of Goiter Rheumatoid arthritis Hypertension Diabetes Emphysema Fibromyalgia Who presents for referral from [facility] for consultation of her hypertension  Primary care notes indicating shortness of breath with underlying emphysema, poorly controlled blood pressure Followed by pulmonary, Dr. Meredeth Ide  On amlodipine 10, losartan 100, HCTZ 12.5  Seen in urgent care October 30, 2021 for shortness of breath On further reading of the note, has plumbing leaks from apartment above her, secondary to leaking has significant mold problem in the kitchen extending into living room She has used aggressive cleaning agents to try to clean up the mold, may have irritated her lungs She reports since then Blackmoldhas recurred, dripping down the walls  For worsening wheezing, asthmatic bronchitis, pulmonary has her on steroids  Blood pressure November 21, 2021 117/84 Blood pressure  November 09, 2021 158/78 BP at home 170 systolic  Echocardiogram July 2021 ejection fraction 60 to 65%  Presenting today with significant cough, wheezing  EKG personally reviewed by myself on todays visit Normal sinus rhythm rate of 90 bpm nonspecific T wave abnormality   PMH:   has a past medical history of Arthritis, Asthma, COPD (chronic obstructive pulmonary disease) (HCC), Depression, Diabetes mellitus without complication (HCC), Emphysema of lung (HCC), Family history of breast cancer, Fibromyalgia, Hypertension, Lupus (HCC), Pre-diabetes, and Thyroid disease.  PSH:    Past Surgical History:  Procedure Laterality Date   CHOLECYSTECTOMY, LAPAROSCOPIC  02/2022   MOUTH SURGERY     TRANSTHORACIC ECHOCARDIOGRAM  04/2020   EF 60 to 65%.  Normal LV function.  No  R WMA.  GR 1 DD.  Normal RV size and function.  Normal PAP.  Normal atrial size.  Normal valves.    Current Outpatient Medications  Medication Sig Dispense Refill   Acidophilus Lactobacillus CAPS Take 1 tablet by mouth daily.     amLODipine (NORVASC) 10 MG tablet Take 1 tablet (10 mg total) by mouth daily. 90 tablet 4   benzonatate (TESSALON) 100 MG capsule Take 2 capsules (200 mg total) by mouth every 8 (eight) hours. 21 capsule 0   Blood Glucose Monitoring Suppl (ONETOUCH VERIO) w/Device KIT Use to check blood sugar 3 times daily with goal <130 fasting and <180 two hours after meal.  Bring meter to visits. 1 kit 0   cetirizine (ZYRTEC) 10 MG tablet Take 1 tablet (10 mg total) by mouth daily. 90 tablet 11   cloNIDine (CATAPRES) 0.1 MG tablet TAKE 1 TABLET BY MOUTH TWICE DAILY 60 tablet 0   dapagliflozin propanediol (FARXIGA) 10 MG TABS tablet Take 1 tablet (10 mg total) by mouth daily before breakfast. 90 tablet 4   EPINEPHrine 0.3 mg/0.3 mL IJ SOAJ injection Inject 0.3 mg into the muscle as needed for anaphylaxis. 1 each 4   glucose blood test strip Use to check blood sugar 3 times daily with goal <130 fasting and <180 two hours after meal. 100 each 12   golimumab (SIMPONI ARIA) 50 MG/4ML SOLN injection Inject into the vein.     hydrochlorothiazide (HYDRODIURIL) 25 MG tablet Take 25 mg by mouth daily.     hydrOXYzine (VISTARIL) 25 MG capsule TAKE 1 CAPSULE BY MOUTH EVERY 8 HOURS ASNEEDED 60 capsule 0   ibuprofen (ADVIL) 600 MG tablet  Take 1 tablet (600 mg total) by mouth every 8 (eight) hours as needed. 30 tablet 0   ipratropium (ATROVENT) 0.06 % nasal spray Place 2 sprays into both nostrils 4 (four) times daily. 15 mL 12   ipratropium-albuterol (DUONEB) 0.5-2.5 (3) MG/3ML SOLN Take 3 mLs by nebulization every 6 (six) hours as needed for up to 7 days. 360 mL 0   Lancets (ONETOUCH ULTRASOFT) lancets Use to check blood sugar 3 times daily with goal <130 fasting and <180 two hours after meal. 100  each 12   Lancets (ONETOUCH ULTRASOFT) lancets Use as directed 60 each 5   leflunomide (ARAVA) 20 MG tablet Take 20 mg by mouth daily.     levothyroxine (SYNTHROID) 50 MCG tablet Take 50 mcg by mouth daily before breakfast.      losartan (COZAAR) 100 MG tablet Take 100 mg by mouth daily.     montelukast (SINGULAIR) 10 MG tablet Take 10 mg by mouth daily.     nicotine polacrilex (NICOTINE MINI) 4 MG lozenge Take 1 lozenge (4 mg total) by mouth as needed for smoking cessation. 100 tablet 0   nitrofurantoin, macrocrystal-monohydrate, (MACROBID) 100 MG capsule Take 1 capsule (100 mg total) by mouth 2 (two) times daily. 10 capsule 0   ondansetron (ZOFRAN-ODT) 4 MG disintegrating tablet Take 1 tablet (4 mg total) by mouth every 8 (eight) hours as needed for nausea or vomiting. 20 tablet 0   oxyCODONE (ROXICODONE) 5 MG immediate release tablet Take 1 tablet (5 mg total) by mouth every 6 (six) hours as needed for up to 12 doses for severe pain. 12 tablet 0   phenazopyridine (PYRIDIUM) 200 MG tablet Take 1 tablet (200 mg total) by mouth 3 (three) times daily. 6 tablet 0   polyethylene glycol (MIRALAX) 17 g packet Take 17 g by mouth daily. 14 each 0   promethazine-dextromethorphan (PROMETHAZINE-DM) 6.25-15 MG/5ML syrup Take 5 mLs by mouth 4 (four) times daily as needed. 118 mL 0   TRELEGY ELLIPTA 200-62.5-25 MCG/ACT AEPB Inhale 1 puff into the lungs daily.     No current facility-administered medications for this visit.    Allergies:   Azithromycin, Molds & smuts, Alpha-gal, Penicillins, and Shellfish allergy   Social History:  The patient  reports that she has been smoking cigarettes. She has a 34 pack-year smoking history. She has never used smokeless tobacco. She reports current alcohol use. She reports that she does not currently use drugs.   Family History:   family history includes Breast cancer (age of onset: 47) in her mother; Cancer in her maternal aunt; Diabetes in her maternal aunt, maternal  grandmother, maternal uncle, and mother; Hypertension in her father and maternal grandmother; Lung cancer in her mother; Ovarian cancer in her maternal aunt.    Review of Systems: Review of Systems  Constitutional: Negative.   HENT: Negative.    Respiratory:  Positive for cough, shortness of breath and wheezing.   Cardiovascular: Negative.   Gastrointestinal: Negative.   Musculoskeletal: Negative.   Neurological: Negative.   Psychiatric/Behavioral: Negative.    All other systems reviewed and are negative.    PHYSICAL EXAM: VS:  There were no vitals taken for this visit. , BMI There is no height or weight on file to calculate BMI. GEN: Well nourished, well developed, in no acute distress HEENT: normal Neck: no JVD, carotid bruits, or masses Cardiac: RRR; no murmurs, rubs, or gallops,no edema  Respiratory:  clear to auscultation bilaterally, normal work of breathing GI: soft,  nontender, nondistended, + BS MS: no deformity or atrophy Skin: warm and dry, no rash Neuro:  Strength and sensation are intact Psych: euthymic mood, full affect   Recent Labs: 10/07/2022: B Natriuretic Peptide 17.6; Magnesium 2.3 03/21/2023: ALT 19; BUN 16; Creatinine, Ser 0.69; Hemoglobin 14.5; Platelets 349; Potassium 3.6; Sodium 141    Lipid Panel Lab Results  Component Value Date   CHOL 156 12/02/2021   HDL 73 12/02/2021   LDLCALC 66 12/02/2021   TRIG 94 12/02/2021      Wt Readings from Last 3 Encounters:  03/21/23 167 lb (75.8 kg)  02/12/23 167 lb (75.8 kg)  12/25/22 167 lb (75.8 kg)      ASSESSMENT AND PLAN:  Problem List Items Addressed This Visit   None   Essential hypertension On amlodipine 10, HCTZ 12.5 daily with losartan 100 Recommend she increase HCTZ up to 25 daily, we will add clonidine 0.1 twice daily Recommend she continue to monitor blood pressure and call us with numbers Suspect some of her high pressures may be from coughing, pulmonary pathology, stress at  home  Asthmatic bronchitis Critical situation at home with black mold growing, plumbing leak from above, there has little in terms of mold remediation, just fans and dehumidifier is likely blowing the dust and black mold around her house -Prednisone and inhalers not helping, very wheezy on exam -Reports black mold has recurred despite fairly aggressive cleaning agents on her behalf which sent her to urgent care secondary to bronchospasm -Black mold dripping, extending from the kitchen into living room Desperate situation given management at her apartment complexes not acting aggressively, rapidly.  Given effects on her health as well as her child who has worsening asthmatic bronchitis, recommend she discuss with the county health department , what her options are.  Certainly this would be grounds for breaking her lease and moving to a different apartment.  Mold remediation would mean using plastic to seal off the mold area which would include her living room and kitchen.  Mold remediation team would be needed.  She cannot live in his apartment with her current pulmonary disease.  Diabetes type 2 Will be difficult to control as she is on prednisone Management primary care  Preventive care Normal echocardiogram 2021 Excellent cholesterol, non-smoker   Total encounter time more than 60 minutes  Greater than 50% was spent in counseling and coordination of care with the patient    Signed, Dossie Arbour, M.D., Ph.D. [facility] [place], [place] [phone]

## 2023-05-24 ENCOUNTER — Encounter: Payer: Self-pay | Admitting: *Deleted

## 2023-05-25 ENCOUNTER — Encounter: Payer: Self-pay | Admitting: Cardiovascular Disease

## 2023-05-25 ENCOUNTER — Ambulatory Visit: Payer: 59 | Attending: Cardiovascular Disease | Admitting: Cardiovascular Disease

## 2023-05-25 DIAGNOSIS — E1159 Type 2 diabetes mellitus with other circulatory complications: Secondary | ICD-10-CM

## 2023-05-25 DIAGNOSIS — E1169 Type 2 diabetes mellitus with other specified complication: Secondary | ICD-10-CM

## 2023-05-25 DIAGNOSIS — J449 Chronic obstructive pulmonary disease, unspecified: Secondary | ICD-10-CM

## 2023-05-25 DIAGNOSIS — I10 Essential (primary) hypertension: Secondary | ICD-10-CM

## 2023-05-25 DIAGNOSIS — I5032 Chronic diastolic (congestive) heart failure: Secondary | ICD-10-CM

## 2023-05-25 DIAGNOSIS — E118 Type 2 diabetes mellitus with unspecified complications: Secondary | ICD-10-CM

## 2023-06-04 ENCOUNTER — Ambulatory Visit (INDEPENDENT_AMBULATORY_CARE_PROVIDER_SITE_OTHER): Payer: 59

## 2023-06-04 ENCOUNTER — Ambulatory Visit
Admission: EM | Admit: 2023-06-04 | Discharge: 2023-06-04 | Disposition: A | Discharge status: Home / Self Care | Payer: 59 | Attending: Family Medicine | Admitting: Family Medicine

## 2023-06-04 DIAGNOSIS — Z20822 Contact with and (suspected) exposure to covid-19: Secondary | ICD-10-CM

## 2023-06-04 DIAGNOSIS — R042 Hemoptysis: Secondary | ICD-10-CM | POA: Insufficient documentation

## 2023-06-04 DIAGNOSIS — R519 Headache, unspecified: Secondary | ICD-10-CM | POA: Diagnosis present

## 2023-06-04 DIAGNOSIS — I5032 Chronic diastolic (congestive) heart failure: Secondary | ICD-10-CM | POA: Insufficient documentation

## 2023-06-04 DIAGNOSIS — Z1152 Encounter for screening for COVID-19: Secondary | ICD-10-CM | POA: Insufficient documentation

## 2023-06-04 DIAGNOSIS — J22 Unspecified acute lower respiratory infection: Secondary | ICD-10-CM | POA: Insufficient documentation

## 2023-06-04 DIAGNOSIS — I110 Hypertensive heart disease with heart failure: Secondary | ICD-10-CM | POA: Diagnosis not present

## 2023-06-04 MED ORDER — PROMETHAZINE-DM 6.25-15 MG/5ML PO SYRP: 5.0000 mL | ORAL_SOLUTION | Freq: Four times a day (QID) | ORAL | 0 refills | Status: DC | PRN

## 2023-06-04 MED ORDER — DOXYCYCLINE HYCLATE 100 MG PO CAPS: 100.0000 mg | ORAL_CAPSULE | Freq: Two times a day (BID) | ORAL | 0 refills | Status: DC

## 2023-06-04 MED ORDER — BENZONATATE 100 MG PO CAPS: 200.0000 mg | ORAL_CAPSULE | Freq: Three times a day (TID) | ORAL | 0 refills | Status: DC | PRN

## 2023-06-04 MED ORDER — DEXAMETHASONE SODIUM PHOSPHATE 10 MG/ML IJ SOLN
10.0000 mg | Freq: Once | INTRAMUSCULAR | Status: AC
  Administered 2023-06-04: 10 mg via INTRAMUSCULAR

## 2023-06-04 MED ORDER — PREDNISONE 20 MG PO TABS: 40.0000 mg | ORAL_TABLET | Freq: Every day | ORAL | 0 refills | Status: DC

## 2023-06-04 NOTE — Discharge Instructions (Signed)
Your Covid test is pending.  I will send Paxlovid to the pharmacy for you tomorrow if  your COVID results positive. You are being treated for what appears to be  pneumonia on review of chest x-ray.  Complete all medication as prescribed.  If at any point your breathing worsen and does not improve with home nebulizer treatments and oxygen this would be indication to go to the emergency department.

## 2023-06-04 NOTE — ED Provider Notes (Signed)
Catherine Giles    CSN: [id number] Arrival date & time: 06/04/23  6314      History   Chief Complaint Chief Complaint  Patient presents with   Cough   Headache    HPI Catherine Giles is a 52 y.o. female, Of COPD, type 2 diabetes, RA, emphysema, asthma.  HPI Presents today with a concern that she may have COVID-19 infection.  Patient reports over the last day she has developed generalized bodyaches, lower back pain, sore throat, productive cough which has been productive of bloody discharge, headaches, and increased use of albuterol nebulizer treatments and increased use of home as needed oxygen.  Patient is prescribed 2 L of oxygen as needed at home.  She reports since the onset of symptoms she has been using oxygen day which is not her normal baseline.  On arrival patient's oxygen level is 92%. Unaware of any known exposure however was around her grandchildren who were sick with upper respiratory symptoms about a week ago.  Requesting to for testing.  COVID 3 times and reports that this feels like previous COVID infections.  Last DUO neb treatment was around 7:30 AM. Past Medical History:  Diagnosis Date   Arthritis    RA   Asthma    COPD (chronic obstructive pulmonary disease) (HCC)    Depression    Diabetes mellitus without complication (HCC)    Emphysema of lung (HCC)    Family history of breast cancer    6/21 cancer genetic testing letter sent   Fibromyalgia    Hypertension    Lupus (HCC)    Pre-diabetes    Thyroid disease     Patient Active Problem List   Diagnosis Date Noted   Hemoptysis 06/04/2023   Bronchopneumonia 10/09/2022   Influenza A 10/07/2022   Depression with anxiety 10/07/2022   COPD exacerbation (HCC) 10/07/2022   HTN (hypertension) 10/07/2022   Tobacco abuse 10/07/2022   Chest pain 09/10/2022   COPD (chronic obstructive pulmonary disease) (HCC)    Hypothyroidism    Nicotine dependence, cigarettes, uncomplicated    Chronic diastolic  CHF (congestive heart failure) (HCC)    Sepsis (HCC)    Allergy to alpha-gal 01/07/2022   Depression, major, single episode, mild (HCC) 01/24/2021   Dairy allergy 10/12/2020   Vaginal stenosis 03/19/2020   Obesity 03/19/2020   Insomnia 01/05/2020   Complex regional pain syndrome I of upper limb 12/17/2019   Hypertension associated with diabetes (HCC) 12/13/2019   Type II diabetes mellitus with complication (HCC) 12/13/2019   Hashimoto's thyroiditis 12/13/2019   DDD (degenerative disc disease), lumbar 08/26/2019   Multinodular goiter 08/14/2019   Chronic left hip pain 05/05/2019   Osteoarthritis of spine with radiculopathy, cervical region 05/05/2019   Polypharmacy 02/14/2019   Fibromyalgia 02/10/2019   Rheumatoid arthritis involving multiple sites with positive rheumatoid factor (HCC) 02/10/2019   Cervical radiculopathy 03/13/2018   Vitamin D deficiency 10/11/2015    Past Surgical History:  Procedure Laterality Date   CHOLECYSTECTOMY, LAPAROSCOPIC  02/2022   MOUTH SURGERY     TRANSTHORACIC ECHOCARDIOGRAM  04/2020   EF 60 to 65%.  Normal LV function.  No R WMA.  GR 1 DD.  Normal RV size and function.  Normal PAP.  Normal atrial size.  Normal valves.    OB History     Gravida  9   Para  6   Term  0   Preterm  0   AB  3   Living  SAB  2   IAB  0   Ectopic  0   Multiple      Live Births               Home Medications    Prior to Admission medications   Medication Sig Start Date End Date Taking? Authorizing Provider  benzonatate (TESSALON) 100 MG capsule Take 2 capsules (200 mg total) by mouth 3 (three) times daily as needed for cough. 06/04/23  Yes Bing Neighbors, NP  cyclobenzaprine (FLEXERIL) 5 MG tablet Take 1 tablet every day by oral route at bedtime. 05/16/23  Yes [provider]  doxycycline (VIBRAMYCIN) 100 MG capsule Take 1 capsule (100 mg total) by mouth 2 (two) times daily. 06/04/23  Yes Bing Neighbors, NP  metFORMIN  (GLUCOPHAGE-XR) 500 MG 24 hr tablet  05/08/23  Yes [provider]  predniSONE (DELTASONE) 20 MG tablet Take 2 tablets (40 mg total) by mouth daily with breakfast. 06/04/23  Yes Bing Neighbors, NP  promethazine-dextromethorphan (PROMETHAZINE-DM) 6.25-15 MG/5ML syrup Take 5 mLs by mouth 4 (four) times daily as needed for cough. 06/04/23  Yes Bing Neighbors, NP  Acidophilus Lactobacillus CAPS Take 1 tablet by mouth daily.    [provider]  albuterol (VENTOLIN HFA) 108 (90 Base) MCG/ACT inhaler     [provider]  amLODipine (NORVASC) 10 MG tablet Take 1 tablet (10 mg total) by mouth daily. 09/05/21   Cannady, Corrie Dandy T, NP  Blood Glucose Monitoring Suppl (ONETOUCH VERIO) w/Device KIT Use to check blood sugar 3 times daily with goal <130 fasting and <180 two hours after meal.  Bring meter to visits. 01/25/21   Cannady, Corrie Dandy T, NP  cetirizine (ZYRTEC) 10 MG tablet Take 1 tablet (10 mg total) by mouth daily. 01/03/22   Gabriel Cirri, NP  cloNIDine (CATAPRES) 0.1 MG tablet TAKE 1 TABLET BY MOUTH TWICE DAILY 05/23/23   Antonieta Iba, MD  dapagliflozin propanediol (FARXIGA) 10 MG TABS tablet Take 1 tablet (10 mg total) by mouth daily before breakfast. 03/16/21   Cannady, Corrie Dandy T, NP  EPINEPHrine 0.3 mg/0.3 mL IJ SOAJ injection Inject 0.3 mg into the muscle as needed for anaphylaxis. 02/15/22   Aura Dials T, NP  glucose blood test strip Use to check blood sugar 3 times daily with goal <130 fasting and <180 two hours after meal. 01/25/21   Cannady, Jolene T, NP  golimumab (SIMPONI ARIA) 50 MG/4ML SOLN injection Inject into the vein.    [provider]  hydrochlorothiazide (HYDRODIURIL) 25 MG tablet Take 25 mg by mouth daily. 01/22/23   [provider]  hydrOXYzine (VISTARIL) 25 MG capsule TAKE 1 CAPSULE BY MOUTH EVERY 8 HOURS ASNEEDED 01/03/22   Gabriel Cirri, NP  ibuprofen (ADVIL) 600 MG tablet Take 1 tablet (600 mg total) by mouth every 8 (eight) hours as  needed. 10/10/22   Enedina Finner, MD  ipratropium (ATROVENT) 0.06 % nasal spray Place 2 sprays into both nostrils 4 (four) times daily. 03/20/23   Becky Augusta, NP  ipratropium-albuterol (DUONEB) 0.5-2.5 (3) MG/3ML SOLN Take 3 mLs by nebulization every 6 (six) hours as needed for up to 7 days. 10/30/21 03/20/23  Eusebio Friendly B, PA-C  Lancets [facility] ULTRASOFT) lancets Use to check blood sugar 3 times daily with goal <130 fasting and <180 two hours after meal. 01/25/21   Aura Dials T, NP  Lancets (ONETOUCH ULTRASOFT) lancets Use as directed 03/21/23   Pilar Jarvis, MD  leflunomide (ARAVA) 20 MG  tablet Take 20 mg by mouth daily. 01/22/21   [provider]  levothyroxine (SYNTHROID) 50 MCG tablet Take 50 mcg by mouth daily before breakfast.     [provider]  losartan (COZAAR) 100 MG tablet Take 100 mg by mouth daily.    [provider]  montelukast (SINGULAIR) 10 MG tablet Take 10 mg by mouth daily. 02/15/22   [provider]  nicotine polacrilex (NICOTINE MINI) 4 MG lozenge Take 1 lozenge (4 mg total) by mouth as needed for smoking cessation. 02/13/23   Pilar Jarvis, MD  ondansetron (ZOFRAN-ODT) 4 MG disintegrating tablet Take 1 tablet (4 mg total) by mouth every 8 (eight) hours as needed for nausea or vomiting. 12/25/22   Minna Antis, MD  oxyCODONE (ROXICODONE) 5 MG immediate release tablet Take 1 tablet (5 mg total) by mouth every 6 (six) hours as needed for up to 12 doses for severe pain. 03/21/23   Pilar Jarvis, MD  polyethylene glycol (MIRALAX) 17 g packet Take 17 g by mouth daily. 03/15/22   Wouk, Wilfred Curtis, MD  TRELEGY Salomon Mast 200-62.5-25 MCG/ACT AEPB Inhale 1 puff into the lungs daily. 09/11/22   [provider]  traZODone (DESYREL) 50 MG tablet Take 1 tablet (50 mg total) by mouth at bedtime. 01/05/20 07/01/20  Aura Dials T, NP    Family History Family History  Problem Relation Age of Onset   Lung cancer Mother    Diabetes Mother    Breast  cancer Mother 56   Hypertension Father    Cancer Maternal Aunt    Ovarian cancer Maternal Aunt    Diabetes Maternal Aunt    Diabetes Maternal Uncle    Hypertension Maternal Grandmother    Diabetes Maternal Grandmother     Social History Social History   Tobacco Use   Smoking status: Every Day    Current packs/day: 1.00    Average packs/day: 1 pack/day for 34.0 years (34.0 ttl pk-yrs)    Types: Cigarettes   Smokeless tobacco: Never   Tobacco comments:    2-3 cig/day  Vaping Use   Vaping status: Never Used  Substance Use Topics   Alcohol use: Yes    Comment: Occ   Drug use: Not Currently     Allergies   Azithromycin, Molds & smuts, Alpha-gal, Penicillins, and Shellfish allergy   Review of Systems Review of Systems Pertinent negatives listed in HPI   Physical Exam Triage Vital Signs ED Triage Vitals  Encounter Vitals Group     BP 06/04/23 0937 (!) 136/93     Systolic BP Percentile --      Diastolic BP Percentile --      Pulse Rate 06/04/23 0937 99     Resp 06/04/23 0937 18     Temp 06/04/23 0937 98 F (36.7 C)     Temp Source 06/04/23 0937 Oral     SpO2 06/04/23 0937 92 %     Weight --      Height --      Head Circumference --      Peak Flow --      Pain Score 06/04/23 0942 8     Pain Loc --      Pain Education --      Exclude from Growth Chart --    No data found.  Updated Vital Signs BP (!) 136/93 (BP Location: Left Arm)   Pulse 99   Temp 98 F (36.7 C) (Oral)   Resp 18   SpO2 92%  Visual Acuity Right Eye Distance:   Left Eye Distance:   Bilateral Distance:    Right Eye Near:   Left Eye Near:    Bilateral Near:     Physical Exam Constitutional:      General: She is not in acute distress.    Appearance: She is well-developed and normal weight. She is ill-appearing. She is not toxic-appearing.  HENT:     Head: Normocephalic and atraumatic.     Nose: Congestion and rhinorrhea present.  Eyes:     Extraocular Movements: Extraocular  movements intact.     Conjunctiva/sclera: Conjunctivae normal.     Pupils: Pupils are equal, round, and reactive to light.  Cardiovascular:     Rate and Rhythm: Normal rate and regular rhythm.  Pulmonary:     Breath sounds: Decreased air movement present. Wheezing, rhonchi and rales present.  Musculoskeletal:        General: Normal range of motion.     Cervical back: Normal range of motion and neck supple.  Skin:    General: Skin is warm and dry.  Neurological:     General: No focal deficit present.     Mental Status: She is alert.      UC Treatments / Results  Labs (all labs ordered are listed, but only abnormal results are displayed) Labs Reviewed  SARS CORONAVIRUS 2 (TAT 6-24 HRS)    EKG   Radiology DG Chest 2 View  Result Date: 06/04/2023 CLINICAL DATA:  Hemoptysis with shortness of breath and wheezing EXAM: CHEST - 2 VIEW COMPARISON:  03/21/2023 FINDINGS: Linear scarring which is stable in the left mid and bilateral lower lungs primarily. There is no edema, consolidation, effusion, or pneumothorax. Heart size and mediastinal contours are stable. IMPRESSION: Chronic lung disease with scarring. No acute superimposed finding when compared to prior. Electronically Signed   By: Tiburcio Pea M.D.   On: 06/04/2023 10:38    Procedures Procedures (including critical care time)  Medications Ordered in UC Medications  dexamethasone (DECADRON) injection 10 mg (10 mg Intramuscular Given 06/04/23 1026)    Initial Impression / Assessment and Plan / UC Course  I have reviewed the triage vital signs and the nursing notes.  Pertinent labs & imaging results that were available during my care of the patient were reviewed by me and considered in my medical decision making (see chart for details).    COVID test pending.  X-ray concerning for possible middle lobe pneumonia,however, radiology over-read pending.  Will cover with Doxycyline as patient is high risk for pneumonia and  complications associated with pneumonia. Decadron 10 mg IM given here in clinic. Manage fever with Tylenol.  Nasal symptoms with over-the-counter antihistamines recommended.  Treatment per discharge medications/discharge instructions.  Red flags/ER precautions given. The most current CDC isolation/quarantine recommendation advised.   -Patient advised that she would qualify for Paxlovid.  Will review test results tomorrow and will send Paxlovid if patient is COVID-positive. Final Clinical Impressions(s) / UC Diagnoses   Final diagnoses:  Hemoptysis  Encounter for laboratory testing for COVID-19 virus  Lower respiratory infection (e.g., bronchitis, pneumonia, pneumonitis, pulmonitis)     Discharge Instructions      Your Covid test is pending.  I will send Paxlovid to the pharmacy for you tomorrow if  your COVID results positive. You are being treated for what appears to be  pneumonia on review of chest x-ray.  Complete all medication as prescribed.  If at any point your breathing worsen and does not  improve with home nebulizer treatments and oxygen this would be indication to go to the emergency department.       ED Prescriptions     Medication Sig Dispense Auth. Provider   predniSONE (DELTASONE) 20 MG tablet Take 2 tablets (40 mg total) by mouth daily with breakfast. 10 tablet Bing Neighbors, NP   doxycycline (VIBRAMYCIN) 100 MG capsule Take 1 capsule (100 mg total) by mouth 2 (two) times daily. 20 capsule Bing Neighbors, NP   promethazine-dextromethorphan (PROMETHAZINE-DM) 6.25-15 MG/5ML syrup Take 5 mLs by mouth 4 (four) times daily as needed for cough. 240 mL Bing Neighbors, NP   benzonatate (TESSALON) 100 MG capsule Take 2 capsules (200 mg total) by mouth 3 (three) times daily as needed for cough. 40 capsule Bing Neighbors, NP      PDMP not reviewed this encounter.   Bing Neighbors, NP 06/04/23 1113

## 2023-06-04 NOTE — ED Triage Notes (Signed)
Patient to Urgent Care with complaints of generalized body aches/ lower back pain/ sore throat/ productive cough/ headaches. Fever yesterday. Reports she has seen blood in her mucus when she coughs.   Symptoms started two days ago. Requests Covid testing.   Reports increased usage of her albuterol inhaler.

## 2023-06-19 ENCOUNTER — Ambulatory Visit
Admission: EM | Admit: 2023-06-19 | Discharge: 2023-06-19 | Disposition: A | Discharge status: Home / Self Care | Payer: 59 | Attending: Physician Assistant | Admitting: Physician Assistant

## 2023-06-19 DIAGNOSIS — J441 Chronic obstructive pulmonary disease with (acute) exacerbation: Secondary | ICD-10-CM | POA: Diagnosis not present

## 2023-06-19 DIAGNOSIS — R0602 Shortness of breath: Secondary | ICD-10-CM | POA: Diagnosis not present

## 2023-06-19 DIAGNOSIS — R051 Acute cough: Secondary | ICD-10-CM

## 2023-06-19 MED ORDER — LEVOFLOXACIN 750 MG PO TABS: 750.0000 mg | ORAL_TABLET | Freq: Every day | ORAL | 0 refills | Status: AC

## 2023-06-19 MED ORDER — PREDNISONE 10 MG PO TABS: ORAL_TABLET | ORAL | 0 refills | Status: DC

## 2023-06-19 MED ORDER — GUAIFENESIN-CODEINE 100-10 MG/5ML PO SYRP: 10.0000 mL | ORAL_SOLUTION | Freq: Three times a day (TID) | ORAL | 0 refills | Status: DC | PRN

## 2023-06-19 NOTE — ED Triage Notes (Signed)
Pt c/o back pain,fatigue,chest pain & cough x14 days. Was seen at [facility] for the same issue. Was given tessalon,doxy,prednisone & promethazine w/o relief.

## 2023-06-19 NOTE — ED Provider Notes (Signed)
[facility]    CSN: [id number] Arrival date & time: 06/19/23  1243      History   Chief Complaint Chief Complaint  Patient presents with   Back Pain   Fatigue   Cough    HPI Catherine Giles is a 52 y.o. female with history of asthma and COPD as well as lupus, hypertension, fibromyalgia and diabetes.  Patient presents today for 2-week history of increased shortness of breath, cough, chest pain when coughing, fatigue, and nasal congestion. Patient reports she think this flared up her asthma and COPD.  Evaluated at a different urgent care on 06/04/23 for these symptoms--negative CXR and COVID test. She was treated with doxycycline, prednisone, promethazine DM and benzonatate. She took all meds and says she feels worse now. She has used albuterol and it has helped--breathing treatment done before visit today.   Patient denies any associated fevers, sore throat, vomiting or diarrhea.    HPI  Past Medical History:  Diagnosis Date   Arthritis    RA   Asthma    COPD (chronic obstructive pulmonary disease) (HCC)    Depression    Diabetes mellitus without complication (HCC)    Emphysema of lung (HCC)    Family history of breast cancer    6/21 cancer genetic testing letter sent   Fibromyalgia    Hypertension    Lupus (HCC)    Pre-diabetes    Thyroid disease     Patient Active Problem List   Diagnosis Date Noted   Hemoptysis 06/04/2023   Bronchopneumonia 10/09/2022   Influenza A 10/07/2022   Depression with anxiety 10/07/2022   COPD exacerbation (HCC) 10/07/2022   HTN (hypertension) 10/07/2022   Tobacco abuse 10/07/2022   Chest pain 09/10/2022   COPD (chronic obstructive pulmonary disease) (HCC)    Hypothyroidism    Nicotine dependence, cigarettes, uncomplicated    Chronic diastolic CHF (congestive heart failure) (HCC)    Sepsis (HCC)    Allergy to alpha-gal 01/07/2022   Depression, major, single episode, mild (HCC) 01/24/2021   Dairy allergy 10/12/2020    Vaginal stenosis 03/19/2020   Obesity 03/19/2020   Insomnia 01/05/2020   Complex regional pain syndrome I of upper limb 12/17/2019   Hypertension associated with diabetes (HCC) 12/13/2019   Type II diabetes mellitus with complication (HCC) 12/13/2019   Hashimoto's thyroiditis 12/13/2019   DDD (degenerative disc disease), lumbar 08/26/2019   Multinodular goiter 08/14/2019   Chronic left hip pain 05/05/2019   Osteoarthritis of spine with radiculopathy, cervical region 05/05/2019   Polypharmacy 02/14/2019   Fibromyalgia 02/10/2019   Rheumatoid arthritis involving multiple sites with positive rheumatoid factor (HCC) 02/10/2019   Cervical radiculopathy 03/13/2018   Vitamin D deficiency 10/11/2015    Past Surgical History:  Procedure Laterality Date   CHOLECYSTECTOMY, LAPAROSCOPIC  02/2022   MOUTH SURGERY     TRANSTHORACIC ECHOCARDIOGRAM  04/2020   EF 60 to 65%.  Normal LV function.  No R WMA.  GR 1 DD.  Normal RV size and function.  Normal PAP.  Normal atrial size.  Normal valves.    OB History     Gravida  9   Para  6   Term  0   Preterm  0   AB  3   Living         SAB  2   IAB  0   Ectopic  0   Multiple      Live Births  Home Medications    Prior to Admission medications   Medication Sig Start Date End Date Taking? Authorizing Provider  Acidophilus Lactobacillus CAPS Take 1 tablet by mouth daily.   Yes [provider]  albuterol (VENTOLIN HFA) 108 (90 Base) MCG/ACT inhaler    Yes [provider]  amLODipine (NORVASC) 10 MG tablet Take 1 tablet (10 mg total) by mouth daily. 09/05/21  Yes Cannady, Jolene T, NP  Blood Glucose Monitoring Suppl (ONETOUCH VERIO) w/Device KIT Use to check blood sugar 3 times daily with goal <130 fasting and <180 two hours after meal.  Bring meter to visits. 01/25/21  Yes Cannady, Jolene T, NP  cetirizine (ZYRTEC) 10 MG tablet Take 1 tablet (10 mg total) by mouth daily. 01/03/22  Yes Gabriel Cirri,  NP  cloNIDine (CATAPRES) 0.1 MG tablet TAKE 1 TABLET BY MOUTH TWICE DAILY 05/23/23  Yes Gollan, Tollie Pizza, MD  cyclobenzaprine (FLEXERIL) 5 MG tablet Take 1 tablet every day by oral route at bedtime. 05/16/23  Yes [provider]  dapagliflozin propanediol (FARXIGA) 10 MG TABS tablet Take 1 tablet (10 mg total) by mouth daily before breakfast. 03/16/21  Yes Cannady, Jolene T, NP  EPINEPHrine 0.3 mg/0.3 mL IJ SOAJ injection Inject 0.3 mg into the muscle as needed for anaphylaxis. 02/15/22  Yes Cannady, Jolene T, NP  glucose blood test strip Use to check blood sugar 3 times daily with goal <130 fasting and <180 two hours after meal. 01/25/21  Yes Cannady, Jolene T, NP  golimumab (SIMPONI ARIA) 50 MG/4ML SOLN injection Inject into the vein.   Yes [provider]  guaiFENesin-codeine (ROBITUSSIN AC) 100-10 MG/5ML syrup Take 10 mLs by mouth 3 (three) times daily as needed for cough or congestion. 06/19/23  Yes Shirlee Latch, PA-C  hydrochlorothiazide (HYDRODIURIL) 25 MG tablet Take 25 mg by mouth daily. 01/22/23  Yes [provider]  hydrOXYzine (VISTARIL) 25 MG capsule TAKE 1 CAPSULE BY MOUTH EVERY 8 HOURS ASNEEDED 01/03/22  Yes Gabriel Cirri, NP  ibuprofen (ADVIL) 600 MG tablet Take 1 tablet (600 mg total) by mouth every 8 (eight) hours as needed. 10/10/22  Yes Enedina Finner, MD  ipratropium (ATROVENT) 0.06 % nasal spray Place 2 sprays into both nostrils 4 (four) times daily. 03/20/23  Yes Becky Augusta, NP  Lancets [facility] ULTRASOFT) lancets Use to check blood sugar 3 times daily with goal <130 fasting and <180 two hours after meal. 01/25/21  Yes Cannady, Corrie Dandy T, NP  Lancets (ONETOUCH ULTRASOFT) lancets Use as directed 03/21/23  Yes Pilar Jarvis, MD  leflunomide (ARAVA) 20 MG tablet Take 20 mg by mouth daily. 01/22/21  Yes [provider]  levofloxacin (LEVAQUIN) 750 MG tablet Take 1 tablet (750 mg total) by mouth daily for 5 days. 06/19/23 06/24/23 Yes Shirlee Latch, PA-C   levothyroxine (SYNTHROID) 50 MCG tablet Take 50 mcg by mouth daily before breakfast.    Yes [provider]  losartan (COZAAR) 100 MG tablet Take 100 mg by mouth daily.   Yes [provider]  metFORMIN (GLUCOPHAGE-XR) 500 MG 24 hr tablet  05/08/23  Yes [provider]  montelukast (SINGULAIR) 10 MG tablet Take 10 mg by mouth daily. 02/15/22  Yes [provider]  nicotine polacrilex (NICOTINE MINI) 4 MG lozenge Take 1 lozenge (4 mg total) by mouth as needed for smoking cessation. 02/13/23  Yes Pilar Jarvis, MD  ondansetron (ZOFRAN-ODT) 4 MG disintegrating tablet Take 1 tablet (4 mg total) by mouth every 8 (eight) hours as  needed for nausea or vomiting. 12/25/22  Yes Minna Antis, MD  oxyCODONE (ROXICODONE) 5 MG immediate release tablet Take 1 tablet (5 mg total) by mouth every 6 (six) hours as needed for up to 12 doses for severe pain. 03/21/23  Yes Pilar Jarvis, MD  polyethylene glycol (MIRALAX) 17 g packet Take 17 g by mouth daily. 03/15/22  Yes Wouk, Wilfred Curtis, MD  predniSONE (DELTASONE) 10 MG tablet Take 6 tabs p.o. on day 1 and decrease by 1 tablet daily until complete 06/19/23  Yes Shirlee Latch, PA-C  TRELEGY ELLIPTA 200-62.5-25 MCG/ACT AEPB Inhale 1 puff into the lungs daily. 09/11/22  Yes [provider]  ipratropium-albuterol (DUONEB) 0.5-2.5 (3) MG/3ML SOLN Take 3 mLs by nebulization every 6 (six) hours as needed for up to 7 days. 10/30/21 03/20/23  Shirlee Latch, PA-C  traZODone (DESYREL) 50 MG tablet Take 1 tablet (50 mg total) by mouth at bedtime. 01/05/20 07/01/20  Aura Dials T, NP    Family History Family History  Problem Relation Age of Onset   Lung cancer Mother    Diabetes Mother    Breast cancer Mother 61   Hypertension Father    Cancer Maternal Aunt    Ovarian cancer Maternal Aunt    Diabetes Maternal Aunt    Diabetes Maternal Uncle    Hypertension Maternal Grandmother    Diabetes Maternal Grandmother     Social  History Social History   Tobacco Use   Smoking status: Every Day    Current packs/day: 1.00    Average packs/day: 1 pack/day for 34.0 years (34.0 ttl pk-yrs)    Types: Cigarettes   Smokeless tobacco: Never   Tobacco comments:    2-3 cig/day  Vaping Use   Vaping status: Never Used  Substance Use Topics   Alcohol use: Yes    Comment: Occ   Drug use: Not Currently     Allergies   Azithromycin, Molds & smuts, Alpha-gal, Penicillins, and Shellfish allergy   Review of Systems Review of Systems  Constitutional:  Positive for fatigue. Negative for chills, diaphoresis and fever.  HENT:  Positive for congestion and rhinorrhea. Negative for ear pain, sinus pressure, sinus pain and sore throat.   Respiratory:  Positive for cough and shortness of breath.   Cardiovascular:  Positive for chest pain.  Gastrointestinal:  Negative for abdominal pain, diarrhea, nausea and vomiting.  Musculoskeletal:  Positive for back pain.  Skin:  Negative for rash.  Neurological:  Positive for headaches. Negative for weakness.  Hematological:  Negative for adenopathy.     Physical Exam Triage Vital Signs ED Triage Vitals  Enc Vitals Group     BP 10/30/21 0832 (!) 166/155     Pulse Rate 10/30/21 0832 84     Resp --      Temp 10/30/21 0832 97.9 F (36.6 C)     Temp Source 10/30/21 0832 Oral     SpO2 10/30/21 0832 97 %     Weight 10/30/21 0835 170 lb (77.1 kg)     Height 10/30/21 0835 5\' 3"  (1.6 m)     Head Circumference --      Peak Flow --      Pain Score 10/30/21 0835 0     Pain Loc --      Pain Edu? --      Excl. in GC? --    No data found.  Updated Vital Signs BP (!) 141/82 (BP Location: Right Arm)   Pulse 95  Temp 98.6 F (37 C) (Oral)   Resp 16   Ht 5\' 3"  (1.6 m)   Wt 165 lb (74.8 kg)   SpO2 95%   BMI 29.23 kg/m   Physical Exam Vitals and nursing note reviewed.  Constitutional:      General: She is not in acute distress.    Appearance: Normal appearance. She is  ill-appearing. She is not toxic-appearing.  HENT:     Head: Normocephalic and atraumatic.     Nose: No congestion.     Mouth/Throat:     Mouth: Mucous membranes are moist.     Pharynx: Oropharynx is clear.  Eyes:     General: No scleral icterus.       Right eye: No discharge.        Left eye: No discharge.     Conjunctiva/sclera: Conjunctivae normal.  Cardiovascular:     Rate and Rhythm: Normal rate and regular rhythm.     Heart sounds: Normal heart sounds.  Pulmonary:     Effort: Pulmonary effort is normal. No respiratory distress.     Breath sounds: Wheezing (few scattered wheezes) present. No rhonchi or rales.  Musculoskeletal:     Cervical back: Neck supple.  Skin:    General: Skin is dry.  Neurological:     General: No focal deficit present.     Mental Status: She is alert. Mental status is at baseline.     Motor: No weakness.     Gait: Gait normal.  Psychiatric:        Mood and Affect: Affect is tearful.      UC Treatments / Results  Labs (all labs ordered are listed, but only abnormal results are displayed) Labs Reviewed - No data to display   EKG   Radiology No results found.  Procedures Procedures (including critical care time)  Medications Ordered in UC Medications - No data to display  Initial Impression / Assessment and Plan / UC Course  I have reviewed the triage vital signs and the nursing notes.  Pertinent labs & imaging results that were available during my care of the patient were reviewed by me and considered in my medical decision making (see chart for details).  52 year old female with history of asthma, COPD, lupus, hypertension is presenting for increased fatigue, cough, congestion, shortness of breath, chest pain, and headaches for the past 2 weeks. Treated 2 weeks ago for these symptoms with prednisone, doxycycline, benzonatate and promethazine DM.  Negative CXR and COVID test 2 weeks ago.  Patient is afebrile. O2 is 95%. She is  ill-appearing but nontoxic.  She is tearful. Few scattered wheezes. No distress.  Reviewed previous labs and CXR.  Continued COPD exacerbation. Patient has allergies to PCN and azithromycin. No improvement with doxycyline. Will treat with Levaquin at this time, prednisone taper and Cheratussin. Reviewed controlled substance database. Encouraged her to increase rest and fluids.  Discussed ED precautions.   Final Clinical Impressions(s) / UC Diagnoses   Final diagnoses:  COPD exacerbation (HCC)  Acute cough  Shortness of breath     Discharge Instructions      -I sent stronger antibiotics to the pharmacy, more corticosteroids and a stronger cough medicine.  Continue to use your breathing treatments and nebulizer at home.  Increase rest and fluids. - Go to ER if fever or worsening symptoms.      ED Prescriptions     Medication Sig Dispense Auth. Provider   levofloxacin (LEVAQUIN) 750 MG tablet Take 1 tablet (  750 mg total) by mouth daily for 5 days. 5 tablet Eusebio Friendly B, PA-C   predniSONE (DELTASONE) 10 MG tablet Take 6 tabs p.o. on day 1 and decrease by 1 tablet daily until complete 21 tablet Eusebio Friendly B, PA-C   guaiFENesin-codeine (ROBITUSSIN AC) 100-10 MG/5ML syrup Take 10 mLs by mouth 3 (three) times daily as needed for cough or congestion. 120 mL Shirlee Latch, PA-C      I have reviewed the PDMP during this encounter.      Shirlee Latch, PA-C 06/19/23 1321

## 2023-06-19 NOTE — Discharge Instructions (Signed)
-  I sent stronger antibiotics to the pharmacy, more corticosteroids and a stronger cough medicine.  Continue to use your breathing treatments and nebulizer at home.  Increase rest and fluids. - Go to ER if fever or worsening symptoms.

## 2023-07-02 ENCOUNTER — Other Ambulatory Visit (HOSPITAL_COMMUNITY)
Admission: RE | Admit: 2023-07-02 | Discharge: 2023-07-02 | Disposition: A | Discharge status: Home / Self Care | Payer: 59 | Source: Ambulatory Visit | Attending: Obstetrics and Gynecology | Admitting: Obstetrics and Gynecology

## 2023-07-02 ENCOUNTER — Ambulatory Visit (INDEPENDENT_AMBULATORY_CARE_PROVIDER_SITE_OTHER): Payer: 59 | Admitting: Obstetrics

## 2023-07-02 ENCOUNTER — Ambulatory Visit: Payer: 59 | Admitting: Obstetrics and Gynecology

## 2023-07-02 VITALS — BP 114/74 | HR 100 | Resp 16 | Ht 63.0 in | Wt 169.7 lb

## 2023-07-02 DIAGNOSIS — N898 Other specified noninflammatory disorders of vagina: Secondary | ICD-10-CM

## 2023-07-02 MED ORDER — METRONIDAZOLE 500 MG PO TABS
500.0000 mg | ORAL_TABLET | Freq: Two times a day (BID) | ORAL | 0 refills | Status: AC
Start: 2023-07-02 — End: 2023-07-09

## 2023-07-02 NOTE — Progress Notes (Signed)
    GYNECOLOGY PROGRESS NOTE  Subjective:    Patient ID: Catherine Giles, female    DOB: 01/04/1971, 52 y.o.   MRN: [id number]  HPI  Patient is a 52 y.o. [id number] female who presents for evaluation of vaginal odor. She has been having recurrent vaginal odor   The following portions of the patient's history were reviewed and updated as appropriate: allergies, current medications, past medical history, past social history, and problem list.  Review of Systems Pertinent items are noted in HPI.   Objective:   Blood pressure 114/74, pulse 100, resp. rate 16, height 5\' 3"  (1.6 m), weight 169 lb 11.2 oz (77 kg). Body mass index is 30.06 kg/m. General appearance: alert, cooperative, no distress, and mildly obese Abdomen: soft, non-tender; bowel sounds normal; no masses,  no organomegaly Pelvic: deferred. Patein here for self swab. She plans on getting a GYN PE soon Lungs: CTA bilaterally Heart:  RRR Extremities: extremities normal, atraumatic, no cyanosis or edema Neurologic: Grossly normal   Assessment:   Vaginal odor for last few days. History of recurrent BV Overdue for Annual Gyn PE/pap   Plan:   Aptima swab sent. Samples of vaginal ph med provided. AS she has a hx of BV will order Rx of Metronidazole. Will call her with the results of the Aptima.  Encouraged her to RTC for a GYN physical , mammogram order and pap smear. \ Catherine Giles, CNM  07/02/2023 4:51 PM

## 2023-07-04 ENCOUNTER — Encounter: Payer: Self-pay | Admitting: Obstetrics

## 2023-07-04 LAB — CERVICOVAGINAL ANCILLARY ONLY
Bacterial Vaginitis (gardnerella): NEGATIVE
Candida Glabrata: NEGATIVE
Candida Vaginitis: NEGATIVE
Comment: NEGATIVE
Comment: NEGATIVE
Comment: NEGATIVE
Comment: NEGATIVE
Trichomonas: NEGATIVE

## 2023-07-20 NOTE — Progress Notes (Deleted)
ANNUAL PREVENTATIVE CARE GYNECOLOGY  ENCOUNTER NOTE  Subjective:       Catherine Giles is a 52 y.o. G74P0030 female here for a routine annual gynecologic exam. The patient {is/is not/has never been:13135} sexually active. The patient {is/is not:13135} taking hormone replacement therapy. {post-men bleed:13152::"Patient denies post-menopausal vaginal bleeding."} The patient wears seatbelts: {yes/no:311178}. The patient participates in regular exercise: {yes/no/not asked:9010}. Has the patient ever been transfused or tattooed?: {yes/no/not asked:9010}. The patient reports that there {is/is not:9024} domestic violence in her life.  Current complaints: 1.  ***    Gynecologic History No LMP recorded. Patient is postmenopausal. Contraception: post menopausal status Last Pap: ***. Results were: {norm/abn:16337} Last mammogram: ***. Results were: {norm/abn:16337} Last Colonoscopy:  Last Dexa Scan:    Obstetric History OB History  Gravida Para Term Preterm AB Living  9 6 0 0 3    SAB IAB Ectopic Multiple Live Births  2 0 0        # Outcome Date GA Lbr Len/2nd Weight Sex Type Anes PTL Lv  9 AB           8 SAB           7 SAB           6 Para           5 Para           4 Para           3 Para           2 Para           1 Para             Past Medical History:  Diagnosis Date   Arthritis    RA   Asthma    COPD (chronic obstructive pulmonary disease) (HCC)    Depression    Diabetes mellitus without complication (HCC)    Emphysema of lung (HCC)    Family history of breast cancer    6/21 cancer genetic testing letter sent   Fibromyalgia    Hypertension    Lupus (HCC)    Pre-diabetes    Thyroid disease     Family History  Problem Relation Age of Onset   Lung cancer Mother    Diabetes Mother    Breast cancer Mother 81   Hypertension Father    Cancer Maternal Aunt    Ovarian cancer Maternal Aunt    Diabetes Maternal Aunt    Diabetes Maternal Uncle    Hypertension  Maternal Grandmother    Diabetes Maternal Grandmother     Past Surgical History:  Procedure Laterality Date   CHOLECYSTECTOMY, LAPAROSCOPIC  02/2022   MOUTH SURGERY     TRANSTHORACIC ECHOCARDIOGRAM  04/2020   EF 60 to 65%.  Normal LV function.  No R WMA.  GR 1 DD.  Normal RV size and function.  Normal PAP.  Normal atrial size.  Normal valves.    Social History   Socioeconomic History   Marital status: Married    Spouse name: Not on file   Number of children: Not on file   Years of education: Not on file   Highest education level: Not on file  Occupational History   Not on file  Tobacco Use   Smoking status: Every Day    Current packs/day: 1.00    Average packs/day: 1 pack/day for 34.0 years (34.0 ttl pk-yrs)    Types: Cigarettes   Smokeless tobacco: Never  Tobacco comments:    2-3 cig/day  Vaping Use   Vaping status: Never Used  Substance and Sexual Activity   Alcohol use: Yes    Comment: Occ   Drug use: Not Currently   Sexual activity: Yes  Other Topics Concern   Not on file  Social History Narrative   ** Merged History Encounter **       Social Determinants of Health   Financial Resource Strain: Medium Risk (04/15/2021)   Overall Financial Resource Strain (CARDIA)    Difficulty of Paying Living Expenses: Somewhat hard  Food Insecurity: Patient Declined (10/07/2022)   Hunger Vital Sign    Worried About Running Out of Food in the Last Year: Patient declined    Ran Out of Food in the Last Year: Patient declined  Transportation Needs: No Transportation Needs (10/07/2022)   PRAPARE - Administrator, Civil Service (Medical): No    Lack of Transportation (Non-Medical): No  Physical Activity: Inactive (04/15/2021)   Exercise Vital Sign    Days of Exercise per Week: 0 days    Minutes of Exercise per Session: 0 min  Stress: Stress Concern Present (05/27/2021)   [employer] of Occupational Health - Occupational Stress Questionnaire    Feeling of  Stress : To some extent  Social Connections: Moderately Isolated (05/27/2021)   Social Connection and Isolation Panel [NHANES]    Frequency of Communication with Friends and Family: More than three times a week    Frequency of Social Gatherings with Friends and Family: More than three times a week    Attends Religious Services: Never    Database administrator or Organizations: No    Attends Banker Meetings: Never    Marital Status: Married  Catering manager Violence: Patient Declined (10/07/2022)   Humiliation, Afraid, Rape, and Kick questionnaire    Fear of Current or Ex-Partner: Patient declined    Emotionally Abused: Patient declined    Physically Abused: Patient declined    Sexually Abused: Patient declined    Current Outpatient Medications on File Prior to Visit  Medication Sig Dispense Refill   Acidophilus Lactobacillus CAPS Take 1 tablet by mouth daily.     albuterol (VENTOLIN HFA) 108 (90 Base) MCG/ACT inhaler      amLODipine (NORVASC) 10 MG tablet Take 1 tablet (10 mg total) by mouth daily. 90 tablet 4   Blood Glucose Monitoring Suppl (ONETOUCH VERIO) w/Device KIT Use to check blood sugar 3 times daily with goal <130 fasting and <180 two hours after meal.  Bring meter to visits. 1 kit 0   cetirizine (ZYRTEC) 10 MG tablet Take 1 tablet (10 mg total) by mouth daily. 90 tablet 11   cloNIDine (CATAPRES) 0.1 MG tablet TAKE 1 TABLET BY MOUTH TWICE DAILY 60 tablet 0   cyclobenzaprine (FLEXERIL) 5 MG tablet Take 1 tablet every day by oral route at bedtime.     dapagliflozin propanediol (FARXIGA) 10 MG TABS tablet Take 1 tablet (10 mg total) by mouth daily before breakfast. 90 tablet 4   EPINEPHrine 0.3 mg/0.3 mL IJ SOAJ injection Inject 0.3 mg into the muscle as needed for anaphylaxis. 1 each 4   glucose blood test strip Use to check blood sugar 3 times daily with goal <130 fasting and <180 two hours after meal. 100 each 12   golimumab (SIMPONI ARIA) 50 MG/4ML SOLN injection  Inject into the vein.     guaiFENesin-codeine (ROBITUSSIN AC) 100-10 MG/5ML syrup Take 10 mLs by mouth  3 (three) times daily as needed for cough or congestion. 120 mL 0   hydrochlorothiazide (HYDRODIURIL) 25 MG tablet Take 25 mg by mouth daily.     hydrOXYzine (VISTARIL) 25 MG capsule TAKE 1 CAPSULE BY MOUTH EVERY 8 HOURS ASNEEDED 60 capsule 0   ibuprofen (ADVIL) 600 MG tablet Take 1 tablet (600 mg total) by mouth every 8 (eight) hours as needed. 30 tablet 0   ipratropium (ATROVENT) 0.06 % nasal spray Place 2 sprays into both nostrils 4 (four) times daily. 15 mL 12   ipratropium-albuterol (DUONEB) 0.5-2.5 (3) MG/3ML SOLN Take 3 mLs by nebulization every 6 (six) hours as needed for up to 7 days. 360 mL 0   Lancets (ONETOUCH ULTRASOFT) lancets Use to check blood sugar 3 times daily with goal <130 fasting and <180 two hours after meal. 100 each 12   Lancets (ONETOUCH ULTRASOFT) lancets Use as directed 60 each 5   leflunomide (ARAVA) 20 MG tablet Take 20 mg by mouth daily.     levothyroxine (SYNTHROID) 50 MCG tablet Take 50 mcg by mouth daily before breakfast.      losartan (COZAAR) 100 MG tablet Take 100 mg by mouth daily.     metFORMIN (GLUCOPHAGE-XR) 500 MG 24 hr tablet      montelukast (SINGULAIR) 10 MG tablet Take 10 mg by mouth daily.     nicotine polacrilex (NICOTINE MINI) 4 MG lozenge Take 1 lozenge (4 mg total) by mouth as needed for smoking cessation. 100 tablet 0   ondansetron (ZOFRAN-ODT) 4 MG disintegrating tablet Take 1 tablet (4 mg total) by mouth every 8 (eight) hours as needed for nausea or vomiting. 20 tablet 0   oxyCODONE (ROXICODONE) 5 MG immediate release tablet Take 1 tablet (5 mg total) by mouth every 6 (six) hours as needed for up to 12 doses for severe pain. 12 tablet 0   polyethylene glycol (MIRALAX) 17 g packet Take 17 g by mouth daily. 14 each 0   predniSONE (DELTASONE) 10 MG tablet Take 6 tabs p.o. on day 1 and decrease by 1 tablet daily until complete 21 tablet 0    TRELEGY ELLIPTA 200-62.5-25 MCG/ACT AEPB Inhale 1 puff into the lungs daily.     [DISCONTINUED] traZODone (DESYREL) 50 MG tablet Take 1 tablet (50 mg total) by mouth at bedtime. 30 tablet 3   No current facility-administered medications on file prior to visit.    Allergies  Allergen Reactions   Azithromycin Anaphylaxis   Molds & Smuts Cough   Alpha-Gal Other (See Comments)   Penicillins     Did it involve swelling of the face/tongue/throat, SOB, or low BP? Yes Did it involve sudden or severe rash/hives, skin peeling, or any reaction on the inside of your mouth or nose? No Did you need to seek medical attention at a hospital or doctor's office? No When did it last happen?       If all above answers are "NO", may proceed with cephalosporin use.    Shellfish Allergy       Review of Systems ROS Review of Systems - General ROS: negative for - chills, fatigue, fever, hot flashes, night sweats, weight gain or weight loss Psychological ROS: negative for - anxiety, decreased libido, depression, mood swings, physical abuse or sexual abuse Ophthalmic ROS: negative for - blurry vision, eye pain or loss of vision ENT ROS: negative for - headaches, hearing change, visual changes or vocal changes Allergy and Immunology ROS: negative for - hives, itchy/watery eyes or  seasonal allergies Hematological and Lymphatic ROS: negative for - bleeding problems, bruising, swollen lymph nodes or weight loss Endocrine ROS: negative for - galactorrhea, hair pattern changes, hot flashes, malaise/lethargy, mood swings, palpitations, polydipsia/polyuria, skin changes, temperature intolerance or unexpected weight changes Breast ROS: negative for - new or changing breast lumps or nipple discharge Respiratory ROS: negative for - cough or shortness of breath Cardiovascular ROS: negative for - chest pain, irregular heartbeat, palpitations or shortness of breath Gastrointestinal ROS: no abdominal pain, change in bowel  habits, or black or bloody stools Genito-Urinary ROS: no dysuria, trouble voiding, or hematuria Musculoskeletal ROS: negative for - joint pain or joint stiffness Neurological ROS: negative for - bowel and bladder control changes Dermatological ROS: negative for rash and skin lesion changes   Objective:   There were no vitals taken for this visit. CONSTITUTIONAL: Well-developed, well-nourished female in no acute distress.  PSYCHIATRIC: Normal mood and affect. Normal behavior. Normal judgment and thought content. NEUROLGIC: Alert and oriented to person, place, and time. Normal muscle tone coordination. No cranial nerve deficit noted. HENT:  Normocephalic, atraumatic, External right and left ear normal. Oropharynx is clear and moist EYES: Conjunctivae and EOM are normal. Pupils are equal, round, and reactive to light. No scleral icterus.  NECK: Normal range of motion, supple, no masses.  Normal thyroid.  SKIN: Skin is warm and dry. No rash noted. Not diaphoretic. No erythema. No pallor. CARDIOVASCULAR: Normal heart rate noted, regular rhythm, no murmur. RESPIRATORY: Clear to auscultation bilaterally. Effort and breath sounds normal, no problems with respiration noted. BREASTS: Symmetric in size. No masses, skin changes, nipple drainage, or lymphadenopathy. ABDOMEN: Soft, normal bowel sounds, no distention noted.  No tenderness, rebound or guarding.  BLADDER: Normal PELVIC:  Bladder {:311640}  Urethra: {:311719}  Vulva: {:311722}  Vagina: {:311643}  Cervix: {:311644}  Uterus: {:311718}  Adnexa: {:311645}  RV: {Blank multiple:19196::"External Exam NormaI","No Rectal Masses","Normal Sphincter tone"}  MUSCULOSKELETAL: Normal range of motion. No tenderness.  No cyanosis, clubbing, or edema.  2+ distal pulses. LYMPHATIC: No Axillary, Supraclavicular, or Inguinal Adenopathy.   Labs: Lab Results  Component Value Date   WBC 10.7 (H) 03/21/2023   HGB 14.5 03/21/2023   HCT 44.9 03/21/2023    MCV 96.8 03/21/2023   PLT 349 03/21/2023    Lab Results  Component Value Date   CREATININE 0.69 03/21/2023   BUN 16 03/21/2023   NA 141 03/21/2023   K 3.6 03/21/2023   CL 107 03/21/2023   CO2 23 03/21/2023    Lab Results  Component Value Date   ALT 19 03/21/2023   AST 30 03/21/2023   ALKPHOS 69 03/21/2023   BILITOT <0.1 (L) 03/21/2023    Lab Results  Component Value Date   CHOL 156 12/02/2021   HDL 73 12/02/2021   LDLCALC 66 12/02/2021   TRIG 94 12/02/2021    Lab Results  Component Value Date   TSH 1.520 12/02/2021    Lab Results  Component Value Date   HGBA1C 6.5 (H) 12/02/2021     Assessment:   No diagnosis found.   Plan:  Pap: {Blank multiple:19196::"Pap, Reflex if ASCUS","Pap Co Test","GC/CT NAAT","Not needed","Not done"} Mammogram: {Blank multiple:19196::"***","Ordered","Not Ordered","Not Indicated"} Colon Screening:  {Blank multiple:19196::"***","Ordered","Not Ordered","Not Indicated"} Labs: {Blank multiple:19196::"Lipid 1","FBS","TSH","Hemoglobin A1C","Vit D Level""***"} Routine preventative health maintenance measures emphasized: {Blank multiple:19196::"Exercise/Diet/Weight control","Tobacco Warnings","Alcohol/Substance use risks","Stress Management","Peer Pressure Issues","Safe Sex"} COVID Vaccination status: Return to Clinic - 1 Year   [facility], [place] [facility]

## 2023-07-23 ENCOUNTER — Ambulatory Visit: Payer: 59 | Admitting: Obstetrics

## 2023-07-23 DIAGNOSIS — Z01419 Encounter for gynecological examination (general) (routine) without abnormal findings: Secondary | ICD-10-CM

## 2023-07-23 DIAGNOSIS — Z13220 Encounter for screening for lipoid disorders: Secondary | ICD-10-CM

## 2023-07-23 DIAGNOSIS — Z1231 Encounter for screening mammogram for malignant neoplasm of breast: Secondary | ICD-10-CM

## 2023-07-23 DIAGNOSIS — Z131 Encounter for screening for diabetes mellitus: Secondary | ICD-10-CM

## 2023-07-23 DIAGNOSIS — Z1211 Encounter for screening for malignant neoplasm of colon: Secondary | ICD-10-CM

## 2023-08-08 ENCOUNTER — Other Ambulatory Visit: Payer: Self-pay | Admitting: Nurse Practitioner

## 2023-08-08 DIAGNOSIS — Z1231 Encounter for screening mammogram for malignant neoplasm of breast: Secondary | ICD-10-CM

## 2023-08-13 ENCOUNTER — Other Ambulatory Visit: Payer: Self-pay

## 2023-08-13 ENCOUNTER — Emergency Department: Payer: 59

## 2023-08-13 DIAGNOSIS — Z5321 Procedure and treatment not carried out due to patient leaving prior to being seen by health care provider: Secondary | ICD-10-CM | POA: Insufficient documentation

## 2023-08-13 DIAGNOSIS — E876 Hypokalemia: Secondary | ICD-10-CM | POA: Insufficient documentation

## 2023-08-13 DIAGNOSIS — Z122 Encounter for screening for malignant neoplasm of respiratory organs: Secondary | ICD-10-CM | POA: Insufficient documentation

## 2023-08-13 DIAGNOSIS — J449 Chronic obstructive pulmonary disease, unspecified: Secondary | ICD-10-CM | POA: Insufficient documentation

## 2023-08-13 DIAGNOSIS — I10 Essential (primary) hypertension: Secondary | ICD-10-CM | POA: Insufficient documentation

## 2023-08-13 DIAGNOSIS — R059 Cough, unspecified: Secondary | ICD-10-CM | POA: Insufficient documentation

## 2023-08-13 DIAGNOSIS — R062 Wheezing: Secondary | ICD-10-CM | POA: Insufficient documentation

## 2023-08-13 DIAGNOSIS — J189 Pneumonia, unspecified organism: Secondary | ICD-10-CM | POA: Insufficient documentation

## 2023-08-13 DIAGNOSIS — R071 Chest pain on breathing: Secondary | ICD-10-CM | POA: Insufficient documentation

## 2023-08-13 DIAGNOSIS — E119 Type 2 diabetes mellitus without complications: Secondary | ICD-10-CM | POA: Insufficient documentation

## 2023-08-13 DIAGNOSIS — F17210 Nicotine dependence, cigarettes, uncomplicated: Secondary | ICD-10-CM | POA: Insufficient documentation

## 2023-08-13 DIAGNOSIS — E039 Hypothyroidism, unspecified: Secondary | ICD-10-CM | POA: Insufficient documentation

## 2023-08-13 NOTE — ED Triage Notes (Signed)
EMS brings pt in from home for c/o cough and wheezing several days; hx COPD, used duoneb at home PTA; O2 at 2l/min via [place] at home (baseline)

## 2023-08-13 NOTE — ED Triage Notes (Signed)
Patient wheeled to triage with complaints of severe chest pain and difficulty breathing. Patient is extremely upset and crying during triage stating the pain is so bad and she feels she may have pneumonia. Patient just keeps repeating to 'please help me' during triage process. EKG and labs obtained.

## 2023-08-14 ENCOUNTER — Other Ambulatory Visit (HOSPITAL_COMMUNITY)
Admission: RE | Admit: 2023-08-14 | Discharge: 2023-08-14 | Disposition: A | Discharge status: Home / Self Care | Payer: 59 | Source: Ambulatory Visit | Attending: Obstetrics | Admitting: Obstetrics

## 2023-08-14 ENCOUNTER — Emergency Department
Admission: EM | Admit: 2023-08-14 | Discharge: 2023-08-15 | Disposition: A | Discharge status: Home / Self Care | Payer: 59 | Source: Home / Self Care | Attending: Emergency Medicine | Admitting: Emergency Medicine

## 2023-08-14 ENCOUNTER — Ambulatory Visit: Payer: 59 | Admitting: Obstetrics

## 2023-08-14 ENCOUNTER — Encounter: Payer: Self-pay | Admitting: Obstetrics

## 2023-08-14 ENCOUNTER — Emergency Department
Admission: EM | Admit: 2023-08-14 | Discharge: 2023-08-14 | Discharge status: Against Medical Advice | Payer: 59 | Attending: Emergency Medicine | Admitting: Emergency Medicine

## 2023-08-14 ENCOUNTER — Other Ambulatory Visit: Payer: Self-pay

## 2023-08-14 ENCOUNTER — Emergency Department: Payer: 59

## 2023-08-14 ENCOUNTER — Ambulatory Visit
Admission: RE | Admit: 2023-08-14 | Discharge: 2023-08-14 | Disposition: A | Discharge status: Home / Self Care | Payer: 59 | Source: Ambulatory Visit | Attending: Nurse Practitioner | Admitting: Nurse Practitioner

## 2023-08-14 VITALS — BP 164/100 | HR 98 | Ht 63.0 in | Wt 171.0 lb

## 2023-08-14 DIAGNOSIS — Z124 Encounter for screening for malignant neoplasm of cervix: Secondary | ICD-10-CM

## 2023-08-14 DIAGNOSIS — F17210 Nicotine dependence, cigarettes, uncomplicated: Secondary | ICD-10-CM

## 2023-08-14 DIAGNOSIS — Z122 Encounter for screening for malignant neoplasm of respiratory organs: Secondary | ICD-10-CM | POA: Insufficient documentation

## 2023-08-14 DIAGNOSIS — J449 Chronic obstructive pulmonary disease, unspecified: Secondary | ICD-10-CM | POA: Insufficient documentation

## 2023-08-14 DIAGNOSIS — J189 Pneumonia, unspecified organism: Secondary | ICD-10-CM | POA: Insufficient documentation

## 2023-08-14 DIAGNOSIS — N898 Other specified noninflammatory disorders of vagina: Secondary | ICD-10-CM

## 2023-08-14 DIAGNOSIS — E119 Type 2 diabetes mellitus without complications: Secondary | ICD-10-CM | POA: Insufficient documentation

## 2023-08-14 DIAGNOSIS — E876 Hypokalemia: Secondary | ICD-10-CM | POA: Insufficient documentation

## 2023-08-14 DIAGNOSIS — Z01419 Encounter for gynecological examination (general) (routine) without abnormal findings: Secondary | ICD-10-CM | POA: Diagnosis present

## 2023-08-14 DIAGNOSIS — Z87891 Personal history of nicotine dependence: Secondary | ICD-10-CM | POA: Insufficient documentation

## 2023-08-14 DIAGNOSIS — E039 Hypothyroidism, unspecified: Secondary | ICD-10-CM | POA: Insufficient documentation

## 2023-08-14 DIAGNOSIS — Z1151 Encounter for screening for human papillomavirus (HPV): Secondary | ICD-10-CM | POA: Diagnosis not present

## 2023-08-14 DIAGNOSIS — I10 Essential (primary) hypertension: Secondary | ICD-10-CM | POA: Insufficient documentation

## 2023-08-14 DIAGNOSIS — Z0000 Encounter for general adult medical examination without abnormal findings: Secondary | ICD-10-CM

## 2023-08-14 LAB — BASIC METABOLIC PANEL
Anion gap: 11 (ref 5–15)
Anion gap: 13 (ref 5–15)
BUN: 10 mg/dL (ref 6–20)
BUN: 17 mg/dL (ref 6–20)
CO2: 23 mmol/L (ref 22–32)
CO2: 25 mmol/L (ref 22–32)
Calcium: 9.4 mg/dL (ref 8.9–10.3)
Calcium: 9.4 mg/dL (ref 8.9–10.3)
Chloride: 102 mmol/L (ref 98–111)
Chloride: 105 mmol/L (ref 98–111)
Creatinine, Ser: 0.48 mg/dL (ref 0.44–1.00)
Creatinine, Ser: 0.49 mg/dL (ref 0.44–1.00)
GFR, Estimated: >60 mL/min (ref >60)
GFR, Estimated: >60 mL/min (ref >60)
Glucose, Bld: 146 mg/dL — ABNORMAL HIGH (ref 70–99)
Glucose, Bld: 183 mg/dL — ABNORMAL HIGH (ref 70–99)
Potassium: 2.8 mmol/L — ABNORMAL LOW (ref 3.5–5.1)
Potassium: 3 mmol/L — ABNORMAL LOW (ref 3.5–5.1)
Sodium: 139 mmol/L (ref 135–145)
Sodium: 140 mmol/L (ref 135–145)

## 2023-08-14 LAB — CBC
HCT: 46.3 % — ABNORMAL HIGH (ref 36.0–46.0)
HCT: 47.2 % — ABNORMAL HIGH (ref 36.0–46.0)
Hemoglobin: 15 g/dL (ref 12.0–15.0)
Hemoglobin: 15.3 g/dL — ABNORMAL HIGH (ref 12.0–15.0)
MCH: 30.7 pg (ref 26.0–34.0)
MCH: 31.4 pg (ref 26.0–34.0)
MCHC: 32.4 g/dL (ref 30.0–36.0)
MCHC: 32.4 g/dL (ref 30.0–36.0)
MCV: 94.7 fL (ref 80.0–100.0)
MCV: 96.9 fL (ref 80.0–100.0)
Platelets: 109 10*3/uL — ABNORMAL LOW (ref 150–400)
Platelets: 361 10*3/uL (ref 150–400)
RBC: 4.87 MIL/uL (ref 3.87–5.11)
RBC: 4.89 MIL/uL (ref 3.87–5.11)
RDW: 13.8 % (ref 11.5–15.5)
RDW: 14 % (ref 11.5–15.5)
WBC: 11.1 10*3/uL — ABNORMAL HIGH (ref 4.0–10.5)
WBC: 9.7 10*3/uL (ref 4.0–10.5)
nRBC: 0 % (ref 0.0–0.2)
nRBC: 0 % (ref 0.0–0.2)

## 2023-08-14 LAB — TROPONIN I (HIGH SENSITIVITY)
Troponin I (High Sensitivity): 4 ng/L (ref <18)
Troponin I (High Sensitivity): 4 ng/L (ref <18)
Troponin I (High Sensitivity): 4 ng/L (ref <18)

## 2023-08-14 LAB — D-DIMER, QUANTITATIVE: D-Dimer, Quant: 4.72 ug{FEU}/mL — ABNORMAL HIGH (ref 0.00–0.50)

## 2023-08-14 MED ORDER — ONDANSETRON HCL 4 MG/2ML IJ SOLN
4.0000 mg | Freq: Once | INTRAMUSCULAR | Status: AC
Start: 2023-08-14 — End: 2023-08-14
  Administered 2023-08-14: 4 mg via INTRAVENOUS
  Filled 2023-08-14: qty 2

## 2023-08-14 MED ORDER — MORPHINE SULFATE (PF) 4 MG/ML IV SOLN
4.0000 mg | Freq: Once | INTRAVENOUS | Status: AC
  Administered 2023-08-14: 4 mg via INTRAVENOUS
  Filled 2023-08-14: qty 1

## 2023-08-14 MED ORDER — IPRATROPIUM-ALBUTEROL 0.5-2.5 (3) MG/3ML IN SOLN
3.0000 mL | Freq: Once | RESPIRATORY_TRACT | Status: AC
  Administered 2023-08-14: 3 mL via RESPIRATORY_TRACT
  Filled 2023-08-14: qty 3

## 2023-08-14 MED ORDER — IOHEXOL 350 MG/ML SOLN
100.0000 mL | Freq: Once | INTRAVENOUS | Status: AC | PRN
  Administered 2023-08-14: 100 mL via INTRAVENOUS

## 2023-08-14 MED ORDER — ACETAMINOPHEN 325 MG PO TABS
650.0000 mg | ORAL_TABLET | Freq: Once | ORAL | Status: AC
Start: 2023-08-14 — End: 2023-08-14
  Administered 2023-08-14: 650 mg via ORAL
  Filled 2023-08-14: qty 2

## 2023-08-14 MED ORDER — POTASSIUM CHLORIDE CRYS ER 20 MEQ PO TBCR
40.0000 meq | EXTENDED_RELEASE_TABLET | Freq: Once | ORAL | Status: AC
  Administered 2023-08-14: 40 meq via ORAL
  Filled 2023-08-14: qty 2

## 2023-08-14 NOTE — ED Notes (Signed)
No answer when called several times from lobby 

## 2023-08-14 NOTE — ED Triage Notes (Signed)
Pt presents to the ED pov from home by self. Pt reports left sided chest pain that started yesterday. Pt was seen here for same yesterday, but reports that the pain seems to be worsening. Pt does have a hx of COPD. Pt also reports fatigue and nauseas. Pt A&Ox4 at time of triage.

## 2023-08-14 NOTE — Progress Notes (Signed)
Catherine Giles OB GYN    Gynecology Annual Exam  PCP: [facility]  Chief Complaint:  Chief Complaint  Patient presents with   Annual Exam    History of Present Illness:Patient is a 52 y.o. G9P0030 presents for annual exam. The patient has no complaints today. She has been seen for problem visits , and this is her first GYN exam. She is married and has had 6  children and 3 SABs historically. Her health history is noted for CHTN, COPD, CHD, RA, high BMI, Hashimotos, Type II diabetes.  She reports infrequent intercourse, and reports some dyspareunia. She has not sought help with this in the past.  A review of her chart reveals that she was seen last evening and into the early hours this morning at the [facility] ED for SOB, chest pain and generally feeling unwell. She shares that she left without finishing being seen. She also has not taken her daily medication for hypertension. She really wanted to have her Annual GYN physical, so she presented today.is hypertensive today. LMP: No LMP recorded. Patient is postmenopausal. Menarche:9  The patient is infrequently sexually active. She admits to dyspareunia.  The patient does perform self breast exams.  There is notable family history of breast or ovarian cancer in her family.  The patient wears seatbelts: yes.   The patient has regular exercise: no.    The patient denies current symptoms of depression.     Review of Systems: ROS  Past Medical History:  Patient Active Problem List   Diagnosis Date Noted Date Diagnosed   Hemoptysis 06/04/2023    Bronchopneumonia 10/09/2022    Influenza A 10/07/2022    Depression with anxiety 10/07/2022    COPD exacerbation (HCC) 10/07/2022    HTN (hypertension) 10/07/2022    Tobacco abuse 10/07/2022    Chest pain 09/10/2022    COPD (chronic obstructive pulmonary disease) (HCC)     Hypothyroidism     Nicotine dependence, cigarettes, uncomplicated     Chronic diastolic CHF  (congestive heart failure) (HCC)     Sepsis (HCC)     Allergy to alpha-gal 01/07/2022    Depression, major, single episode, mild (HCC) 01/24/2021    Dairy allergy 10/12/2020    Vaginal stenosis 03/19/2020    Obesity 03/19/2020    Insomnia 01/05/2020    Complex regional pain syndrome I of upper limb 12/17/2019    Hypertension associated with diabetes (HCC) 12/13/2019    Type II diabetes mellitus with complication (HCC) 12/13/2019     6.7% on 01/25/2021 -- Metformin started BID    Hashimoto's thyroiditis 12/13/2019     Sees Dr. Gershon Crane    DDD (degenerative disc disease), lumbar 08/26/2019    Multinodular goiter 08/14/2019    Chronic left hip pain 05/05/2019    Osteoarthritis of spine with radiculopathy, cervical region 05/05/2019    Polypharmacy 02/14/2019     PLAQUENIL. CIMZIA    Fibromyalgia 02/10/2019    Rheumatoid arthritis involving multiple sites with positive rheumatoid factor (HCC) 02/10/2019     Sees Dr. Tresa Moore    Cervical radiculopathy 03/13/2018    Vitamin D deficiency 10/11/2015     8.7 on 10/12/20     Past Surgical History:  Past Surgical History:  Procedure Laterality Date   CHOLECYSTECTOMY, LAPAROSCOPIC  02/2022   MOUTH SURGERY     TRANSTHORACIC ECHOCARDIOGRAM  04/2020   EF 60 to 65%.  Normal LV function.  No R WMA.  GR 1 DD.  Normal RV size  and function.  Normal PAP.  Normal atrial size.  Normal valves.    Gynecologic History:  No LMP recorded. Patient is postmenopausal. Last Pap: Results were: 2021 no abnormalities  Last mammogram: ordered by her PCP Results were: BI-RAD II  Obstetric History: G9P0030  Family History:  Family History  Problem Relation Age of Onset   Lung cancer Mother    Diabetes Mother    Breast cancer Mother 9   Hypertension Father    Cancer Maternal Aunt    Ovarian cancer Maternal Aunt    Diabetes Maternal Aunt    Diabetes Maternal Uncle    Hypertension Maternal Grandmother    Diabetes Maternal Grandmother      Social History:  Social History   Socioeconomic History   Marital status: Married    Spouse name: Not on file   Number of children: Not on file   Years of education: Not on file   Highest education level: Not on file  Occupational History   Not on file  Tobacco Use   Smoking status: Every Day    Current packs/day: 1.00    Average packs/day: 1 pack/day for 34.0 years (34.0 ttl pk-yrs)    Types: Cigarettes   Smokeless tobacco: Never   Tobacco comments:    2-3 cig/day  Vaping Use   Vaping status: Never Used  Substance and Sexual Activity   Alcohol use: Yes    Comment: Occ   Drug use: Not Currently   Sexual activity: Yes  Other Topics Concern   Not on file  Social History Narrative   ** Merged History Encounter **       Social Determinants of Health   Financial Resource Strain: Medium Risk (04/15/2021)   Overall Financial Resource Strain (CARDIA)    Difficulty of Paying Living Expenses: Somewhat hard  Food Insecurity: Patient Declined (10/07/2022)   Hunger Vital Sign    Worried About Running Out of Food in the Last Year: Patient declined    Ran Out of Food in the Last Year: Patient declined  Transportation Needs: No Transportation Needs (10/07/2022)   PRAPARE - Administrator, Civil Service (Medical): No    Lack of Transportation (Non-Medical): No  Physical Activity: Inactive (04/15/2021)   Exercise Vital Sign    Days of Exercise per Week: 0 days    Minutes of Exercise per Session: 0 min  Stress: Stress Concern Present (05/27/2021)   [employer] of Occupational Health - Occupational Stress Questionnaire    Feeling of Stress : To some extent  Social Connections: Moderately Isolated (05/27/2021)   Social Connection and Isolation Panel [NHANES]    Frequency of Communication with Friends and Family: More than three times a week    Frequency of Social Gatherings with Friends and Family: More than three times a week    Attends Religious Services: Never     Database administrator or Organizations: No    Attends Banker Meetings: Never    Marital Status: Married  Catering manager Violence: Patient Declined (10/07/2022)   Humiliation, Afraid, Rape, and Kick questionnaire    Fear of Current or Ex-Partner: Patient declined    Emotionally Abused: Patient declined    Physically Abused: Patient declined    Sexually Abused: Patient declined    Allergies:  Allergies  Allergen Reactions   Azithromycin Anaphylaxis   Molds & Smuts Cough   Alpha-Gal Other (See Comments)   Penicillins     Did it involve swelling of the  face/tongue/throat, SOB, or low BP? Yes Did it involve sudden or severe rash/hives, skin peeling, or any reaction on the inside of your mouth or nose? No Did you need to seek medical attention at a hospital or doctor's office? No When did it last happen?       If all above answers are "NO", may proceed with cephalosporin use.    Shellfish Allergy     Medications: Prior to Admission medications   Medication Sig Start Date End Date Taking? Authorizing Provider  Acidophilus Lactobacillus CAPS Take 1 tablet by mouth daily.    [provider]  albuterol (VENTOLIN HFA) 108 (90 Base) MCG/ACT inhaler     [provider]  amLODipine (NORVASC) 10 MG tablet Take 1 tablet (10 mg total) by mouth daily. 09/05/21   Cannady, Corrie Dandy T, NP  Blood Glucose Monitoring Suppl (ONETOUCH VERIO) w/Device KIT Use to check blood sugar 3 times daily with goal <130 fasting and <180 two hours after meal.  Bring meter to visits. 01/25/21   Cannady, Corrie Dandy T, NP  cetirizine (ZYRTEC) 10 MG tablet Take 1 tablet (10 mg total) by mouth daily. 01/03/22   Gabriel Cirri, NP  cloNIDine (CATAPRES) 0.1 MG tablet TAKE 1 TABLET BY MOUTH TWICE DAILY 05/23/23   Antonieta Iba, MD  cyclobenzaprine (FLEXERIL) 5 MG tablet Take 1 tablet every day by oral route at bedtime. 05/16/23   [provider]  dapagliflozin propanediol (FARXIGA) 10  MG TABS tablet Take 1 tablet (10 mg total) by mouth daily before breakfast. 03/16/21   Cannady, Corrie Dandy T, NP  EPINEPHrine 0.3 mg/0.3 mL IJ SOAJ injection Inject 0.3 mg into the muscle as needed for anaphylaxis. 02/15/22   Aura Dials T, NP  glucose blood test strip Use to check blood sugar 3 times daily with goal <130 fasting and <180 two hours after meal. 01/25/21   Cannady, Jolene T, NP  golimumab (SIMPONI ARIA) 50 MG/4ML SOLN injection Inject into the vein.    [provider]  guaiFENesin-codeine (ROBITUSSIN AC) 100-10 MG/5ML syrup Take 10 mLs by mouth 3 (three) times daily as needed for cough or congestion. 06/19/23   Shirlee Latch, PA-C  hydrochlorothiazide (HYDRODIURIL) 25 MG tablet Take 25 mg by mouth daily. 01/22/23   [provider]  hydrOXYzine (VISTARIL) 25 MG capsule TAKE 1 CAPSULE BY MOUTH EVERY 8 HOURS ASNEEDED 01/03/22   Gabriel Cirri, NP  ibuprofen (ADVIL) 600 MG tablet Take 1 tablet (600 mg total) by mouth every 8 (eight) hours as needed. 10/10/22   Enedina Finner, MD  ipratropium (ATROVENT) 0.06 % nasal spray Place 2 sprays into both nostrils 4 (four) times daily. 03/20/23   Becky Augusta, NP  ipratropium-albuterol (DUONEB) 0.5-2.5 (3) MG/3ML SOLN Take 3 mLs by nebulization every 6 (six) hours as needed for up to 7 days. 10/30/21 03/20/23  Eusebio Friendly B, PA-C  Lancets [facility] ULTRASOFT) lancets Use to check blood sugar 3 times daily with goal <130 fasting and <180 two hours after meal. 01/25/21   Aura Dials T, NP  Lancets (ONETOUCH ULTRASOFT) lancets Use as directed 03/21/23   Pilar Jarvis, MD  leflunomide (ARAVA) 20 MG tablet Take 20 mg by mouth daily. 01/22/21   [provider]  levothyroxine (SYNTHROID) 50 MCG tablet Take 50 mcg by mouth daily before breakfast.     [provider]  losartan (COZAAR) 100 MG tablet Take 100 mg by mouth daily.    [provider]  metFORMIN (GLUCOPHAGE-XR) 500 MG 24 hr tablet  05/08/23   [provider]   montelukast (SINGULAIR) 10 MG tablet Take 10 mg by mouth daily. 02/15/22   [provider]  nicotine polacrilex (NICOTINE MINI) 4 MG lozenge Take 1 lozenge (4 mg total) by mouth as needed for smoking cessation. 02/13/23   Pilar Jarvis, MD  ondansetron (ZOFRAN-ODT) 4 MG disintegrating tablet Take 1 tablet (4 mg total) by mouth every 8 (eight) hours as needed for nausea or vomiting. 12/25/22   Minna Antis, MD  oxyCODONE (ROXICODONE) 5 MG immediate release tablet Take 1 tablet (5 mg total) by mouth every 6 (six) hours as needed for up to 12 doses for severe pain. 03/21/23   Pilar Jarvis, MD  polyethylene glycol (MIRALAX) 17 g packet Take 17 g by mouth daily. 03/15/22   Wouk, Wilfred Curtis, MD  predniSONE (DELTASONE) 10 MG tablet Take 6 tabs p.o. on day 1 and decrease by 1 tablet daily until complete 06/19/23   Shirlee Latch, PA-C  TRELEGY ELLIPTA 200-62.5-25 MCG/ACT AEPB Inhale 1 puff into the lungs daily. 09/11/22   [provider]  traZODone (DESYREL) 50 MG tablet Take 1 tablet (50 mg total) by mouth at bedtime. 01/05/20 07/01/20  Aura Dials T, NP    Physical Exam Vitals: Blood pressure (!) 164/100, pulse 98, height 5\' 3"  (1.6 m), weight 171 lb (77.6 kg).  General: NAD HEENT: normocephalic, anicteric Thyroid: no enlargement, no palpable nodules Pulmonary: No increased work of breathing, CTAB Cardiovascular: RRR, distal pulses 2+ Breast: Breast symmetrical, no tenderness, no palpable nodules or masses, no skin or nipple retraction present, no nipple discharge.  No axillary or supraclavicular lymphadenopathy. Abdomen: NABS, soft, non-tender, non-distended.  Umbilicus without lesions.  No hepatomegaly, splenomegaly or masses palpable. No evidence of hernia  Genitourinary:  External: Normal external female genitalia.  Normal urethral meatus, normal Bartholin's and Skene's glands.    Vagina: Normal vaginal mucosa, no evidence of prolapse.    Cervix: Grossly normal in appearance,  no bleeding  Uterus: Non-enlarged, mobile, normal contour.  No CMT  Adnexa: ovaries non-enlarged, no adnexal masses  Rectal: deferred  Lymphatic: no evidence of inguinal lymphadenopathy Extremities: no edema, erythema, or tenderness Neurologic: Grossly intact Psychiatric: mood appropriate, affect full  Female chaperone present for pelvic and breast  portions of the physical exam     Assessment: 52 y.o. G9P0030 routine annual exam  Plan: Problem List Items Addressed This Visit       Cardiovascular and Mediastinum   HTN (hypertension)   Other Visit Diagnoses     Vaginal irritation    -  Primary   Relevant Orders   Cervicovaginal ancillary only   Annual physical exam       Cervical cancer screening       Relevant Orders   Cytology - PAP       1) Mammogram - recommend yearly screening mammogram.  Mammogram  has been ordered by her PCP  2) STI screening  wasoffered and  she is having an Aptima swab done today to check for BV and yeast.  3) ASCCP guidelines and rational discussed.  Patient opts for every 5 years screening interval  4) Osteoporosis screening has been managed by her PCP - per USPTF routine screening DEXA at age 52 -  Consider FDA-approved medical therapies in postmenopausal women and men aged 24 years and older, based on the following: a) A hip or vertebral (clinical or morphometric) fracture b) T-score <= -2.5 at the femoral neck or spine after appropriate evaluation to  exclude secondary causes C) Low bone mass (T-score between -1.0 and -2.5 at the femoral neck or spine) and a 10-year probability of a hip fracture >= 3% or a 10-year probability of a major osteoporosis-related fracture >= 20% based on the [place]-adapted WHO algorithm   5) Routine healthcare maintenance including cholesterol, diabetes screening discussed managed by PCP  6) Colonoscopy per her PCP as well.  Screening recommended starting at age 89 for average risk individuals, age 21 for  individuals deemed at increased risk (including African Americans) and recommended to continue until age 22.  For patient age 13-85 individualized approach is recommended.  Gold standard screening is via colonoscopy, Cologuard screening is an acceptable alternative for patient unwilling or unable to undergo colonoscopy.  "Colorectal cancer screening for average?risk adults: 2018 guideline update from the American Cancer Society"CA: A Cancer Journal for Clinicians: Mar 21, 2017   She is hypertensive today and also having some shortness of breath. After discussing her ED experience last evening, she did agree to return to the ED from our office for completion of her evaluation. I escorted her through the lobby and a family member is taking her to the ED.  7) Return in about 1 year (around 08/13/2024) for annual.   Mirna Mires, CNM  08/14/2023 2:27 PM    08/14/2023, 2:27 PM

## 2023-08-14 NOTE — ED Provider Notes (Signed)
[facility] Emergency Department Provider Note     Event Date/Time   First MD Initiated Contact with Patient 08/14/23 2001     (approximate)   History   Chest Pain   HPI  Catherine Giles is a 52 y.o. female with history of COPD, HTN, type II DM, hypothyroid disease, and lupus, presents to the ED from home, for evaluation of left-sided chest pain.  She report onset of symptoms yesterday.  She was reported to the ED for evaluation yesterday but left prior to being evaluated to secondary to the protracted wait.  She returns today noting worsening symptoms.  She also endorses cough, congestion, and shortness of breath.  Patient would also endorse some fatigue as well as some nausea without vomiting.  She denies any hemoptysis, abdominal pain, or distal edema.  Physical Exam   Triage Vital Signs: ED Triage Vitals  Encounter Vitals Group     BP 08/14/23 1831 (!) 167/105     Systolic BP Percentile --      Diastolic BP Percentile --      Pulse Rate 08/14/23 1831 (!) 107     Resp 08/14/23 1831 20     Temp 08/14/23 1831 98.3 F (36.8 C)     Temp Source 08/14/23 1831 Oral     SpO2 08/14/23 1831 92 %     Weight 08/14/23 1832 171 lb (77.6 kg)     Height 08/14/23 1832 5\' 3"  (1.6 m)     Head Circumference --      Peak Flow --      Pain Score 08/14/23 1832 10     Pain Loc --      Pain Education --      Exclude from Growth Chart --     Most recent vital signs: Vitals:   08/14/23 2230 08/15/23 0046  BP: 122/65 128/72  Pulse: 99 92  Resp:  20  Temp:  98.4 F (36.9 C)  SpO2:  93%    General Awake, no distress. NAD HEENT NCAT. PERRL. EOMI. No rhinorrhea. Mucous membranes are moist.  CV:  Good peripheral perfusion. Tachy rate. Normal S1S3 RESP:  Normal effort. CTA ABD:  No distention.    ED Results / Procedures / Treatments   Labs (all labs ordered are listed, but only abnormal results are displayed) Labs Reviewed  BASIC METABOLIC PANEL -  Abnormal; Notable for the following components:      Result Value   Potassium 3.0 (*)    Glucose, Bld 183 (*)    All other components within normal limits  CBC - Abnormal; Notable for the following components:   HCT 46.3 (*)    All other components within normal limits  D-DIMER, QUANTITATIVE - Abnormal; Notable for the following components:   D-Dimer, Quant 4.72 (*)    All other components within normal limits  TROPONIN I (HIGH SENSITIVITY)  TROPONIN I (HIGH SENSITIVITY)     EKG  Vent. rate 106 BPM PR interval 136 ms QRS duration 80 ms QT/QTcB 362/480 ms P-R-T axes 41 -13 -23 Sinus tachycardia Minimal voltage criteria for LVH, may be normal variant ( R in aVL ) Cannot rule out Anterior infarct , age undetermined Abnormal ECG When compared with ECG of 21-OCT-  RADIOLOGY  I personally viewed and evaluated these images as part of my medical decision making, as well as reviewing the written report by the radiologist.  ED Provider Interpretation: No plain no evidence of any intrathoracic process.  CT scan does not show any acute PE does show multifocal consolidations  CT Angio Chest PE W and/or Wo Contrast  Result Date: 08/15/2023 CLINICAL DATA:  Pulmonary embolus suspected with high probability. Left-sided chest pain starting today. History of COPD. Fatigue and nausea. EXAM: CT ANGIOGRAPHY CHEST WITH CONTRAST TECHNIQUE: Multidetector CT imaging of the chest was performed using the standard protocol during bolus administration of intravenous contrast. Multiplanar CT image reconstructions and MIPs were obtained to evaluate the vascular anatomy. RADIATION DOSE REDUCTION: This exam was performed according to the departmental dose-optimization program which includes automated exposure control, adjustment of the mA and/or kV according to patient size and/or use of iterative reconstruction technique. CONTRAST:  OMNIPAQUE IOHEXOL 350 MG/ML SOLN COMPARISON:  08/14/2023 screening CT.  FINDINGS: Cardiovascular: Technically adequate study with good opacification of the central and segmental pulmonary arteries. No focal filling defects. No evidence of significant pulmonary embolus. Normal heart size. No pericardial effusions. Normal caliber thoracic aorta. No aortic dissection. Minimal aortic calcification. Mediastinum/Nodes: Esophagus is decompressed. No significant lymphadenopathy. Retrosternal thyroid goiter extending into the upper anterior mediastinum and measuring 2.5 cm diameter. This connects to the thyroid isthmus via a small stalk. It is unchanged since 04/29/2020. Lungs/Pleura: Mild emphysematous changes in the lungs. Mosaic attenuation pattern to the lungs likely to represent multifocal pneumonia or edema. No pleural effusions. No pneumothorax. Upper Abdomen: No acute abnormality. Musculoskeletal: No chest wall abnormality. No acute or significant osseous findings. Review of the MIP images confirms the above findings. IMPRESSION: 1. No evidence of significant pulmonary embolus. 2. 2.5 cm diameter retrosternal thyroid goiter. No change since 2021 indicating likely benign. This has been evaluated on previous imaging. (ref: J Am Coll Radiol. 2015 Feb;12(2): 143-50). 3. Emphysematous changes in the lungs. 4. Mosaic attenuation pattern to the lungs may represent multifocal pneumonia or edema. 5. Minimal aortic atherosclerosis. Electronically Signed   By: Burman Nieves M.D.   On: 08/15/2023 00:18   DG Chest 2 View  Result Date: 08/14/2023 CLINICAL DATA:  Left-sided chest pain starting yesterday. Pain is worsening since yesterday. EXAM: CHEST - 2 VIEW COMPARISON:  08/13/2023 FINDINGS: Cardiac enlargement. No vascular congestion, edema, or consolidation. Linear scarring in the mid lungs and lung bases, similar to prior study. No pleural effusions. No pneumothorax. Mediastinal contours appear intact. IMPRESSION: Cardiac enlargement. Linear scarring in the mid and lower lungs. No evidence  of active pulmonary disease. Electronically Signed   By: Burman Nieves M.D.   On: 08/14/2023 21:35     PROCEDURES:  Critical Care performed: No  Procedures   MEDICATIONS ORDERED IN ED: Medications  ondansetron [facility]) injection 4 mg (4 mg Intravenous Given 08/14/23 2135)  morphine (PF) 4 MG/ML injection 4 mg (4 mg Intravenous Given 08/14/23 2135)  ipratropium-albuterol (DUONEB) 0.5-2.5 (3) MG/3ML nebulizer solution 3 mL (3 mLs Nebulization Given 08/14/23 2159)  potassium chloride SA (KLOR-CON M) CR tablet 40 mEq (40 mEq Oral Given 08/14/23 2157)  iohexol (OMNIPAQUE) 350 MG/ML injection 100 mL (100 mLs Intravenous Contrast Given 08/14/23 2237)  dextromethorphan (DELSYM) 30 MG/5ML liquid 30 mg (30 mg Oral Given 08/15/23 0045)  levofloxacin (LEVAQUIN) tablet 750 mg (750 mg Oral Given 08/15/23 0042)  oxyCODONE-acetaminophen (PERCOCET/ROXICET) 5-325 MG per tablet 1 tablet (1 tablet Oral Given 08/15/23 0042)     IMPRESSION / MDM / ASSESSMENT AND PLAN / ED COURSE  I reviewed the triage vital signs and the nursing notes.  Differential diagnosis includes, but is not limited to, ACS, aortic dissection, pulmonary embolism, cardiac tamponade, pneumothorax, pneumonia, pericarditis, myocarditis, GI-related causes including esophagitis/gastritis, and musculoskeletal chest wall pain.     Patient's presentation is most consistent with acute presentation with potential threat to life or bodily function.  Patient's diagnosis is consistent with CAP multifocal in nature.  With a history of COPD and prior pneumonia, presents to the ED for evaluation of her symptoms.  She was evaluated for complaints in ED, found to have her overall reassuring exam.  Initiated presentation was tachycardic with room air sats 92%.  She is endorsing chest wall pain which was pleuritic in nature.  She was further evaluated for complaints with a dimer which was elevated at 4.72.  Subsequent CTA  did not reveal any acute pulmonary emboli, but did confirm multifocal pneumonia.  Patient did not want to be admitted at this time for evaluation management of her pneumonia.  She is endorsing some front of her symptoms, and stable for outpatient management.  Patient will be discharged home with prescriptions for Levaquin, Hycodan syrup, Tessalon Perles, and potassium. Patient is to follow up with her primary provider as discussed as needed or otherwise directed. Patient is given ED precautions to return to the ED for any worsening or new symptoms.   FINAL CLINICAL IMPRESSION(S) / ED DIAGNOSES   Final diagnoses:  Community acquired pneumonia, unspecified laterality  Hypokalemia     Rx / DC Orders   ED Discharge Orders          Ordered    levofloxacin (LEVAQUIN) 750 MG tablet  Daily        08/15/23 0035    benzonatate (TESSALON PERLES) 100 MG capsule        08/15/23 0035    HYDROcodone bit-homatropine (HYCODAN) 5-1.5 MG/5ML syrup  Every 6 hours PRN        08/15/23 0035    potassium chloride SA (KLOR-CON M) 20 MEQ tablet  2 times daily        08/15/23 0036             Note:  This document was prepared using Dragon voice recognition software and may include unintentional dictation errors.    Lissa Hoard, PA-C 08/15/23 0056    Minna Antis, MD 08/15/23 2336

## 2023-08-15 DIAGNOSIS — J189 Pneumonia, unspecified organism: Secondary | ICD-10-CM | POA: Diagnosis not present

## 2023-08-15 MED ORDER — BENZONATATE 100 MG PO CAPS: ORAL_CAPSULE | ORAL | 0 refills | Status: DC

## 2023-08-15 MED ORDER — DEXTROMETHORPHAN POLISTIREX ER 30 MG/5ML PO SUER
30.0000 mg | Freq: Once | ORAL | Status: AC
  Administered 2023-08-15: 30 mg via ORAL
  Filled 2023-08-15: qty 5

## 2023-08-15 MED ORDER — POTASSIUM CHLORIDE CRYS ER 20 MEQ PO TBCR: 20.0000 meq | EXTENDED_RELEASE_TABLET | Freq: Two times a day (BID) | ORAL | 0 refills | Status: DC

## 2023-08-15 MED ORDER — OXYCODONE-ACETAMINOPHEN 5-325 MG PO TABS
1.0000 | ORAL_TABLET | Freq: Once | ORAL | Status: AC
  Administered 2023-08-15: 1 via ORAL
  Filled 2023-08-15: qty 1

## 2023-08-15 MED ORDER — HYDROCODONE BIT-HOMATROP MBR 5-1.5 MG/5ML PO SOLN: 5.0000 mL | Freq: Four times a day (QID) | ORAL | 0 refills | Status: DC | PRN

## 2023-08-15 MED ORDER — LEVOFLOXACIN 750 MG PO TABS
750.0000 mg | ORAL_TABLET | Freq: Once | ORAL | Status: AC
  Administered 2023-08-15: 750 mg via ORAL
  Filled 2023-08-15: qty 1

## 2023-08-15 MED ORDER — LEVOFLOXACIN 750 MG PO TABS: 750.0000 mg | ORAL_TABLET | Freq: Every day | ORAL | 0 refills | Status: AC

## 2023-08-15 NOTE — ED Notes (Signed)
Provided pt with discharge instructions and education. All of pt questions answered. Pt in possession of all belongings. Pt AAOX4 and stable at time of discharge. Pt wheeled to daughters vehicle safely.

## 2023-08-15 NOTE — Discharge Instructions (Addendum)
Your exam, labs, x-ray, and CT overall reassuring.  Your CT scan did not show a blood clot in the lungs, but did show multifocal pneumonia.  You will be treated with Levaquin daily as directed.  Take the remaining prescription meds as directed.  Follow-up with your primary provider for ongoing concerns.  Return to ED if necessary.

## 2023-08-16 LAB — CERVICOVAGINAL ANCILLARY ONLY
Bacterial Vaginitis (gardnerella): NEGATIVE
Candida Glabrata: NEGATIVE
Candida Vaginitis: NEGATIVE
Comment: NEGATIVE
Comment: NEGATIVE
Comment: NEGATIVE

## 2023-08-17 ENCOUNTER — Encounter: Payer: Self-pay | Admitting: Obstetrics

## 2023-08-22 LAB — CYTOLOGY - PAP
Comment: NEGATIVE
Diagnosis: NEGATIVE
Diagnosis: REACTIVE
High risk HPV: NEGATIVE

## 2023-08-29 ENCOUNTER — Encounter: Payer: Self-pay | Admitting: Internal Medicine

## 2023-08-29 DIAGNOSIS — R0602 Shortness of breath: Secondary | ICD-10-CM

## 2023-08-29 DIAGNOSIS — E1159 Type 2 diabetes mellitus with other circulatory complications: Secondary | ICD-10-CM

## 2023-08-29 DIAGNOSIS — I2089 Other forms of angina pectoris: Secondary | ICD-10-CM

## 2023-08-29 DIAGNOSIS — I152 Hypertension secondary to endocrine disorders: Secondary | ICD-10-CM

## 2023-09-04 ENCOUNTER — Inpatient Hospital Stay: Payer: 59 | Admitting: Oncology

## 2023-09-04 ENCOUNTER — Inpatient Hospital Stay: Payer: 59

## 2023-09-07 ENCOUNTER — Inpatient Hospital Stay: Payer: 59 | Attending: Oncology | Admitting: Oncology

## 2023-09-07 ENCOUNTER — Inpatient Hospital Stay: Payer: 59

## 2023-09-07 ENCOUNTER — Encounter: Payer: Self-pay | Admitting: Oncology

## 2023-09-07 VITALS — BP 160/106 | HR 86 | Temp 97.0°F | Resp 16 | Ht 63.0 in | Wt 173.0 lb

## 2023-09-07 DIAGNOSIS — E119 Type 2 diabetes mellitus without complications: Secondary | ICD-10-CM | POA: Diagnosis not present

## 2023-09-07 DIAGNOSIS — J439 Emphysema, unspecified: Secondary | ICD-10-CM | POA: Diagnosis not present

## 2023-09-07 DIAGNOSIS — J441 Chronic obstructive pulmonary disease with (acute) exacerbation: Secondary | ICD-10-CM | POA: Insufficient documentation

## 2023-09-07 DIAGNOSIS — Z79899 Other long term (current) drug therapy: Secondary | ICD-10-CM | POA: Insufficient documentation

## 2023-09-07 DIAGNOSIS — R7989 Other specified abnormal findings of blood chemistry: Secondary | ICD-10-CM

## 2023-09-07 DIAGNOSIS — Z833 Family history of diabetes mellitus: Secondary | ICD-10-CM | POA: Insufficient documentation

## 2023-09-07 DIAGNOSIS — Z803 Family history of malignant neoplasm of breast: Secondary | ICD-10-CM | POA: Diagnosis not present

## 2023-09-07 DIAGNOSIS — Z7952 Long term (current) use of systemic steroids: Secondary | ICD-10-CM | POA: Insufficient documentation

## 2023-09-07 DIAGNOSIS — Z880 Allergy status to penicillin: Secondary | ICD-10-CM | POA: Insufficient documentation

## 2023-09-07 DIAGNOSIS — Z9049 Acquired absence of other specified parts of digestive tract: Secondary | ICD-10-CM | POA: Insufficient documentation

## 2023-09-07 DIAGNOSIS — I119 Hypertensive heart disease without heart failure: Secondary | ICD-10-CM | POA: Insufficient documentation

## 2023-09-07 DIAGNOSIS — Z8041 Family history of malignant neoplasm of ovary: Secondary | ICD-10-CM | POA: Diagnosis not present

## 2023-09-07 DIAGNOSIS — I700 Atherosclerosis of aorta: Secondary | ICD-10-CM | POA: Insufficient documentation

## 2023-09-07 DIAGNOSIS — I2699 Other pulmonary embolism without acute cor pulmonale: Secondary | ICD-10-CM | POA: Insufficient documentation

## 2023-09-07 DIAGNOSIS — R079 Chest pain, unspecified: Secondary | ICD-10-CM | POA: Diagnosis not present

## 2023-09-07 DIAGNOSIS — E079 Disorder of thyroid, unspecified: Secondary | ICD-10-CM | POA: Insufficient documentation

## 2023-09-07 DIAGNOSIS — D72829 Elevated white blood cell count, unspecified: Secondary | ICD-10-CM | POA: Diagnosis not present

## 2023-09-07 DIAGNOSIS — R0602 Shortness of breath: Secondary | ICD-10-CM | POA: Insufficient documentation

## 2023-09-07 DIAGNOSIS — F17210 Nicotine dependence, cigarettes, uncomplicated: Secondary | ICD-10-CM | POA: Insufficient documentation

## 2023-09-07 DIAGNOSIS — Z8249 Family history of ischemic heart disease and other diseases of the circulatory system: Secondary | ICD-10-CM | POA: Insufficient documentation

## 2023-09-07 DIAGNOSIS — Z881 Allergy status to other antibiotic agents status: Secondary | ICD-10-CM | POA: Insufficient documentation

## 2023-09-07 DIAGNOSIS — N644 Mastodynia: Secondary | ICD-10-CM | POA: Diagnosis not present

## 2023-09-07 DIAGNOSIS — Z809 Family history of malignant neoplasm, unspecified: Secondary | ICD-10-CM | POA: Diagnosis not present

## 2023-09-07 DIAGNOSIS — Z801 Family history of malignant neoplasm of trachea, bronchus and lung: Secondary | ICD-10-CM | POA: Diagnosis not present

## 2023-09-07 LAB — APTT: aPTT: 26 s (ref 24–36)

## 2023-09-07 LAB — CBC WITH DIFFERENTIAL/PLATELET
Abs Immature Granulocytes: 0.07 10*3/uL (ref 0.00–0.07)
Basophils Absolute: 0.1 10*3/uL (ref 0.0–0.1)
Basophils Relative: 0 %
Eosinophils Absolute: 0.1 10*3/uL (ref 0.0–0.5)
Eosinophils Relative: 0 %
HCT: 46.8 % — ABNORMAL HIGH (ref 36.0–46.0)
Hemoglobin: 15.1 g/dL — ABNORMAL HIGH (ref 12.0–15.0)
Immature Granulocytes: 1 %
Lymphocytes Relative: 48 %
Lymphs Abs: 7.2 10*3/uL — ABNORMAL HIGH (ref 0.7–4.0)
MCH: 30.6 pg (ref 26.0–34.0)
MCHC: 32.3 g/dL (ref 30.0–36.0)
MCV: 94.9 fL (ref 80.0–100.0)
Monocytes Absolute: 0.7 10*3/uL (ref 0.1–1.0)
Monocytes Relative: 5 %
Neutro Abs: 6.9 10*3/uL (ref 1.7–7.7)
Neutrophils Relative %: 46 %
Platelets: 326 10*3/uL (ref 150–400)
RBC: 4.93 MIL/uL (ref 3.87–5.11)
RDW: 13.6 % (ref 11.5–15.5)
Smear Review: NORMAL
WBC: 15 10*3/uL — ABNORMAL HIGH (ref 4.0–10.5)
nRBC: 0.2 % (ref 0.0–0.2)

## 2023-09-07 LAB — COMPREHENSIVE METABOLIC PANEL
ALT: 16 U/L (ref 0–44)
AST: 13 U/L — ABNORMAL LOW (ref 15–41)
Albumin: 3.8 g/dL (ref 3.5–5.0)
Alkaline Phosphatase: 69 U/L (ref 38–126)
Anion gap: 9 (ref 5–15)
BUN: 13 mg/dL (ref 6–20)
CO2: 23 mmol/L (ref 22–32)
Calcium: 9.1 mg/dL (ref 8.9–10.3)
Chloride: 106 mmol/L (ref 98–111)
Creatinine, Ser: 0.35 mg/dL — ABNORMAL LOW (ref 0.44–1.00)
GFR, Estimated: >60 mL/min (ref >60)
Glucose, Bld: 116 mg/dL — ABNORMAL HIGH (ref 70–99)
Potassium: 3.5 mmol/L (ref 3.5–5.1)
Sodium: 138 mmol/L (ref 135–145)
Total Bilirubin: 0.4 mg/dL (ref <1.2)
Total Protein: 7.5 g/dL (ref 6.5–8.1)

## 2023-09-07 LAB — PROTIME-INR
INR: 1 (ref 0.8–1.2)
Prothrombin Time: 13.7 s (ref 11.4–15.2)

## 2023-09-07 NOTE — Progress Notes (Signed)
States she has been having positive d-dimer results for a couple of years and very worried. Having sharp pain right behind left breast x2 weeks. Patient is very emotional today as she lost her children's father last week to cancer.

## 2023-09-07 NOTE — Progress Notes (Signed)
[facility]  [phone] [phone]  ID: Catherine Giles OB: [birth year]  MR#: [id number]  YQM#:[id number]  Patient Care Team: [facility] as PCP - General  CHIEF COMPLAINT: Elevated D-dimer.  INTERVAL HISTORY: Patient is a 52 year old female who is seen in the emergency room several times this year with COPD exacerbation and pneumonia.  A D-dimer was drawn during both visits and found to be elevated.  She is anxious, but otherwise feels well.  She reports left breast pain.  She reports a family history of blood clots, but does not offer further specifics.  She has no neurologic complaints.  She denies any recent fevers.  She has a good appetite and denies weight loss.  She has no chest pain, shortness of breath, cough, or hemoptysis.  She denies any nausea, vomiting, constipation, or diarrhea.  She has no urinary complaints.  Patient offers no further specific complaints today.  REVIEW OF SYSTEMS:   Review of Systems  Constitutional: Negative.  Negative for fever, malaise/fatigue and weight loss.  Respiratory: Negative.  Negative for cough, hemoptysis and shortness of breath.   Cardiovascular: Negative.  Negative for chest pain and leg swelling.  Gastrointestinal: Negative.  Negative for abdominal pain.  Genitourinary: Negative.  Negative for dysuria.  Musculoskeletal: Negative.  Negative for back pain.  Skin: Negative.  Negative for rash.  Neurological: Negative.  Negative for dizziness, focal weakness, weakness and headaches.  Psychiatric/Behavioral:  The patient is nervous/anxious.     As per HPI. Otherwise, a complete review of systems is negative.  PAST MEDICAL HISTORY: Past Medical History:  Diagnosis Date   Arthritis    RA   Asthma    COPD (chronic obstructive pulmonary disease) (HCC)    Depression    Diabetes mellitus without complication (HCC)    Emphysema of lung (HCC)    Family history of breast cancer     6/21 cancer genetic testing letter sent   Fibromyalgia    Hypertension    Lupus    Pre-diabetes    Thyroid disease     PAST SURGICAL HISTORY: Past Surgical History:  Procedure Laterality Date   CHOLECYSTECTOMY, LAPAROSCOPIC  02/2022   MOUTH SURGERY     TRANSTHORACIC ECHOCARDIOGRAM  04/2020   EF 60 to 65%.  Normal LV function.  No R WMA.  GR 1 DD.  Normal RV size and function.  Normal PAP.  Normal atrial size.  Normal valves.    FAMILY HISTORY: Family History  Problem Relation Age of Onset   Lung cancer Mother    Diabetes Mother    Breast cancer Mother 69   Hypertension Father    Cancer Maternal Aunt    Ovarian cancer Maternal Aunt    Diabetes Maternal Aunt    Diabetes Maternal Uncle    Hypertension Maternal Grandmother    Diabetes Maternal Grandmother     ADVANCED DIRECTIVES (Y/N):  N  HEALTH MAINTENANCE: Social History   Tobacco Use   Smoking status: Every Day    Current packs/day: 1.00    Average packs/day: 1 pack/day for 34.0 years (34.0 ttl pk-yrs)    Types: Cigarettes   Smokeless tobacco: Never   Tobacco comments:    2-3 cig/day  Vaping Use   Vaping status: Never Used  Substance Use Topics   Alcohol use: Yes    Comment: Occ   Drug use: Not Currently     Colonoscopy:  PAP:  Bone density:  Lipid panel:  Allergies  Allergen  Reactions   Azithromycin Anaphylaxis   Molds & Smuts Cough   Alpha-Gal Other (See Comments)   Penicillins     Did it involve swelling of the face/tongue/throat, SOB, or low BP? Yes Did it involve sudden or severe rash/hives, skin peeling, or any reaction on the inside of your mouth or nose? No Did you need to seek medical attention at a hospital or doctor's office? No When did it last happen?       If all above answers are "NO", may proceed with cephalosporin use.    Shellfish Allergy     Current Outpatient Medications  Medication Sig Dispense Refill   Acidophilus Lactobacillus CAPS Take 1 tablet by mouth daily.      albuterol (VENTOLIN HFA) 108 (90 Base) MCG/ACT inhaler      amLODipine (NORVASC) 10 MG tablet Take 1 tablet (10 mg total) by mouth daily. 90 tablet 4   Blood Glucose Monitoring Suppl (ONETOUCH VERIO) w/Device KIT Use to check blood sugar 3 times daily with goal <130 fasting and <180 two hours after meal.  Bring meter to visits. 1 kit 0   cetirizine (ZYRTEC) 10 MG tablet Take 1 tablet (10 mg total) by mouth daily. 90 tablet 11   cloNIDine (CATAPRES) 0.1 MG tablet TAKE 1 TABLET BY MOUTH TWICE DAILY 60 tablet 0   cyclobenzaprine (FLEXERIL) 5 MG tablet Take 1 tablet every day by oral route at bedtime.     dapagliflozin propanediol (FARXIGA) 10 MG TABS tablet Take 1 tablet (10 mg total) by mouth daily before breakfast. 90 tablet 4   EPINEPHrine 0.3 mg/0.3 mL IJ SOAJ injection Inject 0.3 mg into the muscle as needed for anaphylaxis. 1 each 4   glucose blood test strip Use to check blood sugar 3 times daily with goal <130 fasting and <180 two hours after meal. 100 each 12   golimumab (SIMPONI ARIA) 50 MG/4ML SOLN injection Inject into the vein.     hydrochlorothiazide (HYDRODIURIL) 25 MG tablet Take 25 mg by mouth daily.     HYDROcodone bit-homatropine (HYCODAN) 5-1.5 MG/5ML syrup Take 5 mLs by mouth every 6 (six) hours as needed for cough. 120 mL 0   hydrOXYzine (VISTARIL) 25 MG capsule TAKE 1 CAPSULE BY MOUTH EVERY 8 HOURS ASNEEDED 60 capsule 0   ibuprofen (ADVIL) 600 MG tablet Take 1 tablet (600 mg total) by mouth every 8 (eight) hours as needed. 30 tablet 0   ipratropium (ATROVENT) 0.06 % nasal spray Place 2 sprays into both nostrils 4 (four) times daily. 15 mL 12   ipratropium-albuterol (DUONEB) 0.5-2.5 (3) MG/3ML SOLN Take 3 mLs by nebulization every 6 (six) hours as needed for up to 7 days. 360 mL 0   Lancets (ONETOUCH ULTRASOFT) lancets Use to check blood sugar 3 times daily with goal <130 fasting and <180 two hours after meal. 100 each 12   Lancets (ONETOUCH ULTRASOFT) lancets Use as directed 60  each 5   levothyroxine (SYNTHROID) 50 MCG tablet Take 50 mcg by mouth daily before breakfast.      losartan (COZAAR) 100 MG tablet Take 100 mg by mouth daily.     montelukast (SINGULAIR) 10 MG tablet Take 10 mg by mouth daily.     ondansetron (ZOFRAN-ODT) 4 MG disintegrating tablet Take 1 tablet (4 mg total) by mouth every 8 (eight) hours as needed for nausea or vomiting. 20 tablet 0   potassium chloride SA (KLOR-CON M) 20 MEQ tablet Take 1 tablet (20 mEq total) by mouth 2 (  two) times daily for 5 days. 10 tablet 0   predniSONE (DELTASONE) 10 MG tablet Take 6 tabs p.o. on day 1 and decrease by 1 tablet daily until complete 21 tablet 0   TRELEGY ELLIPTA 200-62.5-25 MCG/ACT AEPB Inhale 1 puff into the lungs daily.     benzonatate (TESSALON PERLES) 100 MG capsule Take 1-2 tabs TID prn cough (Patient not taking: Reported on 09/07/2023) 30 capsule 0   leflunomide (ARAVA) 20 MG tablet Take 20 mg by mouth daily. (Patient not taking: Reported on 09/07/2023)     metFORMIN (GLUCOPHAGE-XR) 500 MG 24 hr tablet  (Patient not taking: Reported on 09/07/2023)     nicotine polacrilex (NICOTINE MINI) 4 MG lozenge Take 1 lozenge (4 mg total) by mouth as needed for smoking cessation. (Patient not taking: Reported on 09/07/2023) 100 tablet 0   polyethylene glycol (MIRALAX) 17 g packet Take 17 g by mouth daily. (Patient not taking: Reported on 09/07/2023) 14 each 0   No current facility-administered medications for this visit.    OBJECTIVE: Vitals:   09/07/23 1007  BP: (!) 160/106  Pulse: 86  Resp: 16  Temp: (!) 97 F (36.1 C)  SpO2: 97%     Body mass index is 30.65 kg/m.    ECOG FS:0 - Asymptomatic  General: Well-developed, well-nourished, no acute distress. Eyes: Pink conjunctiva, anicteric sclera. HEENT: Normocephalic, moist mucous membranes. Lungs: No audible wheezing or coughing. Heart: Regular rate and rhythm. Abdomen: Soft, nontender, no obvious distention. Musculoskeletal: No edema, cyanosis, or  clubbing. Neuro: Alert, answering all questions appropriately. Cranial nerves grossly intact. Skin: No rashes or petechiae noted. Psych: Normal affect. Lymphatics: No cervical, calvicular, axillary or inguinal LAD.   LAB RESULTS:  Lab Results  Component Value Date   NA 138 09/07/2023   K 3.5 09/07/2023   CL 106 09/07/2023   CO2 23 09/07/2023   GLUCOSE 116 (H) 09/07/2023   BUN 13 09/07/2023   CREATININE 0.35 (L) 09/07/2023   CALCIUM 9.1 09/07/2023   PROT 7.5 09/07/2023   ALBUMIN 3.8 09/07/2023   AST 13 (L) 09/07/2023   ALT 16 09/07/2023   ALKPHOS 69 09/07/2023   BILITOT 0.4 09/07/2023   GFRNONAA >60 09/07/2023   GFRAA 132 10/12/2020    Lab Results  Component Value Date   WBC 15.0 (H) 09/07/2023   NEUTROABS 6.9 09/07/2023   HGB 15.1 (H) 09/07/2023   HCT 46.8 (H) 09/07/2023   MCV 94.9 09/07/2023   PLT 326 09/07/2023     STUDIES: CT Angio Chest PE W and/or Wo Contrast  Result Date: 08/15/2023 CLINICAL DATA:  Pulmonary embolus suspected with high probability. Left-sided chest pain starting today. History of COPD. Fatigue and nausea. EXAM: CT ANGIOGRAPHY CHEST WITH CONTRAST TECHNIQUE: Multidetector CT imaging of the chest was performed using the standard protocol during bolus administration of intravenous contrast. Multiplanar CT image reconstructions and MIPs were obtained to evaluate the vascular anatomy. RADIATION DOSE REDUCTION: This exam was performed according to the departmental dose-optimization program which includes automated exposure control, adjustment of the mA and/or kV according to patient size and/or use of iterative reconstruction technique. CONTRAST:  OMNIPAQUE IOHEXOL 350 MG/ML SOLN COMPARISON:  08/14/2023 screening CT. FINDINGS: Cardiovascular: Technically adequate study with good opacification of the central and segmental pulmonary arteries. No focal filling defects. No evidence of significant pulmonary embolus. Normal heart size. No pericardial  effusions. Normal caliber thoracic aorta. No aortic dissection. Minimal aortic calcification. Mediastinum/Nodes: Esophagus is decompressed. No significant lymphadenopathy. Retrosternal thyroid goiter extending  into the upper anterior mediastinum and measuring 2.5 cm diameter. This connects to the thyroid isthmus via a small stalk. It is unchanged since 04/29/2020. Lungs/Pleura: Mild emphysematous changes in the lungs. Mosaic attenuation pattern to the lungs likely to represent multifocal pneumonia or edema. No pleural effusions. No pneumothorax. Upper Abdomen: No acute abnormality. Musculoskeletal: No chest wall abnormality. No acute or significant osseous findings. Review of the MIP images confirms the above findings. IMPRESSION: 1. No evidence of significant pulmonary embolus. 2. 2.5 cm diameter retrosternal thyroid goiter. No change since 2021 indicating likely benign. This has been evaluated on previous imaging. (ref: J Am Coll Radiol. 2015 Feb;12(2): 143-50). 3. Emphysematous changes in the lungs. 4. Mosaic attenuation pattern to the lungs may represent multifocal pneumonia or edema. 5. Minimal aortic atherosclerosis. Electronically Signed   By: Burman Nieves M.D.   On: 08/15/2023 00:18   DG Chest 2 View  Result Date: 08/14/2023 CLINICAL DATA:  Left-sided chest pain starting yesterday. Pain is worsening since yesterday. EXAM: CHEST - 2 VIEW COMPARISON:  08/13/2023 FINDINGS: Cardiac enlargement. No vascular congestion, edema, or consolidation. Linear scarring in the mid lungs and lung bases, similar to prior study. No pleural effusions. No pneumothorax. Mediastinal contours appear intact. IMPRESSION: Cardiac enlargement. Linear scarring in the mid and lower lungs. No evidence of active pulmonary disease. Electronically Signed   By: Burman Nieves M.D.   On: 08/14/2023 21:35   DG Chest 2 View  Result Date: 08/14/2023 CLINICAL DATA:  Chest pain and shortness of breath EXAM: CHEST - 2 VIEW  COMPARISON:  06/04/2023 FINDINGS: Cardiac shadow is prominent. Lungs are well aerated bilaterally. No focal infiltrate or effusion is seen. No bony abnormality is noted. IMPRESSION: Mild cardiomegaly without acute infiltrate. Electronically Signed   By: Alcide Clever M.D.   On: 08/14/2023 01:04    ASSESSMENT: Elevated D-dimer.  PLAN:    Elevated D-dimer: Likely secondary to patient's ongoing acute issues during blood draw, therefore likely clinically insignificant.  She has no evidence of blood clot.  CT scan of the chest on August 14, 2023 and CT scan of the abdomen and pelvis on December 25, 2022 revealed no significant pathology. Left breast pain: Patient has been instructed to schedule mammogram that was previously ordered by another physician. Family history of blood clots: Unclear on specifics.  Have ordered factor V Leiden and prothrombin gene mutation for completeness. Leukocytosis: Likely reactive. Disposition: Patient have video-assisted telemedicine visit in 3 weeks to discuss her laboratory results.  I spent a total of 45 minutes reviewing chart data, face-to-face evaluation with the patient, counseling and coordination of care as detailed above.   Patient expressed understanding and was in agreement with this plan. She also understands that She can call clinic at any time with any questions, concerns, or complaints.    Cancer Staging  No matching staging information was found for the patient.   Jeralyn Ruths, MD   09/07/2023 3:25 PM

## 2023-09-11 ENCOUNTER — Other Ambulatory Visit: Payer: Self-pay

## 2023-09-11 DIAGNOSIS — Z87891 Personal history of nicotine dependence: Secondary | ICD-10-CM

## 2023-09-11 DIAGNOSIS — Z122 Encounter for screening for malignant neoplasm of respiratory organs: Secondary | ICD-10-CM

## 2023-09-13 LAB — PROTHROMBIN GENE MUTATION

## 2023-09-14 ENCOUNTER — Other Ambulatory Visit: Payer: Self-pay

## 2023-09-14 ENCOUNTER — Emergency Department: Payer: 59

## 2023-09-14 ENCOUNTER — Encounter: Payer: Self-pay | Admitting: Emergency Medicine

## 2023-09-14 ENCOUNTER — Emergency Department
Admission: EM | Admit: 2023-09-14 | Discharge: 2023-09-14 | Disposition: A | Discharge status: Home / Self Care | Payer: 59 | Attending: Emergency Medicine | Admitting: Emergency Medicine

## 2023-09-14 DIAGNOSIS — M791 Myalgia, unspecified site: Secondary | ICD-10-CM | POA: Diagnosis present

## 2023-09-14 DIAGNOSIS — Y92512 Supermarket, store or market as the place of occurrence of the external cause: Secondary | ICD-10-CM | POA: Insufficient documentation

## 2023-09-14 DIAGNOSIS — M545 Low back pain, unspecified: Secondary | ICD-10-CM | POA: Insufficient documentation

## 2023-09-14 DIAGNOSIS — W1839XA Other fall on same level, initial encounter: Secondary | ICD-10-CM | POA: Insufficient documentation

## 2023-09-14 DIAGNOSIS — M542 Cervicalgia: Secondary | ICD-10-CM | POA: Insufficient documentation

## 2023-09-14 DIAGNOSIS — M25511 Pain in right shoulder: Secondary | ICD-10-CM | POA: Diagnosis not present

## 2023-09-14 DIAGNOSIS — W19XXXA Unspecified fall, initial encounter: Secondary | ICD-10-CM

## 2023-09-14 DIAGNOSIS — M7918 Myalgia, other site: Secondary | ICD-10-CM

## 2023-09-14 NOTE — ED Notes (Signed)
See triage note  Presents s/p fall 2 days ago  States she slipped and twisted  Landed on her back  Having pain to lower back and neck area States she was seen 2 days ago but did not have any x-rays done at that time Ambulates slowly d/t pain

## 2023-09-14 NOTE — ED Provider Notes (Signed)
[facility] Provider Note    Event Date/Time   First MD Initiated Contact with Patient 09/14/23 [phone]     (approximate)   History   Fall   HPI  Catherine Giles is a 52 y.o. female   presents to the ED with complaint of continued musculoskeletal pain after a fall that occurred approximately 2 days ago when she slipped on some wet flooring in a store.  Patient denies any head injury or loss of consciousness.  She continues to have pain in her neck, right shoulder and lumbar spine.  Patient was seen in [place] where she was given a prescription for Flexeril and hydrocodone which she states is not helping.      Physical Exam   Triage Vital Signs: ED Triage Vitals  Encounter Vitals Group     BP 09/14/23 0830 (!) 145/96     Systolic BP Percentile --      Diastolic BP Percentile --      Pulse Rate 09/14/23 0830 (!) 109     Resp 09/14/23 0830 17     Temp 09/14/23 0831 97.8 F (36.6 C)     Temp Source 09/14/23 0831 Oral     SpO2 09/14/23 0830 98 %     Weight 09/14/23 0830 173 lb 1 oz (78.5 kg)     Height 09/14/23 0830 5\' 3"  (1.6 m)     Head Circumference --      Peak Flow --      Pain Score 09/14/23 0830 10     Pain Loc --      Pain Education --      Exclude from Growth Chart --     Most recent vital signs: Vitals:   09/14/23 0831 09/14/23 1107  BP:  132/88  Pulse:  92  Resp:  16  Temp: 97.8 F (36.6 C) 98.1 F (36.7 C)  SpO2:  96%     General: Awake, no distress.  Tearful.  Antalgic gait noted. CV:  Good peripheral perfusion.  Heart regular rate and rhythm. Resp:  Normal effort.  Lungs are clear bilaterally. Abd:  No distention.  Soft, nontender. Other:  Moderate tenderness on palpation of cervical spine midline.  No cervical muscle tissue edema or discoloration.  There is tenderness on palpation of the right clavicle without deformity.  No tenderness is noted on palpation of the right shoulder, AC joint or deltoid region.  Range of  motion only increases the pain in the right clavicular region.  Moderate tenderness on palpation of the lumbar spine and range of motion is moderately restricted secondary to discomfort.  Tenderness noted with compression of the pelvis however patient is able to stand and ambulate slowly without any assistance.   ED Results / Procedures / Treatments   Labs (all labs ordered are listed, but only abnormal results are displayed) Labs Reviewed - No data to display   RADIOLOGY Cervical spine x-ray images were reviewed interpreted by myself independent radiologist and was negative for fracture or subluxation. Lumbar spine x-ray images were reviewed and interpreted by myself independent of the radiologist and is negative for fracture or subluxation.   Pelvis x-ray images were negative for fracture or acute injury. Right clavicle x-ray images were reviewed by myself independent of the radiologist and was negative for fracture. Radiology reports confirm noted as above.    PROCEDURES:  Critical Care performed:   Procedures   MEDICATIONS ORDERED IN ED: Medications - No data to display  IMPRESSION / MDM / ASSESSMENT AND PLAN / ED COURSE  I reviewed the triage vital signs and the nursing notes.   Differential diagnosis includes, but is not limited to, cervical fracture, cervical strain, contusion, right clavicle fracture, contusion, lumbar strain, left hip pain, strain, fracture, dislocation secondary to fall.  51 year old female presents to the ED with multiple complaints after a fall that occurred 2 days ago.  Patient states that she slipped in water.  She was seen initially at a another ER facility and states she was placed on pain medication and muscle relaxants which has not helped.  No images were done.  X-rays today were reassuring and patient was made aware that there are no fractures but she does have some degenerative changes.  Patient states that currently she continues to take  prednisone 10 mg p.o. daily from her rheumatologist.  She will continue with muscle relaxant and pain medication as needed.  She is encouraged to use ice or heat to her muscles as needed for discomfort.  Follow-up with her PCP if any continued problems.      Patient's presentation is most consistent with acute illness / injury with system symptoms.  FINAL CLINICAL IMPRESSION(S) / ED DIAGNOSES   Final diagnoses:  Musculoskeletal pain  Fall, initial encounter     Rx / DC Orders   ED Discharge Orders     None        Note:  This document was prepared using Dragon voice recognition software and may include unintentional dictation errors.   Tommi Rumps, PA-C 09/14/23 1312    Sharman Cheek, MD 09/14/23 [phone]

## 2023-09-14 NOTE — ED Triage Notes (Signed)
Pt here after a fall on Wed. Pt states she went to another facility and was just given pain medication. Pt states she slipped on water and landed on her tailbone. Pt c/o neck, left leg, right shoulder, and tailbone pain. Pt denies any urinary issues and is ambulatory to triage.

## 2023-09-14 NOTE — Discharge Instructions (Signed)
Call make an appoint with your primary care provider if any continued problems or concerns.  Continue with your prednisone as prescribed by your rheumatologist.  Continue with the Flexeril every 8 hours and hydrocodone as needed for pain.  You may use ice or moist heat to your muscles as needed for discomfort.

## 2023-09-17 LAB — FACTOR 5 LEIDEN

## 2023-09-27 ENCOUNTER — Inpatient Hospital Stay: Payer: 59 | Attending: Oncology | Admitting: Oncology

## 2023-09-27 DIAGNOSIS — R7989 Other specified abnormal findings of blood chemistry: Secondary | ICD-10-CM

## 2023-09-27 NOTE — Progress Notes (Signed)
[facility]  [phone] [phone]  ID: Catherine Giles OB: [birth year]  MR#: [id number]  QVZ#:[id number]  Patient Care Team: Center, [facility] as PCP - General  I connected with Catherine Giles on 09/27/23 at  3:30 PM EST by video enabled telemedicine visit and verified that I am speaking with the correct person using two identifiers.   I discussed the limitations, risks, security and privacy concerns of performing an evaluation and management service by telemedicine and the availability of in-person appointments. I also discussed with the patient that there may be a patient responsible charge related to this service. The patient expressed understanding and agreed to proceed.   Other persons participating in the visit and their role in the encounter: Patient, MD.  Patient's location: Home. Provider's location: Clinic.  CHIEF COMPLAINT: Elevated D-dimer.  INTERVAL HISTORY: Patient agreed to video-assisted telemedicine visit for further evaluation and discussion of her laboratory results.  She currently feels well and is asymptomatic. She has no neurologic complaints.  She denies any recent fevers.  She has a good appetite and denies weight loss.  She has no chest pain, shortness of breath, cough, or hemoptysis.  She denies any nausea, vomiting, constipation, or diarrhea.  She has no urinary complaints.  Patient offers no specific complaints today.  REVIEW OF SYSTEMS:   Review of Systems  Constitutional: Negative.  Negative for fever, malaise/fatigue and weight loss.  Respiratory: Negative.  Negative for cough, hemoptysis and shortness of breath.   Cardiovascular: Negative.  Negative for chest pain and leg swelling.  Gastrointestinal: Negative.  Negative for abdominal pain.  Genitourinary: Negative.  Negative for dysuria.  Musculoskeletal: Negative.  Negative for back pain.  Skin: Negative.  Negative for rash.   Neurological: Negative.  Negative for dizziness, focal weakness, weakness and headaches.  Psychiatric/Behavioral: Negative.  The patient is not nervous/anxious.     As per HPI. Otherwise, a complete review of systems is negative.  PAST MEDICAL HISTORY: Past Medical History:  Diagnosis Date   Arthritis    RA   Asthma    COPD (chronic obstructive pulmonary disease) (HCC)    Depression    Diabetes mellitus without complication (HCC)    Emphysema of lung (HCC)    Family history of breast cancer    6/21 cancer genetic testing letter sent   Fibromyalgia    Hypertension    Lupus    Pre-diabetes    Thyroid disease     PAST SURGICAL HISTORY: Past Surgical History:  Procedure Laterality Date   CHOLECYSTECTOMY, LAPAROSCOPIC  02/2022   MOUTH SURGERY     TRANSTHORACIC ECHOCARDIOGRAM  04/2020   EF 60 to 65%.  Normal LV function.  No R WMA.  GR 1 DD.  Normal RV size and function.  Normal PAP.  Normal atrial size.  Normal valves.    FAMILY HISTORY: Family History  Problem Relation Age of Onset   Lung cancer Mother    Diabetes Mother    Breast cancer Mother 43   Hypertension Father    Cancer Maternal Aunt    Ovarian cancer Maternal Aunt    Diabetes Maternal Aunt    Diabetes Maternal Uncle    Hypertension Maternal Grandmother    Diabetes Maternal Grandmother     ADVANCED DIRECTIVES (Y/N):  N  HEALTH MAINTENANCE: Social History   Tobacco Use   Smoking status: Every Day    Current packs/day: 1.00    Average packs/day: 1 pack/day for 34.0 years (34.0  ttl pk-yrs)    Types: Cigarettes   Smokeless tobacco: Never   Tobacco comments:    2-3 cig/day  Vaping Use   Vaping status: Never Used  Substance Use Topics   Alcohol use: Yes    Comment: Occ   Drug use: Not Currently     Colonoscopy:  PAP:  Bone density:  Lipid panel:  Allergies  Allergen Reactions   Azithromycin Anaphylaxis   Molds & Smuts Cough   Alpha-Gal Other (See Comments)   Penicillins     Did it  involve swelling of the face/tongue/throat, SOB, or low BP? Yes Did it involve sudden or severe rash/hives, skin peeling, or any reaction on the inside of your mouth or nose? No Did you need to seek medical attention at a hospital or doctor's office? No When did it last happen?       If all above answers are "NO", may proceed with cephalosporin use.    Shellfish Allergy     Current Outpatient Medications  Medication Sig Dispense Refill   Acidophilus Lactobacillus CAPS Take 1 tablet by mouth daily.     albuterol (VENTOLIN HFA) 108 (90 Base) MCG/ACT inhaler      amLODipine (NORVASC) 10 MG tablet Take 1 tablet (10 mg total) by mouth daily. 90 tablet 4   Blood Glucose Monitoring Suppl (ONETOUCH VERIO) w/Device KIT Use to check blood sugar 3 times daily with goal <130 fasting and <180 two hours after meal.  Bring meter to visits. 1 kit 0   cetirizine (ZYRTEC) 10 MG tablet Take 1 tablet (10 mg total) by mouth daily. 90 tablet 11   cloNIDine (CATAPRES) 0.1 MG tablet TAKE 1 TABLET BY MOUTH TWICE DAILY 60 tablet 0   cyclobenzaprine (FLEXERIL) 5 MG tablet Take 1 tablet every day by oral route at bedtime.     dapagliflozin propanediol (FARXIGA) 10 MG TABS tablet Take 1 tablet (10 mg total) by mouth daily before breakfast. 90 tablet 4   EPINEPHrine 0.3 mg/0.3 mL IJ SOAJ injection Inject 0.3 mg into the muscle as needed for anaphylaxis. 1 each 4   glucose blood test strip Use to check blood sugar 3 times daily with goal <130 fasting and <180 two hours after meal. 100 each 12   golimumab (SIMPONI ARIA) 50 MG/4ML SOLN injection Inject into the vein.     hydrochlorothiazide (HYDRODIURIL) 25 MG tablet Take 25 mg by mouth daily.     HYDROcodone bit-homatropine (HYCODAN) 5-1.5 MG/5ML syrup Take 5 mLs by mouth every 6 (six) hours as needed for cough. 120 mL 0   hydrOXYzine (VISTARIL) 25 MG capsule TAKE 1 CAPSULE BY MOUTH EVERY 8 HOURS ASNEEDED 60 capsule 0   ibuprofen (ADVIL) 600 MG tablet Take 1 tablet (600 mg  total) by mouth every 8 (eight) hours as needed. 30 tablet 0   ipratropium (ATROVENT) 0.06 % nasal spray Place 2 sprays into both nostrils 4 (four) times daily. 15 mL 12   ipratropium-albuterol (DUONEB) 0.5-2.5 (3) MG/3ML SOLN Take 3 mLs by nebulization every 6 (six) hours as needed for up to 7 days. 360 mL 0   Lancets (ONETOUCH ULTRASOFT) lancets Use to check blood sugar 3 times daily with goal <130 fasting and <180 two hours after meal. 100 each 12   Lancets (ONETOUCH ULTRASOFT) lancets Use as directed 60 each 5   leflunomide (ARAVA) 20 MG tablet Take 20 mg by mouth daily. (Patient not taking: Reported on 09/07/2023)     levothyroxine (SYNTHROID) 50 MCG tablet Take  50 mcg by mouth daily before breakfast.      losartan (COZAAR) 100 MG tablet Take 100 mg by mouth daily.     metFORMIN (GLUCOPHAGE-XR) 500 MG 24 hr tablet  (Patient not taking: Reported on 09/07/2023)     montelukast (SINGULAIR) 10 MG tablet Take 10 mg by mouth daily.     potassium chloride SA (KLOR-CON M) 20 MEQ tablet Take 1 tablet (20 mEq total) by mouth 2 (two) times daily for 5 days. 10 tablet 0   TRELEGY ELLIPTA 200-62.5-25 MCG/ACT AEPB Inhale 1 puff into the lungs daily.     No current facility-administered medications for this visit.    OBJECTIVE: There were no vitals filed for this visit.    There is no height or weight on file to calculate BMI.    ECOG FS:0 - Asymptomatic  General: Well-developed, well-nourished, no acute distress. HEENT: Normocephalic. Neuro: Alert, answering all questions appropriately. Cranial nerves grossly intact. Psych: Normal affect.  LAB RESULTS:  Lab Results  Component Value Date   NA 138 09/07/2023   K 3.5 09/07/2023   CL 106 09/07/2023   CO2 23 09/07/2023   GLUCOSE 116 (H) 09/07/2023   BUN 13 09/07/2023   CREATININE 0.35 (L) 09/07/2023   CALCIUM 9.1 09/07/2023   PROT 7.5 09/07/2023   ALBUMIN 3.8 09/07/2023   AST 13 (L) 09/07/2023   ALT 16 09/07/2023   ALKPHOS 69 09/07/2023    BILITOT 0.4 09/07/2023   GFRNONAA >60 09/07/2023   GFRAA 132 10/12/2020    Lab Results  Component Value Date   WBC 15.0 (H) 09/07/2023   NEUTROABS 6.9 09/07/2023   HGB 15.1 (H) 09/07/2023   HCT 46.8 (H) 09/07/2023   MCV 94.9 09/07/2023   PLT 326 09/07/2023     STUDIES: DG Clavicle Right  Result Date: 09/14/2023 CLINICAL DATA:  Pain post fall EXAM: RIGHT CLAVICLE - 2+ VIEWS COMPARISON:  CT 08/14/2023 FINDINGS: There is no evidence of fracture or other focal bone lesions. Soft tissues are unremarkable. IMPRESSION: Negative. Electronically Signed   By: Corlis Leak M.D.   On: 09/14/2023 10:28   DG Cervical Spine 2-3 Views  Result Date: 09/14/2023 CLINICAL DATA:  Pain post fall EXAM: CERVICAL SPINE - 2-3 VIEW COMPARISON:  CT 02/12/2023 FINDINGS: mild reversal of the normal cervical lordosis. Anterior endplate spurring [id number] with narrowing of interspaces. No acute fracture or dislocation. No prevertebral soft tissue swelling. Right carotid bifurcation calcifications. Dental restorations. IMPRESSION: 1. No acute findings. 2. C4-C7 degenerative disc disease. Electronically Signed   By: Corlis Leak M.D.   On: 09/14/2023 10:27   DG Lumbar Spine 2-3 Views  Result Date: 09/14/2023 CLINICAL DATA:  Larey Seat 2 days ago, back soreness EXAM: LUMBAR SPINE - 2-3 VIEW COMPARISON:  CT 02/12/2023 FINDINGS: There is no evidence of lumbar spine fracture. Facet DJD L5-S1 allowing grade 1 anterolisthesis, stable. Intervertebral disc spaces are maintained. IMPRESSION: 1. No acute findings. 2. L5-S1 facet DJD with grade 1 anterolisthesis. Electronically Signed   By: Corlis Leak M.D.   On: 09/14/2023 10:24   DG Pelvis 1-2 Views  Result Date: 09/14/2023 CLINICAL DATA:  Fall backwards 2 days ago EXAM: PELVIS - 1 VIEW COMPARISON:  None Available. FINDINGS: There is no evidence of pelvic fracture or diastasis. No pelvic bone lesions are seen. Degenerative changes of the bilateral hips. IMPRESSION: 1. No acute fracture or  dislocation. 2. Degenerative changes of the bilateral hips. Electronically Signed   By: Agustin Cree M.D.   On:  09/14/2023 10:23    ASSESSMENT: Elevated D-dimer.  PLAN:    Elevated D-dimer: Likely secondary to patient's ongoing acute issues during blood draw, therefore likely clinically insignificant.  She has no evidence of blood clot.  CT scan of the chest on August 14, 2023 and CT scan of the abdomen and pelvis on December 25, 2022 revealed no significant pathology. Left breast pain: Patient does not complain of this today.  Patient was previously instructed to schedule mammogram that was ordered by another physician. Family history of blood clots: Unclear on specifics.  Factor V Leiden and prothrombin gene mutation are negative.   Leukocytosis: Likely reactive. Disposition: No further follow-up is necessary.  I provided 20 minutes of face-to-face video visit time during this encounter which included chart review, counseling, and coordination of care as documented above.    Patient expressed understanding and was in agreement with this plan. She also understands that She can call clinic at any time with any questions, concerns, or complaints.    Jeralyn Ruths, MD   09/27/2023 3:26 PM

## 2023-09-29 IMAGING — CR DG CHEST 2V
2 series · 2 of 2 positions shown · non-contrast
Comparison: Chest x-ray dated March 30, 2021

CLINICAL DATA: Shortness of breath

EXAM:
CHEST - 2 VIEW

[chest pa]
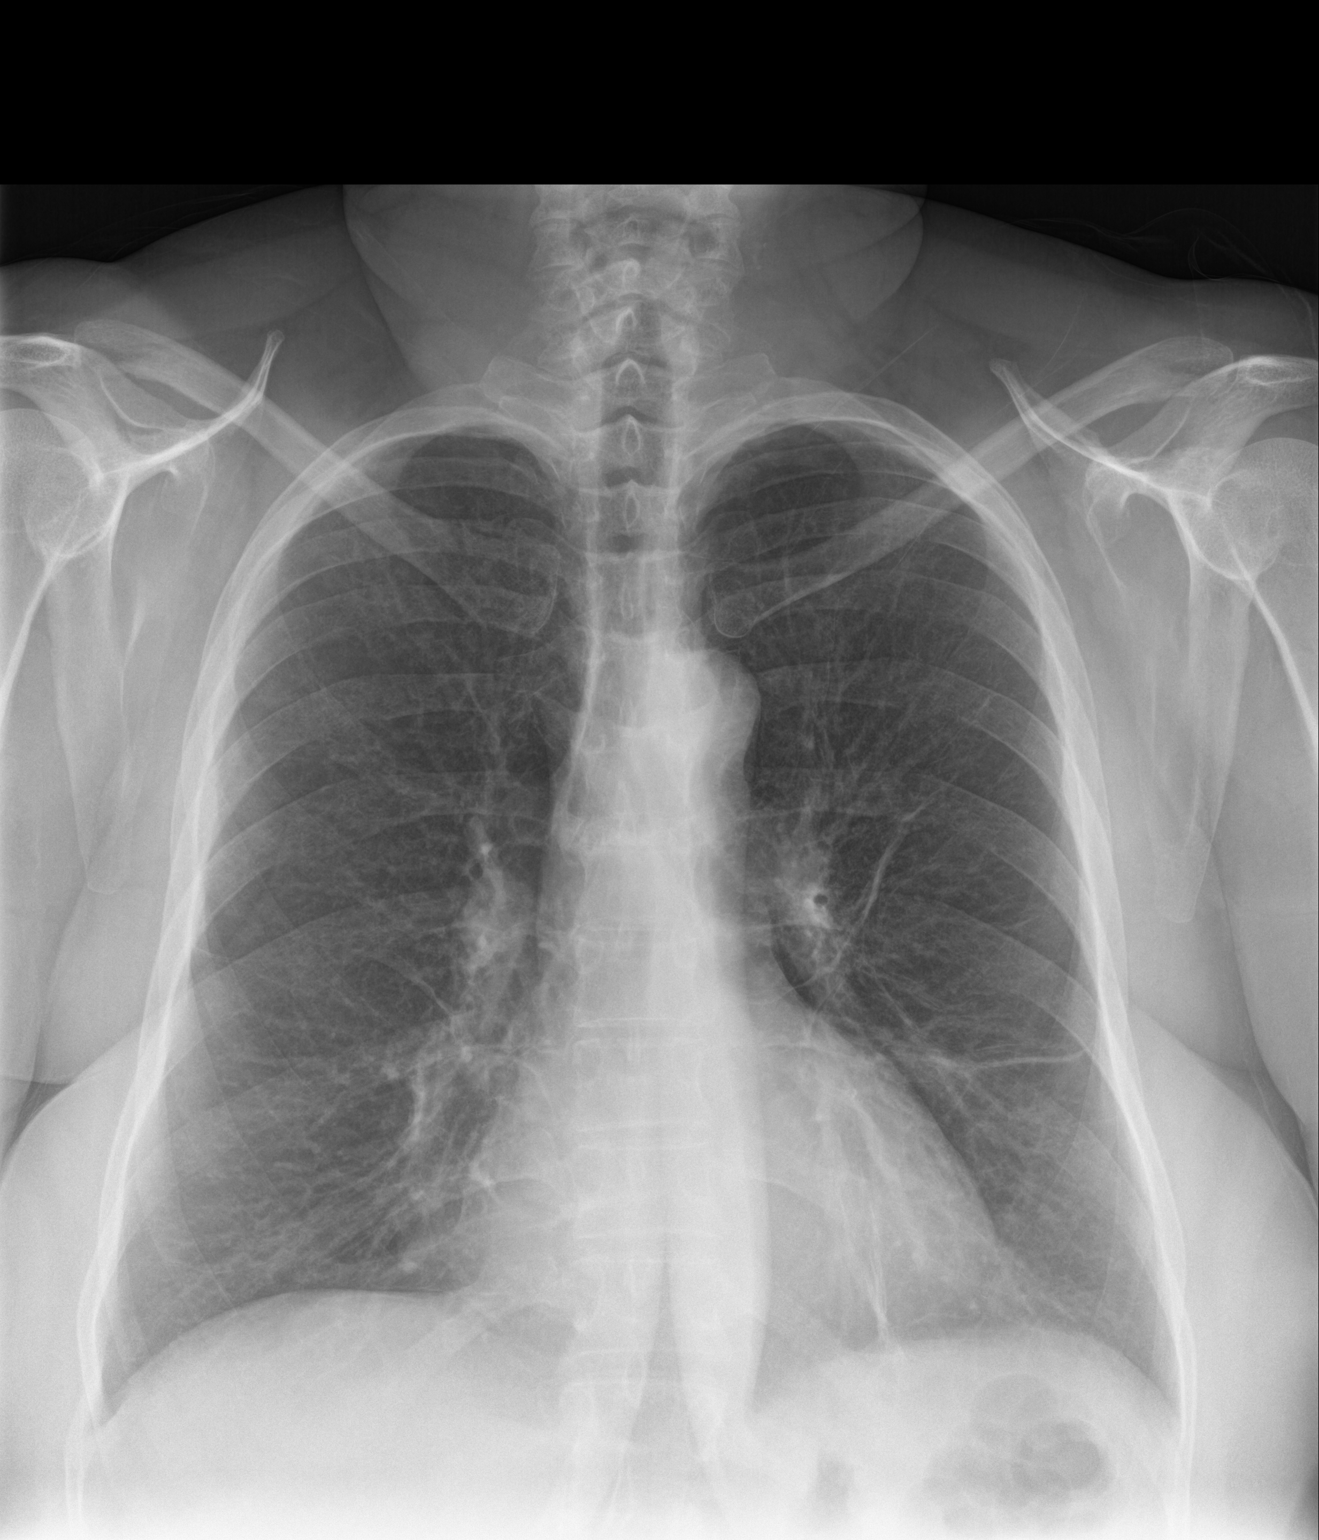

[chest lat]
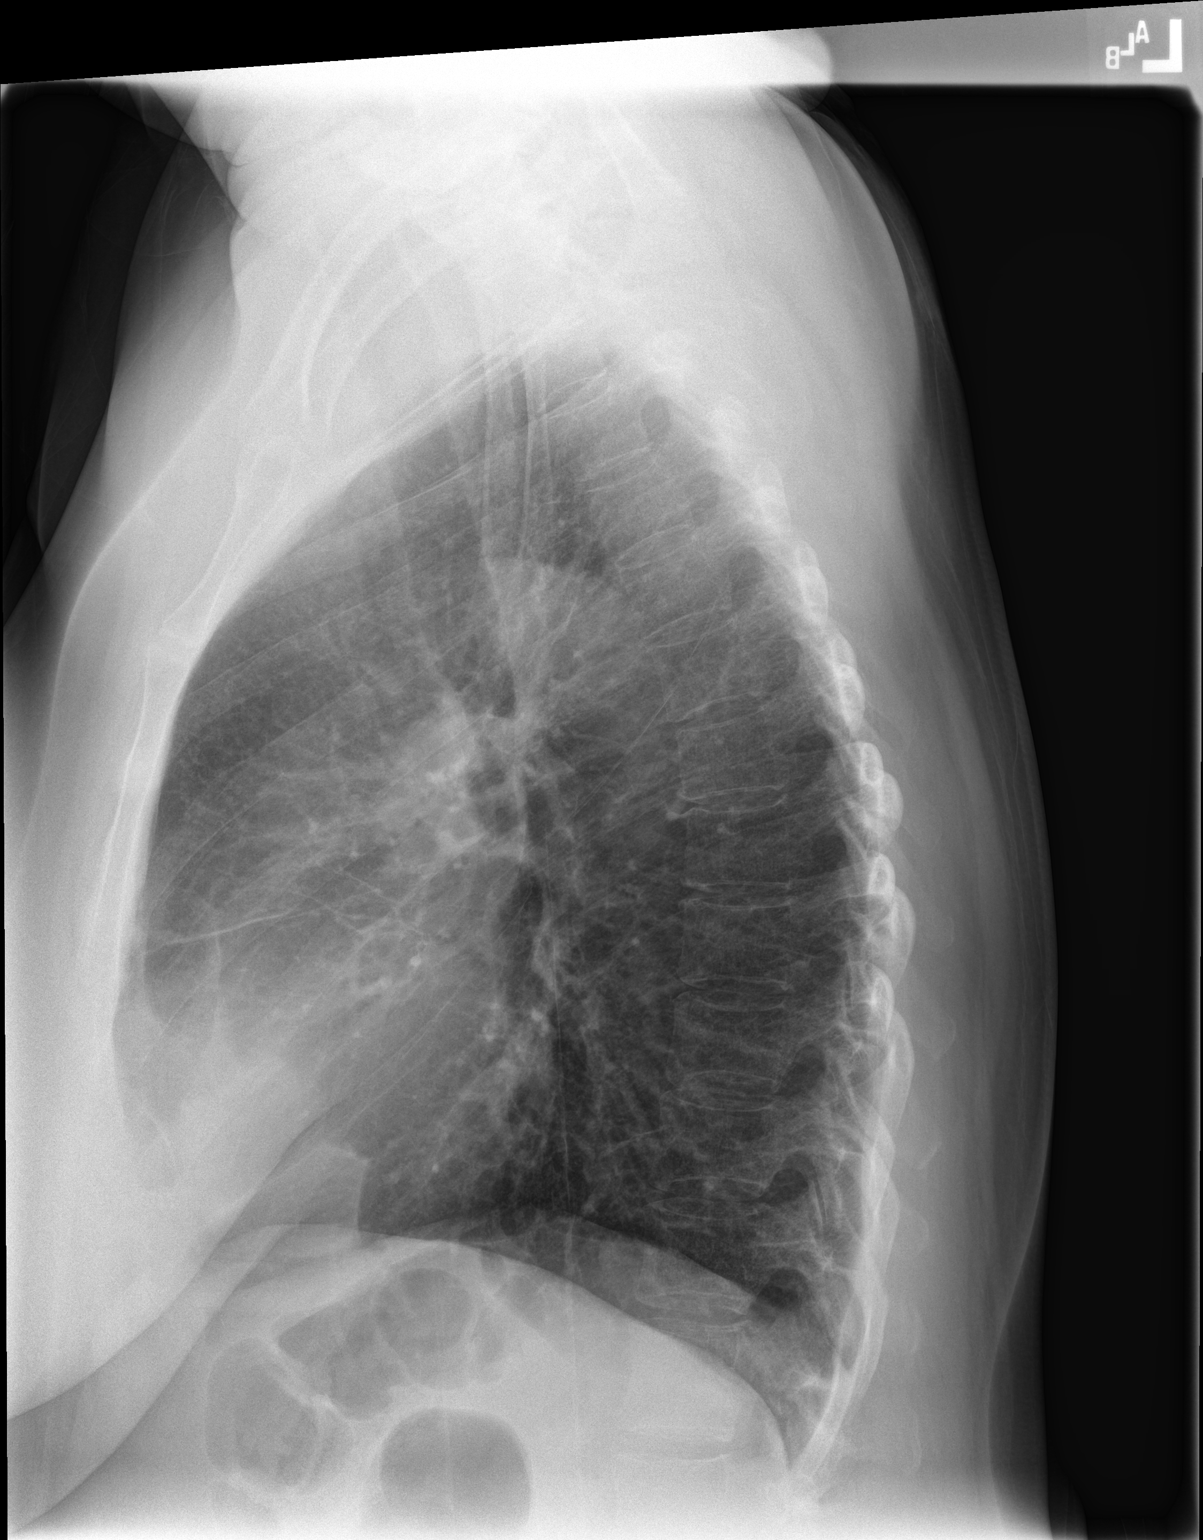

[2 of 2 positions shown; findings below may reference images not displayed]

FINDINGS: Cardiac and mediastinal contours are unchanged within normal limits.
Similar left greater than right bilateral linear opacities, likely
due to scarring or atelectasis. Lungs are otherwise clear. No
pleural effusion or pneumothorax.
IMPRESSION: No active cardiopulmonary disease.

## 2023-10-03 ENCOUNTER — Other Ambulatory Visit: Payer: Self-pay | Admitting: Physician Assistant

## 2023-10-03 DIAGNOSIS — J188 Other pneumonia, unspecified organism: Secondary | ICD-10-CM

## 2023-10-22 ENCOUNTER — Emergency Department: Payer: 59

## 2023-10-22 ENCOUNTER — Other Ambulatory Visit: Payer: Self-pay

## 2023-10-22 DIAGNOSIS — M069 Rheumatoid arthritis, unspecified: Secondary | ICD-10-CM | POA: Diagnosis present

## 2023-10-22 DIAGNOSIS — J441 Chronic obstructive pulmonary disease with (acute) exacerbation: Secondary | ICD-10-CM | POA: Diagnosis present

## 2023-10-22 DIAGNOSIS — Z809 Family history of malignant neoplasm, unspecified: Secondary | ICD-10-CM

## 2023-10-22 DIAGNOSIS — J9611 Chronic respiratory failure with hypoxia: Secondary | ICD-10-CM | POA: Diagnosis present

## 2023-10-22 DIAGNOSIS — F17210 Nicotine dependence, cigarettes, uncomplicated: Secondary | ICD-10-CM | POA: Diagnosis present

## 2023-10-22 DIAGNOSIS — F32A Depression, unspecified: Secondary | ICD-10-CM | POA: Diagnosis present

## 2023-10-22 DIAGNOSIS — I10 Essential (primary) hypertension: Secondary | ICD-10-CM | POA: Diagnosis present

## 2023-10-22 DIAGNOSIS — Z1152 Encounter for screening for COVID-19: Secondary | ICD-10-CM

## 2023-10-22 DIAGNOSIS — Z91013 Allergy to seafood: Secondary | ICD-10-CM

## 2023-10-22 DIAGNOSIS — J439 Emphysema, unspecified: Secondary | ICD-10-CM | POA: Diagnosis present

## 2023-10-22 DIAGNOSIS — Z7951 Long term (current) use of inhaled steroids: Secondary | ICD-10-CM

## 2023-10-22 DIAGNOSIS — E039 Hypothyroidism, unspecified: Secondary | ICD-10-CM | POA: Diagnosis present

## 2023-10-22 DIAGNOSIS — Z803 Family history of malignant neoplasm of breast: Secondary | ICD-10-CM

## 2023-10-22 DIAGNOSIS — A419 Sepsis, unspecified organism: Principal | ICD-10-CM | POA: Diagnosis present

## 2023-10-22 DIAGNOSIS — Z833 Family history of diabetes mellitus: Secondary | ICD-10-CM

## 2023-10-22 DIAGNOSIS — J189 Pneumonia, unspecified organism: Secondary | ICD-10-CM | POA: Diagnosis present

## 2023-10-22 DIAGNOSIS — E872 Acidosis, unspecified: Secondary | ICD-10-CM | POA: Diagnosis present

## 2023-10-22 DIAGNOSIS — M797 Fibromyalgia: Secondary | ICD-10-CM | POA: Diagnosis present

## 2023-10-22 DIAGNOSIS — E669 Obesity, unspecified: Secondary | ICD-10-CM | POA: Diagnosis present

## 2023-10-22 DIAGNOSIS — Z7984 Long term (current) use of oral hypoglycemic drugs: Secondary | ICD-10-CM

## 2023-10-22 DIAGNOSIS — J440 Chronic obstructive pulmonary disease with acute lower respiratory infection: Secondary | ICD-10-CM | POA: Diagnosis present

## 2023-10-22 DIAGNOSIS — Z8041 Family history of malignant neoplasm of ovary: Secondary | ICD-10-CM

## 2023-10-22 DIAGNOSIS — Z801 Family history of malignant neoplasm of trachea, bronchus and lung: Secondary | ICD-10-CM

## 2023-10-22 DIAGNOSIS — Z880 Allergy status to penicillin: Secondary | ICD-10-CM

## 2023-10-22 DIAGNOSIS — Z91048 Other nonmedicinal substance allergy status: Secondary | ICD-10-CM

## 2023-10-22 DIAGNOSIS — R6520 Severe sepsis without septic shock: Secondary | ICD-10-CM | POA: Diagnosis present

## 2023-10-22 DIAGNOSIS — Z6830 Body mass index (BMI) 30.0-30.9, adult: Secondary | ICD-10-CM

## 2023-10-22 DIAGNOSIS — Z8249 Family history of ischemic heart disease and other diseases of the circulatory system: Secondary | ICD-10-CM

## 2023-10-22 DIAGNOSIS — D849 Immunodeficiency, unspecified: Secondary | ICD-10-CM | POA: Diagnosis present

## 2023-10-22 DIAGNOSIS — Z881 Allergy status to other antibiotic agents status: Secondary | ICD-10-CM

## 2023-10-22 DIAGNOSIS — Z79890 Hormone replacement therapy (postmenopausal): Secondary | ICD-10-CM

## 2023-10-22 DIAGNOSIS — E1165 Type 2 diabetes mellitus with hyperglycemia: Secondary | ICD-10-CM | POA: Diagnosis present

## 2023-10-22 DIAGNOSIS — E876 Hypokalemia: Secondary | ICD-10-CM | POA: Diagnosis present

## 2023-10-22 DIAGNOSIS — Z79899 Other long term (current) drug therapy: Secondary | ICD-10-CM

## 2023-10-22 LAB — CBC
HCT: 40.4 % (ref 36.0–46.0)
Hemoglobin: 13.4 g/dL (ref 12.0–15.0)
MCH: 31.1 pg (ref 26.0–34.0)
MCHC: 33.2 g/dL (ref 30.0–36.0)
MCV: 93.7 fL (ref 80.0–100.0)
Platelets: 336 10*3/uL (ref 150–400)
RBC: 4.31 MIL/uL (ref 3.87–5.11)
RDW: 14.6 % (ref 11.5–15.5)
WBC: 21.6 10*3/uL — ABNORMAL HIGH (ref 4.0–10.5)
nRBC: 0 % (ref 0.0–0.2)

## 2023-10-22 LAB — BASIC METABOLIC PANEL
Anion gap: 13 (ref 5–15)
BUN: 7 mg/dL (ref 6–20)
CO2: 23 mmol/L (ref 22–32)
Calcium: 8.3 mg/dL — ABNORMAL LOW (ref 8.9–10.3)
Chloride: 99 mmol/L (ref 98–111)
Creatinine, Ser: 0.45 mg/dL (ref 0.44–1.00)
GFR, Estimated: >60 mL/min (ref >60)
Glucose, Bld: 161 mg/dL — ABNORMAL HIGH (ref 70–99)
Potassium: 3 mmol/L — ABNORMAL LOW (ref 3.5–5.1)
Sodium: 135 mmol/L (ref 135–145)

## 2023-10-22 LAB — RESP PANEL BY RT-PCR (RSV, FLU A&B, COVID)  RVPGX2
Influenza A by PCR: NEGATIVE
Influenza B by PCR: NEGATIVE
Resp Syncytial Virus by PCR: NEGATIVE
SARS Coronavirus 2 by RT PCR: NEGATIVE

## 2023-10-22 LAB — TROPONIN I (HIGH SENSITIVITY): Troponin I (High Sensitivity): 7 ng/L (ref <18)

## 2023-10-22 NOTE — ED Triage Notes (Addendum)
Pt to ED via [facility] c/o dizziness, weakness, SHOB. Started this morning. Pt alsos reports she has had cough, congestion x 2months had pneumonia. Pt took full course of abx. Pt also complaining of substernal CP. Given tylenol by EMS. Hx COPD. Placed on 2L for comfort, uses 2L at home as needed

## 2023-10-22 NOTE — ED Triage Notes (Signed)
EMS brings pt in from home for "not feeling well"

## 2023-10-23 ENCOUNTER — Other Ambulatory Visit: Payer: Self-pay

## 2023-10-23 ENCOUNTER — Emergency Department: Payer: 59

## 2023-10-23 ENCOUNTER — Inpatient Hospital Stay
Admission: EM | Admit: 2023-10-23 | Discharge: 2023-10-26 | DRG: 871 | Disposition: A | Discharge status: Home / Self Care | Payer: 59 | Attending: Internal Medicine | Admitting: Internal Medicine

## 2023-10-23 DIAGNOSIS — A419 Sepsis, unspecified organism: Principal | ICD-10-CM | POA: Diagnosis present

## 2023-10-23 DIAGNOSIS — E669 Obesity, unspecified: Secondary | ICD-10-CM | POA: Diagnosis present

## 2023-10-23 DIAGNOSIS — J440 Chronic obstructive pulmonary disease with acute lower respiratory infection: Secondary | ICD-10-CM | POA: Diagnosis present

## 2023-10-23 DIAGNOSIS — M069 Rheumatoid arthritis, unspecified: Secondary | ICD-10-CM | POA: Diagnosis present

## 2023-10-23 DIAGNOSIS — J189 Pneumonia, unspecified organism: Secondary | ICD-10-CM | POA: Diagnosis present

## 2023-10-23 DIAGNOSIS — I10 Essential (primary) hypertension: Secondary | ICD-10-CM | POA: Diagnosis present

## 2023-10-23 DIAGNOSIS — J9611 Chronic respiratory failure with hypoxia: Secondary | ICD-10-CM | POA: Diagnosis present

## 2023-10-23 DIAGNOSIS — Z6830 Body mass index (BMI) 30.0-30.9, adult: Secondary | ICD-10-CM | POA: Diagnosis not present

## 2023-10-23 DIAGNOSIS — Z881 Allergy status to other antibiotic agents status: Secondary | ICD-10-CM | POA: Diagnosis not present

## 2023-10-23 DIAGNOSIS — M797 Fibromyalgia: Secondary | ICD-10-CM | POA: Diagnosis present

## 2023-10-23 DIAGNOSIS — F32A Depression, unspecified: Secondary | ICD-10-CM | POA: Diagnosis present

## 2023-10-23 DIAGNOSIS — J439 Emphysema, unspecified: Secondary | ICD-10-CM | POA: Diagnosis present

## 2023-10-23 DIAGNOSIS — Z1152 Encounter for screening for COVID-19: Secondary | ICD-10-CM | POA: Diagnosis not present

## 2023-10-23 DIAGNOSIS — E039 Hypothyroidism, unspecified: Secondary | ICD-10-CM | POA: Diagnosis present

## 2023-10-23 DIAGNOSIS — E1165 Type 2 diabetes mellitus with hyperglycemia: Secondary | ICD-10-CM | POA: Diagnosis present

## 2023-10-23 DIAGNOSIS — E876 Hypokalemia: Secondary | ICD-10-CM | POA: Diagnosis present

## 2023-10-23 DIAGNOSIS — J441 Chronic obstructive pulmonary disease with (acute) exacerbation: Secondary | ICD-10-CM | POA: Diagnosis present

## 2023-10-23 DIAGNOSIS — E872 Acidosis, unspecified: Secondary | ICD-10-CM | POA: Diagnosis present

## 2023-10-23 DIAGNOSIS — D849 Immunodeficiency, unspecified: Secondary | ICD-10-CM | POA: Diagnosis present

## 2023-10-23 DIAGNOSIS — Z7984 Long term (current) use of oral hypoglycemic drugs: Secondary | ICD-10-CM | POA: Diagnosis not present

## 2023-10-23 DIAGNOSIS — R6520 Severe sepsis without septic shock: Secondary | ICD-10-CM | POA: Diagnosis present

## 2023-10-23 DIAGNOSIS — E1159 Type 2 diabetes mellitus with other circulatory complications: Secondary | ICD-10-CM

## 2023-10-23 DIAGNOSIS — Z8249 Family history of ischemic heart disease and other diseases of the circulatory system: Secondary | ICD-10-CM | POA: Diagnosis not present

## 2023-10-23 DIAGNOSIS — Z91048 Other nonmedicinal substance allergy status: Secondary | ICD-10-CM | POA: Diagnosis not present

## 2023-10-23 DIAGNOSIS — F17210 Nicotine dependence, cigarettes, uncomplicated: Secondary | ICD-10-CM | POA: Diagnosis present

## 2023-10-23 LAB — STREP PNEUMONIAE URINARY ANTIGEN: Strep Pneumo Urinary Antigen: NEGATIVE

## 2023-10-23 LAB — RESPIRATORY PANEL BY PCR

## 2023-10-23 LAB — LACTIC ACID, PLASMA
Lactic Acid, Venous: 2.4 mmol/L (ref 0.5–1.9)
Lactic Acid, Venous: 2.2 mmol/L (ref 0.5–1.9)
Lactic Acid, Venous: 2.3 mmol/L (ref 0.5–1.9)

## 2023-10-23 LAB — HIV ANTIBODY (ROUTINE TESTING W REFLEX): HIV Screen 4th Generation wRfx: NONREACTIVE

## 2023-10-23 LAB — MAGNESIUM: Magnesium: 2 mg/dL (ref 1.7–2.4)

## 2023-10-23 LAB — TROPONIN I (HIGH SENSITIVITY): Troponin I (High Sensitivity): 7 ng/L (ref <18)

## 2023-10-23 MED ORDER — BENZONATATE 100 MG PO CAPS
100.0000 mg | ORAL_CAPSULE | Freq: Three times a day (TID) | ORAL | Status: DC | PRN
  Administered 2023-10-24: 100 mg via ORAL
  Filled 2023-10-23: qty 1

## 2023-10-23 MED ORDER — ACETAMINOPHEN 650 MG RE SUPP: 650.0000 mg | Freq: Four times a day (QID) | RECTAL | Status: DC | PRN

## 2023-10-23 MED ORDER — MONTELUKAST SODIUM 10 MG PO TABS
10.0000 mg | ORAL_TABLET | Freq: Every day | ORAL | Status: DC
  Administered 2023-10-23 – 2023-10-26 (×4): 10 mg via ORAL
  Filled 2023-10-23 (×4): qty 1

## 2023-10-23 MED ORDER — HYDROXYZINE HCL 25 MG PO TABS: 25.0000 mg | ORAL_TABLET | Freq: Three times a day (TID) | ORAL | Status: DC | PRN

## 2023-10-23 MED ORDER — ALBUTEROL SULFATE (2.5 MG/3ML) 0.083% IN NEBU: 2.5000 mg | INHALATION_SOLUTION | Freq: Four times a day (QID) | RESPIRATORY_TRACT | Status: DC | PRN

## 2023-10-23 MED ORDER — OXYCODONE HCL 5 MG PO TABS
5.0000 mg | ORAL_TABLET | ORAL | Status: DC | PRN
Start: 2023-10-23 — End: 2023-10-26
  Administered 2023-10-23 – 2023-10-26 (×6): 5 mg via ORAL
  Filled 2023-10-23 (×6): qty 1

## 2023-10-23 MED ORDER — LEVOFLOXACIN IN D5W 750 MG/150ML IV SOLN
750.0000 mg | Freq: Once | INTRAVENOUS | Status: AC
  Administered 2023-10-23: 750 mg via INTRAVENOUS
  Filled 2023-10-23: qty 150

## 2023-10-23 MED ORDER — LEVOFLOXACIN IN D5W 750 MG/150ML IV SOLN
750.0000 mg | INTRAVENOUS | Status: DC
  Administered 2023-10-24 – 2023-10-26 (×3): 750 mg via INTRAVENOUS
  Filled 2023-10-23 (×3): qty 150

## 2023-10-23 MED ORDER — ONDANSETRON HCL 4 MG/2ML IJ SOLN: 4.0000 mg | Freq: Four times a day (QID) | INTRAMUSCULAR | Status: DC | PRN

## 2023-10-23 MED ORDER — SODIUM CHLORIDE 0.9 % IV SOLN: 500.0000 mg | INTRAVENOUS | Status: DC

## 2023-10-23 MED ORDER — KETOROLAC TROMETHAMINE 15 MG/ML IJ SOLN
15.0000 mg | Freq: Four times a day (QID) | INTRAMUSCULAR | Status: DC | PRN
  Administered 2023-10-23 – 2023-10-25 (×3): 15 mg via INTRAVENOUS
  Filled 2023-10-23 (×3): qty 1

## 2023-10-23 MED ORDER — HYDRALAZINE HCL 20 MG/ML IJ SOLN: 5.0000 mg | Freq: Four times a day (QID) | INTRAMUSCULAR | Status: DC | PRN

## 2023-10-23 MED ORDER — POTASSIUM CHLORIDE CRYS ER 20 MEQ PO TBCR
40.0000 meq | EXTENDED_RELEASE_TABLET | ORAL | Status: DC
  Administered 2023-10-23: 40 meq via ORAL
  Filled 2023-10-23: qty 2

## 2023-10-23 MED ORDER — ENOXAPARIN SODIUM 40 MG/0.4ML IJ SOSY: 40.0000 mg | PREFILLED_SYRINGE | INTRAMUSCULAR | Status: DC

## 2023-10-23 MED ORDER — POTASSIUM CHLORIDE CRYS ER 20 MEQ PO TBCR
40.0000 meq | EXTENDED_RELEASE_TABLET | ORAL | Status: AC
  Administered 2023-10-23: 40 meq via ORAL
  Filled 2023-10-23: qty 2

## 2023-10-23 MED ORDER — ONDANSETRON HCL 4 MG PO TABS: 4.0000 mg | ORAL_TABLET | Freq: Four times a day (QID) | ORAL | Status: DC | PRN

## 2023-10-23 MED ORDER — LEVOTHYROXINE SODIUM 50 MCG PO TABS
50.0000 ug | ORAL_TABLET | Freq: Every day | ORAL | Status: DC
  Administered 2023-10-23 – 2023-10-26 (×4): 50 ug via ORAL
  Filled 2023-10-23 (×4): qty 1

## 2023-10-23 MED ORDER — METOPROLOL TARTRATE 5 MG/5ML IV SOLN
5.0000 mg | Freq: Once | INTRAVENOUS | Status: DC
  Filled 2023-10-23: qty 5

## 2023-10-23 MED ORDER — CYCLOBENZAPRINE HCL 10 MG PO TABS
5.0000 mg | ORAL_TABLET | Freq: Every day | ORAL | Status: DC
  Administered 2023-10-23 – 2023-10-25 (×3): 5 mg via ORAL
  Filled 2023-10-23 (×3): qty 1

## 2023-10-23 MED ORDER — UMECLIDINIUM BROMIDE 62.5 MCG/ACT IN AEPB
1.0000 | INHALATION_SPRAY | Freq: Every day | RESPIRATORY_TRACT | Status: DC
  Administered 2023-10-24 – 2023-10-26 (×3): 1 via RESPIRATORY_TRACT
  Filled 2023-10-23: qty 7

## 2023-10-23 MED ORDER — IOHEXOL 350 MG/ML SOLN
100.0000 mL | Freq: Once | INTRAVENOUS | Status: AC | PRN
  Administered 2023-10-23: 100 mL via INTRAVENOUS

## 2023-10-23 MED ORDER — AMLODIPINE BESYLATE 5 MG PO TABS
5.0000 mg | ORAL_TABLET | Freq: Every day | ORAL | Status: DC
  Administered 2023-10-24 – 2023-10-26 (×3): 5 mg via ORAL
  Filled 2023-10-23 (×3): qty 1

## 2023-10-23 MED ORDER — SODIUM CHLORIDE 0.9 % IV SOLN: 2.0000 g | INTRAVENOUS | Status: DC

## 2023-10-23 MED ORDER — IPRATROPIUM BROMIDE 0.06 % NA SOLN
2.0000 | Freq: Four times a day (QID) | NASAL | Status: DC
  Administered 2023-10-24 (×2): 2 via NASAL
  Filled 2023-10-23: qty 15

## 2023-10-23 MED ORDER — ACETAMINOPHEN 325 MG PO TABS: 650.0000 mg | ORAL_TABLET | Freq: Four times a day (QID) | ORAL | Status: DC | PRN

## 2023-10-23 MED ORDER — ALBUTEROL SULFATE HFA 108 (90 BASE) MCG/ACT IN AERS: 1.0000 | INHALATION_SPRAY | Freq: Four times a day (QID) | RESPIRATORY_TRACT | Status: DC | PRN

## 2023-10-23 MED ORDER — FONDAPARINUX SODIUM 2.5 MG/0.5ML ~~LOC~~ SOLN
2.5000 mg | SUBCUTANEOUS | Status: DC
  Filled 2023-10-23 (×3): qty 0.5

## 2023-10-23 MED ORDER — FLUTICASONE FUROATE-VILANTEROL 200-25 MCG/ACT IN AEPB
1.0000 | INHALATION_SPRAY | Freq: Every day | RESPIRATORY_TRACT | Status: DC
  Administered 2023-10-24 – 2023-10-26 (×3): 1 via RESPIRATORY_TRACT
  Filled 2023-10-23: qty 28

## 2023-10-23 MED ORDER — HYDROCODONE BIT-HOMATROP MBR 5-1.5 MG/5ML PO SOLN: 5.0000 mL | Freq: Four times a day (QID) | ORAL | Status: DC | PRN

## 2023-10-23 MED ORDER — LORATADINE 10 MG PO TABS
10.0000 mg | ORAL_TABLET | Freq: Every day | ORAL | Status: DC
  Administered 2023-10-23 – 2023-10-26 (×4): 10 mg via ORAL
  Filled 2023-10-23 (×4): qty 1

## 2023-10-23 MED ORDER — PREDNISONE 20 MG PO TABS
40.0000 mg | ORAL_TABLET | Freq: Every day | ORAL | Status: DC
  Administered 2023-10-23 – 2023-10-26 (×4): 40 mg via ORAL
  Filled 2023-10-23 (×4): qty 2

## 2023-10-23 NOTE — H&P (Signed)
 History and Physical    Catherine Giles FMW:[id number] DOB: 03-25-1971 DOA: 10/23/2023  PCP: [facility] (Confirm with patient/family/NH records and if not entered, this has to be entered at [facility] point of entry) Patient coming from: Home  I have personally briefly reviewed patient's old medical records in [facility]  Chief Complaint: Cough, chest pain, fever  HPI: Catherine Giles is a 52 y.o. female with medical history significant of RA on DMARDs, asthma/COPD Gold stage II, HTN, lupus, hypothyroidism, presented with worsening of cough, chest pain, fever.  Started dry cough about 4 weeks ago, worse on daytime, with occasional wheezing, no sputum production.  She completed 1 week course of antibiotics about 2 weeks ago with some improvement.  However last week the cough has become worse, remains a dry cough and last couple days she started to develop bilateral sharp-like chest pain worsening with cough and deep breath.  Last night she started to have fever and chills and she measured her temperature was 103 at home.  Feeling nausea but no vomiting denies any urinary symptoms or diarrhea.  Denied any sick contact.  ED Course: Temperature 98.6, heart rate 120, tachypneic breathing rate 24, blood pressure 120/60, O2 saturation 94% on 2 L.  Chest x-ray negative for acute findings, CTA chest showed bilateral perihilar lung mass multifocal, infectious versus inflammatory process, consider atypical pathogen, recommend repeat CT in 3 months. Negative for PE.  Blood work showed WBC 21.6, hemoglobin 13, lactic acid 2.4> 2.2, K3.0, creatinine 0.4 glucose 161  Patient was given Levaquin  x 1 in the ED.  Review of Systems: As per HPI otherwise 14 point review of systems negative.    Past Medical History:  Diagnosis Date   Arthritis    RA   Asthma    COPD (chronic obstructive pulmonary disease) (HCC)    Depression    Diabetes mellitus without complication (HCC)     Emphysema of lung (HCC)    Family history of breast cancer    6/21 cancer genetic testing letter sent   Fibromyalgia    Hypertension    Lupus    Pre-diabetes    Thyroid  disease     Past Surgical History:  Procedure Laterality Date   CHOLECYSTECTOMY, LAPAROSCOPIC  02/2022   MOUTH SURGERY     TRANSTHORACIC ECHOCARDIOGRAM  04/2020   EF 60 to 65%.  Normal LV function.  No R WMA.  GR 1 DD.  Normal RV size and function.  Normal PAP.  Normal atrial size.  Normal valves.     reports that she has been smoking cigarettes. She has a 34 pack-year smoking history. She has never used smokeless tobacco. She reports current alcohol use. She reports that she does not currently use drugs.  Allergies  Allergen Reactions   Azithromycin Anaphylaxis   Molds & Smuts Cough   Alpha-Gal Other (See Comments)   Penicillins     Did it involve swelling of the face/tongue/throat, SOB, or low BP? Yes Did it involve sudden or severe rash/hives, skin peeling, or any reaction on the inside of your mouth or nose? No Did you need to seek medical attention at a hospital or doctor's office? No When did it last happen?       If all above answers are "NO", may proceed with cephalosporin use.    Shellfish Allergy     Family History  Problem Relation Age of Onset   Lung cancer Mother    Diabetes Mother  Breast cancer Mother 31   Hypertension Father    Cancer Maternal Aunt    Ovarian cancer Maternal Aunt    Diabetes Maternal Aunt    Diabetes Maternal Uncle    Hypertension Maternal Grandmother    Diabetes Maternal Grandmother      Prior to Admission medications   Medication Sig Start Date End Date Taking? Authorizing Provider  Acidophilus Lactobacillus CAPS Take 1 tablet by mouth daily.    [provider]  albuterol  (VENTOLIN  HFA) 108 (90 Base) MCG/ACT inhaler     [provider]  amLODipine  (NORVASC ) 10 MG tablet Take 1 tablet (10 mg total) by mouth daily. 09/05/21   Cannady, Jolene  T, NP  Blood Glucose Monitoring Suppl (ONETOUCH VERIO) w/Device KIT Use to check blood sugar 3 times daily with goal <130 fasting and <180 two hours after meal.  Bring meter to visits. 01/25/21   Cannady, Jolene T, NP  cetirizine  (ZYRTEC ) 10 MG tablet Take 1 tablet (10 mg total) by mouth daily. 01/03/22   Daphane Rosella, NP  cloNIDine  (CATAPRES ) 0.1 MG tablet TAKE 1 TABLET BY MOUTH TWICE DAILY 05/23/23   Gollan, Timothy J, MD  cyclobenzaprine  (FLEXERIL ) 5 MG tablet Take 1 tablet every day by oral route at bedtime. 05/16/23   [provider]  dapagliflozin  propanediol (FARXIGA ) 10 MG TABS tablet Take 1 tablet (10 mg total) by mouth daily before breakfast. 03/16/21   Cannady, Jolene T, NP  EPINEPHrine  0.3 mg/0.3 mL IJ SOAJ injection Inject 0.3 mg into the muscle as needed for anaphylaxis. 02/15/22   Valerio Moris T, NP  glucose blood test strip Use to check blood sugar 3 times daily with goal <130 fasting and <180 two hours after meal. 01/25/21   Cannady, Jolene T, NP  golimumab  (SIMPONI  ARIA) 50 MG/4ML SOLN injection Inject into the vein.    [provider]  hydrochlorothiazide  (HYDRODIURIL ) 25 MG tablet Take 25 mg by mouth daily. 01/22/23   [provider]  HYDROcodone  bit-homatropine (HYCODAN) 5-1.5 MG/5ML syrup Take 5 mLs by mouth every 6 (six) hours as needed for cough. 08/15/23   Menshew, Candida LULLA Kings, PA-C  hydrOXYzine  (VISTARIL ) 25 MG capsule TAKE 1 CAPSULE BY MOUTH EVERY 8 HOURS ASNEEDED 01/03/22   Daphane Rosella, NP  ibuprofen  (ADVIL ) 600 MG tablet Take 1 tablet (600 mg total) by mouth every 8 (eight) hours as needed. 10/10/22   Patel, Sona, MD  ipratropium (ATROVENT ) 0.06 % nasal spray Place 2 sprays into both nostrils 4 (four) times daily. 03/20/23   Bernardino Ditch, NP  ipratropium-albuterol  (DUONEB) 0.5-2.5 (3) MG/3ML SOLN Take 3 mLs by nebulization every 6 (six) hours as needed for up to 7 days. 10/30/21 09/07/23  Arvis Huxley B, PA-C  Lancets [facility] ULTRASOFT) lancets  Use to check blood sugar 3 times daily with goal <130 fasting and <180 two hours after meal. 01/25/21   Cannady, Jolene T, NP  Lancets (ONETOUCH ULTRASOFT) lancets Use as directed 03/21/23   Cyrena Mylar, MD  leflunomide  (ARAVA ) 20 MG tablet Take 20 mg by mouth daily. Patient not taking: Reported on 09/07/2023 01/22/21   [provider]  levothyroxine  (SYNTHROID ) 50 MCG tablet Take 50 mcg by mouth daily before breakfast.     [provider]  losartan  (COZAAR ) 100 MG tablet Take 100 mg by mouth daily.    [provider]  metFORMIN  (GLUCOPHAGE -XR) 500 MG 24 hr tablet  05/08/23   [provider]  montelukast  (SINGULAIR ) 10 MG tablet Take 10 mg by  mouth daily. 02/15/22   [provider]  potassium chloride  SA (KLOR-CON  M) 20 MEQ tablet Take 1 tablet (20 mEq total) by mouth 2 (two) times daily for 5 days. 08/15/23 09/07/23  Menshew, Candida LULLA Kings, PA-C  TRELEGY ELLIPTA 200-62.5-25 MCG/ACT AEPB Inhale 1 puff into the lungs daily. 09/11/22   [provider]  traZODone  (DESYREL ) 50 MG tablet Take 1 tablet (50 mg total) by mouth at bedtime. 01/05/20 07/01/20  Valerio Moris T, NP    Physical Exam: Vitals:   10/22/23 2240 10/22/23 2241 10/23/23 0426 10/23/23 0805  BP: 126/67  (!) 114/92 125/69  Pulse: (!) 120  (!) 115 (!) 123  Resp: (!) 24  20 (!) 21  Temp: 98.6 F (37 C)  99 F (37.2 C) 98.3 F (36.8 C)  TempSrc: Oral  Oral Oral  SpO2: 94%  94% 93%  Weight:  78.9 kg    Height:  5' 3 (1.6 m)      Constitutional: NAD, calm, comfortable Vitals:   10/22/23 2240 10/22/23 2241 10/23/23 0426 10/23/23 0805  BP: 126/67  (!) 114/92 125/69  Pulse: (!) 120  (!) 115 (!) 123  Resp: (!) 24  20 (!) 21  Temp: 98.6 F (37 C)  99 F (37.2 C) 98.3 F (36.8 C)  TempSrc: Oral  Oral Oral  SpO2: 94%  94% 93%  Weight:  78.9 kg    Height:  5' 3 (1.6 m)     Eyes: PERRL, lids and conjunctivae normal ENMT: Mucous membranes are moist. Posterior pharynx clear of  any exudate or lesions.Normal dentition.  Neck: normal, supple, no masses, no thyromegaly Respiratory: clear to auscultation bilaterally, scattered wheezing, no crackles. Normal respiratory effort. No accessory muscle use.  Cardiovascular: Regular rate and rhythm, no murmurs / rubs / gallops. No extremity edema. 2+ pedal pulses. No carotid bruits.  Abdomen: no tenderness, no masses palpated. No hepatosplenomegaly. Bowel sounds positive.  Musculoskeletal: no clubbing / cyanosis. No joint deformity upper and lower extremities. Good ROM, no contractures. Normal muscle tone.  Skin: no rashes, lesions, ulcers. No induration Neurologic: CN 2-12 grossly intact. Sensation intact, DTR normal. Strength 5/5 in all 4.  Psychiatric: Normal judgment and insight. Alert and oriented x 3. Normal mood.     Labs on Admission: I have personally reviewed following labs and imaging studies  CBC: Recent Labs  Lab 10/22/23 2246  WBC 21.6*  HGB 13.4  HCT 40.4  MCV 93.7  PLT 336   Basic Metabolic Panel: Recent Labs  Lab 10/22/23 2246  NA 135  K 3.0*  CL 99  CO2 23  GLUCOSE 161*  BUN 7  CREATININE 0.45  CALCIUM  8.3*   GFR: Estimated Creatinine Clearance: 80 mL/min (by C-G formula based on SCr of 0.45 mg/dL). Liver Function Tests: No results for input(s): AST, ALT, ALKPHOS, BILITOT, PROT, ALBUMIN in the last 168 hours. No results for input(s): LIPASE, AMYLASE in the last 168 hours. No results for input(s): AMMONIA in the last 168 hours. Coagulation Profile: No results for input(s): INR, PROTIME in the last 168 hours. Cardiac Enzymes: No results for input(s): CKTOTAL, CKMB, CKMBINDEX, TROPONINI in the last 168 hours. BNP (last 3 results) No results for input(s): PROBNP in the last 8760 hours. HbA1C: No results for input(s): HGBA1C in the last 72 hours. CBG: No results for input(s): GLUCAP in the last 168 hours. Lipid Profile: No results for input(s):  CHOL, HDL, LDLCALC, TRIG, CHOLHDL, LDLDIRECT in the last 72 hours. Thyroid  Function Tests:  No results for input(s): TSH, T4TOTAL, FREET4, T3FREE, THYROIDAB in the last 72 hours. Anemia Panel: No results for input(s): VITAMINB12, FOLATE, FERRITIN, TIBC, IRON, RETICCTPCT in the last 72 hours. Urine analysis:    Component Value Date/Time   COLORURINE YELLOW 03/20/2023 0933   APPEARANCEUR CLEAR 03/20/2023 0933   LABSPEC 1.020 03/20/2023 0933   PHURINE 5.5 03/20/2023 0933   GLUCOSEU NEGATIVE 03/20/2023 0933   HGBUR TRACE (A) 03/20/2023 0933   BILIRUBINUR NEGATIVE 03/20/2023 0933   KETONESUR NEGATIVE 03/20/2023 0933   PROTEINUR NEGATIVE 03/20/2023 0933   NITRITE NEGATIVE 03/20/2023 0933   LEUKOCYTESUR TRACE (A) 03/20/2023 0933    Radiological Exams on Admission: CT Angio Chest PE W and/or Wo Contrast Result Date: 10/23/2023 CLINICAL DATA:  Pulmonary embolism suspected. Shortness of breath with dizziness EXAM: CT ANGIOGRAPHY CHEST WITH CONTRAST TECHNIQUE: Multidetector CT imaging of the chest was performed using the standard protocol during bolus administration of intravenous contrast. Multiplanar CT image reconstructions and MIPs were obtained to evaluate the vascular anatomy. RADIATION DOSE REDUCTION: This exam was performed according to the departmental dose-optimization program which includes automated exposure control, adjustment of the mA and/or kV according to patient size and/or use of iterative reconstruction technique. CONTRAST:  OMNIPAQUE  IOHEXOL  350 MG/ML SOLN COMPARISON:  08/14/2023 FINDINGS: Cardiovascular: Satisfactory opacification of the pulmonary arteries to the segmental level. No evidence of pulmonary embolism. Normal heart size. No pericardial effusion. Mediastinum/Nodes: Chronic retrosternal nodule measuring 2.6 cm, thyroid  density on precontrast imaging from previous year, still most consistent with retrosternal goiter. There is  thickening of hilar lymph nodes not seen previously, considered reactive. Lungs/Pleura: Chronic lung disease with airway thickening and mosaic attenuation of the lungs diffusely. Bands of scarring and atelectasis bilaterally. There are new perihilar ill-defined masslike opacities which are multifocal and bilateral affecting upper and lower lobes. The largest on the right is lateral to the hilum measuring 4 cm on 6:70. On the left a lingular nodule measures 3 cm. No edema, effusion, or pneumothorax. Upper Abdomen: No acute finding Musculoskeletal: No acute finding Review of the MIP images confirms the above findings. IMPRESSION: Nodular and masslike consolidation in the bilateral perihilar lung which is multifocal. Rapid development since 08/14/2023 is most consistent with infectious/inflammatory process, consider atypical pathogens given the pattern. Recommend chest CT follow-up in 3 months. Negative for pulmonary embolism. Electronically Signed   By: Dorn Roulette M.D.   On: 10/23/2023 04:13   DG Chest 2 View Result Date: 10/23/2023 CLINICAL DATA:  Chest pain and shortness of breath. EXAM: CHEST - 2 VIEW COMPARISON:  August 14, 2023 FINDINGS: The heart size and mediastinal contours are within normal limits. Stable, chronic areas of scarring and/or atelectasis are seen within the bilateral lung bases and mid lung fields. Small, ill-defined focal opacities are seen overlying the bilateral perihilar regions. This represents a new finding when compared to the prior study. No pleural effusion or pneumothorax is identified. The visualized skeletal structures are unremarkable. IMPRESSION: 1. Chronic bibasilar and mid lung field scarring and/or atelectasis. 2. Small, ill-defined focal opacities overlying the bilateral perihilar regions which may represent focal infiltrates. Further evaluation with chest CT is recommended to exclude the presence of an underlying neoplastic process. Electronically Signed   By:  Suzen Dials M.D.   On: 10/23/2023 00:28    EKG: Pending  Assessment/Plan Principal Problem:   Sepsis due to pneumonia [facility]) Active Problems:   Pneumonia  (please populate well all problems here in Problem List. (For example, if patient is on  BP meds at home and you resume or decide to hold them, it is a problem that needs to be her. Same for CAD, COPD, HLD and so on)  Sepsis Multifocal pneumonia bilateral, bacterial versus atypical pathogens -Sepsis evidenced by new onset fever, leukocytosis tachycardia elevated lactic acid, without acute endorgan damage, source of infection is bilateral multifocal pneumonia.  Given her clinical presentation and CT chest findings, most likely compatible with atypical pneumonia. -Discussed with pharmacy as patient is allergic to azithromycin and penicillin, decided to continue coverage with Levaquin  -Clinically patient is euvolemic, hold off further IVF -Other DDx, considered rheumatoid lung, repeat CT chest in 3 months as per recommendation from radiology  Acute COPD exacerbation -Secondary to bilateral pneumonia -P.o. steroid -LABA, ICS -DuoNebs and as needed albuterol  -Incentive spirometry  Acute pleural chest pain -PE ruled out -Etiology likely pleural chest pain secondary to worsening of cough. PRN cough meds -Oxycodone  and Toradol  PRN  Rheumatoid arthritis -No acute concern, outpatient rheumatology follow-up for DMARDs   HTN -Blood pressure borderline low, plan to resume BP meds stepwise -Today, will restart amlodipine  with half dose -Hydralazine  as needed  DVT prophylaxis: Lovenox  Code Status: Full code Family Communication: None at bedside Disposition Plan: Patient is sick with bilateral pneumonia, requiring IV antibiotics, expect more than 2 midnight hospital stay Consults called: Text on-call pulmonary Dr. Coreen Admission status: Telemetry admission   Cort ONEIDA Mana MD [facility] Pager [phone] 10/23/2023, 9:40  AM

## 2023-10-23 NOTE — ED Notes (Signed)
Pt IV redressed, pt linens changed. Pt went to the bathroom, back into bed.

## 2023-10-23 NOTE — ED Notes (Addendum)
 Pt c/o to this RN that she has been calling out since 3am for cough medicine, pain medicine for her back, something to drink, and to be on the monitor. Pt c/o that both side rails were not up despite her being a fall risk (only one side rail was up). Previous RN stated that pt did not hit call light on her shift.  Pt placed on O2 monitor and informed there is no order for cardiac monitoring at this time. MD in room now. MD made aware of pt's pain and desire for cough medicine.

## 2023-10-23 NOTE — ED Provider Notes (Signed)
 [facility] Provider Note    Event Date/Time   First MD Initiated Contact with Patient 10/23/23 [phone]     (approximate)  History   Chief Complaint: Weakness and Shortness of Breath  HPI  Catherine Giles is a 52 y.o. female with a past medical history of asthma, COPD, diabetes, fibromyalgia, hypertension, lupus, presents to the emergency department for 3 to 4 weeks of worsening cough now with shortness of breath and fever to 103 today.  According to the patient for the past 3 weeks or so she has had a fairly frequent cough, she states over the last couple days it has worsened significantly and now she is having a fever to 103 today she states at home.  On arrival to the hospital patient is afebrile 98.6 but she is tachycardic at 120 bpm mildly tachypneic with a room air saturation around 90%.  Patient placed on 2 L nasal cannula.  No baseline O2 requirement.  Physical Exam   Triage Vital Signs: ED Triage Vitals  Encounter Vitals Group     BP 10/22/23 2240 126/67     Systolic BP Percentile --      Diastolic BP Percentile --      Pulse Rate 10/22/23 2240 (!) 120     Resp 10/22/23 2240 (!) 24     Temp 10/22/23 2240 98.6 F (37 C)     Temp Source 10/22/23 2240 Oral     SpO2 10/22/23 2228 95 %     Weight 10/22/23 2241 174 lb (78.9 kg)     Height 10/22/23 2241 5' 3 (1.6 m)     Head Circumference --      Peak Flow --      Pain Score 10/22/23 2241 8     Pain Loc --      Pain Education --      Exclude from Growth Chart --     Most recent vital signs: Vitals:   10/22/23 2228 10/22/23 2240  BP:  126/67  Pulse:  (!) 120  Resp:  (!) 24  Temp:  98.6 F (37 C)  SpO2: 95% 94%    General: Awake, no distress.  CV:  Good peripheral perfusion.  Regular rate and rhythm Resp:  Normal effort.  Equal breath sounds bilaterally.  No wheeze rales or rhonchi.  Patient does state mild chest wall tenderness. Abd:  No distention.  Soft, nontender.  No rebound or  guarding.  ED Results / Procedures / Treatments   EKG  EKG viewed and interpreted by myself shows sinus tachycardia at 120 bpm with a narrow QRS, normal axis, slight QTc prolongation otherwise normal intervals, nonspecific ST changes.  RADIOLOGY  I have reviewed and interpreted chest x-ray images.  Patient appears to have opacities in the right midlung as well as left upper lobe. Radiology is reading the x-ray as chronic bibasilar scarring with ill-defined focal opacities bilaterally.   MEDICATIONS ORDERED IN ED: Medications  levofloxacin  (LEVAQUIN ) IVPB 750 mg (has no administration in time range)  iohexol  (OMNIPAQUE ) 350 MG/ML injection 100 mL (has no administration in time range)     IMPRESSION / MDM / ASSESSMENT AND PLAN / ED COURSE  I reviewed the triage vital signs and the nursing notes.  Patient's presentation is most consistent with acute presentation with potential threat to life or bodily function.  Patient presents to the emergency department for worsening cough intermittent chest pain and fever to 103 at home.  Patient tachycardic and mildly  tachypneic in the emergency department with a room air saturation around 90%.  Placed on 2 L nasal cannula.  Patient is chest x-ray shows bilateral patchy opacities possibly pneumonia versus neoplastic process we will obtain a CTA of the chest to further evaluate and rule out any pulmonary embolism.  Patient's lab work shows significant leukocytosis of 21,000 otherwise reassuring CBC, chemistry overall reassuring with just slight hypokalemia.  Patient's COVID/flu/RSV test is negative.  We will start the patient on antibiotics given the patient's reported fever of 103 at home earlier today tachycardia and tachypnea meeting sepsis criteria.  We will obtain a CTA of the chest to further evaluate.  CTA shows masslike consolidation but thought most likely to be infectious in nature.  Patient receiving IV antibiotics will admit to the hospital  service for further workup and treatment.   CRITICAL CARE Performed by: Franky Moores   Total critical care time: 30 minutes  Critical care time was exclusive of separately billable procedures and treating other patients.  Critical care was necessary to treat or prevent imminent or life-threatening deterioration.  Critical care was time spent personally by me on the following activities: development of treatment plan with patient and/or surrogate as well as nursing, discussions with consultants, evaluation of patient's response to treatment, examination of patient, obtaining history from patient or surrogate, ordering and performing treatments and interventions, ordering and review of laboratory studies, ordering and review of radiographic studies, pulse oximetry and re-evaluation of patient's condition.   FINAL CLINICAL IMPRESSION(S) / ED DIAGNOSES   Sepsis Multifocal pneumonia   Note:  This document was prepared using Dragon voice recognition software and may include unintentional dictation errors.   Moores Franky, MD 10/23/23 [phone]

## 2023-10-23 NOTE — Progress Notes (Signed)
 CODE SEPSIS - PHARMACY COMMUNICATION  **Broad Spectrum Antibiotics should be administered within 1 hour of Sepsis diagnosis**  Time Code Sepsis Called/Page Received: 12/31 @ 0112   Antibiotics Ordered: levaquin    Time of 1st antibiotic administration: Levaquin  750 mg IV X 1  Additional action taken by pharmacy: 12/31 @ 0122   If necessary, Name of Provider/Nurse Contacted:     Brooklen Runquist D ,PharmD Clinical Pharmacist  10/23/2023  2:21 AM

## 2023-10-23 NOTE — ED Notes (Signed)
Hospitalist messaged due to pt remaining tachycardic. Per hospitalist obtain EKG and give dose of lopressor.

## 2023-10-23 NOTE — Sepsis Progress Note (Signed)
 Elink monitoring for the code sepsis protocol.

## 2023-10-23 NOTE — Consult Note (Signed)
 PULMONOLOGY         Date: 10/23/2023,   MRN# [id number] Catherine Giles 02-Oct-1971     AdmissionWeight: 78.9 kg                 CurrentWeight: 78.9 kg  Referring provider: Dr Laurita   CHIEF COMPLAINT:   Abnormal CT chest  HISTORY OF PRESENT ILLNESS   This is a 52 yo with history of aRA, COPD lifelong smoking since age 13 smoking 1ppd, Asthma/COPD, SLE, hypothyroidism.  She reports feeling severe fatigue and overal malaise over past 3 months.  She reports subjective fevers.  She had CTPE done with no PE noted but did have mass like consolidations.  PCCM consultation was placed for further evaluation.   PAST MEDICAL HISTORY   Past Medical History:  Diagnosis Date  . Arthritis    RA  . Asthma   . COPD (chronic obstructive pulmonary disease) (HCC)   . Depression   . Diabetes mellitus without complication (HCC)   . Emphysema of lung (HCC)   . Family history of breast cancer    6/21 cancer genetic testing letter sent  . Fibromyalgia   . Hypertension   . Lupus   . Pre-diabetes   . Thyroid  disease      SURGICAL HISTORY   Past Surgical History:  Procedure Laterality Date  . CHOLECYSTECTOMY, LAPAROSCOPIC  02/2022  . MOUTH SURGERY    . TRANSTHORACIC ECHOCARDIOGRAM  04/2020   EF 60 to 65%.  Normal LV function.  No R WMA.  GR 1 DD.  Normal RV size and function.  Normal PAP.  Normal atrial size.  Normal valves.     FAMILY HISTORY   Family History  Problem Relation Age of Onset  . Lung cancer Mother   . Diabetes Mother   . Breast cancer Mother 48  . Hypertension Father   . Cancer Maternal Aunt   . Ovarian cancer Maternal Aunt   . Diabetes Maternal Aunt   . Diabetes Maternal Uncle   . Hypertension Maternal Grandmother   . Diabetes Maternal Grandmother      SOCIAL HISTORY   Social History   Tobacco Use  . Smoking status: Every Day    Current packs/day: 1.00    Average packs/day: 1 pack/day for 34.0 years (34.0 ttl pk-yrs)    Types:  Cigarettes  . Smokeless tobacco: Never  . Tobacco comments:    2-3 cig/day  Vaping Use  . Vaping status: Never Used  Substance Use Topics  . Alcohol use: Yes    Comment: Occ  . Drug use: Not Currently     MEDICATIONS    Home Medication:  Current Outpatient Rx  . Order #: [id number] Class: Historical Med  . Order #: [id number] Class: Historical Med  . Order #: [id number] Class: Normal  . Order #: [id number] Class: Normal  . Order #: [id number] Class: Normal  . Order #: [id number] Class: Normal  . Order #: [id number] Class: Historical Med  . Order #: [id number] Class: Normal  . Order #: [id number] Class: Normal  . Order #: [id number] Class: Normal  . Order #: [id number] Class: Historical Med  . Order #: [id number] Class: Historical Med  . Order #: [id number] Class: Normal  . Order #: [id number] Class: Normal  . Order #: [id number] Class: Normal  . Order #: [id number] Class: Normal  . Order #: [id number] Class: Normal  . Order #: [id number] Class: Normal  . Order #: [id number] Class: Normal  . Order #: [id number] Class: Historical Med  . Order #: [id number] Class: Historical Med  . Order #:  [id number] Class: Historical Med  . Order #: [id number] Class: Historical Med  . Order #: [id number] Class: Historical Med  . Order #: [id number] Class: Normal  . Order #: [id number] Class: Historical Med    Current Medication:  Current Facility-Administered Medications:  .  acetaminophen  (TYLENOL ) tablet 650 mg, 650 mg, Oral, Q6H PRN **OR** acetaminophen  (TYLENOL ) suppository 650 mg, 650 mg, Rectal, Q6H PRN, Laurita Manor T, MD .  albuterol  (VENTOLIN  HFA) 108 (90 Base) MCG/ACT inhaler 1 puff, 1 puff, Inhalation, Q6H PRN, Laurita Manor T, MD .  amLODipine  (NORVASC ) tablet 5 mg, 5 mg, Oral, Daily, Zhang, Ping T, MD .  benzonatate  (TESSALON ) capsule 100 mg, 100 mg, Oral, TID PRN, Zhang, Ping T, MD .  cyclobenzaprine  (FLEXERIL ) tablet 5 mg, 5 mg, Oral, QHS, Zhang, Manor T, MD .  fluticasone  furoate-vilanterol (BREO ELLIPTA ) 200-25 MCG/ACT  1 puff, 1 puff, Inhalation, Daily **AND** umeclidinium bromide  (INCRUSE ELLIPTA ) 62.5 MCG/ACT 1 puff, 1 puff, Inhalation, Daily, Zhang, Manor T, MD .  fondaparinux  (ARIXTRA ) injection 2.5 mg, 2.5 mg, Subcutaneous, Q24H, Zhang, Ping T, MD .  hydrALAZINE  (APRESOLINE ) injection 5 mg, 5 mg, Intravenous, Q6H PRN, Laurita Manor T, MD .  hydrOXYzine  (ATARAX ) tablet 25 mg, 25 mg, Oral, TID PRN, Zhang, Ping T, MD .  ipratropium (ATROVENT ) 0.06 % nasal spray 2 spray, 2 spray, Each Nare, QID, Laurita Manor T, MD .  ketorolac  (TORADOL ) 15 MG/ML injection 15 mg, 15 mg, Intravenous, Q6H PRN, Zhang, Ping T, MD .  NOREEN ON 10/24/2023] levofloxacin  (LEVAQUIN ) IVPB 750 mg, 750 mg, Intravenous, Q24H, Zhang, Ping T, MD .  levothyroxine  (SYNTHROID ) tablet 50 mcg, 50 mcg, Oral, QAC breakfast, Laurita Manor T, MD .  loratadine  (CLARITIN ) tablet 10 mg, 10 mg, Oral, Daily, Zhang, Ping T, MD .  montelukast  (SINGULAIR ) tablet 10 mg, 10 mg, Oral, Daily, Zhang, Ping T, MD .  ondansetron  (ZOFRAN ) tablet 4 mg, 4 mg, Oral, Q6H PRN **OR** ondansetron  (ZOFRAN ) injection 4 mg, 4 mg, Intravenous, Q6H PRN, Laurita Manor T, MD .  oxyCODONE  (Oxy IR/ROXICODONE ) immediate release tablet 5 mg, 5 mg, Oral, Q4H PRN, Laurita Manor T, MD .  potassium chloride  SA (KLOR-CON  M) CR tablet 40 mEq, 40 mEq, Oral, Q2H, Zhang, Ping T, MD .  predniSONE  (DELTASONE ) tablet 40 mg, 40 mg, Oral, Q breakfast, Laurita Manor DASEN, MD  Current Outpatient Medications:  .  Acidophilus Lactobacillus CAPS, Take 1 tablet by mouth daily., Disp: , Rfl:  .  albuterol  (VENTOLIN  HFA) 108 (90 Base) MCG/ACT inhaler, , Disp: , Rfl:  .  amLODipine  (NORVASC ) 10 MG tablet, Take 1 tablet (10 mg total) by mouth daily., Disp: 90 tablet, Rfl: 4 .  Blood Glucose Monitoring Suppl (ONETOUCH VERIO) w/Device KIT, Use to check blood sugar 3 times daily with goal <130 fasting and <180 two hours after meal.  Bring meter to visits., Disp: 1 kit, Rfl: 0 .  cetirizine  (ZYRTEC ) 10 MG tablet, Take 1 tablet  (10 mg total) by mouth daily., Disp: 90 tablet, Rfl: 11 .  cloNIDine  (CATAPRES ) 0.1 MG tablet, TAKE 1 TABLET BY MOUTH TWICE DAILY, Disp: 60 tablet, Rfl: 0 .  cyclobenzaprine  (FLEXERIL ) 5 MG tablet, Take 1 tablet every day by oral route at bedtime., Disp: , Rfl:  .  dapagliflozin  propanediol (FARXIGA ) 10 MG TABS tablet, Take 1 tablet (10 mg total) by mouth daily before breakfast., Disp: 90 tablet, Rfl: 4 .  EPINEPHrine  0.3 mg/0.3 mL IJ SOAJ injection, Inject 0.3 mg into the muscle as needed for anaphylaxis., Disp: 1 each, Rfl: 4 .  glucose blood test strip, Use to check blood sugar 3 times daily with goal <130 fasting and <180 two hours after meal., Disp: 100 each, Rfl: 12 .  golimumab  (SIMPONI  ARIA) 50 MG/4ML SOLN injection, Inject into the vein., Disp: , Rfl:  .  hydrochlorothiazide  (HYDRODIURIL ) 25 MG tablet, Take 25 mg by mouth daily., Disp: , Rfl:  .  HYDROcodone  bit-homatropine (HYCODAN) 5-1.5 MG/5ML syrup, Take 5 mLs by mouth every 6 (six) hours as needed for cough., Disp: 120 mL, Rfl: 0 .  hydrOXYzine  (VISTARIL ) 25 MG capsule, TAKE 1 CAPSULE BY MOUTH EVERY 8 HOURS ASNEEDED, Disp: 60 capsule, Rfl: 0 .  ibuprofen  (ADVIL ) 600 MG tablet, Take 1 tablet (600 mg total) by mouth every 8 (eight) hours as needed., Disp: 30 tablet, Rfl: 0 .  ipratropium (ATROVENT ) 0.06 % nasal spray, Place 2 sprays into both nostrils 4 (four) times daily., Disp: 15 mL, Rfl: 12 .  ipratropium-albuterol  (DUONEB) 0.5-2.5 (3) MG/3ML SOLN, Take 3 mLs by nebulization every 6 (six) hours as needed for up to 7 days., Disp: 360 mL, Rfl: 0 .  Lancets (ONETOUCH ULTRASOFT) lancets, Use to check blood sugar 3 times daily with goal <130 fasting and <180 two hours after meal., Disp: 100 each, Rfl: 12 .  Lancets (ONETOUCH ULTRASOFT) lancets, Use as directed, Disp: 60 each, Rfl: 5 .  leflunomide  (ARAVA ) 20 MG tablet, Take 20 mg by mouth daily. (Patient not taking: Reported on 09/07/2023), Disp: , Rfl:  .  levothyroxine  (SYNTHROID ) 50 MCG  tablet, Take 50 mcg by mouth daily before breakfast. , Disp: , Rfl:  .  losartan  (COZAAR ) 100 MG tablet, Take 100 mg by mouth daily., Disp: , Rfl:  .  metFORMIN  (GLUCOPHAGE -XR) 500 MG 24 hr tablet, , Disp: , Rfl:  .  montelukast  (SINGULAIR ) 10 MG tablet, Take 10 mg by mouth daily., Disp: , Rfl:  .  potassium chloride  SA (KLOR-CON  M) 20 MEQ tablet, Take 1 tablet (20 mEq total) by mouth 2 (two) times daily for 5 days., Disp: 10 tablet, Rfl: 0 .  TRELEGY ELLIPTA 200-62.5-25 MCG/ACT AEPB, Inhale 1 puff into the lungs daily., Disp: , Rfl:     ALLERGIES   Azithromycin, Molds & smuts, Alpha-gal, Penicillins, and Shellfish allergy     REVIEW OF SYSTEMS    Review of Systems:  Gen:  Denies  fever, sweats, chills weigh loss  HEENT: Denies blurred vision, double vision, ear pain, eye pain, hearing loss, nose bleeds, sore throat Cardiac:  No dizziness, chest pain or heaviness, chest tightness,edema Resp:   reports dyspnea chronically  Gi: Denies swallowing difficulty, stomach pain, nausea or vomiting, diarrhea, constipation, bowel incontinence Gu:  Denies bladder incontinence, burning urine Ext:   Denies Joint pain, stiffness or swelling Skin: Denies  skin rash, easy bruising or bleeding or hives Endoc:  Denies polyuria, polydipsia , polyphagia or weight change Psych:   Denies depression, insomnia or hallucinations   Other:  All other systems negative   VS: BP 125/69   Pulse (!) 123   Temp 98.3 F (36.8 C) (Oral)   Resp (!) 21   Ht 5' 3 (1.6 m)   Wt 78.9 kg   SpO2 93%   BMI 30.82 kg/m      PHYSICAL EXAM    GENERAL:NAD, no fevers, chills, no weakness no fatigue HEAD: Normocephalic, atraumatic.  EYES: Pupils equal, round, reactive to light. Extraocular muscles intact. No scleral icterus.  MOUTH: Moist mucosal membrane. Dentition intact. No abscess noted.  EAR, NOSE, THROAT:  Clear without exudates. No external lesions.  NECK: Supple. No thyromegaly. No nodules. No JVD.   PULMONARY: decreased breath sounds with mild rhonchi worse at bases bilaterally.  CARDIOVASCULAR: S1 and S2. Regular rate and rhythm. No murmurs, rubs, or gallops. No edema. Pedal pulses 2+ bilaterally.  GASTROINTESTINAL: Soft, nontender, nondistended. No masses. Positive bowel sounds. No hepatosplenomegaly.  MUSCULOSKELETAL: No swelling, clubbing, or edema. Range of motion full in all extremities.  NEUROLOGIC: Cranial nerves II through XII are intact. No gross focal neurological deficits. Sensation intact. Reflexes intact.  SKIN: No ulceration, lesions, rashes, or cyanosis. Skin warm and dry. Turgor intact.  PSYCHIATRIC: Mood, affect within normal limits. The patient is awake, alert and oriented x 3. Insight, judgment intact.       IMAGING     ASSESSMENT/PLAN   Multiple pulmonary nodular opacities    Patient has history of RA and may have RA related lung nodules , there is also higher susceptibility to opportunistic infections. She takes Breo ellipta  Patient also has significant smoking history with overall pre-test probability of lung cancer at moderate risk -will order serology for beta d glucan , cryptococcal antigen, aspergillus -patient is currently on antibiotics with levofloxacin . -patient empirically started on prednisone  for COPD with exacerbation and is on nebulizer therapy -respiratory cultures today -COVID/FLU/RSV negative -will continue to follow        Thank you for allowing me to participate in the care of this patient.   Patient/Family are satisfied with care plan and all questions have been answered.    Provider disclosure: Patient with at least one acute or chronic illness or injury that poses a threat to life or bodily function and is being managed actively during this encounter.  All of the below services have been performed independently by signing provider:  review of prior documentation from internal and or external health records.  Review of previous  and current lab results.  Interview and comprehensive assessment during patient visit today. Review of current and previous chest radiographs/CT scans. Discussion of management and test interpretation with health care team and patient/family.   This document was prepared using Dragon voice recognition software and may include unintentional dictation errors.     Haidynn Almendarez, M.D.  Division of Pulmonary & Critical Care Medicine

## 2023-10-23 NOTE — Sepsis Progress Note (Signed)
Verified with bedside RN via secure chat that antibiotics were given prior to blood cx & initial lactic being drawn.

## 2023-10-23 NOTE — ED Notes (Signed)
Report given to [facility] nurse

## 2023-10-24 DIAGNOSIS — J189 Pneumonia, unspecified organism: Secondary | ICD-10-CM | POA: Diagnosis not present

## 2023-10-24 DIAGNOSIS — A419 Sepsis, unspecified organism: Secondary | ICD-10-CM | POA: Diagnosis not present

## 2023-10-24 LAB — CBC
HCT: 38 % (ref 36.0–46.0)
Hemoglobin: 12.5 g/dL (ref 12.0–15.0)
MCH: 30.3 pg (ref 26.0–34.0)
MCHC: 32.9 g/dL (ref 30.0–36.0)
MCV: 92.2 fL (ref 80.0–100.0)
Platelets: 310 10*3/uL (ref 150–400)
RBC: 4.12 MIL/uL (ref 3.87–5.11)
RDW: 14.2 % (ref 11.5–15.5)
WBC: 20.8 10*3/uL — ABNORMAL HIGH (ref 4.0–10.5)
nRBC: 0 % (ref 0.0–0.2)

## 2023-10-24 LAB — EXPECTORATED SPUTUM ASSESSMENT W GRAM STAIN, RFLX TO RESP C

## 2023-10-24 LAB — GLUCOSE, CAPILLARY
Glucose-Capillary: 215 mg/dL — ABNORMAL HIGH (ref 70–99)
Glucose-Capillary: 392 mg/dL — ABNORMAL HIGH (ref 70–99)

## 2023-10-24 LAB — BASIC METABOLIC PANEL
Anion gap: 10 (ref 5–15)
BUN: 18 mg/dL (ref 6–20)
CO2: 25 mmol/L (ref 22–32)
Calcium: 9.3 mg/dL (ref 8.9–10.3)
Chloride: 104 mmol/L (ref 98–111)
Creatinine, Ser: 0.62 mg/dL (ref 0.44–1.00)
GFR, Estimated: >60 mL/min (ref >60)
Glucose, Bld: 262 mg/dL — ABNORMAL HIGH (ref 70–99)
Potassium: 3.3 mmol/L — ABNORMAL LOW (ref 3.5–5.1)
Sodium: 139 mmol/L (ref 135–145)

## 2023-10-24 LAB — LEGIONELLA PNEUMOPHILA SEROGP 1 UR AG: L. pneumophila Serogp 1 Ur Ag: NEGATIVE

## 2023-10-24 LAB — LACTIC ACID, PLASMA: Lactic Acid, Venous: 2.3 mmol/L (ref 0.5–1.9)

## 2023-10-24 MED ORDER — DAPAGLIFLOZIN PROPANEDIOL 10 MG PO TABS
10.0000 mg | ORAL_TABLET | Freq: Every day | ORAL | Status: DC
  Administered 2023-10-25 – 2023-10-26 (×2): 10 mg via ORAL
  Filled 2023-10-24 (×2): qty 1

## 2023-10-24 MED ORDER — DOCUSATE SODIUM 100 MG PO CAPS
100.0000 mg | ORAL_CAPSULE | Freq: Two times a day (BID) | ORAL | Status: DC
  Administered 2023-10-24 – 2023-10-26 (×5): 100 mg via ORAL
  Filled 2023-10-24 (×5): qty 1

## 2023-10-24 MED ORDER — MORPHINE SULFATE (PF) 4 MG/ML IV SOLN: 4.0000 mg | INTRAVENOUS | Status: AC | PRN

## 2023-10-24 MED ORDER — POTASSIUM CHLORIDE CRYS ER 20 MEQ PO TBCR
40.0000 meq | EXTENDED_RELEASE_TABLET | Freq: Once | ORAL | Status: AC
  Administered 2023-10-24: 40 meq via ORAL
  Filled 2023-10-24: qty 2

## 2023-10-24 MED ORDER — DULOXETINE HCL 30 MG PO CPEP
60.0000 mg | ORAL_CAPSULE | Freq: Every day | ORAL | Status: DC
  Administered 2023-10-24 – 2023-10-26 (×3): 60 mg via ORAL
  Filled 2023-10-24 (×3): qty 2

## 2023-10-24 MED ORDER — NICOTINE 14 MG/24HR TD PT24
14.0000 mg | MEDICATED_PATCH | Freq: Every day | TRANSDERMAL | Status: DC
  Administered 2023-10-24 – 2023-10-26 (×3): 14 mg via TRANSDERMAL
  Filled 2023-10-24 (×3): qty 1

## 2023-10-24 MED ORDER — INSULIN ASPART 100 UNIT/ML IJ SOLN
15.0000 [IU] | Freq: Once | INTRAMUSCULAR | Status: AC
  Administered 2023-10-24: 15 [IU] via SUBCUTANEOUS
  Filled 2023-10-24: qty 1

## 2023-10-24 MED ORDER — INSULIN ASPART 100 UNIT/ML IJ SOLN
0.0000 [IU] | Freq: Three times a day (TID) | INTRAMUSCULAR | Status: DC
  Administered 2023-10-25: 3 [IU] via SUBCUTANEOUS
  Administered 2023-10-26: 2 [IU] via SUBCUTANEOUS
  Filled 2023-10-24 (×2): qty 1

## 2023-10-24 NOTE — Progress Notes (Addendum)
 Progress Note    Adelina Collard  FMW:[id number] DOB: September 26, 1971  DOA: 10/23/2023 PCP: [facility]      Brief Narrative:    Medical records reviewed and are as summarized below:  Catherine Giles is a 53 y.o. female  with medical history significant for RA on DMARDs, asthma/COPD Gold stage II, HTN, lupus, hypothyroidism, presented with worsening of cough, chest pain, fever.   She was admitted to the hospital for sepsis secondary to multifocal pneumonia and COPD exacerbation.   Assessment/Plan:   Principal Problem:   Sepsis due to pneumonia [facility]) Active Problems:   Pneumonia    Body mass index is 30.82 kg/m.  (Obesity)   Severe sepsis secondary to multifocal pneumonia: Continue IV Levaquin .  Follow-up blood cultures.  Fungitell, cryptococcus, Aspergillus, mycoplasma and Legionella test are pending. Lactic acid was 2.4 on admission.   COPD exacerbation: Continue steroids and bronchodilators Chronic hypoxic respiratory failure: Continue 2 L/min oxygen via nasal cannula   Severe pleuritic chest pain: This is likely due to pneumonia.  Add IV morphine  for adequate pain control.  Continue Tylenol , oxycodone  and IV Toradol  as needed. CTA negative for acute pulmonary embolism.   Bilateral lung opacities: Likely osteoarthritis lateral to the hilum measuring 4 cm and left lingular nodule measuring 3 cm: Outpatient follow-up with CT chest recommended in 3 months.   Hypokalemia: Replete potassium and monitor levels   Type II DM with hyperglycemia: Start NovoLog  as needed for hyperglycemia. Resume Farxiga    Comorbidities include hypertension, rheumatoid arthritis, hypothyroidism, tobacco use disorder   Diet Order             Diet heart healthy/carb modified Room service appropriate? Yes; Fluid consistency: Thin  Diet effective now                             Consultants: Pulmonologist  Procedures: None    Medications:    amLODipine   5 mg Oral Daily   cyclobenzaprine   5 mg Oral QHS   [START ON 10/25/2023] dapagliflozin  propanediol  10 mg Oral QAC breakfast   DULoxetine   60 mg Oral Daily   fluticasone  furoate-vilanterol  1 puff Inhalation Daily   And   umeclidinium bromide   1 puff Inhalation Daily   fondaparinux  (ARIXTRA ) injection  2.5 mg Subcutaneous Q24H   insulin  aspart  0-15 Units Subcutaneous TID WC   ipratropium  2 spray Each Nare QID   levothyroxine   50 mcg Oral Q0600   loratadine   10 mg Oral Daily   metoprolol  tartrate  5 mg Intravenous Once   montelukast   10 mg Oral Daily   predniSONE   40 mg Oral Q breakfast   Continuous Infusions:  levofloxacin  (LEVAQUIN ) IV 750 mg (10/24/23 0555)     Anti-infectives (From admission, onward)    Start     Dose/Rate Route Frequency Ordered Stop   10/24/23 0600  levofloxacin  (LEVAQUIN ) IVPB 750 mg        750 mg 100 mL/hr over 90 Minutes Intravenous Every 24 hours 10/23/23 0905     10/23/23 0900  cefTRIAXone (ROCEPHIN) 2 g in sodium chloride  0.9 % 100 mL IVPB  Status:  Discontinued        2 g 200 mL/hr over 30 Minutes Intravenous Every 24 hours 10/23/23 0847 10/23/23 0905   10/23/23 0900  azithromycin (ZITHROMAX) 500 mg in sodium chloride  0.9 % 250 mL IVPB  Status:  Discontinued  500 mg 250 mL/hr over 60 Minutes Intravenous Every 24 hours 10/23/23 0847 10/23/23 0905   10/23/23 0115  levofloxacin  (LEVAQUIN ) IVPB 750 mg        750 mg 100 mL/hr over 90 Minutes Intravenous  Once 10/23/23 0112 10/23/23 0207              Family Communication/Anticipated D/C date and plan/Code Status   DVT prophylaxis: fondaparinux  (ARIXTRA ) injection 2.5 mg Start: 10/23/23 2200     Code Status: Full Code  Family Communication: None Disposition Plan: Plan to discharge home   Status is: Inpatient Remains inpatient appropriate because:  Pneumonia       Subjective:   Interval events noted.  She complains of cough and severe pleuritic chest pain.SABRA Go, RN, was at the bedside  Objective:    Vitals:   10/23/23 2233 10/24/23 0121 10/24/23 0203 10/24/23 0738  BP:  130/71 123/74 129/77  Pulse: 93 86 86 97  Resp: 20 20 20 19   Temp:  97.6 F (36.4 C) 97.7 F (36.5 C) 98.5 F (36.9 C)  TempSrc:  Oral  Oral  SpO2: 90% 96% 99% 90%  Weight:      Height:       No data found.  No intake or output data in the 24 hours ending 10/24/23 1305 Filed Weights   10/22/23 2241  Weight: 78.9 kg    Exam:  GEN: NAD SKIN: Warm and dry EYES: No pallor or icterus ENT: MMM CV: RRR PULM: Decreased air entry bilaterally, bilateral expiratory wheezing ABD: soft, obese, NT, +BS CNS: AAO x 3, non focal EXT: No edema or tenderness        Data Reviewed:   I have personally reviewed following labs and imaging studies:  Labs: Labs show the following:   Basic Metabolic Panel: Recent Labs  Lab 10/22/23 2246 10/23/23 0208 10/24/23 0342  NA 135  --  139  K 3.0*  --  3.3*  CL 99  --  104  CO2 23  --  25  GLUCOSE 161*  --  262*  BUN 7  --  18  CREATININE 0.45  --  0.62  CALCIUM  8.3*  --  9.3  MG  --  2.0  --    GFR Estimated Creatinine Clearance: 80 mL/min (by C-G formula based on SCr of 0.62 mg/dL). Liver Function Tests: No results for input(s): AST, ALT, ALKPHOS, BILITOT, PROT, ALBUMIN in the last 168 hours. No results for input(s): LIPASE, AMYLASE in the last 168 hours. No results for input(s): AMMONIA in the last 168 hours. Coagulation profile No results for input(s): INR, PROTIME in the last 168 hours.  CBC: Recent Labs  Lab 10/22/23 2246 10/24/23 0342  WBC 21.6* 20.8*  HGB 13.4 12.5  HCT 40.4 38.0  MCV 93.7 92.2  PLT 336 310   Cardiac Enzymes: No results for input(s): CKTOTAL, CKMB, CKMBINDEX, TROPONINI in the last 168 hours. BNP (last 3 results) No  results for input(s): PROBNP in the last 8760 hours. CBG: Recent Labs  Lab 10/24/23 0809  GLUCAP 215*   D-Dimer: No results for input(s): DDIMER in the last 72 hours. Hgb A1c: No results for input(s): HGBA1C in the last 72 hours. Lipid Profile: No results for input(s): CHOL, HDL, LDLCALC, TRIG, CHOLHDL, LDLDIRECT in the last 72 hours. Thyroid  function studies: No results for input(s): TSH, T4TOTAL, T3FREE, THYROIDAB in the last 72 hours.  Invalid input(s): FREET3 Anemia work up: No results for input(s): VITAMINB12, FOLATE, FERRITIN,  TIBC, IRON, RETICCTPCT in the last 72 hours. Sepsis Labs: Recent Labs  Lab 10/22/23 2246 10/23/23 0208 10/23/23 0400 10/23/23 1148 10/24/23 0342  WBC 21.6*  --   --   --  20.8*  LATICACIDVEN  --  2.4* 2.2* 2.3* 2.3*    Microbiology Recent Results (from the past 240 hours)  Resp panel by RT-PCR (RSV, Flu A&B, Covid) Anterior Nasal Swab     Status: None   Collection Time: 10/22/23 10:46 PM   Specimen: Anterior Nasal Swab  Result Value Ref Range Status   SARS Coronavirus 2 by RT PCR NEGATIVE NEGATIVE Final    Comment: (NOTE) SARS-CoV-2 target nucleic acids are NOT DETECTED.  The SARS-CoV-2 RNA is generally detectable in upper respiratory specimens during the acute phase of infection. The lowest concentration of SARS-CoV-2 viral copies this assay can detect is 138 copies/mL. A negative result does not preclude SARS-Cov-2 infection and should not be used as the sole basis for treatment or other patient management decisions. A negative result may occur with  improper specimen collection/handling, submission of specimen other than nasopharyngeal swab, presence of viral mutation(s) within the areas targeted by this assay, and inadequate number of viral copies(<138 copies/mL). A negative result must be combined with clinical observations, patient history, and epidemiological information. The expected  result is Negative.  Fact Sheet for Patients:  bloggercourse.com  Fact Sheet for Healthcare Providers:  seriousbroker.it  This test is no t yet approved or cleared by the [place]  FDA and  has been authorized for detection and/or diagnosis of SARS-CoV-2 by FDA under an Emergency Use Authorization (EUA). This EUA will remain  in effect (meaning this test can be used) for the duration of the COVID-19 declaration under Section 564(b)(1) of the Act, 21 U.S.C.section 360bbb-3(b)(1), unless the authorization is terminated  or revoked sooner.       Influenza A by PCR NEGATIVE NEGATIVE Final   Influenza B by PCR NEGATIVE NEGATIVE Final    Comment: (NOTE) The Xpert Xpress SARS-CoV-2/FLU/RSV plus assay is intended as an aid in the diagnosis of influenza from Nasopharyngeal swab specimens and should not be used as a sole basis for treatment. Nasal washings and aspirates are unacceptable for Xpert Xpress SARS-CoV-2/FLU/RSV testing.  Fact Sheet for Patients: bloggercourse.com  Fact Sheet for Healthcare Providers: seriousbroker.it  This test is not yet approved or cleared by the [place]  FDA and has been authorized for detection and/or diagnosis of SARS-CoV-2 by FDA under an Emergency Use Authorization (EUA). This EUA will remain in effect (meaning this test can be used) for the duration of the COVID-19 declaration under Section 564(b)(1) of the Act, 21 U.S.C. section 360bbb-3(b)(1), unless the authorization is terminated or revoked.     Resp Syncytial Virus by PCR NEGATIVE NEGATIVE Final    Comment: (NOTE) Fact Sheet for Patients: bloggercourse.com  Fact Sheet for Healthcare Providers: seriousbroker.it  This test is not yet approved or cleared by the [place]  FDA and has been authorized for detection and/or diagnosis of  SARS-CoV-2 by FDA under an Emergency Use Authorization (EUA). This EUA will remain in effect (meaning this test can be used) for the duration of the COVID-19 declaration under Section 564(b)(1) of the Act, 21 U.S.C. section 360bbb-3(b)(1), unless the authorization is terminated or revoked.  Performed at [facility], [address] [place], [place] [address]   Blood Culture (routine x 2)     Status: None (Preliminary result)   Collection Time: 10/23/23  2:08 AM  Specimen: BLOOD  Result Value Ref Range Status   Specimen Description BLOOD BLOOD LEFT FOREARM  Final   Special Requests   Final    BOTTLES DRAWN AEROBIC AND ANAEROBIC Blood Culture adequate volume   Culture   Final    NO GROWTH 1 DAY Performed at [facility], [address] [place], [place] [address]    Report Status PENDING  Incomplete  Respiratory (~20 pathogens) panel by PCR     Status: None   Collection Time: 10/23/23 11:48 AM   Specimen: Nasopharyngeal Swab; Respiratory  Result Value Ref Range Status   Adenovirus NOT DETECTED NOT DETECTED Final   Coronavirus 229E NOT DETECTED NOT DETECTED Final    Comment: (NOTE) The Coronavirus on the Respiratory Panel, DOES NOT test for the novel  Coronavirus (2019 nCoV)    Coronavirus HKU1 NOT DETECTED NOT DETECTED Final   Coronavirus NL63 NOT DETECTED NOT DETECTED Final   Coronavirus OC43 NOT DETECTED NOT DETECTED Final   Metapneumovirus NOT DETECTED NOT DETECTED Final   Rhinovirus / Enterovirus NOT DETECTED NOT DETECTED Final   Influenza A NOT DETECTED NOT DETECTED Final   Influenza B NOT DETECTED NOT DETECTED Final   Parainfluenza Virus 1 NOT DETECTED NOT DETECTED Final   Parainfluenza Virus 2 NOT DETECTED NOT DETECTED Final   Parainfluenza Virus 3 NOT DETECTED NOT DETECTED Final   Parainfluenza Virus 4 NOT DETECTED NOT DETECTED Final   Respiratory Syncytial Virus NOT DETECTED NOT DETECTED Final   Bordetella pertussis NOT DETECTED NOT DETECTED  Final   Bordetella Parapertussis NOT DETECTED NOT DETECTED Final   Chlamydophila pneumoniae NOT DETECTED NOT DETECTED Final   Mycoplasma pneumoniae NOT DETECTED NOT DETECTED Final    Comment: Performed at [facility], [address], [place], [place] [address]  Culture, blood (Routine X 2) w Reflex to ID Panel     Status: None (Preliminary result)   Collection Time: 10/24/23  3:42 AM   Specimen: BLOOD  Result Value Ref Range Status   Specimen Description BLOOD BLOOD RIGHT ARM  Final   Special Requests   Final    BOTTLES DRAWN AEROBIC AND ANAEROBIC Blood Culture results may not be optimal due to an inadequate volume of blood received in culture bottles   Culture   Final    NO GROWTH < 12 HOURS Performed at [facility], [address] [place], [place] [address]    Report Status PENDING  Incomplete    Procedures and diagnostic studies:  CT Angio Chest PE W and/or Wo Contrast Result Date: 10/23/2023 CLINICAL DATA:  Pulmonary embolism suspected. Shortness of breath with dizziness EXAM: CT ANGIOGRAPHY CHEST WITH CONTRAST TECHNIQUE: Multidetector CT imaging of the chest was performed using the standard protocol during bolus administration of intravenous contrast. Multiplanar CT image reconstructions and MIPs were obtained to evaluate the vascular anatomy. RADIATION DOSE REDUCTION: This exam was performed according to the departmental dose-optimization program which includes automated exposure control, adjustment of the mA and/or kV according to patient size and/or use of iterative reconstruction technique. CONTRAST:  OMNIPAQUE  IOHEXOL  350 MG/ML SOLN COMPARISON:  08/14/2023 FINDINGS: Cardiovascular: Satisfactory opacification of the pulmonary arteries to the segmental level. No evidence of pulmonary embolism. Normal heart size. No pericardial effusion. Mediastinum/Nodes: Chronic retrosternal nodule measuring 2.6 cm, thyroid  density on precontrast imaging from previous year,  still most consistent with retrosternal goiter. There is thickening of hilar lymph nodes not seen previously, considered reactive. Lungs/Pleura: Chronic lung disease with airway thickening and  mosaic attenuation of the lungs diffusely. Bands of scarring and atelectasis bilaterally. There are new perihilar ill-defined masslike opacities which are multifocal and bilateral affecting upper and lower lobes. The largest on the right is lateral to the hilum measuring 4 cm on 6:70. On the left a lingular nodule measures 3 cm. No edema, effusion, or pneumothorax. Upper Abdomen: No acute finding Musculoskeletal: No acute finding Review of the MIP images confirms the above findings. IMPRESSION: Nodular and masslike consolidation in the bilateral perihilar lung which is multifocal. Rapid development since 08/14/2023 is most consistent with infectious/inflammatory process, consider atypical pathogens given the pattern. Recommend chest CT follow-up in 3 months. Negative for pulmonary embolism. Electronically Signed   By: Dorn Roulette M.D.   On: 10/23/2023 04:13   DG Chest 2 View Result Date: 10/23/2023 CLINICAL DATA:  Chest pain and shortness of breath. EXAM: CHEST - 2 VIEW COMPARISON:  August 14, 2023 FINDINGS: The heart size and mediastinal contours are within normal limits. Stable, chronic areas of scarring and/or atelectasis are seen within the bilateral lung bases and mid lung fields. Small, ill-defined focal opacities are seen overlying the bilateral perihilar regions. This represents a new finding when compared to the prior study. No pleural effusion or pneumothorax is identified. The visualized skeletal structures are unremarkable. IMPRESSION: 1. Chronic bibasilar and mid lung field scarring and/or atelectasis. 2. Small, ill-defined focal opacities overlying the bilateral perihilar regions which may represent focal infiltrates. Further evaluation with chest CT is recommended to exclude the presence of an  underlying neoplastic process. Electronically Signed   By: Suzen Dials M.D.   On: 10/23/2023 00:28               LOS: 1 day   Raegan Sipp  [facility]   Pager on [email or web address]. If 7PM-7AM, please contact night-coverage at [email or web address]     10/24/2023, 1:05 PM

## 2023-10-24 NOTE — Consult Note (Signed)
 PULMONOLOGY         Date: 10/24/2023,   MRN# [id number] Catherine Giles December 26, 1970     AdmissionWeight: 78.9 kg                 CurrentWeight: 78.9 kg  Referring provider: Dr Laurita   CHIEF COMPLAINT:   Abnormal CT chest  HISTORY OF PRESENT ILLNESS   This is a 53 yo with history of aRA, COPD lifelong smoking since age 88 smoking 1ppd, Asthma/COPD, SLE, hypothyroidism.  She reports feeling severe fatigue and overal malaise over past 3 months.  She reports subjective fevers.  She had CTPE done with no PE noted but did have mass like consolidations.  PCCM consultation was placed for further evaluation.  10/24/23- patient seen at bedside, she is on 1-2L/min.  Her lung auscultation is improved today.  She has multiple tests still in process but thus far erverything is negative.  She is asking for nicotine  patch. Have ordered this today. Sputum cultures collected in process. RVP is negative.   PAST MEDICAL HISTORY   Past Medical History:  Diagnosis Date  . Arthritis    RA  . Asthma   . COPD (chronic obstructive pulmonary disease) (HCC)   . Depression   . Diabetes mellitus without complication (HCC)   . Emphysema of lung (HCC)   . Family history of breast cancer    6/21 cancer genetic testing letter sent  . Fibromyalgia   . Hypertension   . Lupus   . Pre-diabetes   . Thyroid  disease      SURGICAL HISTORY   Past Surgical History:  Procedure Laterality Date  . CHOLECYSTECTOMY, LAPAROSCOPIC  02/2022  . MOUTH SURGERY    . TRANSTHORACIC ECHOCARDIOGRAM  04/2020   EF 60 to 65%.  Normal LV function.  No R WMA.  GR 1 DD.  Normal RV size and function.  Normal PAP.  Normal atrial size.  Normal valves.     FAMILY HISTORY   Family History  Problem Relation Age of Onset  . Lung cancer Mother   . Diabetes Mother   . Breast cancer Mother 76  . Hypertension Father   . Cancer Maternal Aunt   . Ovarian cancer Maternal Aunt   . Diabetes Maternal Aunt   . Diabetes  Maternal Uncle   . Hypertension Maternal Grandmother   . Diabetes Maternal Grandmother      SOCIAL HISTORY   Social History   Tobacco Use  . Smoking status: Every Day    Current packs/day: 1.00    Average packs/day: 1 pack/day for 34.0 years (34.0 ttl pk-yrs)    Types: Cigarettes  . Smokeless tobacco: Never  . Tobacco comments:    2-3 cig/day  Vaping Use  . Vaping status: Never Used  Substance Use Topics  . Alcohol use: Yes    Comment: Occ  . Drug use: Not Currently     MEDICATIONS    Home Medication:     Current Medication:  Current Facility-Administered Medications:  .  acetaminophen  (TYLENOL ) tablet 650 mg, 650 mg, Oral, Q6H PRN **OR** acetaminophen  (TYLENOL ) suppository 650 mg, 650 mg, Rectal, Q6H PRN, Laurita Manor T, MD .  albuterol  (PROVENTIL ) (2.5 MG/3ML) 0.083% nebulizer solution 2.5 mg, 2.5 mg, Nebulization, Q6H PRN, Belue, Rankin RAMAN, RPH .  amLODipine  (NORVASC ) tablet 5 mg, 5 mg, Oral, Daily, Zhang, Ping T, MD .  benzonatate  (TESSALON ) capsule 100 mg, 100 mg, Oral, TID PRN, Laurita Manor DASEN, MD .  cyclobenzaprine  (FLEXERIL ) tablet 5 mg, 5 mg, Oral, QHS, Laurita Manor T, MD, 5 mg at 10/23/23 2143 .  fluticasone  furoate-vilanterol (BREO ELLIPTA ) 200-25 MCG/ACT 1 puff, 1 puff, Inhalation, Daily **AND** umeclidinium bromide  (INCRUSE ELLIPTA ) 62.5 MCG/ACT 1 puff, 1 puff, Inhalation, Daily, Zhang, Manor T, MD .  fondaparinux  (ARIXTRA ) injection 2.5 mg, 2.5 mg, Subcutaneous, Q24H, Zhang, Ping T, MD .  hydrALAZINE  (APRESOLINE ) injection 5 mg, 5 mg, Intravenous, Q6H PRN, Laurita Manor T, MD .  hydrOXYzine  (ATARAX ) tablet 25 mg, 25 mg, Oral, TID PRN, Zhang, Ping T, MD .  ipratropium (ATROVENT ) 0.06 % nasal spray 2 spray, 2 spray, Each Nare, QID, Zhang, Manor T, MD .  ketorolac  (TORADOL ) 15 MG/ML injection 15 mg, 15 mg, Intravenous, Q6H PRN, Zhang, Ping T, MD, 15 mg at 10/23/23 1644 .  levofloxacin  (LEVAQUIN ) IVPB 750 mg, 750 mg, Intravenous, Q24H, Laurita Manor T, MD, Last Rate: 100  mL/hr at 10/24/23 0555, 750 mg at 10/24/23 0555 .  levothyroxine  (SYNTHROID ) tablet 50 mcg, 50 mcg, Oral, Q0600, Laurita Manor DASEN, MD, 50 mcg at 10/24/23 0551 .  loratadine  (CLARITIN ) tablet 10 mg, 10 mg, Oral, Daily, Laurita Manor T, MD, 10 mg at 10/23/23 1926 .  metoprolol  tartrate (LOPRESSOR ) injection 5 mg, 5 mg, Intravenous, Once, Laurita Manor T, MD .  montelukast  (SINGULAIR ) tablet 10 mg, 10 mg, Oral, Daily, Laurita Manor T, MD, 10 mg at 10/23/23 1926 .  ondansetron  (ZOFRAN ) tablet 4 mg, 4 mg, Oral, Q6H PRN **OR** ondansetron  (ZOFRAN ) injection 4 mg, 4 mg, Intravenous, Q6H PRN, Laurita Manor T, MD .  oxyCODONE  (Oxy IR/ROXICODONE ) immediate release tablet 5 mg, 5 mg, Oral, Q4H PRN, Zhang, Ping T, MD, 5 mg at 10/24/23 0805 .  potassium chloride  SA (KLOR-CON  M) CR tablet 40 mEq, 40 mEq, Oral, Once, Jens Durand, MD .  predniSONE  (DELTASONE ) tablet 40 mg, 40 mg, Oral, Q breakfast, Laurita Manor T, MD, 40 mg at 10/23/23 1146    ALLERGIES   Azithromycin, Molds & smuts, Alpha-gal, Penicillins, and Shellfish allergy     REVIEW OF SYSTEMS    Review of Systems:  Gen:  Denies  fever, sweats, chills weigh loss  HEENT: Denies blurred vision, double vision, ear pain, eye pain, hearing loss, nose bleeds, sore throat Cardiac:  No dizziness, chest pain or heaviness, chest tightness,edema Resp:   reports dyspnea chronically  Gi: Denies swallowing difficulty, stomach pain, nausea or vomiting, diarrhea, constipation, bowel incontinence Gu:  Denies bladder incontinence, burning urine Ext:   Denies Joint pain, stiffness or swelling Skin: Denies  skin rash, easy bruising or bleeding or hives Endoc:  Denies polyuria, polydipsia , polyphagia or weight change Psych:   Denies depression, insomnia or hallucinations   Other:  All other systems negative   VS: BP 129/77   Pulse 97   Temp 98.5 F (36.9 C) (Oral)   Resp 19   Ht 5' 3 (1.6 m)   Wt 78.9 kg   SpO2 90%   BMI 30.82 kg/m      PHYSICAL EXAM     GENERAL:NAD, no fevers, chills, no weakness no fatigue HEAD: Normocephalic, atraumatic.  EYES: Pupils equal, round, reactive to light. Extraocular muscles intact. No scleral icterus.  MOUTH: Moist mucosal membrane. Dentition intact. No abscess noted.  EAR, NOSE, THROAT: Clear without exudates. No external lesions.  NECK: Supple. No thyromegaly. No nodules. No JVD.  PULMONARY: decreased breath sounds with mild rhonchi worse at bases bilaterally.  CARDIOVASCULAR: S1 and S2. Regular rate and rhythm. No murmurs,  rubs, or gallops. No edema. Pedal pulses 2+ bilaterally.  GASTROINTESTINAL: Soft, nontender, nondistended. No masses. Positive bowel sounds. No hepatosplenomegaly.  MUSCULOSKELETAL: No swelling, clubbing, or edema. Range of motion full in all extremities.  NEUROLOGIC: Cranial nerves II through XII are intact. No gross focal neurological deficits. Sensation intact. Reflexes intact.  SKIN: No ulceration, lesions, rashes, or cyanosis. Skin warm and dry. Turgor intact.  PSYCHIATRIC: Mood, affect within normal limits. The patient is awake, alert and oriented x 3. Insight, judgment intact.       IMAGING     ASSESSMENT/PLAN   Multiple pulmonary nodular opacities    Patient has history of RA and may have RA related lung nodules , there is also higher susceptibility to opportunistic infections. She takes Breo ellipta  Patient also has significant smoking history with overall pre-test probability of lung cancer at moderate risk -will order serology for beta d glucan , cryptococcal antigen, aspergillus -patient is currently on antibiotics with levofloxacin . -patient empirically started on prednisone  for COPD with exacerbation and is on nebulizer therapy -respiratory cultures today -COVID/FLU/RSV negative -will continue to follow        Thank you for allowing me to participate in the care of this patient.   Patient/Family are satisfied with care plan and all questions have been  answered.    Provider disclosure: Patient with at least one acute or chronic illness or injury that poses a threat to life or bodily function and is being managed actively during this encounter.  All of the below services have been performed independently by signing provider:  review of prior documentation from internal and or external health records.  Review of previous and current lab results.  Interview and comprehensive assessment during patient visit today. Review of current and previous chest radiographs/CT scans. Discussion of management and test interpretation with health care team and patient/family.   This document was prepared using Dragon voice recognition software and may include unintentional dictation errors.     Lamaya Hyneman, M.D.  Division of Pulmonary & Critical Care Medicine

## 2023-10-24 NOTE — Plan of Care (Signed)
  Problem: Clinical Measurements: Goal: Ability to maintain clinical measurements within normal limits will improve Outcome: Progressing   Problem: Pain Management: Goal: General experience of comfort will improve Outcome: Progressing   Problem: Safety: Goal: Ability to remain free from injury will improve Outcome: Progressing

## 2023-10-25 DIAGNOSIS — A419 Sepsis, unspecified organism: Secondary | ICD-10-CM | POA: Diagnosis not present

## 2023-10-25 DIAGNOSIS — J189 Pneumonia, unspecified organism: Secondary | ICD-10-CM | POA: Diagnosis not present

## 2023-10-25 LAB — BASIC METABOLIC PANEL
Anion gap: 10 (ref 5–15)
BUN: 13 mg/dL (ref 6–20)
CO2: 23 mmol/L (ref 22–32)
Calcium: 8.8 mg/dL — ABNORMAL LOW (ref 8.9–10.3)
Chloride: 105 mmol/L (ref 98–111)
Creatinine, Ser: 0.42 mg/dL — ABNORMAL LOW (ref 0.44–1.00)
GFR, Estimated: >60 mL/min (ref >60)
Glucose, Bld: 99 mg/dL (ref 70–99)
Potassium: 3.5 mmol/L (ref 3.5–5.1)
Sodium: 138 mmol/L (ref 135–145)

## 2023-10-25 LAB — CBC WITH DIFFERENTIAL/PLATELET
Abs Immature Granulocytes: 0.07 10*3/uL (ref 0.00–0.07)
Basophils Absolute: 0 10*3/uL (ref 0.0–0.1)
Basophils Relative: 0 %
Eosinophils Absolute: 0 10*3/uL (ref 0.0–0.5)
Eosinophils Relative: 0 %
HCT: 38 % (ref 36.0–46.0)
Hemoglobin: 12.6 g/dL (ref 12.0–15.0)
Immature Granulocytes: 0 %
Lymphocytes Relative: 32 %
Lymphs Abs: 5.1 10*3/uL — ABNORMAL HIGH (ref 0.7–4.0)
MCH: 30.4 pg (ref 26.0–34.0)
MCHC: 33.2 g/dL (ref 30.0–36.0)
MCV: 91.6 fL (ref 80.0–100.0)
Monocytes Absolute: 0.7 10*3/uL (ref 0.1–1.0)
Monocytes Relative: 4 %
Neutro Abs: 10.1 10*3/uL — ABNORMAL HIGH (ref 1.7–7.7)
Neutrophils Relative %: 64 %
Platelets: 353 10*3/uL (ref 150–400)
RBC: 4.15 MIL/uL (ref 3.87–5.11)
RDW: 14.2 % (ref 11.5–15.5)
WBC: 16 10*3/uL — ABNORMAL HIGH (ref 4.0–10.5)
nRBC: 0 % (ref 0.0–0.2)

## 2023-10-25 LAB — GLUCOSE, CAPILLARY
Glucose-Capillary: 185 mg/dL — ABNORMAL HIGH (ref 70–99)
Glucose-Capillary: 112 mg/dL — ABNORMAL HIGH (ref 70–99)
Glucose-Capillary: 105 mg/dL — ABNORMAL HIGH (ref 70–99)
Glucose-Capillary: 151 mg/dL — ABNORMAL HIGH (ref 70–99)
Glucose-Capillary: 234 mg/dL — ABNORMAL HIGH (ref 70–99)

## 2023-10-25 LAB — LACTIC ACID, PLASMA: Lactic Acid, Venous: 1 mmol/L (ref 0.5–1.9)

## 2023-10-25 LAB — HEMOGLOBIN A1C
Hgb A1c MFr Bld: 6.8 % — ABNORMAL HIGH (ref 4.8–5.6)
Mean Plasma Glucose: 148 mg/dL

## 2023-10-25 MED ORDER — LOSARTAN POTASSIUM 50 MG PO TABS
100.0000 mg | ORAL_TABLET | Freq: Every day | ORAL | Status: DC
  Administered 2023-10-25 – 2023-10-26 (×2): 100 mg via ORAL
  Filled 2023-10-25 (×2): qty 2

## 2023-10-25 MED ORDER — ATORVASTATIN CALCIUM 20 MG PO TABS
40.0000 mg | ORAL_TABLET | Freq: Every day | ORAL | Status: DC
  Administered 2023-10-25 – 2023-10-26 (×2): 40 mg via ORAL
  Filled 2023-10-25 (×2): qty 2

## 2023-10-25 MED ORDER — METOPROLOL SUCCINATE ER 25 MG PO TB24
25.0000 mg | ORAL_TABLET | Freq: Every day | ORAL | Status: DC
  Administered 2023-10-25 – 2023-10-26 (×2): 25 mg via ORAL
  Filled 2023-10-25 (×2): qty 1

## 2023-10-25 MED ORDER — POTASSIUM CHLORIDE CRYS ER 20 MEQ PO TBCR
40.0000 meq | EXTENDED_RELEASE_TABLET | Freq: Once | ORAL | Status: AC
  Administered 2023-10-25: 40 meq via ORAL
  Filled 2023-10-25: qty 2

## 2023-10-25 NOTE — Progress Notes (Addendum)
 Progress Note    Catherine Giles  FMW:[id number] DOB: 12/23/1970  DOA: 10/23/2023 PCP: [facility]      Brief Narrative:    Medical records reviewed and are as summarized below:  Catherine Giles is a 53 y.o. female  with medical history significant for RA on DMARDs, asthma/COPD Gold stage II, HTN, lupus, hypothyroidism, presented with worsening of cough, chest pain, fever.   She was admitted to the hospital for sepsis secondary to multifocal pneumonia and COPD exacerbation.   Assessment/Plan:   Principal Problem:   Sepsis due to pneumonia [facility]) Active Problems:   Pneumonia    Body mass index is 30.82 kg/m.  (Obesity)   Severe sepsis secondary to multifocal pneumonia in an immunocompromised patient: Continue IV Levaquin .  Follow-up blood cultures.  Fungitell, cryptococcus, Aspergillus, mycoplasma and Legionella test are pending. Lactic acidosis has resolved.  Lactic acid was 2.4 on admission. Leukocytosis is improving   COPD exacerbation: Continue steroids and bronchodilators Chronic hypoxic respiratory failure: Continue 2 L/min oxygen via nasal cannula   Severe pleuritic chest pain: This is likely due to pneumonia.  Continue analgesics as needed for pain. CTA negative for acute pulmonary embolism.   Bilateral lung opacities: Likely osteoarthritis lateral to the hilum measuring 4 cm and left lingular nodule measuring 3 cm: Outpatient follow-up with CT chest recommended in 3 months.   Hypokalemia: Improving.  Continue potassium repletion.   Type II DM with hyperglycemia: NovoLog  as needed for hyperglycemia.  Continue Farxiga .  Hemoglobin A1c is pending.    Comorbidities include hypertension, rheumatoid arthritis (s/p abatacept / Orencia infusion on 09/04/2023), hypothyroidism, tobacco use disorder, depression (prefers to continue with Cymbalta )   Patient said she does not like any kind of injection into her abdomen and  requested that fondaparinux , ordered for DVT prophylaxis, be discontinued.  She said that she does not have any blood clot and does not know why she was on a blood thinner.  It was explained that she was only getting a small dose to prevent blood clot/thromboembolism.  She still wants anticoagulant to be discontinued.  She understands that she is at increased risk for blood clot because of current hospitalization.  SCD has been ordered for DVT prophylaxis. She also had questions about her antihypertensives. Home medications and indications were discussed and she verbalized understanding.   Diet Order             Diet heart healthy/carb modified Room service appropriate? Yes; Fluid consistency: Thin  Diet effective now                            Consultants: Pulmonologist  Procedures: None    Medications:    amLODipine   5 mg Oral Daily   atorvastatin   40 mg Oral Daily   cyclobenzaprine   5 mg Oral QHS   dapagliflozin  propanediol  10 mg Oral QAC breakfast   docusate sodium   100 mg Oral BID   DULoxetine   60 mg Oral Daily   fluticasone  furoate-vilanterol  1 puff Inhalation Daily   And   umeclidinium bromide   1 puff Inhalation Daily   insulin  aspart  0-15 Units Subcutaneous TID WC   ipratropium  2 spray Each Nare QID   levothyroxine   50 mcg Oral Q0600   loratadine   10 mg Oral Daily   losartan   100 mg Oral Daily   metoprolol  succinate  25 mg Oral Daily   metoprolol  tartrate  5 mg Intravenous Once   montelukast   10 mg Oral Daily   nicotine   14 mg Transdermal Daily   predniSONE   40 mg Oral Q breakfast   Continuous Infusions:  levofloxacin  (LEVAQUIN ) IV 750 mg (10/25/23 0529)     Anti-infectives (From admission, onward)    Start     Dose/Rate Route Frequency Ordered Stop   10/24/23 0600  levofloxacin  (LEVAQUIN ) IVPB 750 mg        750 mg 100 mL/hr over 90 Minutes Intravenous Every 24 hours 10/23/23 0905     10/23/23 0900  cefTRIAXone (ROCEPHIN) 2 g in sodium  chloride 0.9 % 100 mL IVPB  Status:  Discontinued        2 g 200 mL/hr over 30 Minutes Intravenous Every 24 hours 10/23/23 0847 10/23/23 0905   10/23/23 0900  azithromycin (ZITHROMAX) 500 mg in sodium chloride  0.9 % 250 mL IVPB  Status:  Discontinued        500 mg 250 mL/hr over 60 Minutes Intravenous Every 24 hours 10/23/23 0847 10/23/23 0905   10/23/23 0115  levofloxacin  (LEVAQUIN ) IVPB 750 mg        750 mg 100 mL/hr over 90 Minutes Intravenous  Once 10/23/23 0112 10/23/23 0207              Family Communication/Anticipated D/C date and plan/Code Status   DVT prophylaxis: Place and maintain sequential compression device Start: 10/25/23 1107     Code Status: Full Code  Family Communication: None Disposition Plan: Plan to discharge home   Status is: Inpatient Remains inpatient appropriate because: Pneumonia       Subjective:   Interval events noted.  She feels a little better today.  Chest pain and breathing better compared to yesterday.  She feels overwhelmed with everything that is going on.  She said she had not taken Cymbalta  for about 6 to 7 months but she prefers to continue Cymbalta  to help with her depression.  Objective:    Vitals:   10/24/23 0738 10/24/23 1536 10/24/23 2355 10/25/23 0752  BP: 129/77 (!) 141/91 (!) 140/62 (!) 166/92  Pulse: 97 93 92 92  Resp: 19 16 18 16   Temp: 98.5 F (36.9 C) 98.6 F (37 C) (!) 97.4 F (36.3 C) 98.1 F (36.7 C)  TempSrc: Oral Oral  Oral  SpO2: 90% 91% 92% 95%  Weight:      Height:       No data found.   Intake/Output Summary (Last 24 hours) at 10/25/2023 1437 Last data filed at 10/25/2023 0615 Gross per 24 hour  Intake 465.37 ml  Output --  Net 465.37 ml   Filed Weights   10/22/23 2241  Weight: 78.9 kg    Exam:  GEN: NAD SKIN: Warm and dry EYES: No pallor or icterus ENT: MMM CV: RRR PULM: Bilateral expiratory wheezing, no rales heard ABD: soft, ND, NT, +BS CNS: AAO x 3, non focal EXT: No  edema or tenderness         Data Reviewed:   I have personally reviewed following labs and imaging studies:  Labs: Labs show the following:   Basic Metabolic Panel: Recent Labs  Lab 10/22/23 2246 10/23/23 0208 10/24/23 0342 10/25/23 0540  NA 135  --  139 138  K 3.0*  --  3.3* 3.5  CL 99  --  104 105  CO2 23  --  25 23  GLUCOSE 161*  --  262* 99  BUN 7  --  18 13  CREATININE 0.45  --  0.62 0.42*  CALCIUM  8.3*  --  9.3 8.8*  MG  --  2.0  --   --    GFR Estimated Creatinine Clearance: 80 mL/min (A) (by C-G formula based on SCr of 0.42 mg/dL (L)). Liver Function Tests: No results for input(s): AST, ALT, ALKPHOS, BILITOT, PROT, ALBUMIN in the last 168 hours. No results for input(s): LIPASE, AMYLASE in the last 168 hours. No results for input(s): AMMONIA in the last 168 hours. Coagulation profile No results for input(s): INR, PROTIME in the last 168 hours.  CBC: Recent Labs  Lab 10/22/23 2246 10/24/23 0342 10/25/23 0704  WBC 21.6* 20.8* 16.0*  NEUTROABS  --   --  10.1*  HGB 13.4 12.5 12.6  HCT 40.4 38.0 38.0  MCV 93.7 92.2 91.6  PLT 336 310 353   Cardiac Enzymes: No results for input(s): CKTOTAL, CKMB, CKMBINDEX, TROPONINI in the last 168 hours. BNP (last 3 results) No results for input(s): PROBNP in the last 8760 hours. CBG: Recent Labs  Lab 10/24/23 0809 10/24/23 2105 10/25/23 0112 10/25/23 0750 10/25/23 1137  GLUCAP 215* 392* 185* 112* 105*   D-Dimer: No results for input(s): DDIMER in the last 72 hours. Hgb A1c: No results for input(s): HGBA1C in the last 72 hours. Lipid Profile: No results for input(s): CHOL, HDL, LDLCALC, TRIG, CHOLHDL, LDLDIRECT in the last 72 hours. Thyroid  function studies: No results for input(s): TSH, T4TOTAL, T3FREE, THYROIDAB in the last 72 hours.  Invalid input(s): FREET3 Anemia work up: No results for input(s): VITAMINB12, FOLATE, FERRITIN, TIBC,  IRON, RETICCTPCT in the last 72 hours. Sepsis Labs: Recent Labs  Lab 10/22/23 2246 10/23/23 0208 10/23/23 0400 10/23/23 1148 10/24/23 0342 10/25/23 0540 10/25/23 0704  WBC 21.6*  --   --   --  20.8*  --  16.0*  LATICACIDVEN  --    < > 2.2* 2.3* 2.3* 1.0  --    < > = values in this interval not displayed.    Microbiology Recent Results (from the past 240 hours)  Resp panel by RT-PCR (RSV, Flu A&B, Covid) Anterior Nasal Swab     Status: None   Collection Time: 10/22/23 10:46 PM   Specimen: Anterior Nasal Swab  Result Value Ref Range Status   SARS Coronavirus 2 by RT PCR NEGATIVE NEGATIVE Final    Comment: (NOTE) SARS-CoV-2 target nucleic acids are NOT DETECTED.  The SARS-CoV-2 RNA is generally detectable in upper respiratory specimens during the acute phase of infection. The lowest concentration of SARS-CoV-2 viral copies this assay can detect is 138 copies/mL. A negative result does not preclude SARS-Cov-2 infection and should not be used as the sole basis for treatment or other patient management decisions. A negative result may occur with  improper specimen collection/handling, submission of specimen other than nasopharyngeal swab, presence of viral mutation(s) within the areas targeted by this assay, and inadequate number of viral copies(<138 copies/mL). A negative result must be combined with clinical observations, patient history, and epidemiological information. The expected result is Negative.  Fact Sheet for Patients:  bloggercourse.com  Fact Sheet for Healthcare Providers:  seriousbroker.it  This test is no t yet approved or cleared by the [place]  FDA and  has been authorized for detection and/or diagnosis of SARS-CoV-2 by FDA under an Emergency Use Authorization (EUA). This EUA will remain  in effect (meaning this test can be used) for the duration of the COVID-19 declaration under Section 564(b)(1)  of the Act, 21 U.S.C.section  360bbb-3(b)(1), unless the authorization is terminated  or revoked sooner.       Influenza A by PCR NEGATIVE NEGATIVE Final   Influenza B by PCR NEGATIVE NEGATIVE Final    Comment: (NOTE) The Xpert Xpress SARS-CoV-2/FLU/RSV plus assay is intended as an aid in the diagnosis of influenza from Nasopharyngeal swab specimens and should not be used as a sole basis for treatment. Nasal washings and aspirates are unacceptable for Xpert Xpress SARS-CoV-2/FLU/RSV testing.  Fact Sheet for Patients: bloggercourse.com  Fact Sheet for Healthcare Providers: seriousbroker.it  This test is not yet approved or cleared by the [place]  FDA and has been authorized for detection and/or diagnosis of SARS-CoV-2 by FDA under an Emergency Use Authorization (EUA). This EUA will remain in effect (meaning this test can be used) for the duration of the COVID-19 declaration under Section 564(b)(1) of the Act, 21 U.S.C. section 360bbb-3(b)(1), unless the authorization is terminated or revoked.     Resp Syncytial Virus by PCR NEGATIVE NEGATIVE Final    Comment: (NOTE) Fact Sheet for Patients: bloggercourse.com  Fact Sheet for Healthcare Providers: seriousbroker.it  This test is not yet approved or cleared by the [place]  FDA and has been authorized for detection and/or diagnosis of SARS-CoV-2 by FDA under an Emergency Use Authorization (EUA). This EUA will remain in effect (meaning this test can be used) for the duration of the COVID-19 declaration under Section 564(b)(1) of the Act, 21 U.S.C. section 360bbb-3(b)(1), unless the authorization is terminated or revoked.  Performed at [facility], [address] [place], [place] [address]   Blood Culture (routine x 2)     Status: None (Preliminary result)   Collection Time: 10/23/23  2:08 AM   Specimen:  BLOOD  Result Value Ref Range Status   Specimen Description BLOOD BLOOD LEFT FOREARM  Final   Special Requests   Final    BOTTLES DRAWN AEROBIC AND ANAEROBIC Blood Culture adequate volume   Culture   Final    NO GROWTH 2 DAYS Performed at [facility], [address] [place], [place] [address]    Report Status PENDING  Incomplete  Respiratory (~20 pathogens) panel by PCR     Status: None   Collection Time: 10/23/23 11:48 AM   Specimen: Nasopharyngeal Swab; Respiratory  Result Value Ref Range Status   Adenovirus NOT DETECTED NOT DETECTED Final   Coronavirus 229E NOT DETECTED NOT DETECTED Final    Comment: (NOTE) The Coronavirus on the Respiratory Panel, DOES NOT test for the novel  Coronavirus (2019 nCoV)    Coronavirus HKU1 NOT DETECTED NOT DETECTED Final   Coronavirus NL63 NOT DETECTED NOT DETECTED Final   Coronavirus OC43 NOT DETECTED NOT DETECTED Final   Metapneumovirus NOT DETECTED NOT DETECTED Final   Rhinovirus / Enterovirus NOT DETECTED NOT DETECTED Final   Influenza A NOT DETECTED NOT DETECTED Final   Influenza B NOT DETECTED NOT DETECTED Final   Parainfluenza Virus 1 NOT DETECTED NOT DETECTED Final   Parainfluenza Virus 2 NOT DETECTED NOT DETECTED Final   Parainfluenza Virus 3 NOT DETECTED NOT DETECTED Final   Parainfluenza Virus 4 NOT DETECTED NOT DETECTED Final   Respiratory Syncytial Virus NOT DETECTED NOT DETECTED Final   Bordetella pertussis NOT DETECTED NOT DETECTED Final   Bordetella Parapertussis NOT DETECTED NOT DETECTED Final   Chlamydophila pneumoniae NOT DETECTED NOT DETECTED Final   Mycoplasma pneumoniae NOT DETECTED NOT DETECTED Final    Comment: Performed at [facility], [address]  [place], [place] [address]  Culture, blood (Routine X 2) w Reflex to ID Panel     Status: None (Preliminary result)   Collection Time: 10/24/23  3:42 AM   Specimen: BLOOD  Result Value Ref Range Status   Specimen Description BLOOD BLOOD RIGHT ARM   Final   Special Requests   Final    BOTTLES DRAWN AEROBIC AND ANAEROBIC Blood Culture results may not be optimal due to an inadequate volume of blood received in culture bottles   Culture   Final    NO GROWTH 1 DAY Performed at [facility], [address], [place], [place] [address]    Report Status PENDING  Incomplete  Expectorated Sputum Assessment w Gram Stain, Rflx to Resp Cult     Status: None   Collection Time: 10/24/23  7:42 AM   Specimen: SPU  Result Value Ref Range Status   Specimen Description SPUTUM  Final   Special Requests NONE  Final   Sputum evaluation   Final    THIS SPECIMEN IS ACCEPTABLE FOR SPUTUM CULTURE Performed at [facility], [address], [place], [place] [address]    Report Status 10/24/2023 FINAL  Final  Culture, Respiratory w Gram Stain     Status: None (Preliminary result)   Collection Time: 10/24/23  7:42 AM   Specimen: SPU  Result Value Ref Range Status   Specimen Description   Final    SPUTUM Performed at [facility], [address], [place], [place] [address]    Special Requests   Final    NONE Reflexed from [id number] Performed at [facility], [address] [place], [place] [address]    Gram Stain   Final    RARE WBC PRESENT, PREDOMINANTLY MONONUCLEAR RARE GRAM POSITIVE COCCI IN PAIRS AND CHAINS Performed at [facility], [address], [place], [place] [address]    Culture PENDING  Incomplete   Report Status PENDING  Incomplete    Procedures and diagnostic studies:  No results found.              LOS: 2 days   Shelle Galdamez  [facility]   Pager on [email or web address]. If 7PM-7AM, please contact night-coverage at [email or web address]     10/25/2023, 2:37 PM

## 2023-10-25 NOTE — Inpatient Diabetes Management (Signed)
 Inpatient Diabetes Program Recommendations  AACE/ADA: New Consensus Statement on Inpatient Glycemic Control (2025)  Target Ranges:  Prepandial:   less than 140 mg/dL      Peak postprandial:   less than 180 mg/dL (1-2 hours)      Critically ill patients:  140 - 180 mg/dL   Lab Results  Component Value Date   GLUCAP 112 (H) 10/25/2023   HGBA1C 6.5 (H) 12/02/2021    Review of Glycemic Control  Latest Reference Range & Units 10/24/23 08:09 10/24/23 21:05 10/25/23 01:12 10/25/23 07:50  Glucose-Capillary 70 - 99 mg/dL 784 (H) 607 (H) 814 (H) 112 (H)   Diabetes history: DM 2 Outpatient Diabetes medications:  Farxiga  10 mg daily Current orders for Inpatient glycemic control:  Novolog  moderate tid with meals  Farxiga  10 mg daily Prednisone  40 mg daily  Inpatient Diabetes Program Recommendations:    Note blood sugar elevated last night to 392 mg/dL.  A1C is pending.  Patient is on steroids in hospital which is likely increasing CBG's.    -May consider adding Novolog  meal coverage 3 units tid with meals (hold if patient eats less than 50% or NPO) while on Prednisone .   Thanks,  Randall Bullocks, RN, BC-ADM Inpatient Diabetes Coordinator Pager [phone]  (8a-5p)

## 2023-10-25 NOTE — TOC Initial Note (Signed)
 Transition of Care [facility]) - Initial/Assessment Note    Patient Details  Name: Catherine Giles MRN: [id number] Date of Birth: 1971/02/12  [facility]) CM/SW Contact:    Royanne JINNY Bernheim, RN Phone Number: 10/25/2023, 4:23 PM  Clinical Narrative:                   Transition of Care (TOC) Screening Note   Patient Details  Name: Catherine Giles Date of Birth: [birth year]   Transition of Care [facility]) CM/SW Contact:    Royanne JINNY Bernheim, RN Phone Number: 10/25/2023, 4:23 PM    Transition of Care Department [facility]) has reviewed patient and no TOC needs have been identified at this time. We will continue to monitor patient advancement through interdisciplinary progression rounds. If new patient transition needs arise, please place a TOC consult.         Patient Goals and CMS Choice            Expected Discharge Plan and Services                                              Prior Living Arrangements/Services                       Activities of Daily Living   ADL Screening (condition at time of admission) Independently performs ADLs?: Yes (appropriate for developmental age) Is the patient deaf or have difficulty hearing?: No Does the patient have difficulty seeing, even when wearing glasses/contacts?: No Does the patient have difficulty concentrating, remembering, or making decisions?: No  Permission Sought/Granted                  Emotional Assessment              Admission diagnosis:  Pneumonia [J18.9] Sepsis due to pneumonia (HCC) [J18.9, A41.9] Multifocal pneumonia [J18.9] Sepsis, due to unspecified organism, unspecified whether acute organ dysfunction present [facility]) [A41.9] Patient Active Problem List   Diagnosis Date Noted   Sepsis due to pneumonia (HCC) 10/23/2023   Pneumonia 10/23/2023   Hemoptysis 06/04/2023   Bronchopneumonia 10/09/2022   Influenza A 10/07/2022   Depression with anxiety 10/07/2022   COPD  exacerbation (HCC) 10/07/2022   HTN (hypertension) 10/07/2022   Tobacco abuse 10/07/2022   Chest pain 09/10/2022   COPD (chronic obstructive pulmonary disease) (HCC)    Hypothyroidism    Nicotine  dependence, cigarettes, uncomplicated    Chronic diastolic CHF (congestive heart failure) (HCC)    Sepsis (HCC)    Allergy to alpha-gal 01/07/2022   Depression, major, single episode, mild (HCC) 01/24/2021   Dairy allergy 10/12/2020   Vaginal stenosis 03/19/2020   Obesity 03/19/2020   Insomnia 01/05/2020   Complex regional pain syndrome I of upper limb 12/17/2019   Hypertension associated with diabetes (HCC) 12/13/2019   Type II diabetes mellitus with complication (HCC) 12/13/2019   Hashimoto's thyroiditis 12/13/2019   DDD (degenerative disc disease), lumbar 08/26/2019   Multinodular goiter 08/14/2019   Chronic left hip pain 05/05/2019   Osteoarthritis of spine with radiculopathy, cervical region 05/05/2019   Polypharmacy 02/14/2019   Fibromyalgia 02/10/2019   Rheumatoid arthritis involving multiple sites with positive rheumatoid factor (HCC) 02/10/2019   Cervical radiculopathy 03/13/2018   Vitamin D deficiency 10/11/2015   PCP:  [facility] Pharmacy:   [facility] -  [place], [place] - [address] [place] [address] Phone: [phone] Fax: [phone]  [facility] - [place], [place] - [address] [place] [address] Phone: [phone] Fax: [phone]  [id number] - [place] [facility] - [address] [place] [address] Phone: [phone] Fax: [phone]  [id number] [place],  - [address] [place] [address] Phone: [phone] Fax: [phone]     Social Drivers of Health (SDOH) Social History: SDOH Screenings   Food Insecurity: Food Insecurity Present (10/24/2023)  Housing: Low Risk  (10/24/2023)  Transportation Needs: No Transportation Needs (10/24/2023)   Utilities: Not At Risk (10/24/2023)  Alcohol Screen: Low Risk  (07/20/2021)  Depression (PHQ2-9): Medium Risk (12/02/2021)  Financial Resource Strain: Medium Risk (04/15/2021)  Physical Activity: Inactive (04/15/2021)  Social Connections: Moderately Isolated (05/27/2021)  Stress: Stress Concern Present (05/27/2021)  Tobacco Use: High Risk (10/22/2023)   SDOH Interventions: Food Insecurity Interventions: Inpatient TOC, Community Resources Provided   Readmission Risk Interventions     No data to display

## 2023-10-25 NOTE — Plan of Care (Signed)
  Problem: Education: Goal: Knowledge of General Education information will improve Description: Including pain rating scale, medication(s)/side effects and non-pharmacologic comfort measures Outcome: Progressing   Problem: Health Behavior/Discharge Planning: Goal: Ability to manage health-related needs will improve Outcome: Progressing   Problem: Clinical Measurements: Goal: Ability to maintain clinical measurements within normal limits will improve Outcome: Progressing Goal: Will remain free from infection Outcome: Progressing Goal: Diagnostic test results will improve Outcome: Progressing Goal: Respiratory complications will improve Outcome: Progressing Goal: Cardiovascular complication will be avoided Outcome: Progressing   Problem: Activity: Goal: Risk for activity intolerance will decrease Outcome: Progressing   Problem: Nutrition: Goal: Adequate nutrition will be maintained Outcome: Progressing   Problem: Coping: Goal: Level of anxiety will decrease Outcome: Progressing   Problem: Elimination: Goal: Will not experience complications related to bowel motility Outcome: Progressing Goal: Will not experience complications related to urinary retention Outcome: Progressing   Problem: Pain Management: Goal: General experience of comfort will improve Outcome: Progressing   Problem: Safety: Goal: Ability to remain free from injury will improve Outcome: Progressing   Problem: Skin Integrity: Goal: Risk for impaired skin integrity will decrease Outcome: Progressing   Problem: Education: Goal: Knowledge of disease or condition will improve Outcome: Progressing Goal: Knowledge of the prescribed therapeutic regimen will improve Outcome: Progressing Goal: Individualized Educational Video(s) Outcome: Progressing   Problem: Activity: Goal: Ability to tolerate increased activity will improve Outcome: Progressing Goal: Will verbalize the importance of balancing  activity with adequate rest periods Outcome: Progressing   Problem: Respiratory: Goal: Ability to maintain a clear airway will improve Outcome: Progressing Goal: Levels of oxygenation will improve Outcome: Progressing Goal: Ability to maintain adequate ventilation will improve Outcome: Progressing   Problem: Activity: Goal: Ability to tolerate increased activity will improve Outcome: Progressing   Problem: Clinical Measurements: Goal: Ability to maintain a body temperature in the normal range will improve Outcome: Progressing   Problem: Respiratory: Goal: Ability to maintain adequate ventilation will improve Outcome: Progressing Goal: Ability to maintain a clear airway will improve Outcome: Progressing   Problem: Education: Goal: Ability to describe self-care measures that may prevent or decrease complications (Diabetes Survival Skills Education) will improve Outcome: Progressing Goal: Individualized Educational Video(s) Outcome: Progressing   Problem: Coping: Goal: Ability to adjust to condition or change in health will improve Outcome: Progressing   Problem: Fluid Volume: Goal: Ability to maintain a balanced intake and output will improve Outcome: Progressing   Problem: Health Behavior/Discharge Planning: Goal: Ability to identify and utilize available resources and services will improve Outcome: Progressing Goal: Ability to manage health-related needs will improve Outcome: Progressing   Problem: Metabolic: Goal: Ability to maintain appropriate glucose levels will improve Outcome: Progressing   Problem: Nutritional: Goal: Maintenance of adequate nutrition will improve Outcome: Progressing Goal: Progress toward achieving an optimal weight will improve Outcome: Progressing   Problem: Skin Integrity: Goal: Risk for impaired skin integrity will decrease Outcome: Progressing   Problem: Tissue Perfusion: Goal: Adequacy of tissue perfusion will improve Outcome:  Progressing

## 2023-10-25 NOTE — Discharge Instructions (Signed)
 Food Resources  Agency Name: [facility] Address: [address], [place], [place] [address] Phone: [phone] Website: [email or web address] Service(s) Offered: Housing services, self-sufficiency, congregate meal program, weatherization program, Event organiser program, emergency food assistance,  housing counseling, home ownership program, wheels - to work program.  [employer] free for 60 and older at various locations from [employer], Monday-Friday:  [employer], [address] [place], [phone] -[facility], [address], [place] [phone]  -[facility], [address] [place] [phone]  -[address], [address], [place], [phone]  Agency Name: [employer] Address: [phone] [address], [address], [place], [place] [address] Phone: [phone] Website: [email or web address] Service(s) Offered: Home delivered hot, frozen, and emergency  meals. Grocery assistance program which matches  volunteers one-on-one with seniors unable to grocery shop  for themselves. Must be 60 years and older; less than 20  hours of in-home aide service, limited or no driving ability;  live alone or with someone with a disability; live in  [place].  Agency Name: [employer]) Address: [address], [place], [place] [address] Phone: [phone] Service(s) Offered: Food is served to shut-ins, homeless, elderly, and low income people in the community every Saturday (11:30 am-12:30 pm) and Sunday (12:30 pm-1:30pm). Volunteers also offer help and encouragement in seeking employment,  and spiritual guidance.  Agency Name: [employer] Address: [address] [place], [place] [address] Phone: [phone] Service(s) Offered: Child support services; child welfare services; food stamps; Medicaid; work first family assistance; and aid  with fuel,  rent, food and medicine.  Agency Name: [employer] Address: [address], [place], [place] Phone: [phone] Website: [email or web address] Services Offered: Monday 10:00am-12:00, 8:00pm-9:00pm, and Friday 10:00am-12:00.  Agency Name: [employer] [facility] Address: [address] [place], [place] [address] Phone: [phone] Website: [email or web address] Service(s) Offered: Serves weekday meals, open from 11:30 am- 1:00 pm., and 6:30-7:30pm, Monday-Wednesday-Friday distributes food 3:30-6pm, Monday-Wednesday-Friday.  Agency Name: [facility] Address: [address], [place], [place] Phone: [phone] Website: [email or web address] Services Offered: Distributes food the 4th Saturday of the month, starting at 8:00 am  Agency Name: [facility] Address: [phone] [place], [place] [address] Phone: [phone] Website: [email or web address] Service(s) Offered: [employer] life, weekly food pantry. Open Wednesdays from 10:00am-noon.  Agency Name: [facility] [employer] Address: [address] [place], [place], [place] Phone: [phone] Services Offered: Distributes food 9am-1pm, Monday-Thursday. Call for details.  Agency Name: [facility] Address: [address] [place], [place] [address] Phone: [phone] Website: [email or web address] Service(s) Offered: Games developer. Call for assistance.  Agency Name: [facility] Address: [address], [place], [place] [address] Phone: [phone] Service Offered: Emergency Food Pantry. Call for appointment.  Agency Name: [facility] Address: [address], [place], [place] [address] Phone: [phone] Website: [email or web address] Services Offered: Games developer. Call for details  Agency Name: [facility] Address: [address] [place], [place] Phone:  [phone] Website: [email or web address] Service(s) Offered: Emergency Food Pantry. Call for details.  Agency Name: [employer] [facility] Address: [address], [place], [place] [address] Phone: [phone] or [phone] Website: [email or web address] Service(s) Offered: Distribute food 9am-11:30 am, Tuesday-Friday, and 1-3:30pm, Monday-Friday. Food pantry Monday-Friday 1pm-3pm, fresh items, Mon.-Wed.-Fri.  Agency Name: [facility] Address: [address] [place], [place] [address] Phone: [phone] Website: [email or web address] Services Offered: Distribute food Tues and Sats from 9:00am-noon.  Closed 1st Saturday of each month. Call for details  Agency Name: [employer] Address: Fayrene Boatman [facility] [address], [place] [address] Phone: [phone]  Services Offered: Delivers meals every Thursday

## 2023-10-25 NOTE — Progress Notes (Signed)
 PULMONOLOGY         Date: 10/25/2023,   MRN# [id number] Catherine Giles Feb 25, 1971     AdmissionWeight: 78.9 kg                 CurrentWeight: 78.9 kg  Referring provider: Dr Laurita   CHIEF COMPLAINT:   Abnormal CT chest  HISTORY OF PRESENT ILLNESS   This is a 53 yo with history of aRA, COPD lifelong smoking since age 34 smoking 1ppd, Asthma/COPD, SLE, hypothyroidism.  She reports feeling severe fatigue and overal malaise over past 3 months.  She reports subjective fevers.  She had CTPE done with no PE noted but did have mass like consolidations.  PCCM consultation was placed for further evaluation.  10/24/23- patient seen at bedside, she is on 1-2L/min.  Her lung auscultation is improved today.  She has multiple tests still in process but thus far erverything is negative.  She is asking for nicotine  patch. Have ordered this today. Sputum cultures collected in process. RVP is negative.  10/25/23- patient stable , getting better but continues to report fatigue and cough, is on room air and can be discharged home when optizmied from TRH perspective.   PAST MEDICAL HISTORY   Past Medical History:  Diagnosis Date  . Arthritis    RA  . Asthma   . COPD (chronic obstructive pulmonary disease) (HCC)   . Depression   . Diabetes mellitus without complication (HCC)   . Emphysema of lung (HCC)   . Family history of breast cancer    6/21 cancer genetic testing letter sent  . Fibromyalgia   . Hypertension   . Lupus   . Pre-diabetes   . Thyroid  disease      SURGICAL HISTORY   Past Surgical History:  Procedure Laterality Date  . CHOLECYSTECTOMY, LAPAROSCOPIC  02/2022  . MOUTH SURGERY    . TRANSTHORACIC ECHOCARDIOGRAM  04/2020   EF 60 to 65%.  Normal LV function.  No R WMA.  GR 1 DD.  Normal RV size and function.  Normal PAP.  Normal atrial size.  Normal valves.     FAMILY HISTORY   Family History  Problem Relation Age of Onset  . Lung cancer Mother   . Diabetes  Mother   . Breast cancer Mother 50  . Hypertension Father   . Cancer Maternal Aunt   . Ovarian cancer Maternal Aunt   . Diabetes Maternal Aunt   . Diabetes Maternal Uncle   . Hypertension Maternal Grandmother   . Diabetes Maternal Grandmother      SOCIAL HISTORY   Social History   Tobacco Use  . Smoking status: Every Day    Current packs/day: 1.00    Average packs/day: 1 pack/day for 34.0 years (34.0 ttl pk-yrs)    Types: Cigarettes  . Smokeless tobacco: Never  . Tobacco comments:    2-3 cig/day  Vaping Use  . Vaping status: Never Used  Substance Use Topics  . Alcohol use: Yes    Comment: Occ  . Drug use: Not Currently     MEDICATIONS    Home Medication:     Current Medication:  Current Facility-Administered Medications:  .  acetaminophen  (TYLENOL ) tablet 650 mg, 650 mg, Oral, Q6H PRN **OR** acetaminophen  (TYLENOL ) suppository 650 mg, 650 mg, Rectal, Q6H PRN, Laurita Manor T, MD .  albuterol  (PROVENTIL ) (2.5 MG/3ML) 0.083% nebulizer solution 2.5 mg, 2.5 mg, Nebulization, Q6H PRN, Belue, Rankin RAMAN, RPH .  amLODipine  (NORVASC ) tablet  5 mg, 5 mg, Oral, Daily, Laurita Manor T, MD, 5 mg at 10/25/23 0907 .  benzonatate  (TESSALON ) capsule 100 mg, 100 mg, Oral, TID PRN, Zhang, Ping T, MD, 100 mg at 10/24/23 1000 .  cyclobenzaprine  (FLEXERIL ) tablet 5 mg, 5 mg, Oral, QHS, Laurita Manor T, MD, 5 mg at 10/24/23 2114 .  dapagliflozin  propanediol (FARXIGA ) tablet 10 mg, 10 mg, Oral, QAC breakfast, Ayiku, Bernard, MD, 10 mg at 10/25/23 0908 .  docusate sodium  (COLACE) capsule 100 mg, 100 mg, Oral, BID, Ayiku, Bernard, MD, 100 mg at 10/25/23 0907 .  DULoxetine  (CYMBALTA ) DR capsule 60 mg, 60 mg, Oral, Daily, Ayiku, Bernard, MD, 60 mg at 10/25/23 0906 .  fluticasone  furoate-vilanterol (BREO ELLIPTA ) 200-25 MCG/ACT 1 puff, 1 puff, Inhalation, Daily, 1 puff at 10/24/23 0954 **AND** umeclidinium bromide  (INCRUSE ELLIPTA ) 62.5 MCG/ACT 1 puff, 1 puff, Inhalation, Daily, Laurita Manor T, MD, 1  puff at 10/24/23 [phone] .  fondaparinux  (ARIXTRA ) injection 2.5 mg, 2.5 mg, Subcutaneous, Q24H, Zhang, Ping T, MD .  hydrALAZINE  (APRESOLINE ) injection 5 mg, 5 mg, Intravenous, Q6H PRN, Zhang, Ping T, MD .  hydrOXYzine  (ATARAX ) tablet 25 mg, 25 mg, Oral, TID PRN, Zhang, Ping T, MD .  insulin  aspart (novoLOG ) injection 0-15 Units, 0-15 Units, Subcutaneous, TID WC, Jesus America, NP .  ipratropium (ATROVENT ) 0.06 % nasal spray 2 spray, 2 spray, Each Nare, QID, Laurita Manor T, MD, 2 spray at 10/24/23 1432 .  ketorolac  (TORADOL ) 15 MG/ML injection 15 mg, 15 mg, Intravenous, Q6H PRN, Zhang, Ping T, MD, 15 mg at 10/24/23 1000 .  levofloxacin  (LEVAQUIN ) IVPB 750 mg, 750 mg, Intravenous, Q24H, Laurita Manor T, MD, Last Rate: 100 mL/hr at 10/25/23 0529, 750 mg at 10/25/23 0529 .  levothyroxine  (SYNTHROID ) tablet 50 mcg, 50 mcg, Oral, Q0600, Laurita Manor T, MD, 50 mcg at 10/25/23 0530 .  loratadine  (CLARITIN ) tablet 10 mg, 10 mg, Oral, Daily, Laurita Manor T, MD, 10 mg at 10/25/23 9092 .  metoprolol  tartrate (LOPRESSOR ) injection 5 mg, 5 mg, Intravenous, Once, Laurita Manor T, MD .  montelukast  (SINGULAIR ) tablet 10 mg, 10 mg, Oral, Daily, Laurita Manor T, MD, 10 mg at 10/25/23 0906 .  morphine  (PF) 4 MG/ML injection 4 mg, 4 mg, Intravenous, Q4H PRN, Jens Durand, MD .  nicotine  (NICODERM CQ  - dosed in mg/24 hours) patch 14 mg, 14 mg, Transdermal, Daily, Liyana Suniga, MD, 14 mg at 10/24/23 1432 .  ondansetron  (ZOFRAN ) tablet 4 mg, 4 mg, Oral, Q6H PRN **OR** ondansetron  (ZOFRAN ) injection 4 mg, 4 mg, Intravenous, Q6H PRN, Laurita Manor T, MD .  oxyCODONE  (Oxy IR/ROXICODONE ) immediate release tablet 5 mg, 5 mg, Oral, Q4H PRN, Laurita Manor T, MD, 5 mg at 10/25/23 9347 .  predniSONE  (DELTASONE ) tablet 40 mg, 40 mg, Oral, Q breakfast, Laurita Manor T, MD, 40 mg at 10/24/23 9047    ALLERGIES   Azithromycin, Molds & smuts, Alpha-gal, Penicillins, and Shellfish allergy     REVIEW OF SYSTEMS    Review of  Systems:  Gen:  Denies  fever, sweats, chills weigh loss  HEENT: Denies blurred vision, double vision, ear pain, eye pain, hearing loss, nose bleeds, sore throat Cardiac:  No dizziness, chest pain or heaviness, chest tightness,edema Resp:   reports dyspnea chronically  Gi: Denies swallowing difficulty, stomach pain, nausea or vomiting, diarrhea, constipation, bowel incontinence Gu:  Denies bladder incontinence, burning urine Ext:   Denies Joint pain, stiffness or swelling Skin: Denies  skin rash, easy bruising or bleeding or hives Endoc:  Denies polyuria, polydipsia , polyphagia or weight change Psych:   Denies depression, insomnia or hallucinations   Other:  All other systems negative   VS: BP (!) 166/92   Pulse 92   Temp 98.1 F (36.7 C) (Oral)   Resp 16   Ht 5' 3 (1.6 m)   Wt 78.9 kg   SpO2 95%   BMI 30.82 kg/m      PHYSICAL EXAM    GENERAL:NAD, no fevers, chills, no weakness no fatigue HEAD: Normocephalic, atraumatic.  EYES: Pupils equal, round, reactive to light. Extraocular muscles intact. No scleral icterus.  MOUTH: Moist mucosal membrane. Dentition intact. No abscess noted.  EAR, NOSE, THROAT: Clear without exudates. No external lesions.  NECK: Supple. No thyromegaly. No nodules. No JVD.  PULMONARY: decreased breath sounds with mild rhonchi worse at bases bilaterally.  CARDIOVASCULAR: S1 and S2. Regular rate and rhythm. No murmurs, rubs, or gallops. No edema. Pedal pulses 2+ bilaterally.  GASTROINTESTINAL: Soft, nontender, nondistended. No masses. Positive bowel sounds. No hepatosplenomegaly.  MUSCULOSKELETAL: No swelling, clubbing, or edema. Range of motion full in all extremities.  NEUROLOGIC: Cranial nerves II through XII are intact. No gross focal neurological deficits. Sensation intact. Reflexes intact.  SKIN: No ulceration, lesions, rashes, or cyanosis. Skin warm and dry. Turgor intact.  PSYCHIATRIC: Mood, affect within normal limits. The patient is awake,  alert and oriented x 3. Insight, judgment intact.       IMAGING     ASSESSMENT/PLAN   Multiple pulmonary nodular opacities    Patient has history of RA and may have RA related lung nodules , there is also higher susceptibility to opportunistic infections. She takes Breo ellipta  Patient also has significant smoking history with overall pre-test probability of lung cancer at moderate risk -will order serology for beta d glucan , cryptococcal antigen, aspergillus -patient is currently on antibiotics with levofloxacin . -patient empirically started on prednisone  for COPD with exacerbation and is on nebulizer therapy -respiratory cultures today -COVID/FLU/RSV negative -will continue to follow        Thank you for allowing me to participate in the care of this patient.   Patient/Family are satisfied with care plan and all questions have been answered.    Provider disclosure: Patient with at least one acute or chronic illness or injury that poses a threat to life or bodily function and is being managed actively during this encounter.  All of the below services have been performed independently by signing provider:  review of prior documentation from internal and or external health records.  Review of previous and current lab results.  Interview and comprehensive assessment during patient visit today. Review of current and previous chest radiographs/CT scans. Discussion of management and test interpretation with health care team and patient/family.   This document was prepared using Dragon voice recognition software and may include unintentional dictation errors.     Cynde Menard, M.D.  Division of Pulmonary & Critical Care Medicine

## 2023-10-26 DIAGNOSIS — A419 Sepsis, unspecified organism: Secondary | ICD-10-CM | POA: Diagnosis not present

## 2023-10-26 DIAGNOSIS — J189 Pneumonia, unspecified organism: Secondary | ICD-10-CM | POA: Diagnosis not present

## 2023-10-26 LAB — CBC WITH DIFFERENTIAL/PLATELET
Abs Immature Granulocytes: 0.04 10*3/uL (ref 0.00–0.07)
Basophils Absolute: 0 10*3/uL (ref 0.0–0.1)
Basophils Relative: 0 %
Eosinophils Absolute: 0 10*3/uL (ref 0.0–0.5)
Eosinophils Relative: 0 %
HCT: 41.6 % (ref 36.0–46.0)
Hemoglobin: 13.7 g/dL (ref 12.0–15.0)
Immature Granulocytes: 0 %
Lymphocytes Relative: 45 %
Lymphs Abs: 5 10*3/uL — ABNORMAL HIGH (ref 0.7–4.0)
MCH: 30 pg (ref 26.0–34.0)
MCHC: 32.9 g/dL (ref 30.0–36.0)
MCV: 91 fL (ref 80.0–100.0)
Monocytes Absolute: 0.7 10*3/uL (ref 0.1–1.0)
Monocytes Relative: 6 %
Neutro Abs: 5.5 10*3/uL (ref 1.7–7.7)
Neutrophils Relative %: 49 %
Platelets: 415 10*3/uL — ABNORMAL HIGH (ref 150–400)
RBC: 4.57 MIL/uL (ref 3.87–5.11)
RDW: 14.4 % (ref 11.5–15.5)
WBC: 11.3 10*3/uL — ABNORMAL HIGH (ref 4.0–10.5)
nRBC: 0.2 % (ref 0.0–0.2)

## 2023-10-26 LAB — BASIC METABOLIC PANEL
Anion gap: 11 (ref 5–15)
BUN: 20 mg/dL (ref 6–20)
CO2: 25 mmol/L (ref 22–32)
Calcium: 9.3 mg/dL (ref 8.9–10.3)
Chloride: 102 mmol/L (ref 98–111)
Creatinine, Ser: 0.51 mg/dL (ref 0.44–1.00)
GFR, Estimated: >60 mL/min (ref >60)
Glucose, Bld: 122 mg/dL — ABNORMAL HIGH (ref 70–99)
Potassium: 3.9 mmol/L (ref 3.5–5.1)
Sodium: 138 mmol/L (ref 135–145)

## 2023-10-26 LAB — MYCOPLASMA PNEUMONIAE ANTIBODY, IGM: Mycoplasma pneumo IgM: <770 U/mL (ref 0–769)

## 2023-10-26 LAB — MAGNESIUM: Magnesium: 2.2 mg/dL (ref 1.7–2.4)

## 2023-10-26 LAB — CRYPTOCOCCUS ANTIGEN, SERUM: Cryptococcus Antigen, Serum: NEGATIVE

## 2023-10-26 LAB — GLUCOSE, CAPILLARY: Glucose-Capillary: 123 mg/dL — ABNORMAL HIGH (ref 70–99)

## 2023-10-26 MED ORDER — PREDNISONE 10 MG PO TABS: 10.0000 mg | ORAL_TABLET | Freq: Every day | ORAL | 0 refills | Status: AC

## 2023-10-26 MED ORDER — LEVOFLOXACIN 750 MG PO TABS: 750.0000 mg | ORAL_TABLET | Freq: Every day | ORAL | Status: AC

## 2023-10-26 MED ORDER — CETIRIZINE HCL 10 MG PO TABS
10.0000 mg | ORAL_TABLET | Freq: Every day | ORAL | Status: AC | PRN
Start: 2023-10-26 — End: ?

## 2023-10-26 NOTE — Care Management Important Message (Signed)
 Important Message  Patient Details  Name: Catherine Giles MRN: [id number] Date of Birth: 19-May-1971   Important Message Given:  Yes - Medicare IM     Cristela Blue, CMA 10/26/2023, 9:57 AM

## 2023-10-26 NOTE — Progress Notes (Signed)
 PULMONOLOGY         Date: 10/26/2023,   MRN# [id number] Catherine Giles [birth year]     AdmissionWeight: 78.9 kg                 CurrentWeight: 78.9 kg  Referring provider: Dr Laurita   CHIEF COMPLAINT:   Abnormal CT chest  HISTORY OF PRESENT ILLNESS   This is a 53 yo with history of aRA, COPD lifelong smoking since age 46 smoking 1ppd, Asthma/COPD, SLE, hypothyroidism.  She reports feeling severe fatigue and overal malaise over past 3 months.  She reports subjective fevers.  She had CTPE done with no PE noted but did have mass like consolidations.  PCCM consultation was placed for further evaluation.  10/24/23- patient seen at bedside, she is on 1-2L/min.  Her lung auscultation is improved today.  She has multiple tests still in process but thus far erverything is negative.  She is asking for nicotine  patch. Have ordered this today. Sputum cultures collected in process. RVP is negative.  10/25/23- patient stable , getting better but continues to report fatigue and cough, is on room air and can be discharged home when optizmied from TRH perspective.  10/26/23- patient optimized for dc home , have reviewed outpatient follow up instructions for possible lung biopsy  PAST MEDICAL HISTORY   Past Medical History:  Diagnosis Date  . Arthritis    RA  . Asthma   . COPD (chronic obstructive pulmonary disease) (HCC)   . Depression   . Diabetes mellitus without complication (HCC)   . Emphysema of lung (HCC)   . Family history of breast cancer    6/21 cancer genetic testing letter sent  . Fibromyalgia   . Hypertension   . Lupus   . Pre-diabetes   . Thyroid  disease      SURGICAL HISTORY   Past Surgical History:  Procedure Laterality Date  . CHOLECYSTECTOMY, LAPAROSCOPIC  02/2022  . MOUTH SURGERY    . TRANSTHORACIC ECHOCARDIOGRAM  04/2020   EF 60 to 65%.  Normal LV function.  No R WMA.  GR 1 DD.  Normal RV size and function.  Normal PAP.  Normal atrial size.  Normal  valves.     FAMILY HISTORY   Family History  Problem Relation Age of Onset  . Lung cancer Mother   . Diabetes Mother   . Breast cancer Mother 66  . Hypertension Father   . Cancer Maternal Aunt   . Ovarian cancer Maternal Aunt   . Diabetes Maternal Aunt   . Diabetes Maternal Uncle   . Hypertension Maternal Grandmother   . Diabetes Maternal Grandmother      SOCIAL HISTORY   Social History   Tobacco Use  . Smoking status: Every Day    Current packs/day: 1.00    Average packs/day: 1 pack/day for 34.0 years (34.0 ttl pk-yrs)    Types: Cigarettes  . Smokeless tobacco: Never  . Tobacco comments:    2-3 cig/day  Vaping Use  . Vaping status: Never Used  Substance Use Topics  . Alcohol use: Yes    Comment: Occ  . Drug use: Not Currently     MEDICATIONS    Home Medication:     Current Medication:  Current Facility-Administered Medications:  .  acetaminophen  (TYLENOL ) tablet 650 mg, 650 mg, Oral, Q6H PRN **OR** acetaminophen  (TYLENOL ) suppository 650 mg, 650 mg, Rectal, Q6H PRN, Laurita Manor T, MD .  albuterol  (PROVENTIL ) (2.5 MG/3ML) 0.083% nebulizer  solution 2.5 mg, 2.5 mg, Nebulization, Q6H PRN, Dail Rankin RAMAN, RPH .  amLODipine  (NORVASC ) tablet 5 mg, 5 mg, Oral, Daily, Laurita Manor T, MD, 5 mg at 10/25/23 0907 .  atorvastatin  (LIPITOR) tablet 40 mg, 40 mg, Oral, Daily, Ayiku, Bernard, MD, 40 mg at 10/25/23 1245 .  benzonatate  (TESSALON ) capsule 100 mg, 100 mg, Oral, TID PRN, Zhang, Ping T, MD, 100 mg at 10/24/23 1000 .  cyclobenzaprine  (FLEXERIL ) tablet 5 mg, 5 mg, Oral, QHS, Laurita Manor T, MD, 5 mg at 10/25/23 2139 .  dapagliflozin  propanediol (FARXIGA ) tablet 10 mg, 10 mg, Oral, QAC breakfast, Ayiku, Bernard, MD, 10 mg at 10/25/23 0908 .  docusate sodium  (COLACE) capsule 100 mg, 100 mg, Oral, BID, Ayiku, Bernard, MD, 100 mg at 10/25/23 2140 .  DULoxetine  (CYMBALTA ) DR capsule 60 mg, 60 mg, Oral, Daily, Ayiku, Bernard, MD, 60 mg at 10/25/23 0906 .  fluticasone   furoate-vilanterol (BREO ELLIPTA ) 200-25 MCG/ACT 1 puff, 1 puff, Inhalation, Daily, 1 puff at 10/25/23 0915 **AND** umeclidinium bromide  (INCRUSE ELLIPTA ) 62.5 MCG/ACT 1 puff, 1 puff, Inhalation, Daily, Laurita Manor T, MD, 1 puff at 10/25/23 0914 .  hydrALAZINE  (APRESOLINE ) injection 5 mg, 5 mg, Intravenous, Q6H PRN, Laurita Manor T, MD .  hydrOXYzine  (ATARAX ) tablet 25 mg, 25 mg, Oral, TID PRN, Zhang, Ping T, MD .  insulin  aspart (novoLOG ) injection 0-15 Units, 0-15 Units, Subcutaneous, TID WC, Morrison, Brenda, NP, 3 Units at 10/25/23 1847 .  ipratropium (ATROVENT ) 0.06 % nasal spray 2 spray, 2 spray, Each Nare, QID, Laurita Manor T, MD, 2 spray at 10/24/23 1432 .  ketorolac  (TORADOL ) 15 MG/ML injection 15 mg, 15 mg, Intravenous, Q6H PRN, Zhang, Ping T, MD, 15 mg at 10/25/23 2137 .  levofloxacin  (LEVAQUIN ) IVPB 750 mg, 750 mg, Intravenous, Q24H, Laurita Manor DASEN, MD, Stopped at 10/26/23 0545 .  levothyroxine  (SYNTHROID ) tablet 50 mcg, 50 mcg, Oral, Q0600, Laurita Manor DASEN, MD, 50 mcg at 10/26/23 0505 .  loratadine  (CLARITIN ) tablet 10 mg, 10 mg, Oral, Daily, Laurita Manor T, MD, 10 mg at 10/25/23 0907 .  losartan  (COZAAR ) tablet 100 mg, 100 mg, Oral, Daily, Ayiku, Bernard, MD, 100 mg at 10/25/23 1245 .  metoprolol  succinate (TOPROL -XL) 24 hr tablet 25 mg, 25 mg, Oral, Daily, Ayiku, Bernard, MD, 25 mg at 10/25/23 1245 .  metoprolol  tartrate (LOPRESSOR ) injection 5 mg, 5 mg, Intravenous, Once, Laurita Manor T, MD .  montelukast  (SINGULAIR ) tablet 10 mg, 10 mg, Oral, Daily, Laurita Manor T, MD, 10 mg at 10/25/23 0906 .  nicotine  (NICODERM CQ  - dosed in mg/24 hours) patch 14 mg, 14 mg, Transdermal, Daily, Aaliyah Gavel, MD, 14 mg at 10/25/23 0913 .  ondansetron  (ZOFRAN ) tablet 4 mg, 4 mg, Oral, Q6H PRN **OR** ondansetron  (ZOFRAN ) injection 4 mg, 4 mg, Intravenous, Q6H PRN, Laurita Manor T, MD .  oxyCODONE  (Oxy IR/ROXICODONE ) immediate release tablet 5 mg, 5 mg, Oral, Q4H PRN, Laurita Manor T, MD, 5 mg at 10/26/23 0504 .   predniSONE  (DELTASONE ) tablet 40 mg, 40 mg, Oral, Q breakfast, Laurita Manor T, MD, 40 mg at 10/25/23 1244    ALLERGIES   Azithromycin, Molds & smuts, Alpha-gal, Penicillins, and Shellfish allergy     REVIEW OF SYSTEMS    Review of Systems:  Gen:  Denies  fever, sweats, chills weigh loss  HEENT: Denies blurred vision, double vision, ear pain, eye pain, hearing loss, nose bleeds, sore throat Cardiac:  No dizziness, chest pain or heaviness, chest tightness,edema Resp:   reports dyspnea chronically  Gi: Denies swallowing difficulty, stomach pain, nausea or vomiting, diarrhea, constipation, bowel incontinence Gu:  Denies bladder incontinence, burning urine Ext:   Denies Joint pain, stiffness or swelling Skin: Denies  skin rash, easy bruising or bleeding or hives Endoc:  Denies polyuria, polydipsia , polyphagia or weight change Psych:   Denies depression, insomnia or hallucinations   Other:  All other systems negative   VS: BP (!) 145/84 (BP Location: Right Arm)   Pulse 74   Temp 97.9 F (36.6 C)   Resp 18   Ht 5' 3 (1.6 m)   Wt 78.9 kg   SpO2 94%   BMI 30.82 kg/m      PHYSICAL EXAM    GENERAL:NAD, no fevers, chills, no weakness no fatigue HEAD: Normocephalic, atraumatic.  EYES: Pupils equal, round, reactive to light. Extraocular muscles intact. No scleral icterus.  MOUTH: Moist mucosal membrane. Dentition intact. No abscess noted.  EAR, NOSE, THROAT: Clear without exudates. No external lesions.  NECK: Supple. No thyromegaly. No nodules. No JVD.  PULMONARY: decreased breath sounds with mild rhonchi worse at bases bilaterally.  CARDIOVASCULAR: S1 and S2. Regular rate and rhythm. No murmurs, rubs, or gallops. No edema. Pedal pulses 2+ bilaterally.  GASTROINTESTINAL: Soft, nontender, nondistended. No masses. Positive bowel sounds. No hepatosplenomegaly.  MUSCULOSKELETAL: No swelling, clubbing, or edema. Range of motion full in all extremities.  NEUROLOGIC: Cranial  nerves II through XII are intact. No gross focal neurological deficits. Sensation intact. Reflexes intact.  SKIN: No ulceration, lesions, rashes, or cyanosis. Skin warm and dry. Turgor intact.  PSYCHIATRIC: Mood, affect within normal limits. The patient is awake, alert and oriented x 3. Insight, judgment intact.       IMAGING     ASSESSMENT/PLAN   Multiple pulmonary nodular opacities    Patient has history of RA and may have RA related lung nodules , there is also higher susceptibility to opportunistic infections. She takes Breo ellipta  Patient also has significant smoking history with overall pre-test probability of lung cancer at moderate risk -will order serology for beta d glucan , cryptococcal antigen, aspergillus -patient is currently on antibiotics with levofloxacin . -patient empirically started on prednisone  for COPD with exacerbation and is on nebulizer therapy -respiratory cultures today -COVID/FLU/RSV negative -will continue to follow        Thank you for allowing me to participate in the care of this patient.   Patient/Family are satisfied with care plan and all questions have been answered.    Provider disclosure: Patient with at least one acute or chronic illness or injury that poses a threat to life or bodily function and is being managed actively during this encounter.  All of the below services have been performed independently by signing provider:  review of prior documentation from internal and or external health records.  Review of previous and current lab results.  Interview and comprehensive assessment during patient visit today. Review of current and previous chest radiographs/CT scans. Discussion of management and test interpretation with health care team and patient/family.   This document was prepared using Dragon voice recognition software and may include unintentional dictation errors.     Keshan Reha, M.D.  Division of Pulmonary & Critical Care  Medicine

## 2023-10-26 NOTE — Discharge Summary (Signed)
 Physician Discharge Summary   Patient: Khilee Hendricksen MRN: [id number] DOB: 04-Jun-1971  Admit date:     10/23/2023  Discharge date: 10/26/23  Discharge Physician: AIDA CHO   PCP: [facility]   Recommendations at discharge:   Follow-up with PCP in 1 week Follow-up with pulmonologist in rheumatologist as an outpatient as soon as possible  Discharge Diagnoses: Principal Problem:   Sepsis due to pneumonia [facility]) Active Problems:   Pneumonia  Resolved Problems:   * No resolved hospital problems. *  Hospital Course:  Chamille Tessia Kassin is a 53 y.o. female  with medical history significant for RA on DMARDs, asthma/COPD Gold stage II, HTN, lupus, hypothyroidism, presented with worsening of cough, chest pain, fever.    She was admitted to the hospital for sepsis secondary to multifocal pneumonia and COPD exacerbation.   Assessment and Plan:  Severe sepsis secondary to multifocal pneumonia in an immunocompromised patient: Her condition has improved significantly.  She will complete course of Levaquin  at home.  She says she already has Levaquin  tablets at home.  No growth on blood cultures.  Cryptococcus antigen negative. Fungitell, Aspergillus, mycoplasma and Legionella test are pending at the time of discharge.  These tests will be followed by pulmonologist as an outpatient.. Lactic acidosis has resolved.  Lactic acid was 2.4 on admission. Leukocytosis is improving     COPD exacerbation: Improved.  Continue bronchodilators. She says she was taking prednisone  10 mg daily for rheumatoid arthritis prior to admission but she had ran out of prednisone .  She will be discharged on prednisone  10 mg daily.  She has been advised to follow-up with a rheumatologist or PCP for refills for her rheumatoid arthritis Chronic hypoxic respiratory failure: Continue 2 L/min oxygen via nasal cannula.     Severe pleuritic chest pain: Improved.   CTA negative for acute  pulmonary embolism.     Bilateral lung opacities: Largest on the right is ateral to the hilum measuring 4 cm and left lingular nodule measuring 3 cm: Outpatient follow-up with CT chest recommended in 3 months.     Hypokalemia: Improved.     Type II DM with hyperglycemia: Continue Farxiga .  Hemoglobin A1c is 6.8.    Comorbidities include hypertension, rheumatoid arthritis (s/p abatacept / Orencia infusion on 09/04/2023), hypothyroidism, tobacco use disorder, depression (prefers to continue with Cymbalta )  Her condition has improved and she is deemed stable for discharge to home today.      Consultants: Pulmonologist Procedures performed: None Disposition: Home Diet recommendation:  Discharge Diet Orders (From admission, onward)     Start     Ordered   10/26/23 0000  Diet - low sodium heart healthy        10/26/23 1007   10/26/23 0000  Diet Carb Modified        10/26/23 1007           Cardiac and Carb modified diet DISCHARGE MEDICATION: Allergies as of 10/26/2023       Reactions   Azithromycin Anaphylaxis   Molds & Smuts Cough   Alpha-gal Other (See Comments)   Penicillins    Did it involve swelling of the face/tongue/throat, SOB, or low BP? Yes Did it involve sudden or severe rash/hives, skin peeling, or any reaction on the inside of your mouth or nose? No Did you need to seek medical attention at a hospital or doctor's office? No When did it last happen?       If all above answers are "NO",  may proceed with cephalosporin use.   Shellfish Allergy         Medication List     STOP taking these medications    isosorbide mononitrate 30 MG 24 hr tablet Commonly known as: IMDUR       TAKE these medications    albuterol  108 (90 Base) MCG/ACT inhaler Commonly known as: VENTOLIN  HFA Inhale 1-2 puffs into the lungs every 6 (six) hours as needed for shortness of breath or wheezing.   amLODipine  10 MG tablet Commonly known as: NORVASC  Take 1 tablet (10 mg  total) by mouth daily.   atorvastatin  40 MG tablet Commonly known as: LIPITOR Take 40 mg by mouth daily.   cetirizine  10 MG tablet Commonly known as: ZYRTEC  Take 1 tablet (10 mg total) by mouth daily as needed for rhinitis or allergies.   dapagliflozin  propanediol 10 MG Tabs tablet Commonly known as: Farxiga  Take 1 tablet (10 mg total) by mouth daily before breakfast.   DULoxetine  60 MG capsule Commonly known as: CYMBALTA  Take 60 mg by mouth daily.   EPINEPHrine  0.3 mg/0.3 mL Soaj injection Commonly known as: EPI-PEN Inject 0.3 mg into the muscle as needed for anaphylaxis.   glucose blood test strip Use to check blood sugar 3 times daily with goal <130 fasting and <180 two hours after meal.   hydrOXYzine  25 MG capsule Commonly known as: VISTARIL  TAKE 1 CAPSULE BY MOUTH EVERY 8 HOURS ASNEEDED What changed:  how much to take how to take this when to take this reasons to take this   levofloxacin  750 MG tablet Commonly known as: Levaquin  Take 1 tablet (750 mg total) by mouth daily for 1 day. Start taking on: October 27, 2023   levothyroxine  50 MCG tablet Commonly known as: SYNTHROID  Take 50 mcg by mouth daily before breakfast.   losartan  100 MG tablet Commonly known as: COZAAR  Take 100 mg by mouth daily.   metoprolol  succinate 25 MG 24 hr tablet Commonly known as: TOPROL -XL Take 1 tablet by mouth daily.   montelukast  10 MG tablet Commonly known as: SINGULAIR  Take 10 mg by mouth daily.   omeprazole 20 MG capsule Commonly known as: PRILOSEC Take 20 mg by mouth 2 (two) times daily.   onetouch ultrasoft lancets Use to check blood sugar 3 times daily with goal <130 fasting and <180 two hours after meal.   onetouch ultrasoft lancets Use as directed   OneTouch Verio w/Device Kit Use to check blood sugar 3 times daily with goal <130 fasting and <180 two hours after meal.  Bring meter to visits.   potassium chloride  10 MEQ CR capsule Commonly known as:  MICRO-K  Take 10 mEq by mouth daily.   predniSONE  10 MG tablet Commonly known as: DELTASONE  Take 1 tablet (10 mg total) by mouth daily with breakfast for 10 days. Start taking on: October 27, 2023   Trelegy Ellipta 200-62.5-25 MCG/ACT Aepb Generic drug: Fluticasone -Umeclidin-Vilant Inhale 1 puff into the lungs daily.   True Vitamin D3 10 MCG (400 UNIT) Caps Generic drug: Cholecalciferol  Take 2 capsules by mouth daily.        Discharge Exam: Filed Weights   10/22/23 2241  Weight: 78.9 kg   GEN: NAD SKIN: Warm and dry EYES: No pallor or icterus ENT: MMM CV: RRR PULM: CTA B ABD: soft, obese, NT, +BS CNS: AAO x 3, non focal EXT: No edema or tenderness   Condition at discharge: good  The results of significant diagnostics from this hospitalization (including imaging, microbiology,  ancillary and laboratory) are listed below for reference.   Imaging Studies: CT Angio Chest PE W and/or Wo Contrast Result Date: 10/23/2023 CLINICAL DATA:  Pulmonary embolism suspected. Shortness of breath with dizziness EXAM: CT ANGIOGRAPHY CHEST WITH CONTRAST TECHNIQUE: Multidetector CT imaging of the chest was performed using the standard protocol during bolus administration of intravenous contrast. Multiplanar CT image reconstructions and MIPs were obtained to evaluate the vascular anatomy. RADIATION DOSE REDUCTION: This exam was performed according to the departmental dose-optimization program which includes automated exposure control, adjustment of the mA and/or kV according to patient size and/or use of iterative reconstruction technique. CONTRAST:  OMNIPAQUE  IOHEXOL  350 MG/ML SOLN COMPARISON:  08/14/2023 FINDINGS: Cardiovascular: Satisfactory opacification of the pulmonary arteries to the segmental level. No evidence of pulmonary embolism. Normal heart size. No pericardial effusion. Mediastinum/Nodes: Chronic retrosternal nodule measuring 2.6 cm, thyroid  density on precontrast imaging from  previous year, still most consistent with retrosternal goiter. There is thickening of hilar lymph nodes not seen previously, considered reactive. Lungs/Pleura: Chronic lung disease with airway thickening and mosaic attenuation of the lungs diffusely. Bands of scarring and atelectasis bilaterally. There are new perihilar ill-defined masslike opacities which are multifocal and bilateral affecting upper and lower lobes. The largest on the right is lateral to the hilum measuring 4 cm on 6:70. On the left a lingular nodule measures 3 cm. No edema, effusion, or pneumothorax. Upper Abdomen: No acute finding Musculoskeletal: No acute finding Review of the MIP images confirms the above findings. IMPRESSION: Nodular and masslike consolidation in the bilateral perihilar lung which is multifocal. Rapid development since 08/14/2023 is most consistent with infectious/inflammatory process, consider atypical pathogens given the pattern. Recommend chest CT follow-up in 3 months. Negative for pulmonary embolism. Electronically Signed   By: Dorn Roulette M.D.   On: 10/23/2023 04:13   DG Chest 2 View Result Date: 10/23/2023 CLINICAL DATA:  Chest pain and shortness of breath. EXAM: CHEST - 2 VIEW COMPARISON:  August 14, 2023 FINDINGS: The heart size and mediastinal contours are within normal limits. Stable, chronic areas of scarring and/or atelectasis are seen within the bilateral lung bases and mid lung fields. Small, ill-defined focal opacities are seen overlying the bilateral perihilar regions. This represents a new finding when compared to the prior study. No pleural effusion or pneumothorax is identified. The visualized skeletal structures are unremarkable. IMPRESSION: 1. Chronic bibasilar and mid lung field scarring and/or atelectasis. 2. Small, ill-defined focal opacities overlying the bilateral perihilar regions which may represent focal infiltrates. Further evaluation with chest CT is recommended to exclude the presence  of an underlying neoplastic process. Electronically Signed   By: Suzen Dials M.D.   On: 10/23/2023 00:28    Microbiology: Results for orders placed or performed during the hospital encounter of 10/23/23  Resp panel by RT-PCR (RSV, Flu A&B, Covid) Anterior Nasal Swab     Status: None   Collection Time: 10/22/23 10:46 PM   Specimen: Anterior Nasal Swab  Result Value Ref Range Status   SARS Coronavirus 2 by RT PCR NEGATIVE NEGATIVE Final    Comment: (NOTE) SARS-CoV-2 target nucleic acids are NOT DETECTED.  The SARS-CoV-2 RNA is generally detectable in upper respiratory specimens during the acute phase of infection. The lowest concentration of SARS-CoV-2 viral copies this assay can detect is 138 copies/mL. A negative result does not preclude SARS-Cov-2 infection and should not be used as the sole basis for treatment or other patient management decisions. A negative result may occur with  improper  specimen collection/handling, submission of specimen other than nasopharyngeal swab, presence of viral mutation(s) within the areas targeted by this assay, and inadequate number of viral copies(<138 copies/mL). A negative result must be combined with clinical observations, patient history, and epidemiological information. The expected result is Negative.  Fact Sheet for Patients:  bloggercourse.com  Fact Sheet for Healthcare Providers:  seriousbroker.it  This test is no t yet approved or cleared by the [place]  FDA and  has been authorized for detection and/or diagnosis of SARS-CoV-2 by FDA under an Emergency Use Authorization (EUA). This EUA will remain  in effect (meaning this test can be used) for the duration of the COVID-19 declaration under Section 564(b)(1) of the Act, 21 U.S.C.section 360bbb-3(b)(1), unless the authorization is terminated  or revoked sooner.       Influenza A by PCR NEGATIVE NEGATIVE Final   Influenza  B by PCR NEGATIVE NEGATIVE Final    Comment: (NOTE) The Xpert Xpress SARS-CoV-2/FLU/RSV plus assay is intended as an aid in the diagnosis of influenza from Nasopharyngeal swab specimens and should not be used as a sole basis for treatment. Nasal washings and aspirates are unacceptable for Xpert Xpress SARS-CoV-2/FLU/RSV testing.  Fact Sheet for Patients: bloggercourse.com  Fact Sheet for Healthcare Providers: seriousbroker.it  This test is not yet approved or cleared by the [place]  FDA and has been authorized for detection and/or diagnosis of SARS-CoV-2 by FDA under an Emergency Use Authorization (EUA). This EUA will remain in effect (meaning this test can be used) for the duration of the COVID-19 declaration under Section 564(b)(1) of the Act, 21 U.S.C. section 360bbb-3(b)(1), unless the authorization is terminated or revoked.     Resp Syncytial Virus by PCR NEGATIVE NEGATIVE Final    Comment: (NOTE) Fact Sheet for Patients: bloggercourse.com  Fact Sheet for Healthcare Providers: seriousbroker.it  This test is not yet approved or cleared by the [place]  FDA and has been authorized for detection and/or diagnosis of SARS-CoV-2 by FDA under an Emergency Use Authorization (EUA). This EUA will remain in effect (meaning this test can be used) for the duration of the COVID-19 declaration under Section 564(b)(1) of the Act, 21 U.S.C. section 360bbb-3(b)(1), unless the authorization is terminated or revoked.  Performed at [facility], [address] [place], [place] [address]   Blood Culture (routine x 2)     Status: None (Preliminary result)   Collection Time: 10/23/23  2:08 AM   Specimen: BLOOD  Result Value Ref Range Status   Specimen Description BLOOD BLOOD LEFT FOREARM  Final   Special Requests   Final    BOTTLES DRAWN AEROBIC AND ANAEROBIC Blood  Culture adequate volume   Culture   Final    NO GROWTH 3 DAYS Performed at [facility], [address] [place], [place] [address]    Report Status PENDING  Incomplete  Respiratory (~20 pathogens) panel by PCR     Status: None   Collection Time: 10/23/23 11:48 AM   Specimen: Nasopharyngeal Swab; Respiratory  Result Value Ref Range Status   Adenovirus NOT DETECTED NOT DETECTED Final   Coronavirus 229E NOT DETECTED NOT DETECTED Final    Comment: (NOTE) The Coronavirus on the Respiratory Panel, DOES NOT test for the novel  Coronavirus (2019 nCoV)    Coronavirus HKU1 NOT DETECTED NOT DETECTED Final   Coronavirus NL63 NOT DETECTED NOT DETECTED Final   Coronavirus OC43 NOT DETECTED NOT DETECTED Final   Metapneumovirus NOT DETECTED NOT DETECTED Final   Rhinovirus /  Enterovirus NOT DETECTED NOT DETECTED Final   Influenza A NOT DETECTED NOT DETECTED Final   Influenza B NOT DETECTED NOT DETECTED Final   Parainfluenza Virus 1 NOT DETECTED NOT DETECTED Final   Parainfluenza Virus 2 NOT DETECTED NOT DETECTED Final   Parainfluenza Virus 3 NOT DETECTED NOT DETECTED Final   Parainfluenza Virus 4 NOT DETECTED NOT DETECTED Final   Respiratory Syncytial Virus NOT DETECTED NOT DETECTED Final   Bordetella pertussis NOT DETECTED NOT DETECTED Final   Bordetella Parapertussis NOT DETECTED NOT DETECTED Final   Chlamydophila pneumoniae NOT DETECTED NOT DETECTED Final   Mycoplasma pneumoniae NOT DETECTED NOT DETECTED Final    Comment: Performed at [facility], [address] [place], [place] [address]  Culture, blood (Routine X 2) w Reflex to ID Panel     Status: None (Preliminary result)   Collection Time: 10/24/23  3:42 AM   Specimen: BLOOD  Result Value Ref Range Status   Specimen Description BLOOD BLOOD RIGHT ARM  Final   Special Requests   Final    BOTTLES DRAWN AEROBIC AND ANAEROBIC Blood Culture results may not be optimal due to an inadequate volume of blood received in culture  bottles   Culture   Final    NO GROWTH 2 DAYS Performed at [facility], [address], [place], [place] [address]    Report Status PENDING  Incomplete  Expectorated Sputum Assessment w Gram Stain, Rflx to Resp Cult     Status: None   Collection Time: 10/24/23  7:42 AM   Specimen: SPU  Result Value Ref Range Status   Specimen Description SPUTUM  Final   Special Requests NONE  Final   Sputum evaluation   Final    THIS SPECIMEN IS ACCEPTABLE FOR SPUTUM CULTURE Performed at [facility], [address], [place], [place] [address]    Report Status 10/24/2023 FINAL  Final  Culture, Respiratory w Gram Stain     Status: None (Preliminary result)   Collection Time: 10/24/23  7:42 AM   Specimen: SPU  Result Value Ref Range Status   Specimen Description   Final    SPUTUM Performed at [facility], [address], [place], [place] [address]    Special Requests   Final    NONE Reflexed from [id number] Performed at [facility], [address] [place], [place] [address]    Gram Stain   Final    RARE WBC PRESENT, PREDOMINANTLY MONONUCLEAR RARE GRAM POSITIVE COCCI IN PAIRS AND CHAINS Performed at [facility], [address] [place], [place] [address]    Culture PENDING  Incomplete   Report Status PENDING  Incomplete    Labs: CBC: Recent Labs  Lab 10/22/23 2246 10/24/23 0342 10/25/23 0704 10/26/23 0533  WBC 21.6* 20.8* 16.0* 11.3*  NEUTROABS  --   --  10.1* 5.5  HGB 13.4 12.5 12.6 13.7  HCT 40.4 38.0 38.0 41.6  MCV 93.7 92.2 91.6 91.0  PLT 336 310 353 415*   Basic Metabolic Panel: Recent Labs  Lab 10/22/23 2246 10/23/23 0208 10/24/23 0342 10/25/23 0540 10/26/23 0533  NA 135  --  139 138 138  K 3.0*  --  3.3* 3.5 3.9  CL 99  --  104 105 102  CO2 23  --  25 23 25   GLUCOSE 161*  --  262* 99 122*  BUN 7  --  18 13 20   CREATININE 0.45  --  0.62 0.42* 0.51  CALCIUM  8.3*  --  9.3 8.8* 9.3  MG  --  2.0  --   --  2.2   Liver  Function Tests: No results for input(s): AST, ALT, ALKPHOS, BILITOT, PROT, ALBUMIN in the last 168 hours. CBG: Recent Labs  Lab 10/25/23 0750 10/25/23 1137 10/25/23 1636 10/25/23 2144 10/26/23 0749  GLUCAP 112* 105* 151* 234* 123*    Discharge time spent: greater than 30 minutes.  Signed: AIDA CHO, MD [facility] 10/26/2023

## 2023-10-26 NOTE — Plan of Care (Signed)
  Problem: Clinical Measurements: Goal: Ability to maintain clinical measurements within normal limits will improve Outcome: Progressing Goal: Diagnostic test results will improve Outcome: Progressing   Problem: Activity: Goal: Risk for activity intolerance will decrease Outcome: Progressing   Problem: Coping: Goal: Level of anxiety will decrease Outcome: Progressing   Problem: Pain Management: Goal: General experience of comfort will improve Outcome: Progressing   Problem: Activity: Goal: Ability to tolerate increased activity will improve Outcome: Progressing   Problem: Activity: Goal: Ability to tolerate increased activity will improve Outcome: Progressing

## 2023-10-28 LAB — CULTURE, RESPIRATORY W GRAM STAIN: Culture: NORMAL

## 2023-10-28 LAB — CULTURE, BLOOD (ROUTINE X 2)
Culture: NO GROWTH
Special Requests: ADEQUATE

## 2023-10-29 LAB — FUNGITELL BETA-D-GLUCAN
Fungitell Value:: <31.25 pg/mL
Result Name:: NEGATIVE

## 2023-10-29 LAB — CULTURE, BLOOD (ROUTINE X 2): Culture: NO GROWTH

## 2023-12-06 ENCOUNTER — Ambulatory Visit
Admission: RE | Admit: 2023-12-06 | Discharge: 2023-12-06 | Disposition: A | Discharge status: Home / Self Care | Payer: 59 | Source: Ambulatory Visit | Attending: Family Medicine | Admitting: Family Medicine

## 2023-12-06 ENCOUNTER — Other Ambulatory Visit: Payer: Self-pay | Admitting: Family Medicine

## 2023-12-06 DIAGNOSIS — J441 Chronic obstructive pulmonary disease with (acute) exacerbation: Secondary | ICD-10-CM

## 2024-01-03 ENCOUNTER — Other Ambulatory Visit: Payer: Self-pay | Admitting: Specialist

## 2024-01-03 DIAGNOSIS — R918 Other nonspecific abnormal finding of lung field: Secondary | ICD-10-CM

## 2024-01-03 DIAGNOSIS — J449 Chronic obstructive pulmonary disease, unspecified: Secondary | ICD-10-CM

## 2024-01-08 ENCOUNTER — Ambulatory Visit
Admission: RE | Admit: 2024-01-08 | Discharge: 2024-01-08 | Disposition: A | Discharge status: Home / Self Care | Source: Ambulatory Visit | Attending: Nurse Practitioner | Admitting: Nurse Practitioner

## 2024-01-08 DIAGNOSIS — Z1231 Encounter for screening mammogram for malignant neoplasm of breast: Secondary | ICD-10-CM | POA: Diagnosis present

## 2024-01-14 ENCOUNTER — Inpatient Hospital Stay
Admission: RE | Admit: 2024-01-14 | Discharge: 2024-01-14 | Disposition: A | Discharge status: Home / Self Care | Payer: Self-pay | Source: Ambulatory Visit | Attending: Nurse Practitioner | Admitting: Nurse Practitioner

## 2024-01-14 ENCOUNTER — Other Ambulatory Visit: Payer: Self-pay | Admitting: *Deleted

## 2024-01-14 DIAGNOSIS — Z1231 Encounter for screening mammogram for malignant neoplasm of breast: Secondary | ICD-10-CM

## 2024-01-21 ENCOUNTER — Ambulatory Visit

## 2024-01-22 ENCOUNTER — Ambulatory Visit
Admission: RE | Admit: 2024-01-22 | Discharge: 2024-01-22 | Disposition: A | Discharge status: Home / Self Care | Source: Ambulatory Visit | Attending: Specialist | Admitting: Specialist

## 2024-01-22 DIAGNOSIS — R918 Other nonspecific abnormal finding of lung field: Secondary | ICD-10-CM | POA: Diagnosis present

## 2024-01-22 DIAGNOSIS — J449 Chronic obstructive pulmonary disease, unspecified: Secondary | ICD-10-CM | POA: Insufficient documentation

## 2024-03-15 ENCOUNTER — Ambulatory Visit
Admission: EM | Admit: 2024-03-15 | Discharge: 2024-03-15 | Disposition: A | Discharge status: Home / Self Care | Attending: Emergency Medicine | Admitting: Emergency Medicine

## 2024-03-15 ENCOUNTER — Other Ambulatory Visit: Payer: Self-pay

## 2024-03-15 ENCOUNTER — Encounter: Payer: Self-pay | Admitting: *Deleted

## 2024-03-15 DIAGNOSIS — U071 COVID-19: Secondary | ICD-10-CM | POA: Diagnosis not present

## 2024-03-15 DIAGNOSIS — R051 Acute cough: Secondary | ICD-10-CM | POA: Diagnosis not present

## 2024-03-15 MED ORDER — PAXLOVID (300/100) 20 X 150 MG & 10 X 100MG PO TBPK: 3.0000 | ORAL_TABLET | Freq: Two times a day (BID) | ORAL | 0 refills | Status: AC

## 2024-03-15 MED ORDER — ACETAMINOPHEN-CODEINE 300-30 MG PO TABS: 1.0000 | ORAL_TABLET | Freq: Four times a day (QID) | ORAL | 0 refills | Status: AC | PRN

## 2024-03-15 NOTE — Discharge Instructions (Signed)
 19 is a virus and should improve with time  Begin Paxlovid  twice daily for 5 days to reduce the amount of virus in the body which helps to minimize symptoms  Per the CDC you will need to quarantine until you are without a fever for 24 hours, if no fever may continue activity wearing mask, as a courtesy let those around you know that you have tested positive  You may use Tylenol  3 which has codeine  in it in addition to over-the-counter cough and congestion medicine which will provide you the relief of the cough medicine you were using, may take every 6 hours, may make you drowsy  You can take Tylenol  and/or Ibuprofen  as needed for fever reduction and pain relief.   For cough: honey 1/2 to 1 teaspoon (you can dilute the honey in water or another fluid).  You can also use guaifenesin  and dextromethorphan  for cough. You can use a humidifier for chest congestion and cough.  If you don't have a humidifier, you can sit in the bathroom with the hot shower running.      For sore throat: try warm salt water gargles, cepacol lozenges, throat spray, warm tea or water with lemon/honey, popsicles or ice, or OTC cold relief medicine for throat discomfort.   For congestion: take a daily anti-histamine like Zyrtec , Claritin , and a oral decongestant, such as pseudoephedrine.  You can also use Flonase  1-2 sprays in each nostril daily.   It is important to stay hydrated: drink plenty of fluids (water, gatorade/powerade/pedialyte, juices, or teas) to keep your throat moisturized and help further relieve irritation/discomfort.

## 2024-03-15 NOTE — ED Provider Notes (Signed)
 Catherine Giles    CSN: [id number] Arrival date & time: 03/15/24  1145      History   Chief Complaint Chief Complaint  Patient presents with   Shortness of Breath   Headache   Diarrhea   Cough    HPI Catherine Giles is a 53 y.o. female.   Patient presents for evaluation of generalized bodyaches, nasal congestion, rhinorrhea, fatigue, malaise, productive cough with green sputum, shortness of breath, nausea without vomiting and diarrhea beginning 3 days ago.  Home COVID test positive x 2.  Has attempted use of nebulizer.  On 2 L O2 as needed at home.  History of emphysema, COPD, asthma.  Past Medical History:  Diagnosis Date   Arthritis    RA   Asthma    COPD (chronic obstructive pulmonary disease) (HCC)    Depression    Diabetes mellitus without complication (HCC)    Emphysema of lung (HCC)    Family history of breast cancer    6/21 cancer genetic testing letter sent   Fibromyalgia    Hypertension    Lupus    Pre-diabetes    Thyroid  disease     Patient Active Problem List   Diagnosis Date Noted   Sepsis due to pneumonia (HCC) 10/23/2023   Pneumonia 10/23/2023   Hemoptysis 06/04/2023   Bronchopneumonia 10/09/2022   Influenza A 10/07/2022   Depression with anxiety 10/07/2022   COPD exacerbation (HCC) 10/07/2022   HTN (hypertension) 10/07/2022   Tobacco abuse 10/07/2022   Chest pain 09/10/2022   COPD (chronic obstructive pulmonary disease) (HCC)    Hypothyroidism    Nicotine  dependence, cigarettes, uncomplicated    Chronic diastolic CHF (congestive heart failure) (HCC)    Sepsis (HCC)    Allergy to alpha-gal 01/07/2022   Depression, major, single episode, mild (HCC) 01/24/2021   Dairy allergy 10/12/2020   Vaginal stenosis 03/19/2020   Obesity 03/19/2020   Insomnia 01/05/2020   Complex regional pain syndrome I of upper limb 12/17/2019   Hypertension associated with diabetes (HCC) 12/13/2019   Type II diabetes mellitus with complication (HCC)  12/13/2019   Hashimoto's thyroiditis 12/13/2019   DDD (degenerative disc disease), lumbar 08/26/2019   Multinodular goiter 08/14/2019   Chronic left hip pain 05/05/2019   Osteoarthritis of spine with radiculopathy, cervical region 05/05/2019   Polypharmacy 02/14/2019   Fibromyalgia 02/10/2019   Rheumatoid arthritis involving multiple sites with positive rheumatoid factor (HCC) 02/10/2019   Cervical radiculopathy 03/13/2018   Vitamin D deficiency 10/11/2015    Past Surgical History:  Procedure Laterality Date   CHOLECYSTECTOMY, LAPAROSCOPIC  02/2022   MOUTH SURGERY     TRANSTHORACIC ECHOCARDIOGRAM  04/2020   EF 60 to 65%.  Normal LV function.  No R WMA.  GR 1 DD.  Normal RV size and function.  Normal PAP.  Normal atrial size.  Normal valves.    OB History     Gravida  9   Para  6   Term  0   Preterm  0   AB  3   Living         SAB  2   IAB  0   Ectopic  0   Multiple      Live Births               Home Medications    Prior to Admission medications   Medication Sig Start Date End Date Taking? Authorizing Provider  acetaminophen -codeine  (TYLENOL  #3) 300-30 MG tablet Take 1-2 tablets  by mouth every 6 (six) hours as needed for moderate pain (pain score 4-6). 03/15/24  Yes Rasheema Truluck, Maybelle Spatz, NP  albuterol  (VENTOLIN  HFA) 108 (90 Base) MCG/ACT inhaler Inhale 1-2 puffs into the lungs every 6 (six) hours as needed for shortness of breath or wheezing.   Yes [provider]  amLODipine  (NORVASC ) 10 MG tablet Take 1 tablet (10 mg total) by mouth daily. 09/05/21  Yes Cannady, Jolene T, NP  atorvastatin  (LIPITOR) 40 MG tablet Take 40 mg by mouth daily. 10/15/23  Yes [provider]  Blood Glucose Monitoring Suppl (ONETOUCH VERIO) w/Device KIT Use to check blood sugar 3 times daily with goal <130 fasting and <180 two hours after meal.  Bring meter to visits. 01/25/21  Yes Cannady, Jolene T, NP  cetirizine  (ZYRTEC ) 10 MG tablet Take 1 tablet (10 mg total) by  mouth daily as needed for rhinitis or allergies. 10/26/23  Yes Sheril Dines, MD  dapagliflozin  propanediol (FARXIGA ) 10 MG TABS tablet Take 1 tablet (10 mg total) by mouth daily before breakfast. 03/16/21  Yes Cannady, Jolene T, NP  DULoxetine  (CYMBALTA ) 60 MG capsule Take 60 mg by mouth daily. 09/12/23  Yes [provider]  EPINEPHrine  0.3 mg/0.3 mL IJ SOAJ injection Inject 0.3 mg into the muscle as needed for anaphylaxis. 02/15/22  Yes Cannady, Jolene T, NP  glucose blood test strip Use to check blood sugar 3 times daily with goal <130 fasting and <180 two hours after meal. 01/25/21  Yes Cannady, Jolene T, NP  hydrOXYzine  (VISTARIL ) 25 MG capsule TAKE 1 CAPSULE BY MOUTH EVERY 8 HOURS ASNEEDED Patient taking differently: Take 25 mg by mouth every 8 (eight) hours as needed for anxiety or itching. TAKE 1 CAPSULE BY MOUTH EVERY 8 HOURS ASNEEDED 01/03/22  Yes Cindee Crazier, NP  Lancets [facility] ULTRASOFT) lancets Use to check blood sugar 3 times daily with goal <130 fasting and <180 two hours after meal. 01/25/21  Yes Cannady, Jolene T, NP  Lancets (ONETOUCH ULTRASOFT) lancets Use as directed 03/21/23  Yes Buell Carmin, MD  levothyroxine  (SYNTHROID ) 50 MCG tablet Take 50 mcg by mouth daily before breakfast.    Yes [provider]  losartan  (COZAAR ) 100 MG tablet Take 100 mg by mouth daily.   Yes [provider]  metoprolol  succinate (TOPROL -XL) 25 MG 24 hr tablet Take 1 tablet by mouth daily. 08/27/23 08/26/24 Yes [provider]  montelukast  (SINGULAIR ) 10 MG tablet Take 10 mg by mouth daily. 02/15/22  Yes [provider]  nirmatrelvir /ritonavir  (PAXLOVID , 300/100,) 20 x 150 MG & 10 x 100MG  TBPK Take 3 tablets by mouth 2 (two) times daily for 5 days. Patient GFR is 60. Take nirmatrelvir  (150 mg) two tablets twice daily for 5 days and ritonavir  (100 mg) one tablet twice daily for 5 days. 03/15/24 03/20/24 Yes Yaa Donnellan, Maybelle Spatz, NP  omeprazole (PRILOSEC) 20 MG capsule Take  20 mg by mouth 2 (two) times daily. 08/08/23  Yes [provider]  potassium chloride  (MICRO-K ) 10 MEQ CR capsule Take 10 mEq by mouth daily. 08/27/23   [provider]  Gaylan Kaufman 200-62.5-25 MCG/ACT AEPB Inhale 1 puff into the lungs daily. 09/11/22   [provider]  TRUE VITAMIN D3 10 MCG (400 UNIT) CAPS Take 2 capsules by mouth daily. 08/26/23   [provider]  traZODone  (DESYREL ) 50 MG tablet Take 1 tablet (50 mg total) by mouth at bedtime. 01/05/20 07/01/20  Cannady, Jolene T, NP    Family History Family History  Problem Relation Age of Onset   Breast cancer Mother 21   Lung cancer Mother    Diabetes Mother    Hypertension Father    Cancer Maternal Aunt    Ovarian cancer Maternal Aunt    Diabetes Maternal Aunt    Diabetes Maternal Uncle    Breast cancer Paternal Aunt    Hypertension Maternal Grandmother    Diabetes Maternal Grandmother     Social History Social History   Tobacco Use   Smoking status: Every Day    Current packs/day: 1.00    Average packs/day: 1 pack/day for 34.0 years (34.0 ttl pk-yrs)    Types: Cigarettes   Smokeless tobacco: Never   Tobacco comments:    2-3 cig/day  Vaping Use   Vaping status: Never Used  Substance Use Topics   Alcohol use: Yes    Comment: Occ   Drug use: Not Currently     Allergies   Azithromycin, Molds & smuts, Alpha-gal, Penicillins, and Shellfish allergy   Review of Systems Review of Systems  Respiratory:  Positive for cough and shortness of breath.   Gastrointestinal:  Positive for diarrhea.  Neurological:  Positive for headaches.     Physical Exam Triage Vital Signs ED Triage Vitals  Encounter Vitals Group     BP 03/15/24 1250 (!) 172/90     Systolic BP Percentile --      Diastolic BP Percentile --      Pulse Rate 03/15/24 1250 95     Resp 03/15/24 1250 (!) 22     Temp 03/15/24 1250 98.8 F (37.1 C)     Temp src --      SpO2 03/15/24 1250 91 %     Weight --       Height --      Head Circumference --      Peak Flow --      Pain Score 03/15/24 1248 10     Pain Loc --      Pain Education --      Exclude from Growth Chart --    No data found.  Updated Vital Signs BP (!) 172/90   Pulse 95   Temp 98.8 F (37.1 C)   Resp (!) 22   SpO2 91%   Visual Acuity Right Eye Distance:   Left Eye Distance:   Bilateral Distance:    Right Eye Near:   Left Eye Near:    Bilateral Near:     Physical Exam Constitutional:      Appearance: Normal appearance.  HENT:     Head: Normocephalic.     Right Ear: Tympanic membrane, ear canal and external ear normal.     Left Ear: Tympanic membrane, ear canal and external ear normal.     Nose: Congestion present.     Mouth/Throat:     Pharynx: Posterior oropharyngeal erythema present. No oropharyngeal exudate.  Cardiovascular:     Rate and Rhythm: Normal rate and regular rhythm.     Pulses: Normal pulses.     Heart sounds: Normal heart sounds.  Pulmonary:     Effort: Pulmonary effort is normal.     Breath sounds: Normal breath sounds.  Chest:     Comments: central wall pain, chest wall symmetrical Neurological:     Mental Status: She is alert and oriented to person, place, and time. Mental status is at baseline.      UC Treatments / Results  Labs (all labs ordered are listed, but only abnormal  results are displayed) Labs Reviewed - No data to display  EKG   Radiology No results found.  Procedures Procedures (including critical care time)  Medications Ordered in UC Medications - No data to display  Initial Impression / Assessment and Plan / UC Course  I have reviewed the triage vital signs and the nursing notes.  Pertinent labs & imaging results that were available during my care of the patient were reviewed by me and considered in my medical decision making (see chart for details).  Covid 19, acute cough  Patient is in no signs of distress nor toxic appearing.  Vital signs are stable.   Low suspicion for pneumonia, pneumothorax or bronchitis and therefore will defer imaging.  Prescribed Paxlovid  and Tylenol  3, PDMP reviewed, low risk.May use additional over-the-counter medications as needed for supportive care.  May follow-up with urgent care as needed if symptoms persist or worsen.  Final Clinical Impressions(s) / UC Diagnoses   Final diagnoses:  COVID-19  Acute cough   Discharge Instructions      19 is a virus and should improve with time  Begin Paxlovid  twice daily for 5 days to reduce the amount of virus in the body which helps to minimize symptoms  Per the CDC you will need to quarantine until you are without a fever for 24 hours, if no fever may continue activity wearing mask, as a courtesy let those around you know that you have tested positive  You may use Tylenol  3 which has codeine  in it in addition to over-the-counter cough and congestion medicine which will provide you the relief of the cough medicine you were using, may take every 6 hours, may make you drowsy  You can take Tylenol  and/or Ibuprofen  as needed for fever reduction and pain relief.   For cough: honey 1/2 to 1 teaspoon (you can dilute the honey in water or another fluid).  You can also use guaifenesin  and dextromethorphan  for cough. You can use a humidifier for chest congestion and cough.  If you don't have a humidifier, you can sit in the bathroom with the hot shower running.      For sore throat: try warm salt water gargles, cepacol lozenges, throat spray, warm tea or water with lemon/honey, popsicles or ice, or OTC cold relief medicine for throat discomfort.   For congestion: take a daily anti-histamine like Zyrtec , Claritin , and a oral decongestant, such as pseudoephedrine.  You can also use Flonase  1-2 sprays in each nostril daily.   It is important to stay hydrated: drink plenty of fluids (water, gatorade/powerade/pedialyte, juices, or teas) to keep your throat moisturized and help further  relieve irritation/discomfort.   ED Prescriptions     Medication Sig Dispense Auth. Provider   nirmatrelvir /ritonavir  (PAXLOVID , 300/100,) 20 x 150 MG & 10 x 100MG  TBPK Take 3 tablets by mouth 2 (two) times daily for 5 days. Patient GFR is 60. Take nirmatrelvir  (150 mg) two tablets twice daily for 5 days and ritonavir  (100 mg) one tablet twice daily for 5 days. 30 tablet Otto Felkins R, NP   acetaminophen -codeine  (TYLENOL  #3) 300-30 MG tablet Take 1-2 tablets by mouth every 6 (six) hours as needed for moderate pain (pain score 4-6). 15 tablet Delanie Tirrell R, NP      I have reviewed the PDMP during this encounter.   Reena Canning, NP 03/15/24 1447

## 2024-03-15 NOTE — ED Triage Notes (Signed)
 PT had a positive home COVID test 2 days ago. Pt reports having pain and SHOB.  Pt uses 2 litrs of O2 at home  PRN. Pt has had to increase the usage of Nasal O2.

## 2024-04-20 ENCOUNTER — Ambulatory Visit: Admission: EM | Admit: 2024-04-20 | Discharge: 2024-04-20 | Disposition: A | Discharge status: Home / Self Care

## 2024-04-20 ENCOUNTER — Ambulatory Visit (INDEPENDENT_AMBULATORY_CARE_PROVIDER_SITE_OTHER)

## 2024-04-20 DIAGNOSIS — J441 Chronic obstructive pulmonary disease with (acute) exacerbation: Secondary | ICD-10-CM

## 2024-04-20 DIAGNOSIS — R051 Acute cough: Secondary | ICD-10-CM

## 2024-04-20 DIAGNOSIS — J029 Acute pharyngitis, unspecified: Secondary | ICD-10-CM | POA: Diagnosis not present

## 2024-04-20 DIAGNOSIS — R0781 Pleurodynia: Secondary | ICD-10-CM

## 2024-04-20 LAB — POCT RAPID STREP A (OFFICE): Rapid Strep A Screen: NEGATIVE

## 2024-04-20 MED ORDER — LIDOCAINE 5 % EX PTCH: 1.0000 | MEDICATED_PATCH | CUTANEOUS | 0 refills | Status: AC

## 2024-04-20 MED ORDER — LIDOCAINE VISCOUS HCL 2 % MT SOLN: 15.0000 mL | Freq: Four times a day (QID) | OROMUCOSAL | 0 refills | Status: AC | PRN

## 2024-04-20 NOTE — ED Provider Notes (Signed)
 [name]    CSN: [id number] Arrival date & time: 04/20/24  1009      History   Chief Complaint Chief Complaint  Patient presents with   Cough   Sore Throat    HPI Catherine Giles is a 53 y.o. female.   Patient presents today with an 8-day history of URI symptoms including cough, shortness of breath, pain in her rib cage secondary to coughing, left-sided sore throat.  She denies any fever or chest pain.  She was diagnosed with COVID and treated with Paxlovid  03/16/2024.  Since then she has had intermittent COPD exacerbations and was treated with doxycycline  and Medrol  Dosepak 04/10/2024.  Her symptoms did not improve and she traveled to [place]  where she was seen in the emergency room on 04/14/2024.  There was concern for beginning pneumonia and so she was started on Levaquin .  Did consider a CTA but she did not have this done as she preferred to leave.  She is using over-the-counter medications including Mucinex  and over-the-counter cough syrup without improvement of symptoms.  She is on chronic prednisone  for chronic lung disease and lupus; she has recently increased her dose to 20 mg daily but this has not improved her symptoms.  She denies any known sick contacts with recently travel.  She does have oxygen at home to be used as needed.  She has had to use it more frequently and at a slightly higher rate (3 L compared to 2 L) for the past few days because of her symptoms.  She was last hospitalized for COPD in September 2024.    Past Medical History:  Diagnosis Date   Arthritis    RA   Asthma    COPD (chronic obstructive pulmonary disease) (HCC)    Depression    Diabetes mellitus without complication (HCC)    Emphysema of lung (HCC)    Family history of breast cancer    6/21 cancer genetic testing letter sent   Fibromyalgia    Hypertension    Lupus    Pre-diabetes    Thyroid  disease     Patient Active Problem List   Diagnosis Date Noted   Sepsis due to  pneumonia (HCC) 10/23/2023   Pneumonia 10/23/2023   Hemoptysis 06/04/2023   Bronchopneumonia 10/09/2022   Influenza A 10/07/2022   Depression with anxiety 10/07/2022   COPD exacerbation (HCC) 10/07/2022   HTN (hypertension) 10/07/2022   Tobacco abuse 10/07/2022   Chest pain 09/10/2022   COPD (chronic obstructive pulmonary disease) (HCC)    Hypothyroidism    Nicotine  dependence, cigarettes, uncomplicated    Chronic diastolic CHF (congestive heart failure) (HCC)    Sepsis (HCC)    Allergy to alpha-gal 01/07/2022   Depression, major, single episode, mild (HCC) 01/24/2021   Dairy allergy 10/12/2020   Vaginal stenosis 03/19/2020   Obesity 03/19/2020   Insomnia 01/05/2020   Complex regional pain syndrome I of upper limb 12/17/2019   Hypertension associated with diabetes (HCC) 12/13/2019   Type II diabetes mellitus with complication (HCC) 12/13/2019   Hashimoto's thyroiditis 12/13/2019   DDD (degenerative disc disease), lumbar 08/26/2019   Multinodular goiter 08/14/2019   Chronic left hip pain 05/05/2019   Osteoarthritis of spine with radiculopathy, cervical region 05/05/2019   Polypharmacy 02/14/2019   Fibromyalgia 02/10/2019   Rheumatoid arthritis involving multiple sites with positive rheumatoid factor (HCC) 02/10/2019   Cervical radiculopathy 03/13/2018   Vitamin D deficiency 10/11/2015    Past Surgical History:  Procedure Laterality Date  CHOLECYSTECTOMY, LAPAROSCOPIC  02/2022   MOUTH SURGERY     TRANSTHORACIC ECHOCARDIOGRAM  04/2020   EF 60 to 65%.  Normal LV function.  No R WMA.  GR 1 DD.  Normal RV size and function.  Normal PAP.  Normal atrial size.  Normal valves.    OB History     Gravida  9   Para  6   Term  0   Preterm  0   AB  3   Living         SAB  2   IAB  0   Ectopic  0   Multiple      Live Births               Home Medications    Prior to Admission medications   Medication Sig Start Date End Date Taking? Authorizing Provider   levofloxacin  (LEVAQUIN ) 500 MG tablet Take 500 mg by mouth. 04/15/24 04/25/24 Yes [provider]  lidocaine  (LIDODERM ) 5 % Place 1 patch onto the skin daily. Remove & Discard patch within 12 hours or as directed by MD 04/20/24  Yes Tatijana Bierly K, PA-C  lidocaine  (XYLOCAINE ) 2 % solution Use as directed 15 mLs in the mouth or throat every 6 (six) hours as needed for mouth pain. 04/20/24  Yes Azarel Banner K, PA-C  methylPREDNISolone  (MEDROL  DOSEPAK) 4 MG TBPK tablet SMARTSIG:- Tablet(s) By Mouth - 04/10/24  Yes [provider]  acetaminophen -codeine  (TYLENOL  #3) 300-30 MG tablet Take 1-2 tablets by mouth every 6 (six) hours as needed for moderate pain (pain score 4-6). 03/15/24   White, Shelba SAUNDERS, NP  albuterol  (VENTOLIN  HFA) 108 (90 Base) MCG/ACT inhaler Inhale 1-2 puffs into the lungs every 6 (six) hours as needed for shortness of breath or wheezing.    [provider]  amLODipine  (NORVASC ) 10 MG tablet Take 1 tablet (10 mg total) by mouth daily. 09/05/21   Cannady, Jolene T, NP  atorvastatin  (LIPITOR) 40 MG tablet Take 40 mg by mouth daily. 10/15/23   [provider]  Blood Glucose Monitoring Suppl (ONETOUCH VERIO) w/Device KIT Use to check blood sugar 3 times daily with goal <130 fasting and <180 two hours after meal.  Bring meter to visits. 01/25/21   Cannady, Jolene T, NP  buPROPion (WELLBUTRIN SR) 150 MG 12 hr tablet Take 150 mg by mouth 2 (two) times daily.    [provider]  cetirizine  (ZYRTEC ) 10 MG tablet Take 1 tablet (10 mg total) by mouth daily as needed for rhinitis or allergies. 10/26/23   Jens Durand, MD  dapagliflozin  propanediol (FARXIGA ) 10 MG TABS tablet Take 1 tablet (10 mg total) by mouth daily before breakfast. Patient not taking: Reported on 04/20/2024 03/16/21   Cannady, Jolene T, NP  DULoxetine  (CYMBALTA ) 60 MG capsule Take 60 mg by mouth daily. 09/12/23   [provider]  EPINEPHrine  0.3 mg/0.3 mL IJ SOAJ injection Inject 0.3  mg into the muscle as needed for anaphylaxis. 02/15/22   Valerio Moris T, NP  glucose blood test strip Use to check blood sugar 3 times daily with goal <130 fasting and <180 two hours after meal. 01/25/21   Cannady, Jolene T, NP  hydroxychloroquine (PLAQUENIL) 200 MG tablet Take 1 tablet by mouth 2 (two) times daily. Patient not taking: Reported on 04/20/2024 03/04/24   [provider]  hydrOXYzine  (VISTARIL ) 25 MG capsule TAKE 1 CAPSULE BY MOUTH EVERY 8 HOURS ASNEEDED Patient taking differently: Take 25 mg by mouth  every 8 (eight) hours as needed for anxiety or itching. TAKE 1 CAPSULE BY MOUTH EVERY 8 HOURS ASNEEDED 01/03/22   Daphane Rosella, NP  ipratropium (ATROVENT ) 0.06 % nasal spray Place into both nostrils.    [provider]  Lancets [facility] ULTRASOFT) lancets Use to check blood sugar 3 times daily with goal <130 fasting and <180 two hours after meal. 01/25/21   Cannady, Jolene T, NP  Lancets (ONETOUCH ULTRASOFT) lancets Use as directed 03/21/23   Cyrena Mylar, MD  levothyroxine  (SYNTHROID ) 50 MCG tablet Take 50 mcg by mouth daily before breakfast.     [provider]  losartan  (COZAAR ) 100 MG tablet Take 100 mg by mouth daily.    [provider]  metFORMIN  (GLUCOPHAGE -XR) 500 MG 24 hr tablet Take 500 mg by mouth 2 (two) times daily. Patient not taking: Reported on 04/20/2024    [provider]  metoprolol  succinate (TOPROL -XL) 25 MG 24 hr tablet Take 1 tablet by mouth daily. 08/27/23 08/26/24  [provider]  montelukast  (SINGULAIR ) 10 MG tablet Take 10 mg by mouth daily. 02/15/22   [provider]  omeprazole (PRILOSEC) 20 MG capsule Take 20 mg by mouth 2 (two) times daily. 08/08/23   [provider]  potassium chloride  (MICRO-K ) 10 MEQ CR capsule Take 10 mEq by mouth daily. Patient not taking: Reported on 04/20/2024 08/27/23   [provider]  RINVOQ 15 MG TB24 Take 1 tablet by mouth daily.    [provider]   DOMINIC BECK 200-62.5-25 MCG/ACT AEPB Inhale 1 puff into the lungs daily. 09/11/22   [provider]  TRUE VITAMIN D3 10 MCG (400 UNIT) CAPS Take 2 capsules by mouth daily. 08/26/23   [provider]  traZODone  (DESYREL ) 50 MG tablet Take 1 tablet (50 mg total) by mouth at bedtime. 01/05/20 07/01/20  Valerio Moris T, NP    Family History Family History  Problem Relation Age of Onset   Breast cancer Mother 38   Lung cancer Mother    Diabetes Mother    Hypertension Father    Cancer Maternal Aunt    Ovarian cancer Maternal Aunt    Diabetes Maternal Aunt    Diabetes Maternal Uncle    Breast cancer Paternal Aunt    Hypertension Maternal Grandmother    Diabetes Maternal Grandmother     Social History Social History   Tobacco Use   Smoking status: Every Day    Current packs/day: 1.00    Average packs/day: 1 pack/day for 34.0 years (34.0 ttl pk-yrs)    Types: Cigarettes   Smokeless tobacco: Never   Tobacco comments:    2-3 cig/day  Vaping Use   Vaping status: Never Used  Substance Use Topics   Alcohol use: Yes    Comment: Occ   Drug use: Not Currently     Allergies   Azithromycin, Molds & smuts, Alpha-gal, Penicillins, and Shellfish allergy   Review of Systems Review of Systems  Constitutional:  Positive for activity change and fatigue. Negative for appetite change and fever.  HENT:  Positive for congestion and sore throat. Negative for sinus pressure and sneezing.   Respiratory:  Positive for cough and shortness of breath.   Cardiovascular:  Negative for chest pain.  Gastrointestinal:  Negative for abdominal pain, diarrhea, nausea and vomiting.     Physical Exam Triage Vital Signs ED Triage Vitals  Encounter Vitals Group     BP 04/20/24 1021 135/75     Girls Systolic BP Percentile --  Girls Diastolic BP Percentile --      Boys Systolic BP Percentile --      Boys Diastolic BP Percentile --      Pulse Rate 04/20/24 1021 (!) 103     Resp  04/20/24 1021 (!) 23     Temp 04/20/24 1021 (!) 97.5 F (36.4 C)     Temp src --      SpO2 04/20/24 1021 93 %     Weight --      Height --      Head Circumference --      Peak Flow --      Pain Score 04/20/24 1015 10     Pain Loc --      Pain Education --      Exclude from Growth Chart --    No data found.  Updated Vital Signs BP 132/89   Pulse 100   Temp (!) 97.5 F (36.4 C)   Resp 20   SpO2 91%   Visual Acuity Right Eye Distance:   Left Eye Distance:   Bilateral Distance:    Right Eye Near:   Left Eye Near:    Bilateral Near:     Physical Exam Vitals reviewed.  Constitutional:      General: She is awake. She is not in acute distress.    Appearance: Normal appearance. She is well-developed. She is not ill-appearing.     Comments: Very pleasant female appears stated age in no acute distress sitting comfortably in exam room  HENT:     Head: Normocephalic and atraumatic.     Right Ear: Tympanic membrane, ear canal and external ear normal. Tympanic membrane is not erythematous or bulging.     Left Ear: Tympanic membrane, ear canal and external ear normal. Tympanic membrane is not erythematous or bulging.     Nose:     Right Sinus: No maxillary sinus tenderness or frontal sinus tenderness.     Left Sinus: No maxillary sinus tenderness or frontal sinus tenderness.     Mouth/Throat:     Pharynx: Uvula midline. No oropharyngeal exudate or posterior oropharyngeal erythema.   Cardiovascular:     Rate and Rhythm: Normal rate and regular rhythm.     Heart sounds: Normal heart sounds, S1 normal and S2 normal. No murmur heard. Pulmonary:     Effort: Pulmonary effort is normal.     Breath sounds: Examination of the right-lower field reveals rales. Examination of the left-lower field reveals decreased breath sounds. Decreased breath sounds and rales present. No wheezing or rhonchi.  Lymphadenopathy:     Head:     Right side of head: No submental, submandibular or tonsillar  adenopathy.     Left side of head: No submental, submandibular or tonsillar adenopathy.     Cervical: No cervical adenopathy.   Psychiatric:        Behavior: Behavior is cooperative.      UC Treatments / Results  Labs (all labs ordered are listed, but only abnormal results are displayed) Labs Reviewed  POCT RAPID STREP A (OFFICE)    EKG   Radiology DG Chest 2 View Result Date: 04/20/2024 CLINICAL DATA:  Worsening cough and shortness of breath. EXAM: CHEST - 2 VIEW COMPARISON:  01/22/2024 FINDINGS: Heart size is normal. Lungs are hyperinflated with chronic coarsened interstitial markings and diffuse bronchial wall thickening. Interstitial prominence is noted with thickening of the peripheral septal markings in the lower lung zones. No consolidative change. No pneumothorax. The visualized osseous  structures are unremarkable. IMPRESSION: 1. Chronic bronchial wall thickening and interstitial prominence compatible with chronic bronchitis. 2. Emphysema. 3. Increased peripheral septal markings within the lower lung zones which may be seen with early pulmonary edema. Electronically Signed   By: Waddell Calk M.D.   On: 04/20/2024 10:50    Procedures Procedures (including critical care time)  Medications Ordered in UC Medications - No data to display  Initial Impression / Assessment and Plan / UC Course  I have reviewed the triage vital signs and the nursing notes.  Pertinent labs & imaging results that were available during my care of the patient were reviewed by me and considered in my medical decision making (see chart for details).     Patient was initially tachypneic and tachycardic but this improved after he sat quietly for several minutes.  COVID testing was deferred as she has been positive within the past 90 days.  Chest x-ray was obtained that did not show any evidence of pneumonia but did show some pulmonary edema.  We discussed that given her worsening symptoms despite  appropriate treatment with strong oral antibiotics and prednisone  the single thing to do is to go to the emergency room but she declined to do this.  Strep testing was obtained and was negative per her request.  We discussed that we do not have a different antibiotic that would be more effective than Levaquin  that we can transition her to and I did not think that this would make much of a difference.  She is to complete the course of Levaquin  that was previously prescribed and use her inhalers as needed.  Her oxygen is at baseline and she does have oxygen at home.  She has a pulse oximeter and will continue to monitor this regularly.  We discussed that if at any point it drops below 90% she would not have a choice but to go to the ER.  We can discuss that the safest thing to do is to go to the emergency room for further evaluation and management but she declined this.  She did agree that if she is not improving or if she has any worsening symptoms she will go directly to the ER.  She was given viscous lidocaine  to help with the sore throat.  We discussed that she should not eat or drink immediately after using this medication.  She can use additional over-the-counter medication including Tylenol  and gargling warm salt water.  She was also given lidocaine  patch to help with her rib pain with coughing.  We discussed the importance of following up with her pulmonologist tomorrow if she does not go to the emergency room overnight.  We discussed at length that my recommendation is to go to the emergency room and reports that she will monitor herself closely and if anything changes or she is not improving quickly she will go to the ER in the meantime plan to follow-up with her specialist tomorrow.  All questions were answered to patient satisfaction.  Final Clinical Impressions(s) / UC Diagnoses   Final diagnoses:  Acute cough  COPD exacerbation (HCC)  Rib pain  Sore throat     Discharge Instructions       As we discussed, the safest thing to do is to go to the emergency room for further evaluation and management.  Monitor your oxygen at home and if this drops below 90% you must go immediately.  If anything worsens you have chest pain, shortness of breath, weakness you need  to go immediately as we discussed.  Assuming your symptoms do not worsen, follow-up with your pulmonologist tomorrow.  Use the lidocaine  patch during the day and then remove this at night.  You can gargle with warm salt water as well as use viscous lidocaine  to help with your sore throat.  Do not eat or drink immediately after using the viscous lidocaine  as it will cause you to choke.  Please have a very low threshold for going to the ER as we discussed because I think that is the safest thing for you to do.     ED Prescriptions     Medication Sig Dispense Auth. Provider   lidocaine  (XYLOCAINE ) 2 % solution Use as directed 15 mLs in the mouth or throat every 6 (six) hours as needed for mouth pain. 100 mL Imonie Tuch K, PA-C   lidocaine  (LIDODERM ) 5 % Place 1 patch onto the skin daily. Remove & Discard patch within 12 hours or as directed by MD 5 patch Indi Willhite K, PA-C      PDMP not reviewed this encounter.   Sherrell Rocky POUR, PA-C 04/20/24 1107

## 2024-04-20 NOTE — ED Triage Notes (Signed)
 Patient to Urgent Care with complaints of left sided sore throat (painful to swallow) / neck pain/ worsening cough.   Symptoms x8 days. Currently taking levaquin  500mg  q day x 10 days (started 6/24) for suspected pneumonia, doesn't feel this is helping.   Using otc cough syrup.

## 2024-04-20 NOTE — Discharge Instructions (Signed)
 As we discussed, the safest thing to do is to go to the emergency room for further evaluation and management.  Monitor your oxygen at home and if this drops below 90% you must go immediately.  If anything worsens you have chest pain, shortness of breath, weakness you need to go immediately as we discussed.  Assuming your symptoms do not worsen, follow-up with your pulmonologist tomorrow.  Use the lidocaine  patch during the day and then remove this at night.  You can gargle with warm salt water as well as use viscous lidocaine  to help with your sore throat.  Do not eat or drink immediately after using the viscous lidocaine  as it will cause you to choke.  Please have a very low threshold for going to the ER as we discussed because I think that is the safest thing for you to do.

## 2024-06-05 ENCOUNTER — Ambulatory Visit: Admission: EM | Admit: 2024-06-05 | Discharge: 2024-06-05 | Disposition: A | Discharge status: Home / Self Care

## 2024-06-05 DIAGNOSIS — B349 Viral infection, unspecified: Secondary | ICD-10-CM

## 2024-06-05 LAB — POC SOFIA SARS ANTIGEN FIA: SARS Coronavirus 2 Ag: NEGATIVE

## 2024-06-05 NOTE — Discharge Instructions (Addendum)
 The COVID is negative.   Take Tylenol  or ibuprofen  as needed for fever or discomfort.  Take plain Mucinex  as needed for congestion.  Rest and keep yourself hydrated.    Follow-up with your primary care provider if your symptoms are not improving.

## 2024-06-05 NOTE — ED Triage Notes (Signed)
 Patient to Urgent Care with complaints of cough/ headaches/ nausea/ sore throat/ diarrhea/ ear pain/ body aches.   Symptoms x2 days. Reports daughter is covid positive.   Taking tylenol  w/ little relief.

## 2024-06-05 NOTE — ED Provider Notes (Signed)
 Catherine Giles    CSN: [id number] Arrival date & time: 06/05/24  1151      History   Chief Complaint Chief Complaint  Patient presents with   Cough   Nausea   Sore Throat    HPI Catherine Giles is a 53 y.o. female.  Patient presents with 2-day history of fever, body aches, ear pain, sore throat, cough, nausea, diarrhea, headache.  She has been treating her symptoms with Tylenol .  She has COPD; No increased sputum production and shortness of breath.  She reports close family member has COVID.     The history is provided by the patient and medical records.    Past Medical History:  Diagnosis Date   Arthritis    RA   Asthma    COPD (chronic obstructive pulmonary disease) (HCC)    Depression    Diabetes mellitus without complication (HCC)    Emphysema of lung (HCC)    Family history of breast cancer    6/21 cancer genetic testing letter sent   Fibromyalgia    Hypertension    Lupus    Pre-diabetes    Thyroid  disease     Patient Active Problem List   Diagnosis Date Noted   Sepsis due to pneumonia (HCC) 10/23/2023   Pneumonia 10/23/2023   Hemoptysis 06/04/2023   Bronchopneumonia 10/09/2022   Influenza A 10/07/2022   Depression with anxiety 10/07/2022   COPD exacerbation (HCC) 10/07/2022   HTN (hypertension) 10/07/2022   Tobacco abuse 10/07/2022   Chest pain 09/10/2022   COPD (chronic obstructive pulmonary disease) (HCC)    Hypothyroidism    Nicotine  dependence, cigarettes, uncomplicated    Chronic diastolic CHF (congestive heart failure) (HCC)    Sepsis (HCC)    Allergy to alpha-gal 01/07/2022   Depression, major, single episode, mild (HCC) 01/24/2021   Dairy allergy 10/12/2020   Vaginal stenosis 03/19/2020   Obesity 03/19/2020   Insomnia 01/05/2020   Complex regional pain syndrome I of upper limb 12/17/2019   Hypertension associated with diabetes (HCC) 12/13/2019   Type II diabetes mellitus with complication (HCC) 12/13/2019   Hashimoto's  thyroiditis 12/13/2019   DDD (degenerative disc disease), lumbar 08/26/2019   Multinodular goiter 08/14/2019   Chronic left hip pain 05/05/2019   Osteoarthritis of spine with radiculopathy, cervical region 05/05/2019   Polypharmacy 02/14/2019   Fibromyalgia 02/10/2019   Rheumatoid arthritis involving multiple sites with positive rheumatoid factor (HCC) 02/10/2019   Cervical radiculopathy 03/13/2018   Vitamin D deficiency 10/11/2015    Past Surgical History:  Procedure Laterality Date   CHOLECYSTECTOMY, LAPAROSCOPIC  02/2022   MOUTH SURGERY     TRANSTHORACIC ECHOCARDIOGRAM  04/2020   EF 60 to 65%.  Normal LV function.  No R WMA.  GR 1 DD.  Normal RV size and function.  Normal PAP.  Normal atrial size.  Normal valves.    OB History     Gravida  9   Para  6   Term  0   Preterm  0   AB  3   Living         SAB  2   IAB  0   Ectopic  0   Multiple      Live Births               Home Medications    Prior to Admission medications   Medication Sig Start Date End Date Taking? Authorizing Provider  busPIRone  (BUSPAR ) 7.5 MG tablet Take 7.5 mg by  mouth 2 (two) times daily. 04/10/24  Yes [provider]  diazepam  (VALIUM ) 5 MG tablet Take 5 mg by mouth 3 (three) times daily as needed. 03/31/24  Yes [provider]  acetaminophen -codeine  (TYLENOL  #3) 300-30 MG tablet Take 1-2 tablets by mouth every 6 (six) hours as needed for moderate pain (pain score 4-6). 03/15/24   White, Shelba SAUNDERS, NP  albuterol  (VENTOLIN  HFA) 108 (90 Base) MCG/ACT inhaler Inhale 1-2 puffs into the lungs every 6 (six) hours as needed for shortness of breath or wheezing.    [provider]  amLODipine  (NORVASC ) 10 MG tablet Take 1 tablet (10 mg total) by mouth daily. 09/05/21   Cannady, Jolene T, NP  amLODipine  (NORVASC ) 5 MG tablet Take 5 mg by mouth 2 (two) times daily.    [provider]  atorvastatin  (LIPITOR) 40 MG tablet Take 40 mg by mouth daily. 10/15/23    [provider]  Blood Glucose Monitoring Suppl (ONETOUCH VERIO) w/Device KIT Use to check blood sugar 3 times daily with goal <130 fasting and <180 two hours after meal.  Bring meter to visits. 01/25/21   Cannady, Jolene T, NP  buPROPion (WELLBUTRIN SR) 150 MG 12 hr tablet Take 150 mg by mouth 2 (two) times daily.    [provider]  cetirizine  (ZYRTEC ) 10 MG tablet Take 1 tablet (10 mg total) by mouth daily as needed for rhinitis or allergies. 10/26/23   Jens Durand, MD  dapagliflozin  propanediol (FARXIGA ) 10 MG TABS tablet Take 1 tablet (10 mg total) by mouth daily before breakfast. Patient not taking: Reported on 04/20/2024 03/16/21   Cannady, Jolene T, NP  DULoxetine  (CYMBALTA ) 60 MG capsule Take 60 mg by mouth daily. 09/12/23   [provider]  EPINEPHrine  0.3 mg/0.3 mL IJ SOAJ injection Inject 0.3 mg into the muscle as needed for anaphylaxis. 02/15/22   Valerio Moris T, NP  glucose blood test strip Use to check blood sugar 3 times daily with goal <130 fasting and <180 two hours after meal. 01/25/21   Cannady, Jolene T, NP  hydroxychloroquine (PLAQUENIL) 200 MG tablet Take 1 tablet by mouth 2 (two) times daily. Patient not taking: Reported on 04/20/2024 03/04/24   [provider]  hydrOXYzine  (VISTARIL ) 25 MG capsule TAKE 1 CAPSULE BY MOUTH EVERY 8 HOURS ASNEEDED Patient taking differently: Take 25 mg by mouth every 8 (eight) hours as needed for anxiety or itching. TAKE 1 CAPSULE BY MOUTH EVERY 8 HOURS ASNEEDED 01/03/22   Daphane Rosella, NP  ipratropium (ATROVENT ) 0.06 % nasal spray Place into both nostrils.    [provider]  Lancets [facility] ULTRASOFT) lancets Use to check blood sugar 3 times daily with goal <130 fasting and <180 two hours after meal. 01/25/21   Cannady, Jolene T, NP  Lancets (ONETOUCH ULTRASOFT) lancets Use as directed 03/21/23   Cyrena Mylar, MD  levothyroxine  (SYNTHROID ) 50 MCG tablet Take 50 mcg by mouth daily before breakfast.      [provider]  lidocaine  (LIDODERM ) 5 % Place 1 patch onto the skin daily. Remove & Discard patch within 12 hours or as directed by MD 04/20/24   Raspet, Rocky POUR, PA-C  lidocaine  (XYLOCAINE ) 2 % solution Use as directed 15 mLs in the mouth or throat every 6 (six) hours as needed for mouth pain. 04/20/24   Raspet, Erin K, PA-C  losartan  (COZAAR ) 100 MG tablet Take 100 mg by mouth daily.    [provider]  metFORMIN  (GLUCOPHAGE -XR) 500 MG 24  hr tablet Take 500 mg by mouth 2 (two) times daily. Patient not taking: Reported on 04/20/2024    [provider]  methylPREDNISolone  (MEDROL  DOSEPAK) 4 MG TBPK tablet SMARTSIG:- Tablet(s) By Mouth - Patient not taking: Reported on 06/05/2024 04/10/24   [provider]  metoprolol  succinate (TOPROL -XL) 25 MG 24 hr tablet Take 1 tablet by mouth daily. 08/27/23 08/26/24  [provider]  montelukast  (SINGULAIR ) 10 MG tablet Take 10 mg by mouth daily. 02/15/22   [provider]  omeprazole (PRILOSEC) 20 MG capsule Take 20 mg by mouth 2 (two) times daily. 08/08/23   [provider]  potassium chloride  (MICRO-K ) 10 MEQ CR capsule Take 10 mEq by mouth daily. Patient not taking: Reported on 04/20/2024 08/27/23   [provider]  RINVOQ 15 MG TB24 Take 1 tablet by mouth daily.    [provider]  DOMINIC BECK 200-62.5-25 MCG/ACT AEPB Inhale 1 puff into the lungs daily. 09/11/22   [provider]  TRUE VITAMIN D3 10 MCG (400 UNIT) CAPS Take 2 capsules by mouth daily. 08/26/23   [provider]  traZODone  (DESYREL ) 50 MG tablet Take 1 tablet (50 mg total) by mouth at bedtime. 01/05/20 07/01/20  Valerio Moris T, NP    Family History Family History  Problem Relation Age of Onset   Breast cancer Mother 59   Lung cancer Mother    Diabetes Mother    Hypertension Father    Cancer Maternal Aunt    Ovarian cancer Maternal Aunt    Diabetes Maternal Aunt    Diabetes Maternal Uncle     Breast cancer Paternal Aunt    Hypertension Maternal Grandmother    Diabetes Maternal Grandmother     Social History Social History   Tobacco Use   Smoking status: Every Day    Current packs/day: 1.00    Average packs/day: 1 pack/day for 34.0 years (34.0 ttl pk-yrs)    Types: Cigarettes   Smokeless tobacco: Never   Tobacco comments:    2-3 cig/day  Vaping Use   Vaping status: Never Used  Substance Use Topics   Alcohol use: Yes    Comment: Occ   Drug use: Not Currently     Allergies   Azithromycin, Molds & smuts, Alpha-gal, Penicillins, and Shellfish allergy   Review of Systems Review of Systems  Constitutional:  Positive for fever. Negative for chills.  HENT:  Positive for ear pain and sore throat.   Respiratory:  Positive for cough.        Shortness of breath at baseline.  Cardiovascular:  Negative for chest pain and palpitations.  Gastrointestinal:  Positive for diarrhea and nausea. Negative for abdominal pain and vomiting.     Physical Exam Triage Vital Signs ED Triage Vitals [06/05/24 1234]  Encounter Vitals Group     BP 131/89     Girls Systolic BP Percentile      Girls Diastolic BP Percentile      Boys Systolic BP Percentile      Boys Diastolic BP Percentile      Pulse Rate 100     Resp 18     Temp 98.2 F (36.8 C)     Temp src      SpO2 96 %     Weight      Height      Head Circumference      Peak Flow      Pain Score      Pain Loc  Pain Education      Exclude from Growth Chart    No data found.  Updated Vital Signs BP 131/89   Pulse 100   Temp 98.2 F (36.8 C)   Resp 18   SpO2 96%   Visual Acuity Right Eye Distance:   Left Eye Distance:   Bilateral Distance:    Right Eye Near:   Left Eye Near:    Bilateral Near:     Physical Exam Constitutional:      General: She is not in acute distress. HENT:     Right Ear: Tympanic membrane normal.     Left Ear: Tympanic membrane normal.     Nose: Nose normal.      Mouth/Throat:     Mouth: Mucous membranes are moist.     Pharynx: Oropharynx is clear.  Cardiovascular:     Rate and Rhythm: Normal rate and regular rhythm.     Heart sounds: Normal heart sounds.  Pulmonary:     Effort: Pulmonary effort is normal. No respiratory distress.     Breath sounds: Normal breath sounds.  Abdominal:     General: Bowel sounds are normal.     Palpations: Abdomen is soft.     Tenderness: There is no abdominal tenderness. There is no guarding or rebound.  Neurological:     Mental Status: She is alert.      UC Treatments / Results  Labs (all labs ordered are listed, but only abnormal results are displayed) Labs Reviewed  POC SOFIA SARS ANTIGEN FIA    EKG   Radiology No results found.  Procedures Procedures (including critical care time)  Medications Ordered in UC Medications - No data to display  Initial Impression / Assessment and Plan / UC Course  I have reviewed the triage vital signs and the nursing notes.  Pertinent labs & imaging results that were available during my care of the patient were reviewed by me and considered in my medical decision making (see chart for details).    Viral illness.  Afebrile and vital signs are stable.  O2 sat 96% on room air.  Patient reports no increased sputum production or increased shortness of breath from her baseline.  Rapid COVID is negative.  Instructed patient to continue symptomatic treatment.  Education provided on viral illness.  Instructed her to follow-up with her PCP.  ED precautions given.  She agrees to plan of care.  Final Clinical Impressions(s) / UC Diagnoses   Final diagnoses:  Viral illness     Discharge Instructions      The COVID is negative.   Take Tylenol  or ibuprofen  as needed for fever or discomfort.  Take plain Mucinex  as needed for congestion.  Rest and keep yourself hydrated.    Follow-up with your primary care provider if your symptoms are not improving.         ED  Prescriptions   None    PDMP not reviewed this encounter.   Corlis Burnard DEL, NP 06/05/24 1311

## 2024-06-23 ENCOUNTER — Ambulatory Visit: Payer: Self-pay

## 2024-07-30 ENCOUNTER — Other Ambulatory Visit: Payer: Self-pay | Admitting: *Deleted

## 2024-07-30 ENCOUNTER — Encounter: Payer: Self-pay | Admitting: *Deleted

## 2024-07-30 DIAGNOSIS — F17210 Nicotine dependence, cigarettes, uncomplicated: Secondary | ICD-10-CM

## 2024-07-30 DIAGNOSIS — Z122 Encounter for screening for malignant neoplasm of respiratory organs: Secondary | ICD-10-CM

## 2024-07-30 DIAGNOSIS — Z87891 Personal history of nicotine dependence: Secondary | ICD-10-CM

## 2024-10-24 ENCOUNTER — Encounter: Payer: Self-pay | Admitting: Emergency Medicine

## 2024-10-24 ENCOUNTER — Ambulatory Visit
Admission: EM | Admit: 2024-10-24 | Discharge: 2024-10-24 | Disposition: A | Discharge status: Home / Self Care | Attending: Physician Assistant | Admitting: Physician Assistant

## 2024-10-24 DIAGNOSIS — R051 Acute cough: Secondary | ICD-10-CM | POA: Diagnosis not present

## 2024-10-24 DIAGNOSIS — J441 Chronic obstructive pulmonary disease with (acute) exacerbation: Secondary | ICD-10-CM | POA: Diagnosis not present

## 2024-10-24 DIAGNOSIS — J029 Acute pharyngitis, unspecified: Secondary | ICD-10-CM

## 2024-10-24 LAB — POCT INFLUENZA A/B
Influenza A, POC: NEGATIVE
Influenza B, POC: NEGATIVE

## 2024-10-24 LAB — POC SOFIA SARS ANTIGEN FIA: SARS Coronavirus 2 Ag: NEGATIVE

## 2024-10-24 LAB — POCT RAPID STREP A (OFFICE): Rapid Strep A Screen: NEGATIVE

## 2024-10-24 MED ORDER — PROMETHAZINE-DM 6.25-15 MG/5ML PO SYRP: 5.0000 mL | ORAL_SOLUTION | Freq: Four times a day (QID) | ORAL | 0 refills | Status: AC | PRN

## 2024-10-24 MED ORDER — PREDNISONE 20 MG PO TABS: 40.0000 mg | ORAL_TABLET | Freq: Every day | ORAL | 0 refills | Status: AC

## 2024-10-24 MED ORDER — DOXYCYCLINE HYCLATE 100 MG PO CAPS: 100.0000 mg | ORAL_CAPSULE | Freq: Two times a day (BID) | ORAL | 0 refills | Status: AC

## 2024-10-24 NOTE — ED Triage Notes (Signed)
 Patient c/o cough, congestion, runny nose, bodyaches and sore throat that started on Wed.  Patient reports fevers.

## 2024-10-24 NOTE — Discharge Instructions (Addendum)
-   Negative Strep, COVID and flu. - I sent you an antibiotic, corticosteroid and cough medicine. - Continue breathing treatments at home, increasing rest and fluids. - If return of fever, worsening cough, increased breathing difficulty return or go to ER.

## 2024-10-24 NOTE — ED Provider Notes (Signed)
 " [facility]    CSN: [id number] Arrival date & time: 10/24/24  1223      History   Chief Complaint Chief Complaint  Patient presents with   Cough   Sore Throat    HPI Catherine Giles is a 54 y.o. female with history of asthma and COPD as well as lupus, hypertension, fibromyalgia and diabetes.  Patient presents today for 3 day history of increased shortness of breath, cough, chest pain when coughing, fatigue, sore throat, and nasal congestion. Fever 3 days ago but not recently. She has used albuterol  and it has helped--breathing treatment done before visit today.   Her daughter is also ill. No other concerns.  HPI  Past Medical History:  Diagnosis Date   Arthritis    RA   Asthma    COPD (chronic obstructive pulmonary disease) (HCC)    Depression    Diabetes mellitus without complication (HCC)    Emphysema of lung (HCC)    Family history of breast cancer    6/21 cancer genetic testing letter sent   Fibromyalgia    Hypertension    Lupus    Pre-diabetes    Thyroid  disease     Patient Active Problem List   Diagnosis Date Noted   Sepsis due to pneumonia (HCC) 10/23/2023   Pneumonia 10/23/2023   Hemoptysis 06/04/2023   Bronchopneumonia 10/09/2022   Influenza A 10/07/2022   Depression with anxiety 10/07/2022   COPD exacerbation (HCC) 10/07/2022   HTN (hypertension) 10/07/2022   Tobacco abuse 10/07/2022   Chest pain 09/10/2022   COPD (chronic obstructive pulmonary disease) (HCC)    Hypothyroidism    Nicotine  dependence, cigarettes, uncomplicated    Chronic diastolic CHF (congestive heart failure) (HCC)    Sepsis (HCC)    Allergy to alpha-gal 01/07/2022   Depression, major, single episode, mild 01/24/2021   Dairy allergy 10/12/2020   Vaginal stenosis 03/19/2020   Obesity 03/19/2020   Insomnia 01/05/2020   Complex regional pain syndrome I of upper limb 12/17/2019   Hypertension associated with diabetes (HCC) 12/13/2019   Type II diabetes mellitus  with complication (HCC) 12/13/2019   Hashimoto's thyroiditis 12/13/2019   DDD (degenerative disc disease), lumbar 08/26/2019   Multinodular goiter 08/14/2019   Chronic left hip pain 05/05/2019   Osteoarthritis of spine with radiculopathy, cervical region 05/05/2019   Polypharmacy 02/14/2019   Fibromyalgia 02/10/2019   Rheumatoid arthritis involving multiple sites with positive rheumatoid factor (HCC) 02/10/2019   Cervical radiculopathy 03/13/2018   Vitamin D deficiency 10/11/2015    Past Surgical History:  Procedure Laterality Date   CHOLECYSTECTOMY, LAPAROSCOPIC  02/2022   MOUTH SURGERY     TRANSTHORACIC ECHOCARDIOGRAM  04/2020   EF 60 to 65%.  Normal LV function.  No R WMA.  GR 1 DD.  Normal RV size and function.  Normal PAP.  Normal atrial size.  Normal valves.    OB History     Gravida  9   Para  6   Term  0   Preterm  0   AB  3   Living         SAB  2   IAB  0   Ectopic  0   Multiple      Live Births               Home Medications    Prior to Admission medications  Medication Sig Start Date End Date Taking? Authorizing Provider  doxycycline  (VIBRAMYCIN ) 100 MG capsule Take  1 capsule (100 mg total) by mouth 2 (two) times daily for 7 days. 10/24/24 10/31/24 Yes Arvis Jolan NOVAK, PA-C  predniSONE  (DELTASONE ) 20 MG tablet Take 2 tablets (40 mg total) by mouth daily for 5 days. 10/24/24 10/29/24 Yes Arvis Jolan B, PA-C  promethazine -dextromethorphan  (PROMETHAZINE -DM) 6.25-15 MG/5ML syrup Take 5 mLs by mouth 4 (four) times daily as needed. 10/24/24  Yes Arvis Jolan NOVAK, PA-C  acetaminophen -codeine  (TYLENOL  #3) 300-30 MG tablet Take 1-2 tablets by mouth every 6 (six) hours as needed for moderate pain (pain score 4-6). 03/15/24   Teresa Shelba SAUNDERS, NP  albuterol  (VENTOLIN  HFA) 108 (90 Base) MCG/ACT inhaler Inhale 1-2 puffs into the lungs every 6 (six) hours as needed for shortness of breath or wheezing.    [provider]  amLODipine  (NORVASC ) 10 MG tablet  Take 1 tablet (10 mg total) by mouth daily. 09/05/21   Cannady, Jolene T, NP  amLODipine  (NORVASC ) 5 MG tablet Take 5 mg by mouth 2 (two) times daily.    [provider]  atorvastatin  (LIPITOR) 40 MG tablet Take 40 mg by mouth daily. 10/15/23   [provider]  Blood Glucose Monitoring Suppl (ONETOUCH VERIO) w/Device KIT Use to check blood sugar 3 times daily with goal <130 fasting and <180 two hours after meal.  Bring meter to visits. 01/25/21   Cannady, Jolene T, NP  buPROPion (WELLBUTRIN SR) 150 MG 12 hr tablet Take 150 mg by mouth 2 (two) times daily.    [provider]  busPIRone  (BUSPAR ) 7.5 MG tablet Take 7.5 mg by mouth 2 (two) times daily. 04/10/24   [provider]  cetirizine  (ZYRTEC ) 10 MG tablet Take 1 tablet (10 mg total) by mouth daily as needed for rhinitis or allergies. 10/26/23   Jens Durand, MD  dapagliflozin  propanediol (FARXIGA ) 10 MG TABS tablet Take 1 tablet (10 mg total) by mouth daily before breakfast. Patient not taking: Reported on 04/20/2024 03/16/21   Cannady, Jolene T, NP  diazepam  (VALIUM ) 5 MG tablet Take 5 mg by mouth 3 (three) times daily as needed. 03/31/24   [provider]  DULoxetine  (CYMBALTA ) 60 MG capsule Take 60 mg by mouth daily. 09/12/23   [provider]  EPINEPHrine  0.3 mg/0.3 mL IJ SOAJ injection Inject 0.3 mg into the muscle as needed for anaphylaxis. 02/15/22   Valerio Moris T, NP  glucose blood test strip Use to check blood sugar 3 times daily with goal <130 fasting and <180 two hours after meal. 01/25/21   Cannady, Jolene T, NP  hydroxychloroquine (PLAQUENIL) 200 MG tablet Take 1 tablet by mouth 2 (two) times daily. Patient not taking: Reported on 04/20/2024 03/04/24   [provider]  hydrOXYzine  (VISTARIL ) 25 MG capsule TAKE 1 CAPSULE BY MOUTH EVERY 8 HOURS ASNEEDED Patient taking differently: Take 25 mg by mouth every 8 (eight) hours as needed for anxiety or itching. TAKE 1 CAPSULE BY MOUTH  EVERY 8 HOURS ASNEEDED 01/03/22   Daphane Rosella, NP  ipratropium (ATROVENT ) 0.06 % nasal spray Place into both nostrils.    [provider]  Lancets [facility] ULTRASOFT) lancets Use to check blood sugar 3 times daily with goal <130 fasting and <180 two hours after meal. 01/25/21   Cannady, Jolene T, NP  Lancets (ONETOUCH ULTRASOFT) lancets Use as directed 03/21/23   Cyrena Mylar, MD  levothyroxine  (SYNTHROID ) 50 MCG tablet Take 50 mcg by mouth daily before breakfast.     [provider]  lidocaine  (LIDODERM ) 5 % Place 1  patch onto the skin daily. Remove & Discard patch within 12 hours or as directed by MD 04/20/24   Raspet, Rocky K, PA-C  lidocaine  (XYLOCAINE ) 2 % solution Use as directed 15 mLs in the mouth or throat every 6 (six) hours as needed for mouth pain. 04/20/24   Raspet, Rocky POUR, PA-C  losartan  (COZAAR ) 100 MG tablet Take 100 mg by mouth daily.    [provider]  metFORMIN  (GLUCOPHAGE -XR) 500 MG 24 hr tablet Take 500 mg by mouth 2 (two) times daily. Patient not taking: Reported on 04/20/2024    [provider]  metoprolol  succinate (TOPROL -XL) 25 MG 24 hr tablet Take 1 tablet by mouth daily. 08/27/23 08/26/24  [provider]  montelukast  (SINGULAIR ) 10 MG tablet Take 10 mg by mouth daily. 02/15/22   [provider]  omeprazole (PRILOSEC) 20 MG capsule Take 20 mg by mouth 2 (two) times daily. 08/08/23   [provider]  potassium chloride  (MICRO-K ) 10 MEQ CR capsule Take 10 mEq by mouth daily. Patient not taking: Reported on 04/20/2024 08/27/23   [provider]  RINVOQ 15 MG TB24 Take 1 tablet by mouth daily.    [provider]  DOMINIC BECK 200-62.5-25 MCG/ACT AEPB Inhale 1 puff into the lungs daily. 09/11/22   [provider]  TRUE VITAMIN D3 10 MCG (400 UNIT) CAPS Take 2 capsules by mouth daily. 08/26/23   [provider]  traZODone  (DESYREL ) 50 MG tablet Take 1 tablet (50 mg total) by mouth at  bedtime. 01/05/20 07/01/20  Valerio Moris T, NP    Family History Family History  Problem Relation Age of Onset   Breast cancer Mother 54   Lung cancer Mother    Diabetes Mother    Hypertension Father    Cancer Maternal Aunt    Ovarian cancer Maternal Aunt    Diabetes Maternal Aunt    Diabetes Maternal Uncle    Breast cancer Paternal Aunt    Hypertension Maternal Grandmother    Diabetes Maternal Grandmother     Social History Social History   Tobacco Use   Smoking status: Every Day    Current packs/day: 1.00    Average packs/day: 1 pack/day for 34.0 years (34.0 ttl pk-yrs)    Types: Cigarettes   Smokeless tobacco: Never   Tobacco comments:    2-3 cig/day  Vaping Use   Vaping status: Never Used  Substance Use Topics   Alcohol use: Yes    Comment: Occ   Drug use: Not Currently     Allergies   Azithromycin, Molds & smuts, Alpha-gal, Penicillins, and Shellfish allergy   Review of Systems Review of Systems  Constitutional:  Positive for fatigue and fever (resolved). Negative for chills and diaphoresis.  HENT:  Positive for congestion, ear pain, rhinorrhea and sore throat. Negative for sinus pain.   Respiratory:  Positive for cough and shortness of breath.   Cardiovascular:  Positive for chest pain.  Gastrointestinal:  Negative for abdominal pain, diarrhea, nausea and vomiting.  Musculoskeletal:  Positive for myalgias.  Skin:  Negative for rash.  Neurological:  Positive for headaches. Negative for weakness.  Hematological:  Negative for adenopathy.     Physical Exam Triage Vital Signs  No data found.  Updated Vital Signs BP (!) 150/86 (BP Location: Right Arm)   Pulse (!) 111   Temp 98.1 F (36.7 C) (Oral)   Resp 16   Ht 5' 3 (1.6 m)   Wt 171 lb 11.2 oz (77.9  kg)   SpO2 94%   BMI 30.42 kg/m   Physical Exam Vitals and nursing note reviewed.  Constitutional:      General: She is not in acute distress.    Appearance: Normal appearance. She is  ill-appearing. She is not toxic-appearing.  HENT:     Head: Normocephalic and atraumatic.     Right Ear: Ear canal and external ear normal. A middle ear effusion is present.     Left Ear: Tympanic membrane, ear canal and external ear normal.     Nose: Congestion present.     Mouth/Throat:     Mouth: Mucous membranes are moist.     Pharynx: Oropharynx is clear. Posterior oropharyngeal erythema present.  Eyes:     General: No scleral icterus.       Right eye: No discharge.        Left eye: No discharge.     Conjunctiva/sclera: Conjunctivae normal.  Cardiovascular:     Rate and Rhythm: Normal rate and regular rhythm.     Heart sounds: Normal heart sounds.  Pulmonary:     Effort: Pulmonary effort is normal. No respiratory distress.     Breath sounds: No wheezing, rhonchi or rales.  Musculoskeletal:     Cervical back: Neck supple.  Skin:    General: Skin is dry.  Neurological:     General: No focal deficit present.     Mental Status: She is alert. Mental status is at baseline.     Motor: No weakness.     Gait: Gait normal.  Psychiatric:        Mood and Affect: Mood normal. Affect is tearful.        Behavior: Behavior normal.      UC Treatments / Results  Labs (all labs ordered are listed, but only abnormal results are displayed) Labs Reviewed  POCT RAPID STREP A (OFFICE) - Normal  POC SOFIA SARS ANTIGEN FIA - Normal  POCT INFLUENZA A/B - Normal     EKG   Radiology No results found.  Procedures Procedures (including critical care time)  Medications Ordered in UC Medications - No data to display  Initial Impression / Assessment and Plan / UC Course  I have reviewed the triage vital signs and the nursing notes.  Pertinent labs & imaging results that were available during my care of the patient were reviewed by me and considered in my medical decision making (see chart for details).  54 year old female with history of asthma, COPD, lupus, hypertension is  presenting for increased fatigue, cough, congestion, shortness of breath, chest pain, and headaches for the past 3 days.   Patient is afebrile. O2 is 94%. She is ill-appearing but nontoxic.  She is tearful. Mild nasal congestion and erythema of posterior pharynx. Chest is clear. Heart RRR.   Negative strep, COVID and flu.  Advised patient symptoms most consistent with viral illness, possibly influenza but she is test negative at this time.  She is concern for worsening cough productive of yellowish sputum that is blood-tinged to time and increased shortness of breath.  Believes she is having a COPD flareup.  Mild COPD exacerbation. Patient has allergies to PCN and azithromycin. Will treat with doxycycline  at this time, prednisone  taper and Promethazine  DM. Encouraged her to increase rest and fluids.  Advised to perform breathing treatments as needed. Discussed return and ED precautions.  Acute illness with systemic symptoms and flareup of chronic underlying condition.  Final Clinical Impressions(s) / UC Diagnoses   Final  diagnoses:  COPD exacerbation (HCC)  Acute cough  Sore throat     Discharge Instructions      - Negative Strep, COVID and flu. - I sent you an antibiotic, corticosteroid and cough medicine. - Continue breathing treatments at home, increasing rest and fluids. - If return of fever, worsening cough, increased breathing difficulty return or go to ER.       ED Prescriptions     Medication Sig Dispense Auth. Provider   doxycycline  (VIBRAMYCIN ) 100 MG capsule Take 1 capsule (100 mg total) by mouth 2 (two) times daily for 7 days. 14 capsule Arvis Huxley B, PA-C   predniSONE  (DELTASONE ) 20 MG tablet Take 2 tablets (40 mg total) by mouth daily for 5 days. 10 tablet Arvis Huxley B, PA-C   promethazine -dextromethorphan  (PROMETHAZINE -DM) 6.25-15 MG/5ML syrup Take 5 mLs by mouth 4 (four) times daily as needed. 118 mL Arvis Huxley NOVAK, PA-C      PDMP not reviewed this  encounter.      Arvis Huxley NOVAK, PA-C 10/24/24 1449  "
